# Patient Record
Sex: Male | Born: 1940 | Race: White | Hispanic: No | Marital: Married | Smoking: Former smoker
Health system: Southern US, Community
[De-identification: ages and names within clinical notes are randomized; demographics above are authoritative.]

## PROBLEM LIST (undated history)

## (undated) DIAGNOSIS — I10 Essential (primary) hypertension: Secondary | ICD-10-CM

## (undated) DIAGNOSIS — I2699 Other pulmonary embolism without acute cor pulmonale: Secondary | ICD-10-CM

## (undated) DIAGNOSIS — M1990 Unspecified osteoarthritis, unspecified site: Secondary | ICD-10-CM

## (undated) DIAGNOSIS — H9190 Unspecified hearing loss, unspecified ear: Secondary | ICD-10-CM

## (undated) DIAGNOSIS — F329 Major depressive disorder, single episode, unspecified: Secondary | ICD-10-CM

## (undated) DIAGNOSIS — F32A Depression, unspecified: Secondary | ICD-10-CM

## (undated) DIAGNOSIS — S82899A Other fracture of unspecified lower leg, initial encounter for closed fracture: Secondary | ICD-10-CM

## (undated) DIAGNOSIS — E785 Hyperlipidemia, unspecified: Secondary | ICD-10-CM

## (undated) HISTORY — DX: Hyperlipidemia, unspecified: E78.5

## (undated) HISTORY — DX: Other pulmonary embolism without acute cor pulmonale: I26.99

## (undated) HISTORY — DX: Essential (primary) hypertension: I10

## (undated) HISTORY — PX: TONSILLECTOMY: SUR1361

---

## 1999-03-14 ENCOUNTER — Encounter: Payer: Self-pay | Admitting: Family Medicine

## 1999-03-14 ENCOUNTER — Encounter: Admission: RE | Admit: 1999-03-14 | Discharge: 1999-03-14 | Payer: Self-pay | Admitting: Family Medicine

## 2002-09-30 ENCOUNTER — Encounter: Admission: RE | Admit: 2002-09-30 | Discharge: 2002-09-30 | Payer: Self-pay | Admitting: Family Medicine

## 2002-09-30 ENCOUNTER — Encounter: Payer: Self-pay | Admitting: Family Medicine

## 2003-03-03 ENCOUNTER — Ambulatory Visit (HOSPITAL_COMMUNITY): Admission: RE | Admit: 2003-03-03 | Discharge: 2003-03-03 | Payer: Self-pay | Admitting: Gastroenterology

## 2004-02-06 DIAGNOSIS — S82899A Other fracture of unspecified lower leg, initial encounter for closed fracture: Secondary | ICD-10-CM

## 2004-02-06 HISTORY — DX: Other fracture of unspecified lower leg, initial encounter for closed fracture: S82.899A

## 2004-08-14 ENCOUNTER — Emergency Department (HOSPITAL_COMMUNITY): Admission: EM | Admit: 2004-08-14 | Discharge: 2004-08-14 | Payer: Self-pay | Admitting: Family Medicine

## 2004-10-02 ENCOUNTER — Encounter: Admission: RE | Admit: 2004-10-02 | Discharge: 2004-10-02 | Payer: Self-pay | Admitting: Family Medicine

## 2004-10-02 ENCOUNTER — Observation Stay (HOSPITAL_COMMUNITY): Admission: EM | Admit: 2004-10-02 | Discharge: 2004-10-03 | Payer: Self-pay | Admitting: *Deleted

## 2005-01-05 ENCOUNTER — Ambulatory Visit: Payer: Self-pay | Admitting: Sports Medicine

## 2005-07-17 ENCOUNTER — Ambulatory Visit: Payer: Self-pay | Admitting: Family Medicine

## 2005-10-09 ENCOUNTER — Ambulatory Visit: Payer: Self-pay | Admitting: Sports Medicine

## 2005-10-09 ENCOUNTER — Encounter: Admission: RE | Admit: 2005-10-09 | Discharge: 2005-10-09 | Payer: Self-pay | Admitting: Sports Medicine

## 2005-11-20 ENCOUNTER — Ambulatory Visit: Payer: Self-pay | Admitting: Sports Medicine

## 2006-01-18 ENCOUNTER — Ambulatory Visit: Payer: Self-pay | Admitting: Oncology

## 2006-02-08 ENCOUNTER — Ambulatory Visit: Payer: Self-pay | Admitting: Sports Medicine

## 2006-03-04 ENCOUNTER — Ambulatory Visit: Payer: Self-pay | Admitting: Oncology

## 2006-03-07 LAB — CBC WITH DIFFERENTIAL/PLATELET
Eosinophils Absolute: 0.1 10*3/uL (ref 0.0–0.5)
MCV: 87.2 fL (ref 81.6–98.0)
MONO%: 7.1 % (ref 0.0–13.0)
NEUT#: 3.9 10*3/uL (ref 1.5–6.5)
RBC: 4.52 10*6/uL (ref 4.20–5.71)
RDW: 11.2 % (ref 11.2–14.6)
WBC: 6.8 10*3/uL (ref 4.0–10.0)
lymph#: 2.3 10*3/uL (ref 0.9–3.3)

## 2006-03-12 LAB — COMPREHENSIVE METABOLIC PANEL
AST: 22 U/L (ref 0–37)
Albumin: 4.3 g/dL (ref 3.5–5.2)
Alkaline Phosphatase: 61 U/L (ref 39–117)
Glucose, Bld: 138 mg/dL — ABNORMAL HIGH (ref 70–99)
Potassium: 3.9 mEq/L (ref 3.5–5.3)
Sodium: 142 mEq/L (ref 135–145)
Total Protein: 7 g/dL (ref 6.0–8.3)

## 2006-03-12 LAB — HYPERCOAGULABLE PANEL, COMPREHENSIVE
Beta-2 Glyco I IgG: <4 U/mL (ref <20)
DRVVT 1:1 Mix: 41.5 secs (ref 31.9–44.2)
Homocysteine: 8.4 umol/L (ref 4.0–15.4)
PTT Lupus Anticoagulant: 49.5 secs — ABNORMAL HIGH (ref 36.3–48.8)
Protein C Activity: 122 % (ref 91–147)
Protein C, Total: 99 % (ref 70–140)
Protein S Ag, Total: 129 % (ref 70–140)

## 2006-07-10 ENCOUNTER — Ambulatory Visit: Payer: Self-pay | Admitting: Sports Medicine

## 2006-07-10 DIAGNOSIS — M169 Osteoarthritis of hip, unspecified: Secondary | ICD-10-CM

## 2007-02-21 ENCOUNTER — Encounter: Admission: RE | Admit: 2007-02-21 | Discharge: 2007-02-21 | Payer: Self-pay | Admitting: Family Medicine

## 2007-04-30 ENCOUNTER — Ambulatory Visit: Payer: Self-pay | Admitting: Sports Medicine

## 2007-04-30 DIAGNOSIS — M25569 Pain in unspecified knee: Secondary | ICD-10-CM

## 2008-04-27 ENCOUNTER — Ambulatory Visit: Payer: Self-pay | Admitting: Sports Medicine

## 2008-12-14 ENCOUNTER — Ambulatory Visit: Payer: Self-pay | Admitting: Sports Medicine

## 2008-12-14 DIAGNOSIS — M25519 Pain in unspecified shoulder: Secondary | ICD-10-CM | POA: Insufficient documentation

## 2008-12-14 DIAGNOSIS — M67919 Unspecified disorder of synovium and tendon, unspecified shoulder: Secondary | ICD-10-CM | POA: Insufficient documentation

## 2008-12-14 DIAGNOSIS — M719 Bursopathy, unspecified: Secondary | ICD-10-CM

## 2009-01-11 ENCOUNTER — Ambulatory Visit: Payer: Self-pay | Admitting: Sports Medicine

## 2009-01-11 DIAGNOSIS — M75120 Complete rotator cuff tear or rupture of unspecified shoulder, not specified as traumatic: Secondary | ICD-10-CM

## 2009-02-22 ENCOUNTER — Ambulatory Visit: Payer: Self-pay | Admitting: Sports Medicine

## 2009-04-26 ENCOUNTER — Ambulatory Visit: Payer: Self-pay | Admitting: Sports Medicine

## 2009-09-14 ENCOUNTER — Ambulatory Visit: Payer: Self-pay | Admitting: Sports Medicine

## 2009-11-17 ENCOUNTER — Encounter: Admission: RE | Admit: 2009-11-17 | Discharge: 2009-11-17 | Payer: Self-pay | Admitting: Family Medicine

## 2010-03-07 NOTE — Assessment & Plan Note (Signed)
Summary: f/u,mc   Vital Signs:  Patient profile:   70 year old male Height:      70 inches Weight:      193 pounds BMI:     27.79 BP sitting:   149 / 83  Vitals Entered By: Lillia Pauls CMA (February 22, 2009 9:04 AM)   History of Present Illness: Clearly has continued to make progress With 2 to 3 tramadol per day pain is controlled to where he does his actiivty fine elevatin of shoulder has returned without pain one persistent area of pain and weakness is trying to reach behind his back  note scan showed Supspin tear no new sxs sleeps thru nite  Allergies (verified): No Known Drug Allergies  Physical Exam  General:  Well-developed,well-nourished,in no acute distress; alert,appropriate and cooperative throughout examination Msk:  Inspection reveals no abnormalities or assymetry; no atrophy noted; palpation is unremarkable;   ROM is full in all planes with exception of back scratch which is to T12 RT and T 7 left feels pain on ant shoulder with this . specific strength testing of Rotator cuff mm reveals good strength throughout; evenwith pushoff although this is painful somewhat no signs of impingement although with resistance he feels mild pain   speeds and yergason's tests normal;  no labral pathology noted; norm scapular function observed.  negative painful arc and no drop arm sign.    Impression & Recommendations:  Problem # 1:  SHOULDER PAIN, RIGHT (ICD-719.41)  His updated medication list for this problem includes:    Tramadol Hcl 50 Mg Tabs (Tramadol hcl) .Marland Kitchen... 1 by mouth qid    Mobic 15 Mg Tabs (Meloxicam) .Marland Kitchen... 1 by mouth qd    Anacin 81 Mg Tbec (Aspirin) .Marland Kitchen... 1 by mouth qd  improved and usually gets by with 2 tramadol daily but better with 3  Problem # 2:  ROTATOR CUFF TEAR (ICD-727.61) functionally making a good recovery no sign of frozen shoulder on exam will add therabean exercises with stand RC protocol do these consistently  will reck shoulder  and repeat US in March to April to see if tear is stable  Complete Medication List: 1)  Tramadol Hcl 50 Mg Tabs (Tramadol hcl) .Marland Kitchen.. 1 by mouth qid 2)  Mobic 15 Mg Tabs (Meloxicam) .Marland Kitchen.. 1 by mouth qd 3)  Simvastatin 40 Mg Tabs (Simvastatin) .Marland Kitchen.. 1 by mouth qhs 4)  Fish Oil 1000 Mg Caps (Omega-3 fatty acids) .Marland Kitchen.. 1 by mouth bid 5)  Anacin 81 Mg Tbec (Aspirin) .Marland Kitchen.. 1 by mouth qd 6)  Multivitamins Tabs (Multiple vitamin) .Marland Kitchen.. 1 by mouth qd

## 2010-03-07 NOTE — Assessment & Plan Note (Signed)
Summary: FU SHOULDER/MJD   Vital Signs:  Patient profile:   70 year old male BP sitting:   143 / 88  Vitals Entered By: Lillia Pauls CMA (September 14, 2009 9:05 AM)  History of Present Illness: 70 yo M f/u R shoudler pain and RC tear. Tear previously seen on Korea at supraspintus (well calcified last US exam), Subscap (well healed last Korea), and infraspinatus (some persistence). States overall doing ok, has good and bad days. Slacking off on doing shoulder exercises. Reaching back still most painful, but better. Overall, pain much better than 1 year ago when first had onset of pain. Still using his arms/shoudlers alot and feels he has good strength. L shoulder hurting some as well. Using meloxicam qd and tramadol bid-tid.  Pain better when does tramadol three times a day. Able lay on that side.   Allergies: No Known Drug Allergies   Shoulder/Elbow Exam  General:    Well-developed, well-nourished, normal body habitus; no deformities, normal grooming.    Shoulder Exam:    Shoulder: Inspection reveals no abnormalities, atrophy or asymmetry. Palpation is normal with no tenderness over AC joint or bicipital groove. ROM is full in all planes except IR where he gets to L4-5 on L shoulder, as opposed to T12 on R shoulder. Rotator cuff strength normal throughout. No signs of impingement with negative Neer and Hawkin's tests, empty can. Speeds and Yergason's tests normal. No labral pathology noted with negative Obrien's, negative clunk and good stability. Normal scapular function observed. No painful arc and no drop arm sign. No apprehension sign   MSK limited  US L shoulder: Supraspinatus with good calcification and no impingement under acromion.  Subscap with mild edema, but no large tear or impingement on corocoid.  Infraspinatus with much smaller tear down to 0.39 cm length (prev 0.9 cm), still with some persistent fluid accumulation/bursitis.   Impression &  Recommendations:  Problem # 1:  ROTATOR CUFF TEAR (ICD-727.61) Assessment Improved  Overall, patient much improved clinically and on Korea - continue meloxicam and tramadol - continue home RC stretches and strengthening exercises, specifically discussed crossover stretch to help with decreased IR ROM - RTC as needed for CSI if desired or if much increased weakness  Time spent >25 minutes in direct care of this patient, over half in regards to counseling and ultrasounding  Orders: [place] LIMITED ([id number])  Complete Medication List: 1)  Tramadol Hcl 50 Mg Tabs (Tramadol hcl) .Marland Kitchen.. 1 by mouth qid 2)  Mobic 15 Mg Tabs (Meloxicam) .Marland Kitchen.. 1 by mouth qd 3)  Simvastatin 40 Mg Tabs (Simvastatin) .Marland Kitchen.. 1 by mouth qhs 4)  Fish Oil 1000 Mg Caps (Omega-3 fatty acids) .Marland Kitchen.. 1 by mouth bid 5)  Anacin 81 Mg Tbec (Aspirin) .Marland Kitchen.. 1 by mouth qd 6)  Multivitamins Tabs (Multiple vitamin) .Marland Kitchen.. 1 by mouth qd

## 2010-03-07 NOTE — Assessment & Plan Note (Signed)
Summary: U/S SHOULDER,MC   Vital Signs:  Patient profile:   70 year old male BP sitting:   140 / 74  Vitals Entered By: Enid Baas MD (April 26, 2009 10:58 AM)  History of Present Illness: follow of Rotator cuff tear this was mostly in supraspinatus - 2cms 1 cm tear in infraspinatus  has done exercises most days but not on hard work days using hammer sometimes hurts in harder positions  pain no longer at nite pain is less with almost all motions now pain and aching seems similar in both shoulders and no real difference in strength  Allergies: No Known Drug Allergies  Physical Exam  General:  Well-developed,well-nourished,in no acute distress; alert,appropriate and cooperative throughout examination Msk:  Inspection reveals no abnormalities or assymetry; no atrophy noted; palpation is unremarkable;  ROM is full in all planes. specific strength testing of Rotator cuff mm reveals good strength throughout; no signs of impingement; speeds and yergason's tests normal;  no labral pathology noted; norm scapular function observed.  negative painful arc and no drop arm sign.  only remaining change is back scratch is 2 vertebrae less on RT but 2 better than last exam  strg better on IR than before  Additional Exam:  MSK Korea now on scanning the tear in the supraspinatus looks healed and the area of tear is now calcified no impingement noted on testing subscapularis shows some edema, maybe some slt tear inferiorly no impingement and 90% intact infraspinatus shows that tear persists and is about same - measures 0.9cms today  images saved   Impression & Recommendations:  Problem # 1:  ROTATOR CUFF TEAR (ICD-727.61) Assessment Improved  this is definitely improving with conservative care supraspin has healed would like to reck in 3 mos and see if infraspinatus heals  cont exercises focus on ER  Orders: Korea LIMITED ([id number])  Problem # 2:  SHOULDER PAIN, RIGHT  (ICD-719.41) Assessment: Improved  His updated medication list for this problem includes:    Tramadol Hcl 50 Mg Tabs (Tramadol hcl) .Marland Kitchen... 1 by mouth qid    Mobic 15 Mg Tabs (Meloxicam) .Marland Kitchen... 1 by mouth qd    Anacin 81 Mg Tbec (Aspirin) .Marland Kitchen... 1 by mouth once daily  much better cont meds as before if needed  Orders: Korea LIMITED ([id number])  Complete Medication List: 1)  Tramadol Hcl 50 Mg Tabs (Tramadol hcl) .Marland Kitchen.. 1 by mouth qid 2)  Mobic 15 Mg Tabs (Meloxicam) .Marland Kitchen.. 1 by mouth qd 3)  Simvastatin 40 Mg Tabs (Simvastatin) .Marland Kitchen.. 1 by mouth qhs 4)  Fish Oil 1000 Mg Caps (Omega-3 fatty acids) .Marland Kitchen.. 1 by mouth bid 5)  Anacin 81 Mg Tbec (Aspirin) .Marland Kitchen.. 1 by mouth qd 6)  Multivitamins Tabs (Multiple vitamin) .Marland Kitchen.. 1 by mouth qd

## 2010-06-23 NOTE — H&P (Signed)
Luis Cisneros, STAY NO.:  [id number]   MEDICAL RECORD NO.:  [id number]          PATIENT TYPE:  OBV   LOCATION:  1832                         FACILITY:  [facility]   PHYSICIAN:  Lonia Blood, M.D.       DATE OF BIRTH:  [birth year]   DATE OF ADMISSION:  10/02/2004  DATE OF DISCHARGE:                                HISTORY & PHYSICAL   CHIEF COMPLAINT:  Weakness, shortness of breath, hemoptysis.   HISTORY OF PRESENT ILLNESS:  Mr. Soulier is a 70 year old Caucasian man with  history of hypercholesterolemia who had nondisplaced fracture of the lateral  malleolus several weeks ago.  His leg was placed in a boot, and he was less  mobile over the preceding weeks.  About a week ago, he started having  weakness and felt some shortness of breath.  He also felt pain in the chest,  in the left area every time he would take a deep breath. He also has been  having a dry cough over the past week.  On the morning of admission, he woke  up coughing blood, and he went to see his primary care physician.  A  computed tomography of his chest was obtained which showed bilateral  pulmonary emboli.  Currently Mr. Rudden denies any active chest pain or  shortness of breath.   PAST MEDICAL HISTORY:  Significant for hypercholesterolemia.  No past  surgical history.  No personal history of DVT or PE.   SOCIAL HISTORY:  He is married, and he works as an Technical sales engineer.  He drinks  alcohol occasionally, 1 drink a day.  Smoked cigarettes a long time ago but  quit 20 years ago.  He is married, has a boy and girl.   FAMILY HISTORY:  His father died at age 26.  Mother died at age 38.  Both  had hypertension.  He has 2 sister that have high cholesterol, and both of  his children have high cholesterol.   REVIEW OF SYSTEMS:  GENERAL:  Denies any fever or chills.  Denies any  appetite changes.  EYES:  Denies any vision changes or pain.  EAR, NOSE,  THROAT: Denies nose bleed, sore throat. CARDIOVASCULAR:  As  per HPI.  RESPIRATORY:  As per HPI.  GASTROINTESTINAL: Denies any nausea, vomiting,  diarrhea, constipation, abdominal pain, or any melena, hematochezia, or  hematemesis.  GENITOURINARY:  Denies any dysuria, hematuria, flank pain, or  incontinence.  MUSCULOSKELETAL: Denies any muscle pain.  Reports back pain  as per HPI.  SKIN:  Denies any petechiae, rashes, itching, pallor, or  bruises.  NEUROLOGIC:  Denies any weakness, numbness, vertigo, walking or  speech disturbances.  ENDOCRINE:  Denies any polyuria or polydipsia, heat or  cold intolerance. LYMPHADENOPATHY:  There is no lymphadenopathy.  PSYCHIATRIC:  Denies depression, anxiety, hallucinations, suicide ideation.   ALLERGIES:  The patient has no known drug allergies.   PHYSICAL EXAMINATION:  VITAL SIGNS:  Temperature 99.1, blood pressure  152/90, heart rate 93, respirations 20, saturating 92% on room air.  GENERAL:  The patient is comfortable, sitting in bed  in no acute distress.  He is alert and oriented to place, person, and time.  He is robust.  EYES:  Pupils equal, round, and reactive to light and accommodation.  Extraocular movements intact.  Conjunctivae pink.  EAR, NOSE, THROAT:  He has intact hearing.  Clear pharynx.  No ulceration in  the mouth, good dentition.  NECK:  No JVD, no carotid bruits.  Trachea is in the midline.  No  thyromegaly.  RESPIRATORY:  He has good effort.  There are some crackles at the left base  on auscultation.  There were no wheezes and no rhonchi.  CARDIOVASCULAR:  Regular rate and rhythm without murmurs, rubs, or gallops.  On the right lower extremity, there is +2 edema that is pitting.  GASTROINTESTINAL:  Abdomen is soft nontender, nondistended.  Bowel sounds  are present.  There is no hepatosplenomegaly.  MUSCULOSKELETAL:  Right medial malleolus has intact skin, appears the  fracture has healed well.  Otherwise, muscle mass appears well distributed  with intact tone and bulk.  SKIN:  No rashes  on inspection.  NEUROLOGIC:  Cranial nerves II-XII intact.  Deep tendon reflexes symmetric.  Sensation is intact.  PSYCHIATRIC:  He has good insight, orientation, memory, and affect.   LABORATORY DATA:  Pending at time of this dictation.   Chest CT shows bilateral lower lobe pulmonary emboli.   ASSESSMENT AND PLAN:  Pulmonary emboli.  Given the fact that this is a  submassive pulmonary emboli and also the fact that the patient has  hemoptysis, we will start with heparin overnight and oral Coumadin.  The  plan is to convert in the morning to Lovenox maybe at home together with  Coumadin.  Arrangements will have to be made with Dr. Susann Givens about  monitoring the Coumadin.  Also, the patient will have to be monitored on  telemetry for 24 hours to assure stability.           ______________________________  Lonia Blood, M.D.     SL/MEDQ  D:  10/02/2004  T:  10/02/2004  Job:  [id number]   cc:   Sharlot Gowda, M.D.  [address]  [place], [place] [address]  Fax: [phone]

## 2010-06-23 NOTE — Op Note (Signed)
NAME:  Luis Cisneros, Luis Cisneros                           ACCOUNT NO.:  [id number]   MEDICAL RECORD NO.:  [id number]                   PATIENT TYPE:  AMB   LOCATION:  ENDO                                 FACILITY:  [facility]   PHYSICIAN:  Anselmo Rod, M.D.               DATE OF BIRTH:  [birth year]   DATE OF PROCEDURE:  03/03/2003  DATE OF DISCHARGE:                                 OPERATIVE REPORT   PROCEDURE PERFORMED:  Screening colonoscopy.   ENDOSCOPIST:  Charna Elizabeth, M.D.   INSTRUMENT USED:  Olympus video colonoscope.   INDICATIONS FOR PROCEDURE:  The patient is a 70 year old white male with a  history of mild anemia and occasional bright red blood per rectum.  Rule out  colonic polyps, masses, etc.   PREPROCEDURE PREPARATION:  Informed consent was procured from the patient.  The patient was fasted for eight hours prior to the procedure and prepped  with a bottle of magnesium citrate and a gallon of GoLYTELY the night prior  to the procedure.   PREPROCEDURE PHYSICAL:  The patient had stable vital signs.  Neck supple.  Chest clear to auscultation.  S1 and S2 regular.  Abdomen soft with normal  bowel sounds.   DESCRIPTION OF PROCEDURE:  The patient was placed in left lateral decubitus  position and sedated with 100 mg of Demerol and 10 mg of Versed  intravenously.  Once the patient was adequately sedated and maintained on  low flow oxygen and continuous cardiac monitoring, the Olympus video  colonoscope was advanced from the rectum to the cecum and terminal ileum  without difficulty.  The appendicular orifice and ileocecal valve were  clearly visualized and photographed.  No masses, polyps, erosions,  ulcerations or diverticula were seen.  Small internal hemorrhoids were seen  on retroflexion in the rectum.  The patient tolerated the procedure well  without complication.   IMPRESSION:  Normal colonoscopy up to the terminal ileum except for small  internal hemorrhoids.   RECOMMENDATIONS:  1. Continue high fiber diet with liberal fluid intake.  2. Outpatient followup in the next two weeks to check the CBC.  3. Repeat colorectal cancer screening is recommended in the next 10 years     unless the patient develops any abnormal symptoms in the interim.     Possible upper GI with small bowel follow-through if symptoms persist.                                               Anselmo Rod, M.D.    JNM/MEDQ  D:  03/03/2003  T:  03/03/2003  Job:  [id number]   cc:   Talmadge Coventry, M.D.  [address]  [place]  [place] [address]  Fax: [phone]

## 2010-06-23 NOTE — Discharge Summary (Signed)
NAMEVERL, KITSON                 ACCOUNT NO.:  [id number]   MEDICAL RECORD NO.:  [id number]          PATIENT TYPE:  OBV   LOCATION:  5501                         FACILITY:  [facility]   PHYSICIAN:  Renato Battles, M.D.     DATE OF BIRTH:  15-Jan-1941   DATE OF ADMISSION:  10/02/2004  DATE OF DISCHARGE:  10/03/2004                                 DISCHARGE SUMMARY   REASON FOR ADMISSION:  Shortness of breath and hemoptysis.   DISCHARGE DIAGNOSES:  1.  Bilateral lower lobe pulmonary emboli.  2.  Initiation of Coumadin anticoagulation overlapped with Lovenox.  3.  Hyperlipidemia.   DISCHARGE MEDICATIONS:  1.  Lovenox 70 mg subcutaneous q.12h.  2.  Coumadin 5 mg p.o. daily.  3.  Lovastatin 80 mg p.o. daily.  4.  Daily multivitamins.  5.  Aspirin 81 mg p.o. daily.   CONSULTS:  None except for pharmacy.   PROCEDURES:  Chest CTA on August 28 showed pulmonary emboli bilaterally in  lower lobes with left lower lobe opacity and effusion suspicious for  infarction and also 7 mm noncalcified nodule in right middle lobe and right  adrenal myelolipoma with calcifications.   HISTORY AND PHYSICAL AND HOSPITAL COURSE:  The patient is a very pleasant 70-  year-old white male who has no other medical history except for  hypercholesterolemia.  He had nondisplaced fracture of the lateral  __________ bone a few weeks back for which his leg was placed in a boot cast  and limited is mobility.  For a week prior to admission he developed some  weakness and cough, shortness of breath, and hemoptysis in addition to left  posterior chest pain.  Went to see his primary care physician who obtained a  CT angiogram and that showed bilateral PEs as described above.  Patient  wanted to be treated as outpatient using Lovenox.  He certainly was felt to  be educated and attentive enough to do so.  However, given the magnitude of  the emboli we elected to keep him in the hospital for 24 hours for  observation to ensure  stability.  Patient was started on heparin drip as  well as Coumadin 7.5 mg given one dose on the evening of 28th.  He is going  to be instructed on how to give himself Lovenox shots and his plan is to  discharge him home on b.i.d. Lovenox 1 mg/kg to overlap with Coumadin daily  and Dr. Susann Givens will be the one to follow his coagulation studies.   DISCHARGE INSTRUCTIONS:   ACTIVITY:  As tolerated.   DIET:  Low fat.   FOLLOW-UP:  Follow up with Dr. Susann Givens this Friday, September 1.           ______________________________  Renato Battles, M.D.     SA/MEDQ  D:  10/03/2004  T:  10/03/2004  Job:  [id number]   cc:   Talmadge Coventry, M.D.  [address]  [place]  [place] [address]  Fax: [phone]   Sharlot Gowda, M.D.  [address]  [place], [place] [address]  Fax: [phone]

## 2010-09-25 ENCOUNTER — Other Ambulatory Visit: Payer: Self-pay | Admitting: Sports Medicine

## 2010-10-13 ENCOUNTER — Encounter: Payer: Self-pay | Admitting: Internal Medicine

## 2010-10-17 ENCOUNTER — Encounter: Payer: Self-pay | Admitting: Internal Medicine

## 2010-10-17 ENCOUNTER — Ambulatory Visit (INDEPENDENT_AMBULATORY_CARE_PROVIDER_SITE_OTHER): Payer: Medicare Other | Admitting: Sports Medicine

## 2010-10-17 ENCOUNTER — Ambulatory Visit (INDEPENDENT_AMBULATORY_CARE_PROVIDER_SITE_OTHER): Payer: Medicare Other | Admitting: Internal Medicine

## 2010-10-17 VITALS — BP 132/62 | HR 92 | Temp 98.5°F | Ht 68.5 in | Wt 186.0 lb

## 2010-10-17 VITALS — BP 140/70

## 2010-10-17 DIAGNOSIS — M169 Osteoarthritis of hip, unspecified: Secondary | ICD-10-CM

## 2010-10-17 DIAGNOSIS — F338 Other recurrent depressive disorders: Secondary | ICD-10-CM

## 2010-10-17 DIAGNOSIS — Z23 Encounter for immunization: Secondary | ICD-10-CM

## 2010-10-17 DIAGNOSIS — M25569 Pain in unspecified knee: Secondary | ICD-10-CM

## 2010-10-17 DIAGNOSIS — F39 Unspecified mood [affective] disorder: Secondary | ICD-10-CM

## 2010-10-17 DIAGNOSIS — E785 Hyperlipidemia, unspecified: Secondary | ICD-10-CM

## 2010-10-17 DIAGNOSIS — M75120 Complete rotator cuff tear or rupture of unspecified shoulder, not specified as traumatic: Secondary | ICD-10-CM

## 2010-10-17 MED ORDER — MELOXICAM 15 MG PO TABS: 15.0000 mg | ORAL_TABLET | Freq: Every day | ORAL | Status: DC

## 2010-10-17 MED ORDER — TRAMADOL HCL 50 MG PO TABS: 50.0000 mg | ORAL_TABLET | Freq: Four times a day (QID) | ORAL | Status: DC | PRN

## 2010-10-17 NOTE — Progress Notes (Signed)
  Subjective:    Patient ID: Luis Cisneros, male    DOB: Jun 08, 1940, 70 y.o.   MRN: [id number]  HPI 70 yo male presents for hip and knee pain.  He has known chronic left hip pain and bilateral knee pain. However, recently he has a new onset of worsening right hip stiffness that occurred after tripping. There was no swelling or echymosis.  He describes worsening stiffness of the knees and hips that occur in the morning and last less than 1 hour. He does take tramadol, which helps with the pain. He often takes 2 tabs per day, but if needed will take 3 tabs which helps. He continues to take his Mobic every morning  Review of Systems     Objective:   Physical Exam There are no obvious deformities of the hips and knees. 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 OA May increase tramadol to 3-4 tabs per day as needed for pain Discussed medication management and may need lifestyle changes to help with symptoms, however he may need a hip replacement in the future

## 2010-10-17 NOTE — Assessment & Plan Note (Signed)
Knees at the present time are stable  I think he gets some increased knee pain because of the hip issues

## 2010-10-17 NOTE — Assessment & Plan Note (Signed)
This does appear to have healed with excellent strength on the right on abduction and elevation.  Empty can test was negative  He can expect to have some pain with overhead work because of scar tissue

## 2010-10-17 NOTE — Assessment & Plan Note (Signed)
He is showing progressive worsening of the osteoarthritis of his left hip and the right is about the same. However on hip flexion he has also lost a lot of flexion on the right and left at this point  I encouraged him to continue the tramadol 3 times a day and continue the Mobic If necessary we will add amitriptyline or a muscle relaxant at night  He will check with me in 3-4

## 2010-10-24 ENCOUNTER — Telehealth: Payer: Self-pay | Admitting: Internal Medicine

## 2010-10-24 NOTE — Telephone Encounter (Signed)
Rx provided for Zostavax 

## 2010-10-27 ENCOUNTER — Ambulatory Visit: Payer: Medicare Other | Admitting: Internal Medicine

## 2010-11-01 DIAGNOSIS — E785 Hyperlipidemia, unspecified: Secondary | ICD-10-CM | POA: Insufficient documentation

## 2010-11-01 DIAGNOSIS — F338 Other recurrent depressive disorders: Secondary | ICD-10-CM | POA: Insufficient documentation

## 2010-11-01 NOTE — Progress Notes (Signed)
  Subjective:    Patient ID: Luis Cisneros, male    DOB: [birth year], 70 y.o.   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 Patient has history of hyperlipidemia, vitamin D deficiency, erectile dysfunction. Quit smoking over 20 years ago. Had colonoscopy 2005 by Dr. Loreta Ave. History of pulmonary embolus after surgery for leg fracture in 2006. History of calcified granuloma right middle lobe. Tonsillectomy 1948. Intolerant to Feldene causes nausea. Intolerant of Keflex.  Takes meloxicam 15 mg daily, tramadol 50 mg twice daily, Zocor generic 40 mg daily, 2000 units vitamin D 3 daily, 81 mg aspirin daily.  Last tetanus immunization 2008.  Patient consumes alcohol socially usually 1 glass of wine, 1 drink of bourbon or 1 shot of tequila daily. He is self-employed. Tramadol and Mobic or for arthritis. Has history of seasonal affect disorder. Says he would like to be on something for that.  Family history remarkable for hypertension and coronary artery disease in his father. Hypertension in his mother.  Patient has 2 adult children from previous marriage one age 61 a son who is overweight with thyroid disorder and a daughter age 18 that is overweight. Mother died at age 26 of an abdominal aortic aneurysm. Father died at age 94 with cancer and aspiration. 2 sisters with arthritis      Review of Systems  Constitutional: Negative.   HENT: Negative.   Eyes: Negative.   Respiratory: Negative.   Cardiovascular: Negative.   Gastrointestinal: Negative.   Musculoskeletal: Positive for back pain and arthralgias.  Neurological: Negative.   Hematological: Negative.   Psychiatric/Behavioral: Positive for dysphoric mood.       Objective:   Physical Exam  Vitals reviewed. Constitutional: He is oriented to person, place, and time. He appears well-nourished.  HENT:  Head: Normocephalic and atraumatic.  Right Ear: External ear normal.    Left Ear: External ear normal.  Mouth/Throat: Oropharynx is clear and moist.  Eyes: Pupils are equal, round, and reactive to light. Right eye exhibits no discharge. Left eye exhibits no discharge. No scleral icterus.  Neck: Neck supple. No JVD present. No thyromegaly present.  Cardiovascular: Normal rate, regular rhythm, normal heart sounds and intact distal pulses.   No murmur heard. Pulmonary/Chest: Effort normal and breath sounds normal. No respiratory distress. He has no wheezes.  Abdominal: Soft. Bowel sounds are normal. He exhibits no mass. There is no rebound and no guarding.  Genitourinary: Prostate normal.  Musculoskeletal: Normal range of motion. He exhibits no edema.  Lymphadenopathy:    He has no cervical adenopathy.  Neurological: He is alert and oriented to person, place, and time. He has normal reflexes. Coordination normal.  Psychiatric: He has a normal mood and affect. His behavior is normal. Judgment and thought content normal.          Assessment & Plan:  Musculoskeletal pain  Hyperlipidemia  History of seasonal affect disorder  Try Wellbutrin XL 150 mg daily increasing to 300 mg daily if necessary.

## 2010-11-14 ENCOUNTER — Encounter: Payer: Self-pay | Admitting: Internal Medicine

## 2010-12-18 ENCOUNTER — Other Ambulatory Visit: Payer: Self-pay

## 2010-12-18 MED ORDER — SIMVASTATIN 40 MG PO TABS: 40.0000 mg | ORAL_TABLET | Freq: Every day | ORAL | Status: DC

## 2010-12-18 NOTE — Telephone Encounter (Signed)
Message left for patient to call back re: CPE labs, as simvastatin was refilled today without labs  Since 10/21/2009.

## 2010-12-21 ENCOUNTER — Telehealth: Payer: Self-pay

## 2010-12-21 ENCOUNTER — Other Ambulatory Visit: Payer: Medicare Other | Admitting: Internal Medicine

## 2010-12-21 DIAGNOSIS — E785 Hyperlipidemia, unspecified: Secondary | ICD-10-CM

## 2010-12-21 LAB — COMPREHENSIVE METABOLIC PANEL
AST: 28 U/L (ref 0–37)
Albumin: 4.3 g/dL (ref 3.5–5.2)
Alkaline Phosphatase: 67 U/L (ref 39–117)
BUN: 20 mg/dL (ref 6–23)
Potassium: 5 mEq/L (ref 3.5–5.3)
Sodium: 139 mEq/L (ref 135–145)
Total Bilirubin: 0.6 mg/dL (ref 0.3–1.2)

## 2010-12-21 LAB — CBC WITH DIFFERENTIAL/PLATELET
Basophils Absolute: 0 10*3/uL (ref 0.0–0.1)
Basophils Relative: 0 % (ref 0–1)
MCHC: 31.9 g/dL (ref 30.0–36.0)
Monocytes Absolute: 0.6 10*3/uL (ref 0.1–1.0)
Neutro Abs: 2.7 10*3/uL (ref 1.7–7.7)
Neutrophils Relative %: 52 % (ref 43–77)
RDW: 13 % (ref 11.5–15.5)

## 2010-12-21 LAB — LIPID PANEL
HDL: 77 mg/dL (ref >39)
LDL Cholesterol: 110 mg/dL — ABNORMAL HIGH (ref 0–99)
VLDL: 19 mg/dL (ref 0–40)

## 2010-12-21 MED ORDER — SILDENAFIL CITRATE 100 MG PO TABS: 100.0000 mg | ORAL_TABLET | ORAL | Status: DC | PRN

## 2010-12-21 NOTE — Telephone Encounter (Signed)
rx mailed to patient, as requested

## 2010-12-21 NOTE — Telephone Encounter (Signed)
Patient requesting written rx for Viagra 50 mg, I think mailed to him

## 2010-12-22 LAB — PSA: PSA: 0.88 ng/mL (ref <=4.00)

## 2011-02-01 ENCOUNTER — Other Ambulatory Visit: Payer: Self-pay | Admitting: Sports Medicine

## 2011-02-15 ENCOUNTER — Other Ambulatory Visit: Payer: Self-pay | Admitting: Sports Medicine

## 2011-06-30 ENCOUNTER — Other Ambulatory Visit: Payer: Self-pay | Admitting: Sports Medicine

## 2011-07-19 ENCOUNTER — Other Ambulatory Visit: Payer: Self-pay | Admitting: Internal Medicine

## 2011-08-14 ENCOUNTER — Other Ambulatory Visit: Payer: Self-pay | Admitting: *Deleted

## 2011-08-14 ENCOUNTER — Other Ambulatory Visit: Payer: Self-pay

## 2011-08-14 MED ORDER — BUPROPION HCL 75 MG PO TABS: 75.0000 mg | ORAL_TABLET | Freq: Two times a day (BID) | ORAL | Status: DC

## 2011-08-14 MED ORDER — MELOXICAM 15 MG PO TABS: 15.0000 mg | ORAL_TABLET | Freq: Every day | ORAL | Status: DC

## 2011-10-18 ENCOUNTER — Other Ambulatory Visit: Payer: Medicare Other | Admitting: Internal Medicine

## 2011-10-18 DIAGNOSIS — E785 Hyperlipidemia, unspecified: Secondary | ICD-10-CM

## 2011-10-18 DIAGNOSIS — Z125 Encounter for screening for malignant neoplasm of prostate: Secondary | ICD-10-CM

## 2011-10-18 LAB — CBC WITH DIFFERENTIAL/PLATELET
Basophils Absolute: 0 10*3/uL (ref 0.0–0.1)
Basophils Relative: 0 % (ref 0–1)
Eosinophils Absolute: 0.2 10*3/uL (ref 0.0–0.7)
Hemoglobin: 14.7 g/dL (ref 13.0–17.0)
MCHC: 34 g/dL (ref 30.0–36.0)
Monocytes Relative: 13 % — ABNORMAL HIGH (ref 3–12)
Neutro Abs: 2.4 10*3/uL (ref 1.7–7.7)
Neutrophils Relative %: 49 % (ref 43–77)
Platelets: 297 10*3/uL (ref 150–400)
RDW: 13.8 % (ref 11.5–15.5)

## 2011-10-18 LAB — COMPREHENSIVE METABOLIC PANEL
ALT: 24 U/L (ref 0–53)
AST: 26 U/L (ref 0–37)
Albumin: 4.1 g/dL (ref 3.5–5.2)
Alkaline Phosphatase: 61 U/L (ref 39–117)
Potassium: 4.8 mEq/L (ref 3.5–5.3)
Sodium: 139 mEq/L (ref 135–145)
Total Bilirubin: 0.8 mg/dL (ref 0.3–1.2)
Total Protein: 6.7 g/dL (ref 6.0–8.3)

## 2011-10-18 LAB — LIPID PANEL
LDL Cholesterol: 79 mg/dL (ref 0–99)
VLDL: 13 mg/dL (ref 0–40)

## 2011-10-18 LAB — PSA, MEDICARE: PSA: 1.04 ng/mL (ref <=4.00)

## 2011-10-19 ENCOUNTER — Ambulatory Visit (INDEPENDENT_AMBULATORY_CARE_PROVIDER_SITE_OTHER): Payer: Medicare Other | Admitting: Internal Medicine

## 2011-10-19 ENCOUNTER — Encounter: Payer: Self-pay | Admitting: Internal Medicine

## 2011-10-19 VITALS — BP 138/82 | HR 68 | Temp 98.7°F | Ht 69.25 in | Wt 193.0 lb

## 2011-10-19 DIAGNOSIS — M7918 Myalgia, other site: Secondary | ICD-10-CM

## 2011-10-19 DIAGNOSIS — F338 Other recurrent depressive disorders: Secondary | ICD-10-CM

## 2011-10-19 DIAGNOSIS — Z86711 Personal history of pulmonary embolism: Secondary | ICD-10-CM

## 2011-10-19 DIAGNOSIS — Z8249 Family history of ischemic heart disease and other diseases of the circulatory system: Secondary | ICD-10-CM

## 2011-10-19 DIAGNOSIS — N529 Male erectile dysfunction, unspecified: Secondary | ICD-10-CM

## 2011-10-19 DIAGNOSIS — Z0000 Encounter for general adult medical examination without abnormal findings: Secondary | ICD-10-CM

## 2011-10-19 DIAGNOSIS — E785 Hyperlipidemia, unspecified: Secondary | ICD-10-CM

## 2011-10-19 LAB — POCT URINALYSIS DIPSTICK
Blood, UA: NEGATIVE
Glucose, UA: NEGATIVE
Nitrite, UA: NEGATIVE
Protein, UA: NEGATIVE
Urobilinogen, UA: NEGATIVE

## 2011-10-19 NOTE — Progress Notes (Signed)
Subjective:    Patient ID: Luis Cisneros, male    DOB: October 13, 1940, 71 y.o.   MRN: [id number]  HPI 71 year old white male in today for health maintenance exam and evaluation of hyperlipidemia and seasonal effective disorder. Patient had colonoscopy in 2005. History of erectile dysfunction for which he uses Viagra. History of musculoskeletal pain for which he takes Mobic. Takes Wellbutrin for mild depression and seasonal effective disorder. Occasionally takes tramadol for musculoskeletal pain.  History of pulmonary embolism status post surgery in 2006. History of calcified granuloma right middle lobe. Quit smoking over 20 years ago. Had CT of the chest October 2011 showing noncalcified nodule previously identified within the right middle lobe which was completely stable and considered benign. Also thought to have scarring in the posterior left lower lobe.  Also has history of impaired glucose tolerance by old records and vitamin D deficiency. Last hemoglobin A1c in 2011 was 5.7% which was considered to be normal. At one point he became anemic because of donating blood every 6 weeks in 2010. When he stopped donating blood his anemia improved. He was also placed on iron. Hemoglobin decreased to 12.6 g.  He had tetanus immunization March 2008. History of degenerative joint disease. History of cervical radiculopathy in 2001.  Had bone density study 2009 which was normal. CT angiogram in August 2006 showed bilateral pulmonary emboli to both lower lobes status post recent leg fracture.  Family history: Father died at age 75 due to lung infection and heart attack. Mother died at age 71 with aortic aneurysm rupture. 2 sisters in good health.  Social history: Does not smoke. Social alcohol consumption several times weekly. Stays physically active remodeling homes. Does take baby aspirin daily status post history of PE 2006.    Review of Systems  Constitutional: Negative.   HENT: Negative.   Eyes:  Negative.   Respiratory: Negative.   Cardiovascular: Negative.   Gastrointestinal: Negative.   Genitourinary:       ED  Musculoskeletal: Positive for myalgias and arthralgias.  Neurological: Negative.   Hematological: Negative.   Psychiatric/Behavioral:       History of seasonal affective disorder       Objective:   Physical Exam  Vitals reviewed. Constitutional: He is oriented to person, place, and time. He appears well-developed and well-nourished. No distress.  HENT:  Head: Normocephalic and atraumatic.  Right Ear: External ear normal.  Left Ear: External ear normal.  Mouth/Throat: Oropharynx is clear and moist.  Eyes: Conjunctivae normal and EOM are normal. Pupils are equal, round, and reactive to light. Right eye exhibits no discharge. Left eye exhibits no discharge. No scleral icterus.  Neck: Neck supple. No JVD present. No thyromegaly present.  Cardiovascular: Normal rate, regular rhythm, normal heart sounds and intact distal pulses.   No murmur heard. Pulmonary/Chest: Effort normal and breath sounds normal. He has no wheezes. He has no rales.  Abdominal: Soft. Bowel sounds are normal. He exhibits no distension and no mass. There is no tenderness. There is no rebound.  Genitourinary: Prostate normal.  Musculoskeletal: He exhibits no edema.  Lymphadenopathy:    He has no cervical adenopathy.  Neurological: He is alert and oriented to person, place, and time. He has normal reflexes. He displays normal reflexes. No cranial nerve deficit. Coordination normal.  Skin: Skin is warm and dry. No rash noted. He is not diaphoretic. No erythema. No pallor.  Psychiatric: He has a normal mood and affect. His behavior is normal. Judgment and thought content normal.  Assessment & Plan:   Subjective:   Patient presents for Medicare Annual/Subsequent preventive examination.   Review Past Medical/Family/Social:  See above   Risk Factors  Current exercise habits:  physically active remodeling homes Dietary issues discussed:   Cardiac risk factors:   Depression Screen  (Note: if answer to either of the following is "Yes", a more complete depression screening is indicated)  Over the past two weeks, have you felt down, depressed or hopeless? no Over the past two weeks, have you felt little interest or pleasure in doing things? no Have you lost interest or pleasure in daily life? no Do you often feel hopeless? no Do you cry easily over simple problems? no   Activities of Daily Living  In your present state of health, do you have any difficulty performing the following activities?:  Driving? No  Managing money? No  Feeding yourself? No  Getting from bed to chair? No  Climbing a flight of stairs? No  Preparing food and eating?: No  Bathing or showering? No  Getting dressed: No  Getting to the toilet? No  Using the toilet:No  Moving around from place to place: No  In the past year have you fallen or had a near fall?:No  Are you sexually active? yes Do you have more than one partner? No   Hearing Difficulties: No  Do you often ask people to speak up or repeat themselves? Yes hearing loss right ear- has hearing aid Do you experience ringing or noises in your ears? no  Do you have difficulty understanding soft or whispered voices? yes Do you feel that you have a problem with memory? Yes  Do you often misplace items? No  Do you feel safe at home? Yes   Cognitive Testing  Alert? Yes Normal Appearance?Yes  Oriented to person? Yes Place? Yes  Time? Yes  Recall of three objects? Yes  Can perform simple calculations? Yes  Displays appropriate judgment?Yes  Can read the correct time from a watch face?Yes   List the Names of Other Physician/Practitioners you currently use: optometrist   Indicate any recent Medical Services you may have received from other than [facility] providers in the past year (date may be approximate).   Screening Tests /  Date Colonoscopy done   2005                Zostavax -done Mammogram not applicable  Influenza Vaccine -declined Tetanus/tdap- done in 2008 by old records    Objective:        Head: Normocephalic, without obvious abnormality, atraumatic  Eyes: conj clear, EOMi PEERLA  Ears: normal TM's and external ear canals both ears  Nose: Nares normal. Septum midline. Mucosa normal. No drainage or sinus tenderness.  Throat: lips, mucosa, and tongue normal; teeth and gums normal  Neck: no adenopathy, no carotid bruit, no JVD, supple, symmetrical, trachea midline and thyroid not enlarged, symmetric, no tenderness/mass/nodules  No CVA tenderness.  Lungs: clear to auscultation bilaterally  Breasts: normal appearance Heart: regular rate and rhythm, S1, S2 normal, no murmur, click, rub or gallop  Abdomen: soft, non-tender; bowel sounds normal; no masses, no organomegaly  Musculoskeletal: ROM normal in all joints, no crepitus, no deformity, Normal muscle strengthen. Back  is symmetric, no curvature. Skin: Skin color, texture, turgor normal. No rashes or lesions  Lymph nodes: Cervical, supraclavicular, and axillary nodes normal.  Neurologic: CN 2 -12 Normal, Normal symmetric reflexes. Normal coordination and gait  Psych: Alert & Oriented x 3, Mood appear  stable.    Assessment:    Annual wellness medicare exam   Plan:    During the course of the visit the patient was educated and counseled about appropriate screening and preventive services including:   Colorectal cancer screening--- had 2005    Diet review for nutrition referral? Yes ____ Not Indicated __x__  Patient Instructions (the written plan) was given to the patient.  Medicare Attestation  I have personally reviewed:  The patient's medical and social history  Their use of alcohol, tobacco or illicit drugs  Their current medications and supplements  The patient's functional ability including ADLs,fall risks, home safety risks,  cognitive, and hearing and visual impairment  Diet and physical activities  Evidence for depression or mood disorders  The patient's weight, height, BMI, and visual acuity have been recorded in the chart. I have made referrals, counseling, and provided education to the patient based on review of the above and I have provided the patient with a written personalized care plan for preventive services.

## 2011-10-20 ENCOUNTER — Other Ambulatory Visit: Payer: Self-pay | Admitting: Sports Medicine

## 2011-11-04 DIAGNOSIS — Z86711 Personal history of pulmonary embolism: Secondary | ICD-10-CM | POA: Insufficient documentation

## 2011-11-04 DIAGNOSIS — N529 Male erectile dysfunction, unspecified: Secondary | ICD-10-CM | POA: Insufficient documentation

## 2011-11-04 DIAGNOSIS — Z8249 Family history of ischemic heart disease and other diseases of the circulatory system: Secondary | ICD-10-CM | POA: Insufficient documentation

## 2011-11-04 NOTE — Patient Instructions (Addendum)
Continue same medications and return in 6 months 

## 2011-11-06 ENCOUNTER — Telehealth: Payer: Self-pay | Admitting: Internal Medicine

## 2011-11-06 NOTE — Telephone Encounter (Signed)
Patient has family history of aortic aneurysm. We called to see if he wanted to set up an ultrasound for screening. He says he had this done at a mobile unit 2 years ago and reportedly result was negative.

## 2011-11-16 ENCOUNTER — Other Ambulatory Visit: Payer: Self-pay | Admitting: Internal Medicine

## 2012-02-14 ENCOUNTER — Other Ambulatory Visit: Payer: Self-pay | Admitting: Sports Medicine

## 2012-03-14 ENCOUNTER — Other Ambulatory Visit: Payer: Self-pay | Admitting: Internal Medicine

## 2012-03-15 ENCOUNTER — Other Ambulatory Visit: Payer: Self-pay | Admitting: Sports Medicine

## 2012-03-24 ENCOUNTER — Encounter: Payer: Self-pay | Admitting: Internal Medicine

## 2012-03-24 ENCOUNTER — Ambulatory Visit (INDEPENDENT_AMBULATORY_CARE_PROVIDER_SITE_OTHER): Payer: Medicare Other | Admitting: Internal Medicine

## 2012-03-24 VITALS — BP 158/80 | HR 88 | Temp 98.2°F | Wt 198.0 lb

## 2012-03-24 DIAGNOSIS — R1011 Right upper quadrant pain: Secondary | ICD-10-CM

## 2012-03-24 LAB — CBC WITH DIFFERENTIAL/PLATELET
Basophils Absolute: 0.1 10*3/uL (ref 0.0–0.1)
Eosinophils Absolute: 0.2 10*3/uL (ref 0.0–0.7)
Eosinophils Relative: 2 % (ref 0–5)
Lymphs Abs: 1.9 10*3/uL (ref 0.7–4.0)
MCH: 28.6 pg (ref 26.0–34.0)
MCV: 84.7 fL (ref 78.0–100.0)
Neutrophils Relative %: 61 % (ref 43–77)
Platelets: 306 10*3/uL (ref 150–400)
RBC: 4.76 MIL/uL (ref 4.22–5.81)
RDW: 13.6 % (ref 11.5–15.5)
WBC: 7.3 10*3/uL (ref 4.0–10.5)

## 2012-03-24 NOTE — Patient Instructions (Addendum)
We will arrange for ultrasound of the gallbladder. Lab work drawn today.

## 2012-03-24 NOTE — Progress Notes (Signed)
  Subjective:    Patient ID: Luis Cisneros, male    DOB: 01-05-1941, 72 y.o.   MRN: [id number]  HPI 72 year old white male says he's experienced 2 episodes over the past 2 or 3 weeks of right upper quadrant pain that he describes as a stitch in his upper abdomen. These all occurred while he was eating. He thought it might be positional and attempted to change positions without relief. Pain did not last for hours. Not associated with nausea vomiting or diarrhea. Could not get comfortable when this occurred. No burping. No epigastric pain. No dyspepsia or water brash. He does do a lot of physical activity. Was not eating fatty foods when the pain occurred. Denies constipation.    Review of Systems     Objective:   Physical Exam He has tenderness along his right lower region but also is tender along his left lower rib. Abdomen is nondistended with no hepatosplenomegaly masses or tenderness. Bowel sounds are active. Skin is warm and dry. Cardiac exam regular rate and rhythm normal S1 and S2. Chest is clear to auscultation. Extremities without edema.       Assessment & Plan:  Right upper quadrant abdominal pain-etiology unclear. Considerations include musculoskeletal pain or perhaps cholecystitis  Plan: He will have ultrasound of the gallbladder. Drew CBC with DF C. met amylase and lipase  Spent 25 minutes with patient going over symptoms and events.

## 2012-03-25 ENCOUNTER — Telehealth: Payer: Self-pay

## 2012-03-25 LAB — HEPATIC FUNCTION PANEL
Albumin: 4.2 g/dL (ref 3.5–5.2)
Alkaline Phosphatase: 63 U/L (ref 39–117)
Total Bilirubin: 0.5 mg/dL (ref 0.3–1.2)
Total Protein: 6.7 g/dL (ref 6.0–8.3)

## 2012-03-25 LAB — AMYLASE: Amylase: 60 U/L (ref 0–105)

## 2012-03-25 LAB — LIPASE: Lipase: 15 U/L (ref 0–75)

## 2012-03-25 NOTE — Telephone Encounter (Signed)
Patient scheduled for abdominal US on 03/27/2012 at 9:15 am at [facility], [address] location. He has been informed of this appointment.

## 2012-03-27 ENCOUNTER — Ambulatory Visit
Admission: RE | Admit: 2012-03-27 | Discharge: 2012-03-27 | Disposition: A | Discharge status: Home / Self Care | Payer: Medicare Other | Source: Ambulatory Visit | Attending: Internal Medicine | Admitting: Internal Medicine

## 2012-04-01 ENCOUNTER — Telehealth: Payer: Self-pay

## 2012-04-01 NOTE — Progress Notes (Signed)
Patient informed. 

## 2012-04-01 NOTE — Telephone Encounter (Signed)
Patient is declining doing a hepatobiliary scan at this time, as he says he is asymptomatic.

## 2012-04-01 NOTE — Telephone Encounter (Signed)
OK-- noted that he has declined further evaluation of RUQ pain after negative abdominal ultrasound.

## 2012-04-08 ENCOUNTER — Ambulatory Visit (INDEPENDENT_AMBULATORY_CARE_PROVIDER_SITE_OTHER): Payer: Medicare Other | Admitting: Sports Medicine

## 2012-04-08 VITALS — BP 138/78 | Ht 70.0 in | Wt 198.0 lb

## 2012-04-08 DIAGNOSIS — M67919 Unspecified disorder of synovium and tendon, unspecified shoulder: Secondary | ICD-10-CM

## 2012-04-08 MED ORDER — TRAMADOL HCL 50 MG PO TABS: 50.0000 mg | ORAL_TABLET | Freq: Four times a day (QID) | ORAL | Status: DC | PRN

## 2012-04-08 MED ORDER — MELOXICAM 15 MG PO TABS: 15.0000 mg | ORAL_TABLET | Freq: Every day | ORAL | Status: DC

## 2012-04-08 NOTE — Assessment & Plan Note (Signed)
Significant DJD bilat More sxs on RT Less on left although more DJD on exam  Cont meloxicam and tramadol ROM exercise

## 2012-04-08 NOTE — Progress Notes (Signed)
Patient ID: Luis Cisneros, male   DOB: 22-Jun-1940, 72 y.o.   MRN: [id number]  Known history of arthritis in hip, knees and low back. Known history of rotator cuff tears bilateraly. Patient reports new L lateral elbow aching and tenderness with no radiation for about 1 month but does not recall injury to elbow. Patient reports worsening arthritis since last visit with difficulty rising from the floor and tieing shoes, shoulder morning stiffness lasting few minutes but relieved by stretching activities   Exam Older man in NAD  R Knee lacks 10 deg full extension gets 130 flex Lt knee lacks 5 deg of full extension and gets 130 flex  RT hip 10 deg internal rotation and 35 deg ext rot  L Hip 5 deg internal rot, 30 deg ext rot Both hips get about 120 deg of flexion Slt pain on RT - ER on left  Shoulders showed full elevation and ER Some limitation of IR only on RT Neg empty can, hawkins tests   Asess 1. See prob list  2. Minor left lateral epicondylitis  Plan   Easy extension and rotation exercises on left   Use compression for warmth

## 2012-04-08 NOTE — Assessment & Plan Note (Signed)
This is much improved Able to do all work activity

## 2012-04-08 NOTE — Patient Instructions (Addendum)
For the elbow at night: Wrist extensions - 3 x 15 Wrist rolls - 3 x 15  Use warmth for compression  For your hips - don't force flexion but do easy motion for both to keep from getting tighter  Check out web site for [employer] if you get more knee pain you might try full knee sleeve

## 2012-04-27 ENCOUNTER — Other Ambulatory Visit: Payer: Self-pay | Admitting: Sports Medicine

## 2012-09-15 ENCOUNTER — Other Ambulatory Visit: Payer: Self-pay | Admitting: Internal Medicine

## 2012-09-15 ENCOUNTER — Other Ambulatory Visit: Payer: Self-pay

## 2012-09-15 NOTE — Telephone Encounter (Signed)
Pt due for CPe after Sept 13. Please check to see if has appt and refill statin until then

## 2012-09-15 NOTE — Telephone Encounter (Signed)
Scheduled for a CPE in October.

## 2012-10-10 ENCOUNTER — Other Ambulatory Visit: Payer: Self-pay | Admitting: Sports Medicine

## 2012-10-24 ENCOUNTER — Other Ambulatory Visit: Payer: Medicare Other | Admitting: Internal Medicine

## 2012-10-24 DIAGNOSIS — Z79899 Other long term (current) drug therapy: Secondary | ICD-10-CM

## 2012-10-24 DIAGNOSIS — Z130 Encounter for screening for diseases of the blood and blood-forming organs and certain disorders involving the immune mechanism: Secondary | ICD-10-CM

## 2012-10-24 DIAGNOSIS — E785 Hyperlipidemia, unspecified: Secondary | ICD-10-CM

## 2012-10-24 DIAGNOSIS — Z125 Encounter for screening for malignant neoplasm of prostate: Secondary | ICD-10-CM

## 2012-10-24 LAB — COMPREHENSIVE METABOLIC PANEL
ALT: 22 U/L (ref 0–53)
AST: 22 U/L (ref 0–37)
Calcium: 9.1 mg/dL (ref 8.4–10.5)
Chloride: 100 mEq/L (ref 96–112)
Creat: 0.81 mg/dL (ref 0.50–1.35)
Potassium: 4.3 mEq/L (ref 3.5–5.3)

## 2012-10-24 LAB — PSA: PSA: 0.89 ng/mL (ref <=4.00)

## 2012-10-24 LAB — CBC WITH DIFFERENTIAL/PLATELET
Basophils Absolute: 0 10*3/uL (ref 0.0–0.1)
Eosinophils Relative: 4 % (ref 0–5)
Lymphocytes Relative: 34 % (ref 12–46)
Neutro Abs: 2.8 10*3/uL (ref 1.7–7.7)
Neutrophils Relative %: 50 % (ref 43–77)
Platelets: 284 10*3/uL (ref 150–400)
RDW: 14.2 % (ref 11.5–15.5)
WBC: 5.5 10*3/uL (ref 4.0–10.5)

## 2012-10-24 LAB — LIPID PANEL: Total CHOL/HDL Ratio: 2.8 Ratio

## 2012-10-28 ENCOUNTER — Ambulatory Visit (INDEPENDENT_AMBULATORY_CARE_PROVIDER_SITE_OTHER): Payer: Medicare Other | Admitting: Internal Medicine

## 2012-10-28 ENCOUNTER — Encounter: Payer: Self-pay | Admitting: Internal Medicine

## 2012-10-28 VITALS — BP 150/84 | HR 80 | Temp 98.6°F | Ht 69.0 in | Wt 195.0 lb

## 2012-10-28 DIAGNOSIS — F338 Other recurrent depressive disorders: Secondary | ICD-10-CM

## 2012-10-28 DIAGNOSIS — M7918 Myalgia, other site: Secondary | ICD-10-CM

## 2012-10-28 DIAGNOSIS — F39 Unspecified mood [affective] disorder: Secondary | ICD-10-CM

## 2012-10-28 DIAGNOSIS — Z0000 Encounter for general adult medical examination without abnormal findings: Secondary | ICD-10-CM

## 2012-10-28 DIAGNOSIS — E785 Hyperlipidemia, unspecified: Secondary | ICD-10-CM

## 2012-10-28 DIAGNOSIS — IMO0001 Reserved for inherently not codable concepts without codable children: Secondary | ICD-10-CM

## 2012-10-28 DIAGNOSIS — Z86711 Personal history of pulmonary embolism: Secondary | ICD-10-CM

## 2012-10-28 DIAGNOSIS — N529 Male erectile dysfunction, unspecified: Secondary | ICD-10-CM

## 2012-10-28 DIAGNOSIS — Z136 Encounter for screening for cardiovascular disorders: Secondary | ICD-10-CM

## 2012-10-28 LAB — POCT URINALYSIS DIPSTICK
Bilirubin, UA: NEGATIVE
Leukocytes, UA: NEGATIVE
Nitrite, UA: NEGATIVE
Protein, UA: NEGATIVE
Urobilinogen, UA: NEGATIVE
pH, UA: 5.5

## 2012-10-28 NOTE — Progress Notes (Signed)
Subjective:    Patient ID: Luis Cisneros, male    DOB: March 26, 1940, 72 y.o.   MRN: [id number]  HPI 72 year old  White  male for health maintenance and evaluation for of medical problems. History of hyperlipidemia and seasonal affective disorder. History of erectile dysfunction. History of musculoskeletal pain for which he takes Mobic and has seen Dr. Roanna Epley in the past. Occasionally takes tramadol for musculoskeletal pain. Takes Wellbutrin for seasonal affective disorder.  Past medical history: History of bilateral pulmonary emboli status post surgery in 2006 for leg fracture. Takes baby aspirin daily as a result of history of pulmonary emboli. History of calcified granuloma right middle lobe. Quit smoking over 20 years ago. He had a CT of the chest in October 2011 showing noncalcified nodule previously identified within the right middle lobe was completely stable and considered benign. Also thought to have scarring in the posterior left lower lobe.  History of degenerative joint disease. History of cervical radiculopathy in 2001.  Old records indicate he had impaired glucose tolerance and vitamin D deficiency. Hemoglobin A1c in 2011 was 5.7% which was considered to be normal. At one point he became anemic because of donating blood every 6 weeks in 2010. He now donates blood 3 times yearly.  Tetanus immunization done in March 2008. Has had Zostavax vaccine. Declines influenza immunization.  Had bone density study in 2009 which was normal.  Social history: Does not smoke. Social alcohol consumption. Stays physically at a remodeling homes. Married to [place] (Mimi) Terrilyn Saver.  Family history: Father died at age 37 due to lung infection an MI. Mother died at age 10 with apparent aortic aneurysm rupture. 2 sisters in good health.  Patient had screening several years ago for abdominal aortic aneurysm. Says it's been about 5 years ago and was done through an advertisement in the newspaper that  included carotid Dopplers et Karie Soda.     Review of Systems  Constitutional: Positive for fatigue.  HENT: Positive for sneezing.   Eyes: Negative.   Respiratory: Negative.   Cardiovascular: Negative.   Gastrointestinal: Negative.   Endocrine: Negative.   Genitourinary: Negative.   Allergic/Immunologic: Positive for environmental allergies.  Neurological: Negative.   Hematological: Negative.   Psychiatric/Behavioral: Negative.        Objective:   Physical Exam  Vitals reviewed. Constitutional: He is oriented to person, place, and time. He appears well-developed and well-nourished. No distress.  HENT:  Head: Normocephalic and atraumatic.  Right Ear: External ear normal.  Left Ear: External ear normal.  Mouth/Throat: Oropharynx is clear and moist.  Eyes: Conjunctivae and EOM are normal. Pupils are equal, round, and reactive to light. Right eye exhibits no discharge. Left eye exhibits no discharge. No scleral icterus.  Neck: Neck supple. No JVD present. No thyromegaly present.  Cardiovascular: Normal rate, regular rhythm, normal heart sounds and intact distal pulses.   No murmur heard. Pulmonary/Chest: Effort normal and breath sounds normal. No respiratory distress. He has no wheezes. He has no rales.  Abdominal: Soft. Bowel sounds are normal.  Genitourinary: Prostate normal.  Musculoskeletal: Normal range of motion. He exhibits no edema.  Lymphadenopathy:    He has no cervical adenopathy.  Neurological: He is alert and oriented to person, place, and time. He has normal reflexes. No cranial nerve deficit. Coordination normal.  Skin: Skin is warm and dry. No rash noted. He is not diaphoretic.  Psychiatric: He has a normal mood and affect. His behavior is normal. Judgment and thought content normal.  Assessment & Plan:  History of seasonal affect disorder stable on Wellbutrin  Hyperlipidemia  Erectile dysfunction  History of bilateral pulmonary emboli status  post surgery  Musculoskeletal pain  Plan: Patient return in one year or as needed. Continue with Zocor for hyperlipidemia, Wellbutrin for seasonal affect if disorder, Mobic as needed for musculoskeletal pain.    Subjective:   Patient presents for Medicare Annual/Subsequent preventive examination.   Review Past Medical/Family/Social: see above   Risk Factors  Current exercise habits: work with Production designer, theatre/television/film Dietary issues discussed: low fat low carb  Cardiac risk factors: Family history in maternal grandparents Grandmother died of MI, Mother with AAA, Maternal grandfather died of stroke  Depression Screen  (Note: if answer to either of the following is "Yes", a more complete depression screening is indicated)   Over the past two weeks, have you felt down, depressed or hopeless? No  Over the past two weeks, have you felt little interest or pleasure in doing things? No Have you lost interest or pleasure in daily life? No Do you often feel hopeless? No Do you cry easily over simple problems? No   Activities of Daily Living  In your present state of health, do you have any difficulty performing the following activities?:   Driving? No  Managing money? No  Feeding yourself? No  Getting from bed to chair? No  Climbing a flight of stairs? No  Preparing food and eating?: No  Bathing or showering? No  Getting dressed: No  Getting to the toilet? No  Using the toilet:No  Moving around from place to place: No  In the past year have you fallen or had a near fall?:No  Are you sexually active? yes  Do you have more than one partner? No   Hearing Difficulties: yes both ears high frequency hearing loss- has hearing aid right ear Do you often ask people to speak up or repeat themselves? No  Do you experience ringing or noises in your ears? No  Do you have difficulty understanding soft or whispered voices? No  Do you feel that you have a problem with memory? No Do you often  misplace items? No    Home Safety:  Do you have a smoke alarm at your residence? Yes Do you have grab bars in the bathroom? no Do you have throw rugs in your house? yes   Cognitive Testing  Alert? Yes Normal Appearance?Yes  Oriented to person? Yes Place? Yes  Time? Yes  Recall of three objects? Yes  Can perform simple calculations? Yes  Displays appropriate judgment?Yes  Can read the correct time from a watch face?Yes   List the Names of Other Physician/Practitioners you currently use:  See referral list for the physicians patient is currently seeing.  [facility]    Review of Systems: See above   Objective:     General appearance: Appears stated age  Head: Normocephalic, without obvious abnormality, atraumatic  Eyes: conj clear, EOMi PEERLA  Ears: normal TM's and external ear canals both ears  Nose: Nares normal. Septum midline. Mucosa normal. No drainage or sinus tenderness.  Throat: lips, mucosa, and tongue normal; teeth and gums normal  Neck: no adenopathy, no carotid bruit, no JVD, supple, symmetrical, trachea midline and thyroid not enlarged, symmetric, no tenderness/mass/nodules  No CVA tenderness.  Lungs: clear to auscultation bilaterally  Breasts: normal appearance, no masses or tenderness. Heart: regular rate and rhythm, S1, S2 normal, no murmur, click, rub or gallop  Abdomen: soft, non-tender;  bowel sounds normal; no masses, no organomegaly  Musculoskeletal: ROM normal in all joints, no crepitus, no deformity, Normal muscle strengthen. Back  is symmetric, no curvature. Skin: Skin color, texture, turgor normal. No rashes or lesions  Lymph nodes: Cervical, supraclavicular, and axillary nodes normal.  Neurologic: CN 2 -12 Normal, Normal symmetric reflexes. Normal coordination and gait  Psych: Alert & Oriented x 3, Mood appear stable.    Assessment:    Annual wellness medicare exam   Plan:    During the course of the visit the patient was  educated and counseled about appropriate screening and preventive services including:   Screening for abdominal aortic aneurysm with family history in his mother.  Annual PSA  Colonoscopy due 2015  Patient declines flu vaccine     Patient Instructions (the written plan) was given to the patient.  Medicare Attestation  I have personally reviewed:  The patient's medical and social history  Their use of alcohol, tobacco or illicit drugs  Their current medications and supplements  The patient's functional ability including ADLs,fall risks, home safety risks, cognitive, and hearing and visual impairment  Diet and physical activities  Evidence for depression or mood disorders  The patient's weight, height, BMI, and visual acuity have been recorded in the chart. I have made referrals, counseling, and provided education to the patient based on review of the above and I have provided the patient with a written personalized care plan for preventive services.

## 2012-10-28 NOTE — Patient Instructions (Addendum)
Continue same medications and return in one year. 

## 2012-10-31 ENCOUNTER — Ambulatory Visit: Admission: RE | Admit: 2012-10-31 | Payer: Medicare Other | Source: Ambulatory Visit

## 2012-11-26 ENCOUNTER — Other Ambulatory Visit: Payer: Self-pay | Admitting: Internal Medicine

## 2013-03-16 ENCOUNTER — Other Ambulatory Visit: Payer: Self-pay | Admitting: Internal Medicine

## 2013-04-13 ENCOUNTER — Other Ambulatory Visit: Payer: Self-pay | Admitting: Sports Medicine

## 2013-04-29 ENCOUNTER — Other Ambulatory Visit: Payer: Self-pay | Admitting: Sports Medicine

## 2013-05-06 ENCOUNTER — Ambulatory Visit (INDEPENDENT_AMBULATORY_CARE_PROVIDER_SITE_OTHER): Payer: Medicare Other | Admitting: Sports Medicine

## 2013-05-06 ENCOUNTER — Encounter: Payer: Self-pay | Admitting: Sports Medicine

## 2013-05-06 VITALS — BP 165/92 | HR 83 | Ht 69.0 in | Wt 195.0 lb

## 2013-05-06 DIAGNOSIS — M75120 Complete rotator cuff tear or rupture of unspecified shoulder, not specified as traumatic: Secondary | ICD-10-CM

## 2013-05-06 DIAGNOSIS — M25569 Pain in unspecified knee: Secondary | ICD-10-CM

## 2013-05-06 MED ORDER — TRAMADOL HCL 50 MG PO TABS: 50.0000 mg | ORAL_TABLET | Freq: Four times a day (QID) | ORAL | Status: DC | PRN

## 2013-05-06 MED ORDER — MELOXICAM 15 MG PO TABS: 15.0000 mg | ORAL_TABLET | Freq: Every day | ORAL | Status: DC | PRN

## 2013-05-06 NOTE — Progress Notes (Signed)
Patient ID: Luis Cisneros, male   DOB: February 05, 1941, 73 y.o.   MRN: [id number]  Patient c/o Lt shoulder and Lt knee pain Doing kitchen remodeling Left shoulder painful Hurts at night Feels weaker than RT No specific injury Does exercises and full rotation hurts left shoulder  Also had chronic RT knee pain Had to do some work on knees Now the RT knee won't bend as fully Hurts going down steps  Exam NAD BP 165/92  Pulse 83  Ht 5\' 9"  (1.753 m)  Wt 195 lb (88.451 kg)  BMI 28.78 kg/m2  RT Shoulder: Inspection reveals no abnormalities, atrophy or asymmetry. Palpation is normal with no tenderness over AC joint or bicipital groove. ROM is full in all planes. Rotator cuff strength normal throughout. No signs of impingement with negative Neer and Hawkin's tests, empty can sign. Speeds and Yergason's tests normal. No labral pathology noted with negative Obrien's, negative clunk and good stability. Normal scapular function observed. No painful arc and no drop arm sign. No apprehension sign  LT shoulder On left the exam differs only in that he gets pain in empty can and hawkins Mild decrease in strength  RT knee with chronic medial cmpt spurring Limited full flexion prob SP pouch effusion  MSK [place] Lt shoulder shows mild AC joint DJD w small effusion Small partial tear of SupSP Also has central area of tendon with old retracted fibers Other RC tendons intact Biceps has some thickening proximally  Knee shows chronic DJD changes but large SP pouch effusion

## 2013-05-06 NOTE — Assessment & Plan Note (Signed)
He has significant DJD changes  Today w effusion  Use compression sleeve to lessen swelling  SLR exerciss and avoid deep knee bend

## 2013-05-06 NOTE — Assessment & Plan Note (Signed)
Now shows partial tear of left RC over supraspinatus tendon  Work light strength exercise  Renew tramadol and mobic  Trial of NTG pending response  Reck prn

## 2013-05-29 ENCOUNTER — Other Ambulatory Visit: Payer: Self-pay | Admitting: Sports Medicine

## 2013-09-16 ENCOUNTER — Other Ambulatory Visit: Payer: Self-pay | Admitting: Internal Medicine

## 2013-09-16 NOTE — Telephone Encounter (Signed)
Must schedule CPE mid Sept before we refill

## 2013-10-23 ENCOUNTER — Other Ambulatory Visit: Payer: Medicare Other | Admitting: Internal Medicine

## 2013-10-23 DIAGNOSIS — Z125 Encounter for screening for malignant neoplasm of prostate: Secondary | ICD-10-CM

## 2013-10-23 DIAGNOSIS — Z0000 Encounter for general adult medical examination without abnormal findings: Secondary | ICD-10-CM

## 2013-10-23 DIAGNOSIS — E785 Hyperlipidemia, unspecified: Secondary | ICD-10-CM

## 2013-10-23 DIAGNOSIS — Z131 Encounter for screening for diabetes mellitus: Secondary | ICD-10-CM

## 2013-10-23 LAB — COMPREHENSIVE METABOLIC PANEL
ALBUMIN: 4 g/dL (ref 3.5–5.2)
ALT: 22 U/L (ref 0–53)
AST: 23 U/L (ref 0–37)
Alkaline Phosphatase: 70 U/L (ref 39–117)
BILIRUBIN TOTAL: 0.7 mg/dL (ref 0.2–1.2)
BUN: 21 mg/dL (ref 6–23)
CALCIUM: 9.2 mg/dL (ref 8.4–10.5)
CHLORIDE: 102 meq/L (ref 96–112)
CO2: 27 meq/L (ref 19–32)
Creat: 0.76 mg/dL (ref 0.50–1.35)
GLUCOSE: 95 mg/dL (ref 70–99)
Potassium: 4.1 mEq/L (ref 3.5–5.3)
SODIUM: 140 meq/L (ref 135–145)
TOTAL PROTEIN: 6.6 g/dL (ref 6.0–8.3)

## 2013-10-23 LAB — CBC WITH DIFFERENTIAL/PLATELET
Basophils Absolute: 0 10*3/uL (ref 0.0–0.1)
Basophils Relative: 1 % (ref 0–1)
EOS ABS: 0.1 10*3/uL (ref 0.0–0.7)
Eosinophils Relative: 3 % (ref 0–5)
HCT: 41.7 % (ref 39.0–52.0)
Hemoglobin: 14.4 g/dL (ref 13.0–17.0)
LYMPHS PCT: 32 % (ref 12–46)
Lymphs Abs: 1.4 10*3/uL (ref 0.7–4.0)
MCH: 29.8 pg (ref 26.0–34.0)
MCHC: 34.5 g/dL (ref 30.0–36.0)
MCV: 86.2 fL (ref 78.0–100.0)
MONOS PCT: 12 % (ref 3–12)
Monocytes Absolute: 0.5 10*3/uL (ref 0.1–1.0)
Neutro Abs: 2.2 10*3/uL (ref 1.7–7.7)
Neutrophils Relative %: 52 % (ref 43–77)
Platelets: 260 10*3/uL (ref 150–400)
RBC: 4.84 MIL/uL (ref 4.22–5.81)
RDW: 13.9 % (ref 11.5–15.5)
WBC: 4.3 10*3/uL (ref 4.0–10.5)

## 2013-10-23 LAB — LIPID PANEL
CHOLESTEROL: 169 mg/dL (ref 0–200)
HDL: 72 mg/dL (ref >39)
LDL Cholesterol: 84 mg/dL (ref 0–99)
Total CHOL/HDL Ratio: 2.3 Ratio
Triglycerides: 64 mg/dL (ref <150)
VLDL: 13 mg/dL (ref 0–40)

## 2013-10-24 LAB — PSA: PSA: 1.07 ng/mL (ref <=4.00)

## 2013-10-26 ENCOUNTER — Ambulatory Visit (INDEPENDENT_AMBULATORY_CARE_PROVIDER_SITE_OTHER): Payer: Medicare Other | Admitting: Internal Medicine

## 2013-10-26 ENCOUNTER — Encounter: Payer: Self-pay | Admitting: Internal Medicine

## 2013-10-26 VITALS — BP 160/80 | HR 80 | Ht 69.0 in | Wt 190.0 lb

## 2013-10-26 DIAGNOSIS — I10 Essential (primary) hypertension: Secondary | ICD-10-CM

## 2013-10-26 DIAGNOSIS — F338 Other recurrent depressive disorders: Secondary | ICD-10-CM

## 2013-10-26 DIAGNOSIS — N529 Male erectile dysfunction, unspecified: Secondary | ICD-10-CM

## 2013-10-26 DIAGNOSIS — E785 Hyperlipidemia, unspecified: Secondary | ICD-10-CM

## 2013-10-26 DIAGNOSIS — F39 Unspecified mood [affective] disorder: Secondary | ICD-10-CM

## 2013-10-26 DIAGNOSIS — Z0000 Encounter for general adult medical examination without abnormal findings: Secondary | ICD-10-CM

## 2013-10-26 LAB — POCT URINALYSIS DIPSTICK
Bilirubin, UA: NEGATIVE
Blood, UA: NEGATIVE
GLUCOSE UA: NEGATIVE
KETONES UA: NEGATIVE
LEUKOCYTES UA: NEGATIVE
Nitrite, UA: NEGATIVE
Spec Grav, UA: >=1.03
Urobilinogen, UA: NEGATIVE
pH, UA: 6

## 2013-10-26 MED ORDER — AMLODIPINE BESYLATE 5 MG PO TABS: 5.0000 mg | ORAL_TABLET | Freq: Every day | ORAL | Status: DC

## 2013-10-26 NOTE — Patient Instructions (Signed)
Begin Norvasc 5 mg daily and return in 4 weeks. Your blood work is excellent.

## 2013-10-28 ENCOUNTER — Other Ambulatory Visit: Payer: Self-pay | Admitting: *Deleted

## 2013-10-28 MED ORDER — TRAMADOL HCL 50 MG PO TABS: 50.0000 mg | ORAL_TABLET | Freq: Four times a day (QID) | ORAL | Status: DC | PRN

## 2013-11-01 ENCOUNTER — Encounter: Payer: Self-pay | Admitting: Internal Medicine

## 2013-11-01 NOTE — Progress Notes (Signed)
Subjective:    Patient ID: Luis Cisneros, male    DOB: 11-15-1940, 73 y.o.   MRN: [id number]  HPI  73 year old White Male in today for health maintenance and evaluation of medical issues. His blood pressure is elevated today. He has a history of hyperlipidemia, seasonal effective disorder, erectile dysfunction. Sees Dr. Andreas Blower for knee pain. In old skeletal pain. Takes Wellbutrin for seasonal affective disorder.  Past medical history: History of degenerative joint disease. History of cervical radiculopathy 2001. History of bilateral pulmonary emboli status post surgery in 2006 for leg fracture. Takes baby aspirin daily as a result of pulmonary emboli. History of calcified granuloma right middle lobe. Quit smoking over 20 years ago. Had a CT of the chest in October 2011 showing noncalcified nodule previously identified within the right middle lobe which was completely stable and considered benign. Also felt to have scarring in the posterior left lower lobe.  Old records indicate he had impaired glucose tolerance and vitamin D deficiency. However, Hemoglobin A1c in 2011 was 5.7% which is considered to be normal. At one point he became anemic because of donating blood every 6 weeks in 2010. He now donates blood 3 times yearly.  Social history: He is married to [employer]. Stays physically active at remodeling homes. Social alcohol consumption. Quit smoking over 20 years ago.  Family history: Father died at age 68 do to a lung infection and MI. Mother died at age 19 with apparent aortic aneurysm rupture. 2 sisters in good health.  Patient has been screening for abdominal aortic aneurysm.    Review of Systems  Constitutional: Negative for activity change and appetite change.  HENT: Negative.   Respiratory: Negative.   Cardiovascular: Negative.   Gastrointestinal: Negative.   Endocrine:       Hyperlipidemia  Genitourinary:       Erectile dysfunction  Neurological: Negative.     Psychiatric/Behavioral: Positive for dysphoric mood.       Objective:   Physical Exam  Vitals reviewed. Constitutional: He is oriented to person, place, and time. He appears well-developed and well-nourished. No distress.  HENT:  Head: Normocephalic and atraumatic.  Right Ear: External ear normal.  Left Ear: External ear normal.  Mouth/Throat: Oropharynx is clear and moist.  Eyes: Conjunctivae and EOM are normal. Pupils are equal, round, and reactive to light. Right eye exhibits no discharge. Left eye exhibits no discharge. No scleral icterus.  Neck: Neck supple. No JVD present.  Cardiovascular: Normal rate, normal heart sounds and intact distal pulses.   No murmur heard. Occasional extrasystole  Pulmonary/Chest: Effort normal and breath sounds normal. He has no wheezes.  Abdominal: Soft. Bowel sounds are normal. He exhibits no distension and no mass. There is no tenderness. There is no rebound and no guarding.  Genitourinary: Prostate normal.  Slightly boggy prostate but no nodules  Musculoskeletal: Normal range of motion. He exhibits no edema.  Lymphadenopathy:    He has no cervical adenopathy.  Neurological: He is alert and oriented to person, place, and time. He has normal reflexes. No cranial nerve deficit. Coordination normal.  Skin: Skin is warm and dry. No rash noted. He is not diaphoretic.  Psychiatric: He has a normal mood and affect. His behavior is normal. Judgment and thought content normal.          Assessment & Plan:  Musculoskeletal pain  History of knee pain seen by Dr. Oneida Alar  Hyperlipidemia treated with Zocor  Seasonal effective disorder  Remote history of  pulmonary emboli  Erectile dysfunction  New diagnosis essential hypertension  Plan: Start Norvasc 5 mg daily and return in 4 weeks   Subjective:   Patient presents for Medicare Annual/Subsequent preventive examination.  Review Past Medical/Family/Social: See above   Risk Factors   Current exercise habits: Physically active working Dietary issues discussed: Low-fat low-carb  Cardiac risk factors: Hypertension, hyperlipidemia, family history father of MI  Depression Screen  (Note: if answer to either of the following is "Yes", a more complete depression screening is indicated)   Over the past two weeks, have you felt down, depressed or hopeless? sometimes Over the past two weeks, have you felt little interest or pleasure in doing things? No Have you lost interest or pleasure in daily life? No Do you often feel hopeless? No Do you cry easily over simple problems? No   Activities of Daily Living  In your present state of health, do you have any difficulty performing the following activities?:   Driving? No  Managing money? No  Feeding yourself? No  Getting from bed to chair? No  Climbing a flight of stairs?sometimes Preparing food and eating?: No  Bathing or showering? No  Getting dressed: No  Getting to the toilet? No  Using the toilet:No  Moving around from place to place: No  In the past year have you fallen or had a near fall?:No  Are you sexually active? Some- would like to be more Do you have more than one partner? No   Hearing Difficulties: No  Do you often ask people to speak up or repeat themselves? yes Do you experience ringing or noises in your ears? No  Do you have difficulty understanding soft or whispered voices? yes Do you feel that you have a problem with memory? No Do you often misplace items? sometimes   Home Safety:  Do you have a smoke alarm at your residence? Yes Do you have grab bars in the bathroom? no Do you have throw rugs in your house? yes   Cognitive Testing  Alert? Yes Normal Appearance?Yes  Oriented to person? Yes Place? Yes  Time? Yes  Recall of three objects? Yes  Can perform simple calculations? Yes  Displays appropriate judgment?Yes  Can read the correct time from a watch face?Yes   List the Names of Other  Physician/Practitioners you currently use:  See referral list for the physicians patient is currently seeing.  Dr. Andreas Blower, sports med   Review of Systems: see above   Objective:     General appearance: Appears stated age  Head: Normocephalic, without obvious abnormality, atraumatic  Eyes: conj clear, EOMi PEERLA  Ears: normal TM's and external ear canals both ears  Nose: Nares normal. Septum midline. Mucosa normal. No drainage or sinus tenderness.  Throat: lips, mucosa, and tongue normal; teeth and gums normal  Neck: no adenopathy, no carotid bruit, no JVD, supple, symmetrical, trachea midline and thyroid not enlarged, symmetric, no tenderness/mass/nodules  No CVA tenderness.  Lungs: clear to auscultation bilaterally  Breasts: normal appearance, no masses or tenderness. Occasional extrasystole Heart: regular rate and rhythm, S1, S2 normal, no murmur, click, rub or gallop  Abdomen: soft, non-tender; bowel sounds normal; no masses, no organomegaly  Musculoskeletal: ROM normal in all joints, no crepitus, no deformity, Normal muscle strengthen. Back  is symmetric, no curvature. Skin: Skin color, texture, turgor normal. No rashes or lesions  Lymph nodes: Cervical, supraclavicular, and axillary nodes normal.  Neurologic: CN 2 -12 Normal, Normal symmetric reflexes. Normal coordination and  gait  Psych: Alert & Oriented x 3, Mood appear stable.    Assessment:    Annual wellness medicare exam   Plan:    During the course of the visit the patient was educated and counseled about appropriate screening and preventive services including:   Annual PSA  Routine colonoscopy  Annual flu vaccine     Patient Instructions (the written plan) was given to the patient.  Medicare Attestation  I have personally reviewed:  The patient's medical and social history  Their use of alcohol, tobacco or illicit drugs  Their current medications and supplements  The patient's functional ability  including ADLs,fall risks, home safety risks, cognitive, and hearing and visual impairment  Diet and physical activities  Evidence for depression or mood disorders  The patient's weight, height, BMI, and visual acuity have been recorded in the chart. I have made referrals, counseling, and provided education to the patient based on review of the above and I have provided the patient with a written personalized care plan for preventive services.

## 2013-11-23 ENCOUNTER — Ambulatory Visit: Payer: Medicare Other | Admitting: Internal Medicine

## 2013-11-27 ENCOUNTER — Encounter: Payer: Self-pay | Admitting: Internal Medicine

## 2013-11-30 ENCOUNTER — Encounter: Payer: Self-pay | Admitting: Internal Medicine

## 2013-11-30 ENCOUNTER — Ambulatory Visit (INDEPENDENT_AMBULATORY_CARE_PROVIDER_SITE_OTHER): Payer: Medicare Other | Admitting: Internal Medicine

## 2013-11-30 VITALS — BP 128/80 | HR 91 | Temp 98.9°F | Wt 195.0 lb

## 2013-11-30 DIAGNOSIS — I10 Essential (primary) hypertension: Secondary | ICD-10-CM

## 2013-11-30 NOTE — Patient Instructions (Signed)
Return in 6 months. Continue amlodipine.

## 2013-11-30 NOTE — Progress Notes (Signed)
   Subjective:    Patient ID: Luis Cisneros, male    DOB: 01-02-1941, 73 y.o.   MRN: [id number]  HPI   At last visit was started on amlodipine 5 mg daily for blood pressure. He brings in multiple blood pressure readings today which are very acceptable. Generally blood pressure is running between 120 and 130 for the most part systolically. He feels well without complaints.    Review of Systems     Objective:   Physical Exam Neck is supple without JVD thyromegaly or carotid bruits. Chest clear to auscultation. Cardiac exam regular rate and rhythm normal S1 and S2. Extremities without edema       Assessment & Plan:  Essential hypertension-improved on amlodipine 5 mg daily  Plan: We will start seeing patient every 6 months instead of yearly. He'll return in April for office visit, lipid panel liver functions and blood pressure check.

## 2013-12-01 ENCOUNTER — Ambulatory Visit: Payer: Medicare Other | Admitting: Internal Medicine

## 2013-12-02 ENCOUNTER — Other Ambulatory Visit: Payer: Self-pay | Admitting: Internal Medicine

## 2013-12-22 ENCOUNTER — Other Ambulatory Visit: Payer: Self-pay | Admitting: Internal Medicine

## 2014-02-08 ENCOUNTER — Encounter: Payer: Self-pay | Admitting: Sports Medicine

## 2014-02-08 ENCOUNTER — Ambulatory Visit (INDEPENDENT_AMBULATORY_CARE_PROVIDER_SITE_OTHER): Payer: Medicare Other | Admitting: Sports Medicine

## 2014-02-08 ENCOUNTER — Ambulatory Visit: Payer: Medicare Other | Admitting: Sports Medicine

## 2014-02-08 VITALS — BP 126/77 | Ht 69.0 in | Wt 195.0 lb

## 2014-02-08 DIAGNOSIS — M25552 Pain in left hip: Secondary | ICD-10-CM

## 2014-02-08 MED ORDER — METHYLPREDNISOLONE ACETATE 80 MG/ML IJ SUSP
80.0000 mg | Freq: Once | INTRAMUSCULAR | Status: AC
  Administered 2014-02-08: 80 mg via INTRAMUSCULAR

## 2014-02-08 NOTE — Progress Notes (Signed)
   Subjective:    Patient ID: Luis Cisneros, male    DOB: 1940/11/27, 74 y.o.   MRN: [id number]  HPI chief complaint: Posterior left hip pain  74 year old gentleman comes in today complaining of 6 months of posterior left hip pain. No trauma. His pain is worse when first getting up and ambulating. He denies any lateral hip pain. He denies any groin pain. He does get an occasional "twinge" of pain down the lateral leg and lateral knee and at times into the lateral ankle. This discomfort is fleeting and not associated with any numbness or tingling. No low back pain. He is beginning to get some weakness in his legs and hips as a result of his pain and this has him concerned. He takes meloxicam and tramadol daily but it has not affected his hip pain. He has had hip pain in the past and x-rays in 2007 that showed mild degenerative changes. No prior hip surgeries. Past medical history reviewed Medications reviewed Allergies reviewed    Review of Systems     Objective:   Physical Exam Well-developed, well-nourished. No acute distress. Awake alert and oriented 3. Vital signs reviewed.  Left hip: Patient is tender to palpation along the distal piriformis. No real tenderness to palpation along the posterior aspect of the greater trochanter nor over the area of the greater trochanteric bursa. He has limited hip internal rotation passively to about 20 and this does reproduce some posterior hip pain but no groin pain. This passive range of motion is identical to his right hip. He has mild weakness with resisted hip flexion. Neurological exam shows a negative straight leg raise bilaterally. Strength is 5/5 both lower extremities and reflexes are equal at the Achilles and patellar tendons. No asymmetric atrophy although he does have some mild quad atrophy bilaterally. Evaluation of his gait shows him to walk with a slightly externally rotated left foot       Assessment & Plan:  Posterior left hip pain  likely secondary to piriformis strain Hip osteoarthritis  I'm going to try an IM injection of Depo-Medrol. 80 mg IM injected today. We will start him on a home hip strengthening program consisting of hip abductor strengthening and external rotator strengthening. Continue on tramadol and meloxicam and follow-up with me in 3 weeks. If symptoms persist I may start with plain x-rays of both his lumbar spine and his left hip. Call with questions or concerns in the interim.

## 2014-03-01 ENCOUNTER — Ambulatory Visit: Payer: Medicare Other | Admitting: Sports Medicine

## 2014-03-01 ENCOUNTER — Other Ambulatory Visit: Payer: Self-pay | Admitting: *Deleted

## 2014-03-01 DIAGNOSIS — L853 Xerosis cutis: Secondary | ICD-10-CM | POA: Diagnosis not present

## 2014-03-01 DIAGNOSIS — D2261 Melanocytic nevi of right upper limb, including shoulder: Secondary | ICD-10-CM | POA: Diagnosis not present

## 2014-03-01 DIAGNOSIS — L570 Actinic keratosis: Secondary | ICD-10-CM | POA: Diagnosis not present

## 2014-03-01 DIAGNOSIS — Z85828 Personal history of other malignant neoplasm of skin: Secondary | ICD-10-CM | POA: Diagnosis not present

## 2014-03-01 DIAGNOSIS — L821 Other seborrheic keratosis: Secondary | ICD-10-CM | POA: Diagnosis not present

## 2014-03-01 MED ORDER — BUPROPION HCL 75 MG PO TABS: 75.0000 mg | ORAL_TABLET | Freq: Two times a day (BID) | ORAL | Status: DC

## 2014-03-01 NOTE — Telephone Encounter (Signed)
Refills on Bupropion sent to patient pharmacy

## 2014-03-03 ENCOUNTER — Ambulatory Visit (INDEPENDENT_AMBULATORY_CARE_PROVIDER_SITE_OTHER): Payer: Medicare Other | Admitting: Sports Medicine

## 2014-03-03 ENCOUNTER — Encounter: Payer: Self-pay | Admitting: Sports Medicine

## 2014-03-03 VITALS — BP 136/78

## 2014-03-03 DIAGNOSIS — M25552 Pain in left hip: Secondary | ICD-10-CM | POA: Diagnosis not present

## 2014-03-03 NOTE — Progress Notes (Signed)
   Subjective:    Patient ID: ESCO JOSLYN, male    DOB: [birth year], 74 y.o.   MRN: [id number]  HPI  Patient comes in today for follow-up on left hip pain. He has noticed some improvement after recent IM cortisone injection. Since his last office visit he has also noticed intermittent groin pain. Still experiencing occasional weakness in the left knee as well as numbness along the lateral left foot but he knows that this is secondary to his sciatica. He takes meloxicam on a daily basis and feels like that does help. Tramadol has also been helpful.    Review of Systems     Objective:   Physical Exam Well-developed, well-nourished. No acute distress.  Left hip: Patient continues to demonstrate limited internal rotation (20). Mild weakness with resisted hip flexion as well. No tenderness over the greater trochanteric bursa. Neurovascularly intact distally. Continues to walk with a slightly externally rotated left foot.       Assessment & Plan:  Left hip pain likely secondary to osteoarthritis  X-rays of the left hip. He has a history of low back pain and sciatica as well so I'll also get x-rays of his lumbar spine. I will call him at [phone] after I reviewed those studies. He is doing fairly well after his recent IM injection of Depo-Medrol and I've recommended that we not pursue any further treatment at this time. We could consider an intra-articular cortisone injection into his left hip at a later date if his symptoms warrant. Continue on meloxicam (patient is seen going to need a 90 day supply soon and asked me to refill it). He may continue with tramadol as well.

## 2014-03-04 ENCOUNTER — Ambulatory Visit
Admission: RE | Admit: 2014-03-04 | Discharge: 2014-03-04 | Disposition: A | Discharge status: Home / Self Care | Payer: Medicare Other | Source: Ambulatory Visit | Attending: Sports Medicine | Admitting: Sports Medicine

## 2014-03-04 DIAGNOSIS — M160 Bilateral primary osteoarthritis of hip: Secondary | ICD-10-CM | POA: Diagnosis not present

## 2014-03-04 DIAGNOSIS — M5137 Other intervertebral disc degeneration, lumbosacral region: Secondary | ICD-10-CM | POA: Diagnosis not present

## 2014-03-04 DIAGNOSIS — M25552 Pain in left hip: Secondary | ICD-10-CM

## 2014-03-05 ENCOUNTER — Telehealth: Payer: Self-pay | Admitting: Sports Medicine

## 2014-03-05 NOTE — Telephone Encounter (Signed)
I spoke with the patient on the phone today after reviewing the x-rays of his lumbar spine and his hips. He has end-stage degenerative changes of the left hip. There is almost complete loss of joint space, sclerosis, spurring, and subchondral cyst formation. He also has some moderately advanced degenerative changes in the right hip but his right hip is currently asymptomatic. His lumbar spine does show some mild to moderate degenerative changes primarily in the facets but the degenerative changes in his lumbar spine are not as significant as what we see in his hips. At this point in time his symptoms are tolerable. I explained to him that future treatment options could include an intra-articular cortisone injection or referral for a total hip arthroplasty. Definitive treatment for this is a total hip arthroplasty. He is doing "okay" after an IM cortisone injection a few weeks ago. He will see how things progress over the next couple of weeks and will call me if his symptoms are worsening.

## 2014-03-16 ENCOUNTER — Other Ambulatory Visit: Payer: Self-pay | Admitting: *Deleted

## 2014-03-16 ENCOUNTER — Telehealth: Payer: Self-pay | Admitting: *Deleted

## 2014-03-16 NOTE — Telephone Encounter (Signed)
Faxed a refill on tramadol to his pharmacy

## 2014-03-20 ENCOUNTER — Other Ambulatory Visit: Payer: Self-pay | Admitting: Internal Medicine

## 2014-03-23 ENCOUNTER — Encounter: Payer: Self-pay | Admitting: *Deleted

## 2014-03-23 NOTE — Patient Instructions (Signed)
[address] [facility] Camel Thursday 03/25/14 at 10:15am [address], [place], [place] [address] [phone]

## 2014-03-25 DIAGNOSIS — M1611 Unilateral primary osteoarthritis, right hip: Secondary | ICD-10-CM | POA: Diagnosis not present

## 2014-04-02 ENCOUNTER — Other Ambulatory Visit: Payer: Self-pay | Admitting: Orthopedic Surgery

## 2014-04-16 ENCOUNTER — Other Ambulatory Visit: Payer: Self-pay | Admitting: *Deleted

## 2014-04-16 ENCOUNTER — Encounter (HOSPITAL_COMMUNITY)
Admission: RE | Admit: 2014-04-16 | Discharge: 2014-04-16 | Disposition: A | Discharge status: Home / Self Care | Payer: Medicare Other | Source: Ambulatory Visit | Attending: Orthopedic Surgery | Admitting: Orthopedic Surgery

## 2014-04-16 ENCOUNTER — Encounter (HOSPITAL_COMMUNITY): Payer: Self-pay

## 2014-04-16 DIAGNOSIS — Z01818 Encounter for other preprocedural examination: Secondary | ICD-10-CM

## 2014-04-16 DIAGNOSIS — M1612 Unilateral primary osteoarthritis, left hip: Secondary | ICD-10-CM | POA: Diagnosis not present

## 2014-04-16 DIAGNOSIS — M47894 Other spondylosis, thoracic region: Secondary | ICD-10-CM | POA: Diagnosis not present

## 2014-04-16 DIAGNOSIS — Z01811 Encounter for preprocedural respiratory examination: Secondary | ICD-10-CM | POA: Diagnosis not present

## 2014-04-16 DIAGNOSIS — Z01812 Encounter for preprocedural laboratory examination: Secondary | ICD-10-CM | POA: Diagnosis not present

## 2014-04-16 DIAGNOSIS — Z0183 Encounter for blood typing: Secondary | ICD-10-CM | POA: Diagnosis not present

## 2014-04-16 HISTORY — DX: Major depressive disorder, single episode, unspecified: F32.9

## 2014-04-16 HISTORY — DX: Depression, unspecified: F32.A

## 2014-04-16 HISTORY — DX: Unspecified osteoarthritis, unspecified site: M19.90

## 2014-04-16 HISTORY — DX: Other fracture of unspecified lower leg, initial encounter for closed fracture: S82.899A

## 2014-04-16 LAB — CBC WITH DIFFERENTIAL/PLATELET
BASOS ABS: 0 10*3/uL (ref 0.0–0.1)
Basophils Relative: 1 % (ref 0–1)
Eosinophils Absolute: 0.2 10*3/uL (ref 0.0–0.7)
Eosinophils Relative: 3 % (ref 0–5)
HCT: 40.9 % (ref 39.0–52.0)
Hemoglobin: 13.4 g/dL (ref 13.0–17.0)
LYMPHS ABS: 1.4 10*3/uL (ref 0.7–4.0)
LYMPHS PCT: 23 % (ref 12–46)
MCH: 29.1 pg (ref 26.0–34.0)
MCHC: 32.8 g/dL (ref 30.0–36.0)
MCV: 88.7 fL (ref 78.0–100.0)
Monocytes Absolute: 0.7 10*3/uL (ref 0.1–1.0)
Monocytes Relative: 11 % (ref 3–12)
NEUTROS PCT: 62 % (ref 43–77)
Neutro Abs: 3.7 10*3/uL (ref 1.7–7.7)
PLATELETS: 310 10*3/uL (ref 150–400)
RBC: 4.61 MIL/uL (ref 4.22–5.81)
RDW: 13.3 % (ref 11.5–15.5)
WBC: 6 10*3/uL (ref 4.0–10.5)

## 2014-04-16 LAB — APTT: aPTT: 28 seconds (ref 24–37)

## 2014-04-16 LAB — URINALYSIS, ROUTINE W REFLEX MICROSCOPIC
BILIRUBIN URINE: NEGATIVE
Glucose, UA: NEGATIVE mg/dL
HGB URINE DIPSTICK: NEGATIVE
KETONES UR: NEGATIVE mg/dL
Leukocytes, UA: NEGATIVE
Nitrite: NEGATIVE
PH: 5 (ref 5.0–8.0)
PROTEIN: NEGATIVE mg/dL
Specific Gravity, Urine: 1.027 (ref 1.005–1.030)
Urobilinogen, UA: 0.2 mg/dL (ref 0.0–1.0)

## 2014-04-16 LAB — BASIC METABOLIC PANEL
Anion gap: 7 (ref 5–15)
BUN: 25 mg/dL — ABNORMAL HIGH (ref 6–23)
CO2: 27 mmol/L (ref 19–32)
Calcium: 9.4 mg/dL (ref 8.4–10.5)
Chloride: 105 mmol/L (ref 96–112)
Creatinine, Ser: 0.83 mg/dL (ref 0.50–1.35)
GFR calc Af Amer: >90 mL/min (ref >90)
GFR, EST NON AFRICAN AMERICAN: 85 mL/min — AB (ref >90)
Glucose, Bld: 131 mg/dL — ABNORMAL HIGH (ref 70–99)
POTASSIUM: 3.8 mmol/L (ref 3.5–5.1)
SODIUM: 139 mmol/L (ref 135–145)

## 2014-04-16 LAB — ABO/RH: ABO/RH(D): A NEG

## 2014-04-16 LAB — PROTIME-INR
INR: 1.04 (ref 0.00–1.49)
PROTHROMBIN TIME: 13.7 s (ref 11.6–15.2)

## 2014-04-16 LAB — SURGICAL PCR SCREEN
MRSA, PCR: NEGATIVE
Staphylococcus aureus: POSITIVE — AB

## 2014-04-16 LAB — TYPE AND SCREEN
ABO/RH(D): A NEG
ANTIBODY SCREEN: NEGATIVE

## 2014-04-16 MED ORDER — TRAMADOL HCL 50 MG PO TABS: 50.0000 mg | ORAL_TABLET | Freq: Four times a day (QID) | ORAL | Status: DC | PRN

## 2014-04-16 NOTE — Pre-Procedure Instructions (Signed)
Luis Cisneros  04/16/2014   Your procedure is scheduled on:  Wednesday, March 23  Report to [facility] Main Entrance "A" at 10:45 AM.  Call this number if you have problems the morning of surgery: [phone]               Any questions prior to surgery call pre-admission [phone] (Monday-Friday 8:00am-4:00pm)   Remember:   Do not eat food or drink liquids after midnight.   Take these medicines the morning of surgery with A SIP OF WATER: amlodipine, wellbutrin, tramadol              Stop NSAIDS: advil, ibuprofen, aleve,or anything containing aspirin 7 days prior to surgery (March 16)             Stop fish oil, vitamins 7 days prior to surgery   Do not wear jewelry, make-up or nail polish.  Do not wear lotions, powders, or perfumes. You may wear deodorant.  Do not shave 48 hours prior to surgery. Men may shave face and neck.  Do not bring valuables to the hospital.  [facility] is not responsible                  for any belongings or valuables.               Contacts, dentures or bridgework may not be worn into surgery.  Leave suitcase in the car. After surgery it may be brought to your room.  For patients admitted to the hospital, discharge time is determined by your                treatment team.               Patients discharged the day of surgery will not be allowed to drive  home.  Name and phone number of your driver:   Special Instructions: review preparing for surgery handout   Please read over the following fact sheets that you were given: Pain Booklet, Coughing and Deep Breathing, Blood Transfusion Information, Total Joint Packet, MRSA Information and Surgical Site Infection Prevention

## 2014-04-16 NOTE — Progress Notes (Signed)
Pt. Has not been instructed on when to stop aspirin from his surgeon. Pt. Doesn't want to stop mobic prior to surgery. States if he stops it he will not be able to move. Notified Ebony Hail, [place] ; stated to inform Dr. Damita Dunnings office and they can instruct pt. Left message with Juliann Pulse blume to follow up with pt. Instructed pt. If he does not here from her to call them Monday afternoon.

## 2014-04-16 NOTE — Pre-Procedure Instructions (Signed)
Luis Cisneros  04/16/2014   Your procedure is scheduled on:  Wednesday, March 23  Report to [facility] Main Entrance "A" at 10:45 AM.  Call this number if you have problems the morning of surgery: [phone]               Any questions prior to surgery call pre-admission [phone] (Monday-Friday 8:00am-4:00pm)   Remember:   Do not eat food or drink liquids after midnight.   Take these medicines the morning of surgery with A SIP OF WATER: amlodipine, wellbutrin, tramadol,              Stop NSAIDS: advil, ibuprofen, aleve,or anything containing aspirin 7 days prior to surgery (March 16)   Do not wear jewelry, make-up or nail polish.  Do not wear lotions, powders, or perfumes. You may wear deodorant.  Do not shave 48 hours prior to surgery. Men may shave face and neck.  Do not bring valuables to the hospital.  [facility] is not responsible                  for any belongings or valuables.               Contacts, dentures or bridgework may not be worn into surgery.  Leave suitcase in the car. After surgery it may be brought to your room.  For patients admitted to the hospital, discharge time is determined by your                treatment team.               Patients discharged the day of surgery will not be allowed to drive  home.  Name and phone number of your driver:   Special Instructions: review preparing for surgery handout   Please read over the following fact sheets that you were given: Pain Booklet, Coughing and Deep Breathing, Blood Transfusion Information, Total Joint Packet, MRSA Information and Surgical Site Infection Prevention

## 2014-04-16 NOTE — Progress Notes (Signed)
I called a prescription for Mupirocin ointment to [facility], [place], [place].

## 2014-04-27 MED ORDER — VANCOMYCIN HCL IN DEXTROSE 1-5 GM/200ML-% IV SOLN
1000.0000 mg | INTRAVENOUS | Status: AC
  Administered 2014-04-28: 1000 mg via INTRAVENOUS
  Filled 2014-04-27: qty 200

## 2014-04-27 MED ORDER — DEXTROSE-NACL 5-0.45 % IV SOLN: INTRAVENOUS | Status: DC

## 2014-04-27 MED ORDER — CHLORHEXIDINE GLUCONATE 4 % EX LIQD
60.0000 mL | Freq: Once | CUTANEOUS | Status: DC
  Filled 2014-04-27: qty 60

## 2014-04-27 NOTE — H&P (Signed)
TOTAL HIP ADMISSION H&P  Patient is admitted for left total hip arthroplasty.  Subjective:  Chief Complaint: left hip pain  HPI: Luis Cisneros, 74 y.o. male, has a history of pain and functional disability in the left hip(s) due to arthritis and patient has failed non-surgical conservative treatments for greater than 12 weeks to include NSAID's and/or analgesics, flexibility and strengthening excercises, use of assistive devices, weight reduction as appropriate and activity modification.  Onset of symptoms was gradual starting several years ago with gradually worsening course since that time.The patient noted no past surgery on the left hip(s).  Patient currently rates pain in the left hip at 10 out of 10 with activity. Patient has night pain, worsening of pain with activity and weight bearing, pain that interfers with activities of daily living, pain with passive range of motion and crepitus. Patient has evidence of subchondral sclerosis, periarticular osteophytes and joint space narrowing by imaging studies. This condition presents safety issues increasing the risk of falls.  There is no current active infection.  Patient Active Problem List   Diagnosis Date Noted  . Erectile dysfunction 11/04/2011  . History of pulmonary embolism 11/04/2011  . Family history of aortic aneurysm 11/04/2011  . Hyperlipidemia 11/01/2010  . Seasonal affective disorder 11/01/2010  . ROTATOR CUFF TEAR 01/11/2009  . SHOULDER PAIN, RIGHT 12/14/2008  . ROTATOR CUFF SYNDROME, RIGHT 12/14/2008  . KNEE PAIN, RIGHT 04/30/2007  . OSTEOARTHROSIS, LOCAL NOS, PELVIS/THIGH 07/10/2006   Past Medical History  Diagnosis Date  . Pulmonary embolism   . Hyperlipidemia   . Hypertension   . Vitamin D deficiency   . Depression   . Arthritis   . Ankle fracture 2006    right    Past Surgical History  Procedure Laterality Date  . Tonsillectomy      No prescriptions prior to admission   Allergies  Allergen Reactions  .  Cephalexin   . Feldene [Piroxicam] Nausea Only and Other (See Comments)    Makes pt feel loopy    History  Substance Use Topics  . Smoking status: Former Smoker -- 1.00 packs/day    Types: Cigarettes    Quit date: 02/06/1987  . Smokeless tobacco: Not on file  . Alcohol Use: Yes     Comment: 1 drink  4-5 days a week    Family History  Problem Relation Age of Onset  . Aortic aneurysm Mother   . Heart disease Father      Review of Systems  Constitutional: Positive for malaise/fatigue.  HENT: Negative.   Eyes: Negative.   Respiratory: Negative.   Cardiovascular: Negative.        HTN  Gastrointestinal: Negative.   Genitourinary: Negative.   Musculoskeletal: Positive for joint pain.  Skin: Negative.   Neurological: Negative.   Endo/Heme/Allergies: Negative.   Psychiatric/Behavioral: Positive for depression.    Objective:  Physical Exam  Constitutional: He is oriented to person, place, and time. He appears well-developed and well-nourished.  HENT:  Head: Normocephalic and atraumatic.  Eyes: Pupils are equal, round, and reactive to light.  Neck: Normal range of motion. Neck supple.  Cardiovascular: Intact distal pulses.   Respiratory: Effort normal.  Musculoskeletal:  the patient has good strength bilaterally in his hips.  Patient's right hip does have decreased internal rotation.  No pain with hip flexion extension internal/external rotation or log roll.  Patient's left hip has obvious stiffness with internal and external rotation.  Minimal pain with flexion and extension.  Again moderate pain with  internal rotation he has internal rotation to 0-5.  Minimal pain with heeltap.  Patient has mild groin pain.  He has brisk capillary refill and is neurovascularly intact distally.  Neurological: He is alert and oriented to person, place, and time.  Skin: Skin is warm and dry.  Psychiatric: He has a normal mood and affect. His behavior is normal. Judgment and thought content  normal.    Vital signs in last 24 hours:    Labs:   Estimated body mass index is 28.78 kg/(m^2) as calculated from the following:   Height as of 02/08/14: 5\' 9"  (1.753 m).   Weight as of 02/08/14: 88.451 kg (195 lb).   Imaging Review Plain radiographs demonstrate Patient's left hip with severe end-stage arthritis with periarticular osteophytes and lateral subluxation.  Assessment/Plan:  End stage arthritis, left hip(s)  The patient history, physical examination, clinical judgement of the provider and imaging studies are consistent with end stage degenerative joint disease of the left hip(s) and total hip arthroplasty is deemed medically necessary. The treatment options including medical management, injection therapy, arthroscopy and arthroplasty were discussed at length. The risks and benefits of total hip arthroplasty were presented and reviewed. The risks due to aseptic loosening, infection, stiffness, dislocation/subluxation,  thromboembolic complications and other imponderables were discussed.  The patient acknowledged the explanation, agreed to proceed with the plan and consent was signed. Patient is being admitted for inpatient treatment for surgery, pain control, PT, OT, prophylactic antibiotics, VTE prophylaxis, progressive ambulation and ADL's and discharge planning.The patient is planning to be discharged home with home health services

## 2014-04-28 ENCOUNTER — Encounter (HOSPITAL_COMMUNITY): Payer: Self-pay | Admitting: *Deleted

## 2014-04-28 ENCOUNTER — Inpatient Hospital Stay (HOSPITAL_COMMUNITY): Payer: Medicare Other

## 2014-04-28 ENCOUNTER — Inpatient Hospital Stay (HOSPITAL_COMMUNITY): Payer: Medicare Other | Admitting: Vascular Surgery

## 2014-04-28 ENCOUNTER — Encounter (HOSPITAL_COMMUNITY)
Admission: RE | Disposition: A | Discharge status: Home Health | Payer: Self-pay | Source: Ambulatory Visit | Attending: Orthopedic Surgery

## 2014-04-28 ENCOUNTER — Inpatient Hospital Stay (HOSPITAL_COMMUNITY)
Admission: RE | Admit: 2014-04-28 | Discharge: 2014-04-30 | DRG: 982 | Disposition: A | Discharge status: Home Health | Payer: Medicare Other | Source: Ambulatory Visit | Attending: Orthopedic Surgery | Admitting: Orthopedic Surgery

## 2014-04-28 ENCOUNTER — Inpatient Hospital Stay (HOSPITAL_COMMUNITY): Payer: Medicare Other | Admitting: Anesthesiology

## 2014-04-28 DIAGNOSIS — Z7982 Long term (current) use of aspirin: Secondary | ICD-10-CM

## 2014-04-28 DIAGNOSIS — Z79899 Other long term (current) drug therapy: Secondary | ICD-10-CM

## 2014-04-28 DIAGNOSIS — Z87891 Personal history of nicotine dependence: Secondary | ICD-10-CM

## 2014-04-28 DIAGNOSIS — F329 Major depressive disorder, single episode, unspecified: Secondary | ICD-10-CM | POA: Diagnosis present

## 2014-04-28 DIAGNOSIS — I10 Essential (primary) hypertension: Principal | ICD-10-CM | POA: Diagnosis present

## 2014-04-28 DIAGNOSIS — Z96642 Presence of left artificial hip joint: Secondary | ICD-10-CM

## 2014-04-28 DIAGNOSIS — Z8249 Family history of ischemic heart disease and other diseases of the circulatory system: Secondary | ICD-10-CM | POA: Diagnosis not present

## 2014-04-28 DIAGNOSIS — E785 Hyperlipidemia, unspecified: Secondary | ICD-10-CM | POA: Diagnosis not present

## 2014-04-28 DIAGNOSIS — Z7901 Long term (current) use of anticoagulants: Secondary | ICD-10-CM

## 2014-04-28 DIAGNOSIS — D62 Acute posthemorrhagic anemia: Secondary | ICD-10-CM | POA: Diagnosis not present

## 2014-04-28 DIAGNOSIS — M1610 Unilateral primary osteoarthritis, unspecified hip: Secondary | ICD-10-CM | POA: Diagnosis present

## 2014-04-28 DIAGNOSIS — M1612 Unilateral primary osteoarthritis, left hip: Secondary | ICD-10-CM | POA: Diagnosis not present

## 2014-04-28 DIAGNOSIS — E559 Vitamin D deficiency, unspecified: Secondary | ICD-10-CM | POA: Diagnosis not present

## 2014-04-28 DIAGNOSIS — Z96649 Presence of unspecified artificial hip joint: Secondary | ICD-10-CM

## 2014-04-28 DIAGNOSIS — Z86711 Personal history of pulmonary embolism: Secondary | ICD-10-CM

## 2014-04-28 DIAGNOSIS — M25552 Pain in left hip: Secondary | ICD-10-CM | POA: Diagnosis present

## 2014-04-28 DIAGNOSIS — M169 Osteoarthritis of hip, unspecified: Secondary | ICD-10-CM | POA: Diagnosis not present

## 2014-04-28 DIAGNOSIS — Z471 Aftercare following joint replacement surgery: Secondary | ICD-10-CM | POA: Diagnosis not present

## 2014-04-28 HISTORY — PX: TOTAL HIP ARTHROPLASTY: SHX124

## 2014-04-28 LAB — CREATININE, SERUM
Creatinine, Ser: 0.71 mg/dL (ref 0.50–1.35)
GFR calc Af Amer: >90 mL/min (ref >90)

## 2014-04-28 SURGERY — ARTHROPLASTY, HIP, TOTAL,POSTERIOR APPROACH
Anesthesia: General | Site: Hip | Laterality: Left

## 2014-04-28 MED ORDER — MIDAZOLAM HCL 2 MG/2ML IJ SOLN
INTRAMUSCULAR | Status: AC
  Filled 2014-04-28: qty 2

## 2014-04-28 MED ORDER — METOCLOPRAMIDE HCL 5 MG PO TABS: 5.0000 mg | ORAL_TABLET | Freq: Three times a day (TID) | ORAL | Status: DC | PRN

## 2014-04-28 MED ORDER — FLEET ENEMA 7-19 GM/118ML RE ENEM: 1.0000 | ENEMA | Freq: Once | RECTAL | Status: AC | PRN

## 2014-04-28 MED ORDER — ONDANSETRON HCL 4 MG PO TABS
4.0000 mg | ORAL_TABLET | Freq: Four times a day (QID) | ORAL | Status: DC | PRN
  Administered 2014-04-29: 4 mg via ORAL
  Filled 2014-04-28: qty 1

## 2014-04-28 MED ORDER — LACTATED RINGERS IV SOLN
INTRAVENOUS | Status: DC | PRN
  Administered 2014-04-28 (×2): via INTRAVENOUS

## 2014-04-28 MED ORDER — ROCURONIUM BROMIDE 50 MG/5ML IV SOLN
INTRAVENOUS | Status: AC
  Filled 2014-04-28: qty 1

## 2014-04-28 MED ORDER — PHENOL 1.4 % MT LIQD
1.0000 | OROMUCOSAL | Status: DC | PRN
Start: 2014-04-28 — End: 2014-04-30

## 2014-04-28 MED ORDER — MENTHOL 3 MG MT LOZG: 1.0000 | LOZENGE | OROMUCOSAL | Status: DC | PRN

## 2014-04-28 MED ORDER — TRANEXAMIC ACID 100 MG/ML IV SOLN
2000.0000 mg | Freq: Once | INTRAVENOUS | Status: DC
  Filled 2014-04-28: qty 20

## 2014-04-28 MED ORDER — NEOSTIGMINE METHYLSULFATE 10 MG/10ML IV SOLN
INTRAVENOUS | Status: DC | PRN
  Administered 2014-04-28: 4 mg via INTRAVENOUS

## 2014-04-28 MED ORDER — ASPIRIN 81 MG PO CHEW
81.0000 mg | CHEWABLE_TABLET | Freq: Every day | ORAL | Status: DC
  Administered 2014-04-29 – 2014-04-30 (×2): 81 mg via ORAL
  Filled 2014-04-28 (×2): qty 1

## 2014-04-28 MED ORDER — FENTANYL CITRATE 0.05 MG/ML IJ SOLN
INTRAMUSCULAR | Status: DC | PRN
  Administered 2014-04-28 (×3): 50 ug via INTRAVENOUS
  Administered 2014-04-28: 100 ug via INTRAVENOUS

## 2014-04-28 MED ORDER — KCL IN DEXTROSE-NACL 20-5-0.45 MEQ/L-%-% IV SOLN
INTRAVENOUS | Status: DC
  Administered 2014-04-28 – 2014-04-29 (×2): 125 mL/h via INTRAVENOUS
  Filled 2014-04-28 (×9): qty 1000

## 2014-04-28 MED ORDER — DIPHENHYDRAMINE HCL 12.5 MG/5ML PO ELIX: 12.5000 mg | ORAL_SOLUTION | ORAL | Status: DC | PRN

## 2014-04-28 MED ORDER — METHOCARBAMOL 500 MG PO TABS
500.0000 mg | ORAL_TABLET | Freq: Four times a day (QID) | ORAL | Status: DC | PRN
  Administered 2014-04-29 – 2014-04-30 (×3): 500 mg via ORAL
  Filled 2014-04-28 (×3): qty 1

## 2014-04-28 MED ORDER — LIDOCAINE HCL (CARDIAC) 20 MG/ML IV SOLN
INTRAVENOUS | Status: AC
  Filled 2014-04-28: qty 5

## 2014-04-28 MED ORDER — METHOCARBAMOL 1000 MG/10ML IJ SOLN
500.0000 mg | Freq: Four times a day (QID) | INTRAVENOUS | Status: DC | PRN
  Filled 2014-04-28: qty 5

## 2014-04-28 MED ORDER — ONDANSETRON HCL 4 MG/2ML IJ SOLN: 4.0000 mg | Freq: Four times a day (QID) | INTRAMUSCULAR | Status: DC | PRN

## 2014-04-28 MED ORDER — MIDAZOLAM HCL 5 MG/5ML IJ SOLN
INTRAMUSCULAR | Status: DC | PRN
  Administered 2014-04-28 (×2): 1 mg via INTRAVENOUS

## 2014-04-28 MED ORDER — TIZANIDINE HCL 2 MG PO CAPS: 2.0000 mg | ORAL_CAPSULE | Freq: Three times a day (TID) | ORAL | Status: DC

## 2014-04-28 MED ORDER — LACTATED RINGERS IV SOLN
INTRAVENOUS | Status: DC
  Administered 2014-04-28: 12:00:00 via INTRAVENOUS

## 2014-04-28 MED ORDER — SODIUM CHLORIDE 0.9 % IR SOLN
Status: DC | PRN
  Administered 2014-04-28: 1000 mL

## 2014-04-28 MED ORDER — ONDANSETRON HCL 4 MG/2ML IJ SOLN
INTRAMUSCULAR | Status: DC | PRN
  Administered 2014-04-28: 4 mg via INTRAVENOUS

## 2014-04-28 MED ORDER — LIDOCAINE HCL (CARDIAC) 20 MG/ML IV SOLN
INTRAVENOUS | Status: DC | PRN
  Administered 2014-04-28: 100 mg via INTRAVENOUS

## 2014-04-28 MED ORDER — METOCLOPRAMIDE HCL 5 MG/ML IJ SOLN: 5.0000 mg | Freq: Three times a day (TID) | INTRAMUSCULAR | Status: DC | PRN

## 2014-04-28 MED ORDER — KCL IN DEXTROSE-NACL 20-5-0.45 MEQ/L-%-% IV SOLN
INTRAVENOUS | Status: AC
Start: 2014-04-28 — End: 2014-04-29
  Filled 2014-04-28: qty 1000

## 2014-04-28 MED ORDER — AMLODIPINE BESYLATE 5 MG PO TABS
5.0000 mg | ORAL_TABLET | Freq: Every day | ORAL | Status: DC
Start: 2014-04-29 — End: 2014-04-30
  Administered 2014-04-29 – 2014-04-30 (×2): 5 mg via ORAL
  Filled 2014-04-28 (×2): qty 1

## 2014-04-28 MED ORDER — ONDANSETRON HCL 4 MG/2ML IJ SOLN
INTRAMUSCULAR | Status: AC
  Filled 2014-04-28: qty 2

## 2014-04-28 MED ORDER — ROCURONIUM BROMIDE 100 MG/10ML IV SOLN
INTRAVENOUS | Status: DC | PRN
  Administered 2014-04-28: 25 mg via INTRAVENOUS

## 2014-04-28 MED ORDER — SENNOSIDES-DOCUSATE SODIUM 8.6-50 MG PO TABS: 1.0000 | ORAL_TABLET | Freq: Every evening | ORAL | Status: DC | PRN

## 2014-04-28 MED ORDER — NEOSTIGMINE METHYLSULFATE 10 MG/10ML IV SOLN
INTRAVENOUS | Status: AC
  Filled 2014-04-28: qty 1

## 2014-04-28 MED ORDER — FENTANYL CITRATE 0.05 MG/ML IJ SOLN
INTRAMUSCULAR | Status: AC
  Filled 2014-04-28: qty 4

## 2014-04-28 MED ORDER — SUFENTANIL CITRATE 50 MCG/ML IV SOLN
INTRAVENOUS | Status: AC
  Filled 2014-04-28: qty 1

## 2014-04-28 MED ORDER — ASPIRIN 81 MG PO TABS: 81.0000 mg | ORAL_TABLET | Freq: Every day | ORAL | Status: DC

## 2014-04-28 MED ORDER — PHENYLEPHRINE HCL 10 MG/ML IJ SOLN
10.0000 mg | INTRAVENOUS | Status: DC | PRN
  Administered 2014-04-28: 40 ug/min via INTRAVENOUS

## 2014-04-28 MED ORDER — GLYCOPYRROLATE 0.2 MG/ML IJ SOLN
INTRAMUSCULAR | Status: DC | PRN
  Administered 2014-04-28: 0.6 mg via INTRAVENOUS

## 2014-04-28 MED ORDER — BUPROPION HCL 75 MG PO TABS
75.0000 mg | ORAL_TABLET | Freq: Two times a day (BID) | ORAL | Status: DC
  Administered 2014-04-28 – 2014-04-30 (×4): 75 mg via ORAL
  Filled 2014-04-28 (×6): qty 1

## 2014-04-28 MED ORDER — TRANEXAMIC ACID 100 MG/ML IV SOLN
2000.0000 mg | INTRAVENOUS | Status: DC | PRN
  Administered 2014-04-28: 2000 mg via TOPICAL

## 2014-04-28 MED ORDER — DEXAMETHASONE SODIUM PHOSPHATE 10 MG/ML IJ SOLN
10.0000 mg | Freq: Once | INTRAMUSCULAR | Status: AC
  Administered 2014-04-29: 10 mg via INTRAVENOUS
  Filled 2014-04-28: qty 1

## 2014-04-28 MED ORDER — SUCCINYLCHOLINE CHLORIDE 20 MG/ML IJ SOLN
INTRAMUSCULAR | Status: AC
  Filled 2014-04-28: qty 1

## 2014-04-28 MED ORDER — PROPOFOL 10 MG/ML IV BOLUS
INTRAVENOUS | Status: DC | PRN
  Administered 2014-04-28: 20 mg via INTRAVENOUS
  Administered 2014-04-28: 100 mg via INTRAVENOUS

## 2014-04-28 MED ORDER — ACETAMINOPHEN 325 MG PO TABS: 650.0000 mg | ORAL_TABLET | Freq: Four times a day (QID) | ORAL | Status: DC | PRN

## 2014-04-28 MED ORDER — BISACODYL 5 MG PO TBEC: 5.0000 mg | DELAYED_RELEASE_TABLET | Freq: Every day | ORAL | Status: DC | PRN

## 2014-04-28 MED ORDER — HYDROMORPHONE HCL 1 MG/ML IJ SOLN: 0.5000 mg | INTRAMUSCULAR | Status: DC | PRN

## 2014-04-28 MED ORDER — MEPERIDINE HCL 25 MG/ML IJ SOLN: 6.2500 mg | INTRAMUSCULAR | Status: DC | PRN

## 2014-04-28 MED ORDER — CEFUROXIME SODIUM 1.5 G IJ SOLR
INTRAMUSCULAR | Status: AC
  Filled 2014-04-28: qty 1.5

## 2014-04-28 MED ORDER — ACETAMINOPHEN 650 MG RE SUPP: 650.0000 mg | Freq: Four times a day (QID) | RECTAL | Status: DC | PRN

## 2014-04-28 MED ORDER — BUPIVACAINE-EPINEPHRINE (PF) 0.5% -1:200000 IJ SOLN
INTRAMUSCULAR | Status: AC
  Filled 2014-04-28: qty 30

## 2014-04-28 MED ORDER — GLYCOPYRROLATE 0.2 MG/ML IJ SOLN
INTRAMUSCULAR | Status: AC
  Filled 2014-04-28: qty 3

## 2014-04-28 MED ORDER — BUPIVACAINE-EPINEPHRINE 0.5% -1:200000 IJ SOLN
INTRAMUSCULAR | Status: DC | PRN
Start: 2014-04-28 — End: 2014-04-28
  Administered 2014-04-28: 10 mL

## 2014-04-28 MED ORDER — ALUMINUM HYDROXIDE GEL 320 MG/5ML PO SUSP
15.0000 mL | ORAL | Status: DC | PRN
  Filled 2014-04-28: qty 30

## 2014-04-28 MED ORDER — PROPOFOL 10 MG/ML IV BOLUS
INTRAVENOUS | Status: AC
  Filled 2014-04-28: qty 20

## 2014-04-28 MED ORDER — FENTANYL CITRATE 0.05 MG/ML IJ SOLN
INTRAMUSCULAR | Status: AC
  Filled 2014-04-28: qty 5

## 2014-04-28 MED ORDER — PROMETHAZINE HCL 25 MG/ML IJ SOLN: 6.2500 mg | INTRAMUSCULAR | Status: DC | PRN

## 2014-04-28 MED ORDER — ENOXAPARIN SODIUM 40 MG/0.4ML ~~LOC~~ SOLN
40.0000 mg | SUBCUTANEOUS | Status: DC
Start: 2014-04-28 — End: 2014-06-07

## 2014-04-28 MED ORDER — OXYCODONE-ACETAMINOPHEN 5-325 MG PO TABS
1.0000 | ORAL_TABLET | ORAL | Status: DC | PRN
Start: 2014-04-28 — End: 2014-06-07

## 2014-04-28 MED ORDER — FENTANYL CITRATE 0.05 MG/ML IJ SOLN
25.0000 ug | INTRAMUSCULAR | Status: DC | PRN
  Administered 2014-04-28 (×2): 50 ug via INTRAVENOUS

## 2014-04-28 MED ORDER — OXYCODONE HCL 5 MG PO TABS
5.0000 mg | ORAL_TABLET | ORAL | Status: DC | PRN
  Administered 2014-04-28: 5 mg via ORAL
  Administered 2014-04-29: 10 mg via ORAL
  Administered 2014-04-29 (×2): 5 mg via ORAL
  Administered 2014-04-30 (×2): 10 mg via ORAL
  Filled 2014-04-28: qty 1
  Filled 2014-04-28: qty 2
  Filled 2014-04-28: qty 1
  Filled 2014-04-28 (×3): qty 2

## 2014-04-28 MED ORDER — ENOXAPARIN SODIUM 40 MG/0.4ML ~~LOC~~ SOLN
40.0000 mg | SUBCUTANEOUS | Status: DC
  Administered 2014-04-29 – 2014-04-30 (×2): 40 mg via SUBCUTANEOUS
  Filled 2014-04-28 (×2): qty 0.4

## 2014-04-28 MED ORDER — SUCCINYLCHOLINE CHLORIDE 20 MG/ML IJ SOLN
INTRAMUSCULAR | Status: DC | PRN
  Administered 2014-04-28: 100 mg via INTRAVENOUS

## 2014-04-28 SURGICAL SUPPLY — 54 items
BLADE SAW SGTL 18X1.27X75 (BLADE) ×2 IMPLANT
BLADE SAW SGTL 18X1.27X75MM (BLADE) ×1
BRUSH FEMORAL CANAL (MISCELLANEOUS) IMPLANT
CAPT HIP TOTAL 2 ×3 IMPLANT
COVER BACK TABLE 24X17X13 BIG (DRAPES) IMPLANT
COVER SURGICAL LIGHT HANDLE (MISCELLANEOUS) ×6 IMPLANT
DRAPE IMP U-DRAPE 54X76 (DRAPES) ×3 IMPLANT
DRAPE ORTHO SPLIT 77X108 STRL (DRAPES) ×2
DRAPE PROXIMA HALF (DRAPES) ×3 IMPLANT
DRAPE SURG ORHT 6 SPLT 77X108 (DRAPES) ×1 IMPLANT
DRAPE U-SHAPE 47X51 STRL (DRAPES) ×3 IMPLANT
DRILL BIT 7/64X5 (BIT) ×3 IMPLANT
DRSG AQUACEL AG ADV 3.5X10 (GAUZE/BANDAGES/DRESSINGS) ×3 IMPLANT
DURAPREP 26ML APPLICATOR (WOUND CARE) ×3 IMPLANT
ELECT BLADE 4.0 EZ CLEAN MEGAD (MISCELLANEOUS)
ELECT REM PT RETURN 9FT ADLT (ELECTROSURGICAL) ×3
ELECTRODE BLDE 4.0 EZ CLN MEGD (MISCELLANEOUS) IMPLANT
ELECTRODE REM PT RTRN 9FT ADLT (ELECTROSURGICAL) ×1 IMPLANT
GLOVE BIO SURGEON STRL SZ7.5 (GLOVE) ×3 IMPLANT
GLOVE BIO SURGEON STRL SZ8.5 (GLOVE) ×6 IMPLANT
GLOVE BIOGEL PI IND STRL 8 (GLOVE) ×2 IMPLANT
GLOVE BIOGEL PI IND STRL 9 (GLOVE) ×1 IMPLANT
GLOVE BIOGEL PI INDICATOR 8 (GLOVE) ×4
GLOVE BIOGEL PI INDICATOR 9 (GLOVE) ×2
GOWN STRL REUS W/ TWL LRG LVL3 (GOWN DISPOSABLE) ×2 IMPLANT
GOWN STRL REUS W/ TWL XL LVL3 (GOWN DISPOSABLE) ×3 IMPLANT
GOWN STRL REUS W/TWL LRG LVL3 (GOWN DISPOSABLE) ×6
GOWN STRL REUS W/TWL XL LVL3 (GOWN DISPOSABLE) ×6
HANDPIECE INTERPULSE COAX TIP (DISPOSABLE)
HOOD PEEL AWAY FACE SHEILD DIS (HOOD) ×9 IMPLANT
KIT BASIN OR (CUSTOM PROCEDURE TRAY) ×3 IMPLANT
KIT ROOM TURNOVER OR (KITS) ×3 IMPLANT
MANIFOLD NEPTUNE II (INSTRUMENTS) ×3 IMPLANT
NEEDLE 22X1 1/2 (OR ONLY) (NEEDLE) ×3 IMPLANT
NS IRRIG 1000ML POUR BTL (IV SOLUTION) ×3 IMPLANT
PACK TOTAL JOINT (CUSTOM PROCEDURE TRAY) ×3 IMPLANT
PACK UNIVERSAL I (CUSTOM PROCEDURE TRAY) ×3 IMPLANT
PAD ARMBOARD 7.5X6 YLW CONV (MISCELLANEOUS) ×6 IMPLANT
PASSER SUT SWANSON 36MM LOOP (INSTRUMENTS) ×3 IMPLANT
PRESSURIZER FEMORAL UNIV (MISCELLANEOUS) IMPLANT
SET HNDPC FAN SPRY TIP SCT (DISPOSABLE) IMPLANT
SUT ETHIBOND 2 V 37 (SUTURE) ×3 IMPLANT
SUT VIC AB 0 CTB1 27 (SUTURE) ×3 IMPLANT
SUT VIC AB 1 CTX 36 (SUTURE) ×2
SUT VIC AB 1 CTX36XBRD ANBCTR (SUTURE) ×1 IMPLANT
SUT VIC AB 2-0 CTB1 (SUTURE) ×3 IMPLANT
SUT VIC AB 3-0 SH 27 (SUTURE) ×4
SUT VIC AB 3-0 SH 27X BRD (SUTURE) ×2 IMPLANT
SYR CONTROL 10ML LL (SYRINGE) ×3 IMPLANT
TOWEL OR 17X24 6PK STRL BLUE (TOWEL DISPOSABLE) ×3 IMPLANT
TOWEL OR 17X26 10 PK STRL BLUE (TOWEL DISPOSABLE) ×3 IMPLANT
TOWER CARTRIDGE SMART MIX (DISPOSABLE) IMPLANT
TRAY FOLEY CATH 14FR (SET/KITS/TRAYS/PACK) IMPLANT
WATER STERILE IRR 1000ML POUR (IV SOLUTION) ×12 IMPLANT

## 2014-04-28 NOTE — Discharge Instructions (Signed)

## 2014-04-28 NOTE — Anesthesia Postprocedure Evaluation (Signed)
  Anesthesia Post-op Note  Patient: Luis Cisneros  Procedure(s) Performed: Procedure(s): TOTAL HIP ARTHROPLASTY (Left)  Patient Location: PACU  Anesthesia Type:General  Level of Consciousness: awake and alert   Airway and Oxygen Therapy: Patient Spontanous Breathing and Patient connected to nasal cannula oxygen  Post-op Pain: mild  Post-op Assessment: Post-op Vital signs reviewed and Patient's Cardiovascular Status Stable  Post-op Vital Signs: Reviewed and stable  Last Vitals:  Filed Vitals:   04/28/14 1057  BP: 142/75  Pulse: 80  Temp: 36.8 C  Resp: 18    Complications: No apparent anesthesia complications

## 2014-04-28 NOTE — Anesthesia Procedure Notes (Signed)
Procedure Name: Intubation Date/Time: 04/28/2014 1:38 PM Performed by: Williemae Area B Pre-anesthesia Checklist: Patient identified, Emergency Drugs available, Suction available and Patient being monitored Patient Re-evaluated:Patient Re-evaluated prior to inductionOxygen Delivery Method: Circle system utilized Preoxygenation: Pre-oxygenation with 100% oxygen Intubation Type: IV induction Ventilation: Mask ventilation with difficulty Laryngoscope Size: Mac and 4 Grade View: Grade I Tube type: Oral Tube size: 7.5 mm Number of attempts: 1 Airway Equipment and Method: Stylet Placement Confirmation: ETT inserted through vocal cords under direct vision,  breath sounds checked- equal and bilateral and positive ETCO2 Secured at: 22 (cm at teeth) cm Tube secured with: Tape Dental Injury: Teeth and Oropharynx as per pre-operative assessment  Comments: Inability to mask due to very full beard

## 2014-04-28 NOTE — Transfer of Care (Signed)
Immediate Anesthesia Transfer of Care Note  Patient: Luis Cisneros  Procedure(s) Performed: Procedure(s): TOTAL HIP ARTHROPLASTY (Left)  Patient Location: PACU  Anesthesia Type:General  Level of Consciousness: awake and patient cooperative  Airway & Oxygen Therapy: Patient Spontanous Breathing and Patient connected to face mask oxygen  Post-op Assessment: Report given to RN, Post -op Vital signs reviewed and stable and Patient moving all extremities  Post vital signs: Reviewed and stable  Last Vitals:  Filed Vitals:   04/28/14 1057  BP: 142/75  Pulse: 80  Temp: 36.8 C  Resp: 18    Complications: No apparent anesthesia complications

## 2014-04-28 NOTE — Op Note (Signed)
OPERATIVE REPORT    DATE OF PROCEDURE:  04/28/2014       PREOPERATIVE DIAGNOSIS:  LEFT HIP OSTEOARTHROSCOPY                                                          POSTOPERATIVE DIAGNOSIS:  LEFT HIP OSTEOARTHROSCOPY                                                           PROCEDURE:  L total hip arthroplasty using a 52 mm DePuy Pinnacle  Cup, [employer], 10-degree polyethylene liner index superior  and posterior, a +0 36 mm ceramic head, a 18x13x150x42 SROM stem, 18Fsm Sleeve   SURGEON: Allisha Harter J    ASSISTANT:   Eric K. Barton Dubois  (present throughout entire procedure and necessary for timely completion of the procedure)   ANESTHESIA: General BLOOD LOSS: 300 FLUID REPLACEMENT: 1500 crystalloid DRAINS: Foley Catheter     INDICATIONS FOR PROCEDURE: A 74 y.o. year-old With  LEFT HIP OSTEOARTHRITIS   for 3 years, x-rays show bone-on-bone arthritic changes, with lateral subluxation of the femoral head. Despite conservative measures with observation, anti-inflammatory medicine, narcotics, use of a cane, has severe unremitting pain and can ambulate only a few blocks before resting.  Patient desires elective L total hip arthroplasty to decrease pain and increase function. The risks, benefits, and alternatives were discussed at length including but not limited to the risks of infection, bleeding, nerve injury, stiffness, blood clots, the need for revision surgery, cardiopulmonary complications, among others, and they were willing to proceed. Questions answered     PROCEDURE IN DETAIL: The patient was identified by armband,  received preoperative IV antibiotics in the holding area at [facility], taken to the operating room , appropriate anesthetic monitors  were attached and general endotracheal anesthesia induced. Foley catheter was inserted. Pt was rolled into the R lateral decubitus position and fixed there with a Stulberg Mark II pelvic clamp.  The L lower  extremity was then prepped and draped  in the usual sterile fashion from the ankle to the hemipelvis. A time-out  procedure was performed. The skin along the lateral hip and thigh  infiltrated with 10 mL of 0.5% Marcaine and epinephrine solution. We  then made a posterolateral approach to the hip. With a #10 blade, a 14 cm  incision was made through the skin and subcutaneous tissue down to the level of the  IT band. Small bleeders were identified and cauterized. The IT band was cut in  line with skin incision exposing the greater trochanter. A Cobra retractor was placed between the gluteus minimus and the superior hip joint capsule, and a spiked Cobra between the quadratus femoris and the inferior hip joint capsule. This isolated the short  external rotators and piriformis tendons. These were tagged with a #2 Ethibond  suture and cut off their insertion on the intertrochanteric crest. The posterior  capsule was then developed into an acetabular-based flap from Posterior Superior off of the acetabulum out over the femoral neck and back posterior inferior to the acetabular rim. This flap was tagged with  two #2 Ethibond sutures and retracted protecting the sciatic nerve. This exposed the arthritic femoral head and osteophytes. The hip was then flexed and internally rotated, dislocating the femoral head and a standard neck cut performed 1 fingerbreadth above the lesser trochanter.  A spiked Cobra was placed in the cotyloid notch and a Hohmann retractor was then used to lever the femur anteriorly off of the anterior pelvic column. A posterior-inferior wing retractor was placed at the junction of the acetabulum and the ischium completing the acetabular exposure.We then removed the peripheral osteophytes and labrum from the acetabulum. At this point topical Trans Am a casted 2 g and 150 mL of saline was placed into the wound with a standard sponge and then suctioned out after 2 minutes. We then reamed the  acetabulum up to 52 mm with basket reamers obtaining good coverage in all quadrants. We then irrigated with normal  saline solution and hammered into place a 52 mm pinnacle cup in 45  degrees of abduction and about 20 degrees of anteversion. More  peripheral osteophytes removed and a trial 10-degree liner placed with the  index superior-posterior. The hip was then flexed and internally rotated exposing the  proximal femur, which was entered with the initiating reamer followed by  the axial reamers up to a 13 mm full depth and 2mm partial depth. We then conically reamed to 15F to the correct depth for a 42 base neck. The calcar was milled to 15Fsm. A trial cone and stem was inserted in the 25 degrees anteversion, with a +0 56mm trial head. Trial reduction was then performed and excellent stability was noted with at 90 of flexion with 70 of internal rotation and then full extension with maximal external rotation. The hip could not be dislocated in full extension. The knee could easily flex  to about 130 degrees. We also stretched the abductors at this point,  because of the preexisting adductor contractures. All trial components  were then removed. The acetabulum was irrigated out with normal saline  solution. A titanium [facility] was then screwed into place  followed by a 10-degree polyethylene liner index superior-posterior. On  the femoral side a 15Fsm ZTT1 sleeve was hammered into place, followed by a 18x13x150x42 SROM stem in 25 degrees of anteversion. At this point, a +0 36 mm ceramic head was  hammered on the stem. The hip was reduced. We checked our stability  one more time and found it to be excellent. The wound was once again  thoroughly irrigated out with normal saline solution pulse lavage. The  capsular flap and short external rotators were repaired back to the  intertrochanteric crest through drill holes with a #2 Ethibond suture.  The IT band was closed with running 1  Vicryl suture. The subcutaneous  tissue with 0 and 2-0 undyed Vicryl suture and the skin with running  interlocking 3-0 nylon suture. Dressing of Aquacil was  then applied. The patient was then unclamped, rolled supine, awaken extubated and taken to recovery room without difficulty in stable condition.   Frederik Pear J 04/28/2014, 2:45 PM

## 2014-04-28 NOTE — Progress Notes (Signed)
Orthopedic Tech Progress Note Patient Details:  Luis Cisneros 12/03/1940 [id number] Applied OHF with trapeze to pt.'s bed.     Darrol Poke 04/28/2014, 6:11 PM

## 2014-04-28 NOTE — Anesthesia Preprocedure Evaluation (Addendum)
Anesthesia Evaluation  Patient identified by MRN, date of birth, ID band Patient awake    Reviewed: NPO status , Patient's Chart, lab work & pertinent test results  Airway Mallampati: II   Neck ROM: Full    Dental  (+) Dental Advisory Given, Teeth Intact   Pulmonary former smoker (quit 1989),  HX of PE breath sounds clear to auscultation        Cardiovascular hypertension, Pt. on medications Rhythm:Regular     Neuro/Psych Depression    GI/Hepatic negative GI ROS, Neg liver ROS,   Endo/Other  negative endocrine ROS  Renal/GU negative Renal ROS     Musculoskeletal  (+) Arthritis -, Osteoarthritis,    Abdominal (+)  Abdomen: soft.    Peds  Hematology 13/41   Anesthesia Other Findings Full beard  Reproductive/Obstetrics                           Anesthesia Physical Anesthesia Plan  ASA: II  Anesthesia Plan: General   Post-op Pain Management:    Induction: Intravenous  Airway Management Planned: Oral ETT  Additional Equipment:   Intra-op Plan:   Post-operative Plan: Extubation in OR  Informed Consent: I have reviewed the patients History and Physical, chart, labs and discussed the procedure including the risks, benefits and alternatives for the proposed anesthesia with the patient or authorized representative who has indicated his/her understanding and acceptance.     Plan Discussed with:   Anesthesia Plan Comments: (Multimodal pain rx)        Anesthesia Quick Evaluation

## 2014-04-28 NOTE — Interval H&P Note (Signed)
History and Physical Interval Note:  04/28/2014 1:01 PM  Luis Cisneros  has presented today for surgery, with the diagnosis of LEFT HIP OSTEOARTHROSCOPY  The various methods of treatment have been discussed with the patient and family. After consideration of risks, benefits and other options for treatment, the patient has consented to  Procedure(s): TOTAL HIP ARTHROPLASTY (Left) as a surgical intervention .  The patient's history has been reviewed, patient examined, no change in status, stable for surgery.  I have reviewed the patient's chart and labs.  Questions were answered to the patient's satisfaction.     Kerin Salen

## 2014-04-28 NOTE — Progress Notes (Signed)
Utilization review completed.  

## 2014-04-29 ENCOUNTER — Encounter (HOSPITAL_COMMUNITY): Payer: Self-pay | Admitting: General Practice

## 2014-04-29 LAB — BASIC METABOLIC PANEL
ANION GAP: 6 (ref 5–15)
BUN: 8 mg/dL (ref 6–23)
CALCIUM: 8.5 mg/dL (ref 8.4–10.5)
CO2: 29 mmol/L (ref 19–32)
Chloride: 100 mmol/L (ref 96–112)
Creatinine, Ser: 0.75 mg/dL (ref 0.50–1.35)
GFR, EST NON AFRICAN AMERICAN: 88 mL/min — AB (ref >90)
Glucose, Bld: 164 mg/dL — ABNORMAL HIGH (ref 70–99)
Potassium: 4.1 mmol/L (ref 3.5–5.1)
SODIUM: 135 mmol/L (ref 135–145)

## 2014-04-29 LAB — CBC
HCT: 38.2 % — ABNORMAL LOW (ref 39.0–52.0)
Hemoglobin: 12.6 g/dL — ABNORMAL LOW (ref 13.0–17.0)
MCH: 29.6 pg (ref 26.0–34.0)
MCHC: 33 g/dL (ref 30.0–36.0)
MCV: 89.9 fL (ref 78.0–100.0)
Platelets: 252 10*3/uL (ref 150–400)
RBC: 4.25 MIL/uL (ref 4.22–5.81)
RDW: 13.3 % (ref 11.5–15.5)
WBC: 9.4 10*3/uL (ref 4.0–10.5)

## 2014-04-29 NOTE — Progress Notes (Signed)
Patient ID: Luis Cisneros, male   DOB: 30-Jul-1940, 74 y.o.   MRN: [id number] PATIENT ID: Luis Cisneros  MRN: [id number]  DOB/AGE:  [birth year] / 74 y.o.  1 Day Post-Op Procedure(s) (LRB): TOTAL HIP ARTHROPLASTY (Left)    PROGRESS NOTE Subjective: Patient is alert, oriented, no Nausea, no Vomiting, yes passing gas, no Bowel Movement. Taking PO well. Denies SOB, Chest or Calf Pain. Using Incentive Spirometer, PAS in place. Ambulate WBAT Patient reports pain as 4 on 0-10 scale  .    Objective: Vital signs in last 24 hours: Filed Vitals:   04/28/14 1728 04/28/14 2054 04/29/14 0127 04/29/14 0546  BP: 142/85 131/72 125/77 145/75  Pulse: 81  99 98  Temp: 98 F (36.7 C) 98.5 F (36.9 C) 99.7 F (37.6 C) 99.5 F (37.5 C)  TempSrc: Oral Oral Oral Oral  Resp: 16  16 14   Height:      Weight:      SpO2: 99% 98% 98% 99%      Intake/Output from previous day: I/O last 3 completed shifts: In: 1850 [I.V.:1850] Out: 1875 [NOBSJ:6283; Blood:100]   Intake/Output this shift:     LABORATORY DATA:  Recent Labs  04/28/14 1800 04/29/14 0556  WBC  --  9.4  HGB  --  12.6*  HCT  --  38.2*  PLT  --  252  NA  --  135  K  --  4.1  CL  --  100  CO2  --  29  BUN  --  8  CREATININE 0.71 0.75  GLUCOSE  --  164*  CALCIUM  --  8.5    Examination: Neurologically intact ABD soft Neurovascular intact Sensation intact distally Intact pulses distally Dorsiflexion/Plantar flexion intact Incision: dressing C/D/I No cellulitis present Compartment soft} XR AP&Lat of hip shows well placed\fixed THA  Assessment:   1 Day Post-Op Procedure(s) (LRB): TOTAL HIP ARTHROPLASTY (Left) ADDITIONAL DIAGNOSIS:  Expected Acute Blood Loss Anemia, HTN, hx of PE 2006  Plan: PT/OT WBAT, THA  posterior precautions  DVT Prophylaxis: SCDx72 hrs, Lovenox x 2wks, hx of PE 2006 DISCHARGE PLAN: Home  DISCHARGE NEEDS: HHPT, HHRN, CPM, Walker and 3-in-1 comode seat

## 2014-04-29 NOTE — Evaluation (Signed)
Occupational Therapy Evaluation Patient Details Name: Luis Cisneros MRN: [id number] DOB: 1940/05/07 Today's Date: 04/29/2014    History of Present Illness Pt is a 74 y.o. male s/p Lt posterior THA.   Clinical Impression   Patient independent PTA. Patient currently requires up to total assist for LB ADLs, mod assist for bed mobility, min assist for functional mobility/transfers, set-up for UB ADLs and grooming tasks in seated position. Patient will benefit from acute OT to increase overall independence in the areas of ADLs, functional mobility, and overall safety in order to safely discharge home with wife.     Follow Up Recommendations  No OT follow up;Supervision/Assistance - 24 hour    Equipment Recommendations  3 in 1 bedside comode;Other (comment) (AE - reacher, sock aid, LH sponge, LH shoe horn)    Recommendations for Other Services  None at this time   Precautions / Restrictions Precautions Precautions: Posterior Hip;Fall Precaution Booklet Issued: Yes (comment) Precaution Comments: reviewed posterior hip precautions Restrictions Weight Bearing Restrictions: Yes LLE Weight Bearing: Weight bearing as tolerated      Mobility Bed Mobility Overal bed mobility: Needs Assistance Bed Mobility: Sit to Supine     Supine to sit: Mod assist;HOB elevated Sit to supine: Mod assist   General bed mobility comments: Patient required multiple cues for bed mobility of sit>supine. After cueing patient, patient fell straight back into bed. Reiterated importance of coming onto elbow>side>bringing feet into bed>rolling on back.   Transfers Overall transfer level: Needs assistance Equipment used: Rolling walker (2 wheeled) Transfers: Sit to/from Stand Sit to Stand: Min guard   General transfer comment: Cues for hand placement and overall safey    Balance Overall balance assessment: Needs assistance Sitting-balance support: Bilateral upper extremity supported;Feet supported Sitting  balance-Leahy Scale: Poor Sitting balance - Comments: requiring UE bracing at all times; very guarded sitting EOB; denied dizziness   Standing balance support: Bilateral upper extremity supported;During functional activity Standing balance-Leahy Scale: Poor Standing balance comment: RW to balance       ADL Overall ADL's : Needs assistance/impaired Eating/Feeding: Set up;Sitting   Grooming: Set up;Sitting   Upper Body Bathing: Set up;Sitting   Lower Body Bathing: Total assistance;Sit to/from stand;Cueing for safety   Upper Body Dressing : Set up;Sitting   Lower Body Dressing: Total assistance;Sit to/from stand;Cueing for safety   Toilet Transfer: Minimal assistance;RW;Ambulation;BSC     Functional mobility during ADLs: Min guard;Rolling walker General ADL Comments: Educated patient on use of AE to help increase overall independence with LB ADL tasks. Plan to introduce equipment and demonstrate during next OT session prior to patient discharging > home. Also plan to practice tub/shower transfer using BSC prior to patient discharging. Patient's wife independent to assist him prn once patient discharges from acute. Educated patient on bed mobility, however patient did not listen and performed bed mobility unsafely.     Pertinent Vitals/Pain Pain Assessment: 0-10 Pain Score: 5  Pain Location: left hip Pain Descriptors / Indicators: Sore Pain Intervention(s): Monitored during session;Repositioned     Hand Dominance Right   Extremity/Trunk Assessment Upper Extremity Assessment Upper Extremity Assessment: Overall WFL for tasks assessed   Lower Extremity Assessment Lower Extremity Assessment: LLE deficits/detail LLE Deficits / Details: quad 3/5 LLE Coordination: decreased gross motor   Cervical / Trunk Assessment Cervical / Trunk Assessment: Normal   Communication Communication Communication: No difficulties   Cognition Arousal/Alertness: Awake/alert Behavior During  Therapy: WFL for tasks assessed/performed Overall Cognitive Status: Impaired/Different from baseline Area of Impairment: Problem  solving;Safety/judgement     Memory: Decreased recall of precautions   Safety/Judgement: Decreased awareness of deficits;Decreased awareness of safety   Problem Solving: Difficulty sequencing;Requires verbal cues General Comments: Therapist educated patient on safe and effective way to perform functional transfers and tasks, patient still did so unsafely.               Home Living Family/patient expects to be discharged to:: Private residence Living Arrangements: Spouse/significant other Available Help at Discharge: Family;Available 24 hours/day Type of Home: House Home Access: Stairs to enter [employer] of Steps: 2-3 Entrance Stairs-Rails: Can reach both;Right;Left Home Layout: Able to live on main level with bedroom/bathroom;1/2 bath on main level;Two level Alternate Level Stairs-Number of Steps: flight   Bathroom Shower/Tub: Tub/shower unit;Curtain   Bathroom Toilet: Handicapped height     Home Equipment: Environmental consultant - 2 wheels;Bedside commode   Additional Comments: pt has tub shower upstairs; on main level he has a single bed       Prior Functioning/Environment Level of Independence: Independent     OT Diagnosis: Generalized weakness;Acute pain   OT Problem List: Decreased strength;Decreased range of motion;Decreased activity tolerance;Impaired balance (sitting and/or standing);Decreased safety awareness;Decreased knowledge of use of DME or AE;Decreased knowledge of precautions;Pain   OT Treatment/Interventions: Self-care/ADL training;Therapeutic exercise;Energy conservation;DME and/or AE instruction;Therapeutic activities;Patient/family education;Balance training    OT Goals(Current goals can be found in the care plan section) Acute Rehab OT Goals Patient Stated Goal: to go home tomorrow OT Goal Formulation: With  patient/family Time For Goal Achievement: 05/06/14 Potential to Achieve Goals: Good ADL Goals Pt Will Perform Grooming: with set-up;standing Pt Will Perform Lower Body Bathing: sit to/from stand;with adaptive equipment;with min assist Pt Will Perform Lower Body Dressing: with min assist;with adaptive equipment;sit to/from stand Pt Will Transfer to Toilet: with supervision;ambulating;bedside commode Pt Will Perform Tub/Shower Transfer: with min assist;Tub transfer;3 in 1;ambulating;rolling walker Additional ADL Goal #1: Patient and family will independently verbalize 3/3 posterior hip precautions 100% of the time AND patient will adhere to 3/3 posterior hip precautions 100% of the time  OT Frequency: Min 2X/week   Barriers to D/C: None known at this time          End of Session Equipment Utilized During Treatment: Rolling walker  Activity Tolerance: Patient tolerated treatment well Patient left: in bed;with call bell/phone within reach;with family/visitor present   Time: 4401-0272 OT Time Calculation (min): 25 min Charges:  OT General Charges $OT Visit: 1 Procedure OT Evaluation $Initial OT Evaluation Tier I: 1 Procedure OT Treatments $Self Care/Home Management : 8-22 mins  [place] Shannahan , MS, OTR/L, CLT Pager: [phone]  04/29/2014, 11:49 AM

## 2014-04-29 NOTE — Evaluation (Addendum)
Physical Therapy Evaluation Patient Details Name: Luis Cisneros MRN: [id number] DOB: [birth year] Today's Date: 04/29/2014   History of Present Illness  Pt is a 74 y.o. male s/p Lt posterior THA.  Clinical Impression  Pt is s/p Lt THA POD#1 resulting in the deficits listed below (see PT Problem List). Pt will benefit from skilled PT to increase their independence and safety with mobility to allow discharge to the venue listed below. Pt demo some difficulty problem solving during session. Demo increased difficulty with bed mobility.      Follow Up Recommendations Home health PT;Supervision/Assistance - 24 hour    Equipment Recommendations  None recommended by PT    Recommendations for Other Services OT consult     Precautions / Restrictions Precautions Precautions: Posterior Hip;Fall Precaution Booklet Issued: Yes (comment) Precaution Comments: reviewed posterior hip precautions Restrictions Weight Bearing Restrictions: Yes LLE Weight Bearing: Weight bearing as tolerated      Mobility  Bed Mobility Overal bed mobility: Needs Assistance Bed Mobility: Supine to Sit     Supine to sit: Mod assist;HOB elevated     General bed mobility comments: pt requiring max multimodal cues for bed mobility; pt demo difficulty sequencing and problem solving; mod (A) to elevate trunk and control Lt LE off EOB; using handrails and trapeze bar throughout   Transfers Overall transfer level: Needs assistance Equipment used: Rolling walker (2 wheeled) Transfers: Sit to/from Stand Sit to Stand: Min guard         General transfer comment: min guard to steady; cues for hand placement and posterior hip precautions  Ambulation/Gait Ambulation/Gait assistance: Min guard Ambulation Distance (Feet): 120 Feet Assistive device: Rolling walker (2 wheeled) Gait Pattern/deviations: Step-to pattern;Decreased stance time - left;Decreased step length - right;Antalgic;Narrow base of support;Trunk  flexed Gait velocity: decr Gait velocity interpretation: Below normal speed for age/gender General Gait Details: multimodal cues for safety and technique with RW; cues for step through gt  Stairs            Wheelchair Mobility    Modified Rankin (Stroke Patients Only)       Balance Overall balance assessment: Needs assistance Sitting-balance support: Feet supported;No upper extremity supported Sitting balance-Leahy Scale: Poor Sitting balance - Comments: requiring UE bracing at all times; very guarded sitting EOB; denied dizziness   Standing balance support: During functional activity;Bilateral upper extremity supported Standing balance-Leahy Scale: Poor Standing balance comment: RW to balance                              Pertinent Vitals/Pain Pain Assessment: 0-10 Pain Score: 5  Pain Location: left hip Pain Descriptors / Indicators: Sore Pain Intervention(s): Monitored during session;Repositioned    Home Living Family/patient expects to be discharged to:: Private residence Living Arrangements: Spouse/significant other Available Help at Discharge: Family;Available 24 hours/day Type of Home: House Home Access: Stairs to enter Entrance Stairs-Rails: Can reach both;Right;Left Entrance Stairs-Number of Steps: 2-3 Home Layout: Able to live on main level with bedroom/bathroom;1/2 bath on main level;Two level Home Equipment: Walker - 2 wheels;Bedside commode Additional Comments: pt has tub shower upstairs; on main level he has a single bed     Prior Function Level of Independence: Independent               Hand Dominance   Dominant Hand: Right    Extremity/Trunk Assessment   Upper Extremity Assessment: Overall WFL for tasks assessed  Lower Extremity Assessment: LLE deficits/detail   LLE Deficits / Details: quad 3/5  Cervical / Trunk Assessment: Normal  Communication   Communication: No difficulties  Cognition  Arousal/Alertness: Awake/alert Behavior During Therapy: WFL for tasks assessed/performed Overall Cognitive Status: Impaired/Different from baseline Area of Impairment: Problem solving;Safety/judgement     Memory: Decreased recall of precautions   Safety/Judgement: Decreased awareness of deficits;Decreased awareness of safety   Problem Solving: Difficulty sequencing;Requires verbal cues General Comments: Therapist educated patient on safe and effective way to perform functional transfers and tasks, patient still did so unsafely.     General Comments General comments (skin integrity, edema, etc.): educated to call nursing for OOB activities    Exercises Total Joint Exercises Ankle Circles/Pumps: AROM;Both;10 reps Gluteal Sets: Both;10 reps Hip ABduction/ADduction: AAROM;Left;10 reps Long Arc Quad: AROM;Left;10 reps      Assessment/Plan    PT Assessment Patient needs continued PT services  PT Diagnosis Difficulty walking;Generalized weakness;Acute pain   PT Problem List Decreased strength;Decreased range of motion;Decreased activity tolerance;Decreased mobility;Decreased balance;Decreased knowledge of use of DME;Decreased safety awareness;Decreased knowledge of precautions;Pain  PT Treatment Interventions DME instruction;Gait training;Stair training;Therapeutic activities;Functional mobility training;Balance training;Therapeutic exercise;Neuromuscular re-education;Patient/family education   PT Goals (Current goals can be found in the Care Plan section) Acute Rehab PT Goals Patient Stated Goal: to go home tomorrow PT Goal Formulation: With patient Time For Goal Achievement: 05/06/14 Potential to Achieve Goals: Good    Frequency 7X/week   Barriers to discharge        Co-evaluation               End of Session Equipment Utilized During Treatment: Gait belt Activity Tolerance: Patient tolerated treatment well Patient left: in chair;with call bell/phone within  reach;with family/visitor present Nurse Communication: Mobility status;Precautions;Weight bearing status         Time: 1000-1029 PT Time Calculation (min) (ACUTE ONLY): 29 min   Charges:   PT Evaluation $Initial PT Evaluation Tier I: 1 Procedure PT Treatments $Gait Training: 8-22 mins   PT G CodesGustavus Bryant,  [place]  [phone] 04/29/2014, 11:44 AM

## 2014-04-29 NOTE — Care Management (Addendum)
CARE MANAGEMENT NOTE 04/29/2014  Patient:  KYSHAUN, BARNETTE   Account Number:  [id number]  Date Initiated:  04/29/2014  Documentation initiated by:  Ricki Miller  Subjective/Objective Assessment:   74 yr old male admitted with Osteoarthritis of left hip. Patient underwent a left total hip arthroplasty.     Action/Plan:   Case manager spoke with patient and wife concerning home health and DME needs. Preoperatively setup with [facility], no changes. Patient has RW 3in1. has family support at discharge.   Anticipated DC Date:  04/29/2014   Anticipated DC Plan:  [place]  CM consult      [facility] Choice  HOME HEALTH   Choice offered to / List presented to:  C-3 Spouse   DME arranged  NA        [place] arranged  HH-2 PT      [place]   Status of service:  Completed, signed off Medicare Important Message given?  NA - LOS <3 / Initial given by admissions (If response is "NO", the following Medicare IM given date fields will be blank) Date Medicare IM given:   Medicare IM given by:   Date Additional Medicare IM given:   Additional Medicare IM given by:    Discharge Disposition:  [place]  Per UR Regulation:  Reviewed for med. necessity/level of care/duration of stay

## 2014-04-29 NOTE — Progress Notes (Signed)
Physical Therapy Treatment Patient Details Name: Luis Cisneros MRN: [id number] DOB: 1940-11-24 Today's Date: 04/29/2014    History of Present Illness Pt is a 74 y.o. male s/p Lt posterior THA.    PT Comments    Progressing well with ambulation. Still needs cues/assistance for bed mobility. Would focus on supine to sit next visit as well as attempting steps. Continue with current POC  Follow Up Recommendations  Home health PT;Supervision/Assistance - 24 hour     Equipment Recommendations  None recommended by PT    Recommendations for Other Services       Precautions / Restrictions Precautions Precautions: Posterior Hip;Fall Precaution Comments: reviewed posterior hip precautions Restrictions Weight Bearing Restrictions: Yes LLE Weight Bearing: Weight bearing as tolerated    Mobility  Bed Mobility Overal bed mobility: Needs Assistance Bed Mobility: Sit to Supine     Supine to sit: Mod assist;HOB elevated Sit to supine: Mod assist   General bed mobility comments: Mod A for LEs and to support trunk into sitting  Transfers Overall transfer level: Needs assistance Equipment used: Rolling walker (2 wheeled) Transfers: Sit to/from Stand Sit to Stand: Min guard         General transfer comment: Cues for hand placement and overall safey  Ambulation/Gait Ambulation/Gait assistance: Min guard Ambulation Distance (Feet): 210 Feet Assistive device: Rolling walker (2 wheeled) Gait Pattern/deviations: Step-through pattern;Decreased stride length Gait velocity: WFL   General Gait Details: Patient with step through pattern. Cues for safety with RW   Stairs            Wheelchair Mobility    Modified Rankin (Stroke Patients Only)       Balance Overall balance assessment: Needs assistance Sitting-balance support: Bilateral upper extremity supported;Feet supported Sitting balance-Leahy Scale: Poor     Standing balance support: Bilateral upper extremity  supported;During functional activity Standing balance-Leahy Scale: Poor                      Cognition Arousal/Alertness: Awake/alert Behavior During Therapy: WFL for tasks assessed/performed Overall Cognitive Status: Within Functional Limits for tasks assessed Area of Impairment: Problem solving;Safety/judgement     Memory: Decreased recall of precautions   Safety/Judgement: Decreased awareness of deficits;Decreased awareness of safety   Problem Solving: Difficulty sequencing;Requires verbal cues General Comments: Therapist educated patient on safe and effective way to perform functional transfers and tasks, patient still did so unsafely.     Exercises      General Comments        Pertinent Vitals/Pain Pain Assessment: 0-10 Pain Score: 3  Pain Location: l hip Pain Descriptors / Indicators: Sore Pain Intervention(s): Monitored during session    Home Living Family/patient expects to be discharged to:: Private residence Living Arrangements: Spouse/significant other Available Help at Discharge: Family;Available 24 hours/day Type of Home: House Home Access: Stairs to enter Entrance Stairs-Rails: Can reach both;Right;Left Home Layout: Able to live on main level with bedroom/bathroom;1/2 bath on main level;Two level Home Equipment: Walker - 2 wheels;Bedside commode      Prior Function Level of Independence: Independent          PT Goals (current goals can now be found in the care plan section) Acute Rehab PT Goals Patient Stated Goal: to go home tomorrow Progress towards PT goals: Progressing toward goals    Frequency  7X/week    PT Plan Current plan remains appropriate    Co-evaluation             End  of Session Equipment Utilized During Treatment: Gait belt Activity Tolerance: Patient tolerated treatment well Patient left: in chair;with call bell/phone within reach;with family/visitor present     Time: 1165-7903 PT Time Calculation (min)  (ACUTE ONLY): 17 min  Charges:  $Gait Training: 8-22 mins                    G Codes:      Jacqualyn Rahn 04/29/2014, 3:11 PM  04/29/2014 Jacqualyn Jay PTA [phone] pager [phone] office

## 2014-04-30 LAB — CBC
HCT: 36.1 % — ABNORMAL LOW (ref 39.0–52.0)
HEMOGLOBIN: 11.7 g/dL — AB (ref 13.0–17.0)
MCH: 29 pg (ref 26.0–34.0)
MCHC: 32.4 g/dL (ref 30.0–36.0)
MCV: 89.6 fL (ref 78.0–100.0)
PLATELETS: 280 10*3/uL (ref 150–400)
RBC: 4.03 MIL/uL — AB (ref 4.22–5.81)
RDW: 13.6 % (ref 11.5–15.5)
WBC: 13.2 10*3/uL — AB (ref 4.0–10.5)

## 2014-04-30 NOTE — Progress Notes (Addendum)
Occupational Therapy Treatment Patient Details Name: NESTOR WIENEKE MRN: [id number] DOB: [birth year] Today's Date: 04/30/2014    History of present illness Pt is a 74 y.o. male s/p Lt posterior THA.   OT comments  Pt. Progressing well with skilled OT and is clear for d/c.  Demonstrating increased independence with bed mobility this session.  Able to return demo of tub transfer, and using A/E for LB ADLS.  Wife present and very active during session.  Both had no further questions, will alert OTR/L for d/c from OT.  Follow Up Recommendations  No OT follow up;Supervision/Assistance - 24 hour    Equipment Recommendations  3 in 1 bedside comode;Other (comment)    Recommendations for Other Services      Precautions / Restrictions Precautions Precautions: Posterior Hip;Fall Precaution Comments: pt. able to state and properly incorporate all precautions Restrictions Weight Bearing Restrictions: Yes LLE Weight Bearing: Weight bearing as tolerated       Mobility Bed Mobility Overal bed mobility: Needs Assistance Bed Mobility: Supine to Sit     Supine to sit: Min guard     General bed mobility comments: hob flat, no rails pt. required very little assist to support trunk while navigating b les out of bed and transitioning into sitting  Transfers Overall transfer level: Needs assistance Equipment used: Rolling walker (2 wheeled) Transfers: Sit to/from [facility] Sit to Stand: Supervision Stand pivot transfers: Supervision            Balance                                   ADL Overall ADL's : Needs assistance/impaired             Lower Body Bathing: Min guard;Sit to/from stand;With adaptive equipment       Lower Body Dressing: Set up;Sit to/from stand;Sitting/lateral leans;With adaptive equipment   Toilet Transfer: Supervision/safety;RW;Ambulation       Tub/ Shower Transfer: Tub transfer;Min guard;Shower seat;Grab bars;Rolling  walker;Cueing for sequencing   Functional mobility during ADLs: Min guard;Rolling walker General ADL Comments: pt. able to demonstrate safe tub transfer using his own technique.  has grab bars and 3n1 he can sit on, wife present and also educated on safe transfer tech.  improved bed mobility today, also introduced a/e and pt. able to use without assistance      Vision                     Perception     Praxis      Cognition   Behavior During Therapy: [facility] for tasks assessed/performed Overall Cognitive Status: Within Functional Limits for tasks assessed                       Extremity/Trunk Assessment               Exercises     Shoulder Instructions       General Comments      Pertinent Vitals/ Pain       Pain Assessment: No/denies pain  Home Living                                          Prior Functioning/Environment  Frequency Min 2X/week     Progress Toward Goals  OT Goals(current goals can now be found in the care plan section)  Progress towards OT goals: Goals met/education completed, patient discharged from [place] Discharge plan remains appropriate    Co-evaluation                 End of Session Equipment Utilized During Treatment: Rolling walker;Gait belt   Activity Tolerance Patient tolerated treatment well   Patient Left     Nurse Communication          Time: 5258-9483 OT Time Calculation (min): 39 min  Charges: OT Treatments $Self Care/Home Management : 38-52 mins  Janice Coffin, COTA/L 04/30/2014, 9:03 AM

## 2014-04-30 NOTE — Progress Notes (Signed)
Magdalene Molly discharged to [facility] place per MD order. Discharge instructions reviewed and discussed with patient. All questions and concerns answered. Copy of instructions and scripts given to patient. IV removed.  Patient transported by ambulance. No distress noted upon discharge.   Esaw Dace 04/30/2014 3:43 PM

## 2014-04-30 NOTE — Progress Notes (Signed)
PATIENT ID: Luis Cisneros  MRN: [id number]  DOB/AGE:  Jul 01, 1940 / 74 y.o.  2 Days Post-Op Procedure(s) (LRB): TOTAL HIP ARTHROPLASTY (Left)    PROGRESS NOTE Subjective: Patient is alert, oriented, no Nausea, no Vomiting, yes passing gas, no Bowel Movement. Taking PO well. Denies SOB, Chest or Calf Pain. Using Incentive Spirometer, PAS in place. Ambulate WBAT with pt walking 210 feet yesterday. Patient reports pain as mild  .    Objective: Vital signs in last 24 hours: Filed Vitals:   04/29/14 2100 04/30/14 0000 04/30/14 0400 04/30/14 0500  BP: 128/74   137/69  Pulse: 111   95  Temp: 99.1 F (37.3 C)   99.6 F (37.6 C)  TempSrc:      Resp: 16 17 17 16   Height:      Weight:      SpO2: 94%   94%      Intake/Output from previous day: I/O last 3 completed shifts: In: -  Out: 2150 [Urine:2150]   Intake/Output this shift:     LABORATORY DATA:  Recent Labs  04/28/14 1800 04/29/14 0556 04/30/14 0501  WBC  --  9.4 13.2*  HGB  --  12.6* 11.7*  HCT  --  38.2* 36.1*  PLT  --  252 280  NA  --  135  --   K  --  4.1  --   CL  --  100  --   CO2  --  29  --   BUN  --  8  --   CREATININE 0.71 0.75  --   GLUCOSE  --  164*  --   CALCIUM  --  8.5  --     Examination: Neurologically intact Neurovascular intact Sensation intact distally Intact pulses distally Dorsiflexion/Plantar flexion intact Incision: dressing C/D/I No cellulitis present Compartment soft} XR AP&Lat of hip shows well placed\fixed THA  Assessment:   2 Days Post-Op Procedure(s) (LRB): TOTAL HIP ARTHROPLASTY (Left) ADDITIONAL DIAGNOSIS:  Expected Acute Blood Loss Anemia, Hypertension and Hx of PE 2006  Plan: PT/OT WBAT, THA  posterior precautions  DVT Prophylaxis: SCDx72 hrs, Lovenox x 2wks, hx of PE 2006  DISCHARGE PLAN: Home, today once he passes his therapy goals.  DISCHARGE NEEDS: HHPT, HHRN, Walker and 3-in-1 comode seat

## 2014-04-30 NOTE — Discharge Summary (Signed)
Patient ID: SPARSH CALLENS MRN: [id number] DOB/AGE: [birth year] 74 y.o.  Admit date: 04/28/2014 Discharge date: 04/30/2014  Admission Diagnoses:  Active Problems:   Arthritis, hip   Discharge Diagnoses:  Same  Past Medical History  Diagnosis Date  . Pulmonary embolism   . Hyperlipidemia   . Hypertension   . Vitamin D deficiency   . Depression   . Arthritis   . Ankle fracture 2006    right    Surgeries: Procedure(s): TOTAL HIP ARTHROPLASTY on 04/28/2014   Consultants:    Discharged Condition: Improved  Hospital Course: HARDIE VELTRE is an 74 y.o. male who was admitted 04/28/2014 for operative treatment of<principal problem not specified>. Patient has severe unremitting pain that affects sleep, daily activities, and work/hobbies. After pre-op clearance the patient was taken to the operating room on 04/28/2014 and underwent  Procedure(s): TOTAL HIP ARTHROPLASTY.    Patient was given perioperative antibiotics: Anti-infectives    Start     Dose/Rate Route Frequency Ordered Stop   04/28/14 0600  vancomycin (VANCOCIN) IVPB 1000 mg/200 mL premix     1,000 mg 200 mL/hr over 60 Minutes Intravenous On call to O.R. 04/27/14 1418 04/28/14 1237       Patient was given sequential compression devices, early ambulation, and chemoprophylaxis to prevent DVT.  Patient benefited maximally from hospital stay and there were no complications.    Recent vital signs: Patient Vitals for the past 24 hrs:  BP Temp Temp src Pulse Resp SpO2  04/30/14 0500 137/69 mmHg 99.6 F (37.6 C) - 95 16 94 %  04/30/14 0400 - - - - 17 -  04/30/14 0000 - - - - 17 -  04/29/14 2100 128/74 mmHg 99.1 F (37.3 C) - (!) 111 16 94 %  04/29/14 2000 - - - - 17 -  04/29/14 1300 127/75 mmHg 100.2 F (37.9 C) Oral (!) 109 16 95 %     Recent laboratory studies:  Recent Labs  04/28/14 1800 04/29/14 0556 04/30/14 0501  WBC  --  9.4 13.2*  HGB  --  12.6* 11.7*  HCT  --  38.2* 36.1*  PLT  --  252 280  NA  --  135   --   K  --  4.1  --   CL  --  100  --   CO2  --  29  --   BUN  --  8  --   CREATININE 0.71 0.75  --   GLUCOSE  --  164*  --   CALCIUM  --  8.5  --      Discharge Medications:     Medication List    STOP taking these medications        meloxicam 15 MG tablet  Commonly known as:  MOBIC      TAKE these medications        amLODipine 5 MG tablet  Commonly known as:  NORVASC  TAKE 1 TABLET (5 MG TOTAL) BY MOUTH DAILY.     aspirin 81 MG tablet  Take 81 mg by mouth daily.     buPROPion 75 MG tablet  Commonly known as:  WELLBUTRIN  Take 1 tablet (75 mg total) by mouth 2 (two) times daily.     enoxaparin 40 MG/0.4ML injection  Commonly known as:  LOVENOX  Inject 0.4 mLs (40 mg total) into the skin daily.     fish oil-omega-3 fatty acids 1000 MG capsule  Take 2 g by mouth daily.  multivitamin tablet  Take 1 tablet by mouth daily.     oxyCODONE-acetaminophen 5-325 MG per tablet  Commonly known as:  ROXICET  Take 1 tablet by mouth every 4 (four) hours as needed.     simvastatin 40 MG tablet  Commonly known as:  ZOCOR  TAKE 1 TABLET EVERY DAY FOR CHOLESTEROL     tizanidine 2 MG capsule  Commonly known as:  ZANAFLEX  Take 1 capsule (2 mg total) by mouth 3 (three) times daily.     traMADol 50 MG tablet  Commonly known as:  ULTRAM  Take 1 tablet (50 mg total) by mouth every 6 (six) hours as needed.     VITAMIN D-3 PO  Take by mouth.      ASK your doctor about these medications        sildenafil 100 MG tablet  Commonly known as:  VIAGRA  Take 1 tablet (100 mg total) by mouth as needed for erectile dysfunction. May take 1/2 to one tablet prn        Diagnostic Studies: Dg Chest 2 View  04/16/2014   CLINICAL DATA:  Preoperative respiratory exam for left hip arthroplasty.  EXAM: CHEST - 2 VIEW  COMPARISON:  CT of the chest on 11/17/2009  FINDINGS: The heart size and mediastinal contours are within normal limits. Mildly prominent pulmonary interstitium without  edema or fibrosis. There is no evidence of pulmonary consolidation, pneumothorax, nodule or pleural fluid. The bony thorax shows spondylosis of the thoracic spine.  IMPRESSION: No active disease. Mildly prominent interstitium is suggestive of mild chronic lung disease.   Electronically Signed   By: Aletta Edouard M.D.   On: 04/16/2014 16:35   Dg Pelvis Portable  04/28/2014   CLINICAL DATA:  Postop left hip prosthesis.  EXAM: PORTABLE PELVIS 1-2 VIEWS  COMPARISON:  None.  FINDINGS: New left hip prosthesis is well-seated and aligned on the AP view is submitted. There is no acute fracture or evidence of an operative complication.  IMPRESSION: Well aligned left hip prosthesis.   Electronically Signed   By: Lajean Manes M.D.   On: 04/28/2014 17:24    Disposition:       Discharge Instructions    Call MD / Call 911    Complete by:  As directed   If you experience chest pain or shortness of breath, CALL 911 and be transported to the hospital emergency room.  If you develope a fever above 101 F, pus (white drainage) or increased drainage or redness at the wound, or calf pain, call your surgeon's office.     Change dressing    Complete by:  As directed   You may change your dressing on day 5, then change the dressing daily with sterile 4 x 4 inch gauze dressing and paper tape.  You may clean the incision with alcohol prior to redressing     Constipation Prevention    Complete by:  As directed   Drink plenty of fluids.  Prune juice may be helpful.  You may use a stool softener, such as Colace (over the counter) 100 mg twice a day.  Use MiraLax (over the counter) for constipation as needed.     Diet - low sodium heart healthy    Complete by:  As directed      Discharge instructions    Complete by:  As directed   Follow up in office with Dr. Mayer Camel in 2 weeks.     Driving restrictions  Complete by:  As directed   No driving for 2 weeks     Follow the hip precautions as taught in Physical Therapy     Complete by:  As directed      Increase activity slowly as tolerated    Complete by:  As directed      Patient may shower    Complete by:  As directed   You may shower without a dressing once there is no drainage.  Do not wash over the wound.  If drainage remains, cover wound with plastic wrap and then shower.           Follow-up Information    Follow up with Kerin Salen, MD In 2 weeks.   Specialty:  Orthopedic Surgery   Contact information:   [place] [address] [phone]        Signed: Theodosia Quay 04/30/2014, 8:58 AM

## 2014-04-30 NOTE — Progress Notes (Signed)
Physical Therapy Treatment Patient Details Name: Luis Cisneros MRN: [id number] DOB: [birth year] Today's Date: 04/30/2014    History of Present Illness Pt is a 74 y.o. male s/p Lt posterior THA.    PT Comments    Pt progressing towards PT goals.  Pt demonstrated safe navigation of stairs necessary to enter home.  Current plan to d/c home with intermittent supervision and home health PT remains appropriate per medical clearance and d/c.    Follow Up Recommendations  Home health PT;Supervision/Assistance - 24 hour     Equipment Recommendations  None recommended by PT    Recommendations for Other Services       Precautions / Restrictions Precautions Precautions: Posterior Hip;Fall Precaution Comments: Pt able recall no adduction precaution, min v'c's to reinforce no internal rotation with functional movement and no hip flexion past 90 degrees Restrictions Weight Bearing Restrictions: Yes LLE Weight Bearing: Weight bearing as tolerated    Mobility  Bed Mobility Overal bed mobility: Needs Assistance Bed Mobility: Supine to Sit     Supine to sit: Min guard     General bed mobility comments: Went over safe transfers, pt demonstrated understanding and ability to problem solve with precautions  Transfers Overall transfer level: Needs assistance Equipment used: Rolling walker (2 wheeled) Transfers: Sit to/from Stand Sit to Stand: Supervision Stand pivot transfers: Supervision       General transfer comment: Demonstrated proper hand placement for safe transfers  Ambulation/Gait Ambulation/Gait assistance: Supervision Ambulation Distance (Feet): 300 Feet Assistive device: Rolling walker (2 wheeled) Gait Pattern/deviations: Step-through pattern;Antalgic;Decreased stride length;Decreased weight shift to left Gait velocity: WFL   General Gait Details: Demonstrated step through pattern, fluidity of gait improved with movement, demonstrated safe and effective use or  RW   Stairs Stairs: Yes Stairs assistance: Min guard Stair Management: One rail Right Number of Stairs: 4 General stair comments: Demonstrated safe navigation of stairs, slight fatigue  Wheelchair Mobility    Modified Rankin (Stroke Patients Only)       Balance Overall balance assessment: Needs assistance         Standing balance support: Bilateral upper extremity supported Standing balance-Leahy Scale: Fair Standing balance comment: Use of UE for balance with ambulation and heavy reliance on rail during stair training                    Cognition Arousal/Alertness: Awake/alert Behavior During Therapy: WFL for tasks assessed/performed Overall Cognitive Status: Within Functional Limits for tasks assessed Area of Impairment: Safety/judgement (Still needs min v/c's for 2/3 precautions)     Memory: Decreased recall of precautions   Safety/Judgement: Decreased awareness of deficits;Decreased awareness of safety     General Comments: Overall problem solving is fine, still has minimal decreased awareness of precautions    Exercises      General Comments        Pertinent Vitals/Pain Pain Assessment: 0-10 Pain Score: 2  Pain Location: L hip near incision Pain Descriptors / Indicators: Sore Pain Intervention(s): Monitored during session    Home Living Family/patient expects to be discharged to:: Private residence Living Arrangements: Spouse/significant other Available Help at Discharge: Family;Available 24 hours/day Type of Home: House Home Access: Stairs to enter Entrance Stairs-Rails: Right Home Layout: Able to live on main level with bedroom/bathroom;1/2 bath on main level;Two level Home Equipment: Walker - 2 wheels;Bedside commode      Prior Function Level of Independence: Independent          PT Goals (current goals can now  be found in the care plan section) Progress towards PT goals: Progressing toward goals    Frequency  7X/week    PT  Plan Current plan remains appropriate    Co-evaluation             End of Session Equipment Utilized During Treatment: Gait belt Activity Tolerance: Patient tolerated treatment well Patient left: in chair;with call bell/phone within reach;with family/visitor present     Time:  -     Charges:                       G Codes:      Sorin Frimpong 2014/05/04, 12:22 PM  Lucas Mallow, SPT (student physical therapist) Office phone: [phone]

## 2014-05-01 DIAGNOSIS — Z86711 Personal history of pulmonary embolism: Secondary | ICD-10-CM | POA: Diagnosis not present

## 2014-05-01 DIAGNOSIS — M1990 Unspecified osteoarthritis, unspecified site: Secondary | ICD-10-CM | POA: Diagnosis not present

## 2014-05-01 DIAGNOSIS — Z471 Aftercare following joint replacement surgery: Secondary | ICD-10-CM | POA: Diagnosis not present

## 2014-05-01 DIAGNOSIS — E780 Pure hypercholesterolemia: Secondary | ICD-10-CM | POA: Diagnosis not present

## 2014-05-01 DIAGNOSIS — F329 Major depressive disorder, single episode, unspecified: Secondary | ICD-10-CM | POA: Diagnosis not present

## 2014-05-01 DIAGNOSIS — I10 Essential (primary) hypertension: Secondary | ICD-10-CM | POA: Diagnosis not present

## 2014-05-01 DIAGNOSIS — Z96642 Presence of left artificial hip joint: Secondary | ICD-10-CM | POA: Diagnosis not present

## 2014-05-03 ENCOUNTER — Encounter (HOSPITAL_COMMUNITY): Payer: Self-pay | Admitting: Orthopedic Surgery

## 2014-05-03 DIAGNOSIS — E780 Pure hypercholesterolemia: Secondary | ICD-10-CM | POA: Diagnosis not present

## 2014-05-03 DIAGNOSIS — F329 Major depressive disorder, single episode, unspecified: Secondary | ICD-10-CM | POA: Diagnosis not present

## 2014-05-03 DIAGNOSIS — M1990 Unspecified osteoarthritis, unspecified site: Secondary | ICD-10-CM | POA: Diagnosis not present

## 2014-05-03 DIAGNOSIS — I10 Essential (primary) hypertension: Secondary | ICD-10-CM | POA: Diagnosis not present

## 2014-05-03 DIAGNOSIS — Z86711 Personal history of pulmonary embolism: Secondary | ICD-10-CM | POA: Diagnosis not present

## 2014-05-03 DIAGNOSIS — Z471 Aftercare following joint replacement surgery: Secondary | ICD-10-CM | POA: Diagnosis not present

## 2014-05-03 DIAGNOSIS — Z96642 Presence of left artificial hip joint: Secondary | ICD-10-CM | POA: Diagnosis not present

## 2014-05-05 DIAGNOSIS — Z86711 Personal history of pulmonary embolism: Secondary | ICD-10-CM | POA: Diagnosis not present

## 2014-05-05 DIAGNOSIS — M1990 Unspecified osteoarthritis, unspecified site: Secondary | ICD-10-CM | POA: Diagnosis not present

## 2014-05-05 DIAGNOSIS — I10 Essential (primary) hypertension: Secondary | ICD-10-CM | POA: Diagnosis not present

## 2014-05-05 DIAGNOSIS — E780 Pure hypercholesterolemia: Secondary | ICD-10-CM | POA: Diagnosis not present

## 2014-05-05 DIAGNOSIS — F329 Major depressive disorder, single episode, unspecified: Secondary | ICD-10-CM | POA: Diagnosis not present

## 2014-05-05 DIAGNOSIS — Z471 Aftercare following joint replacement surgery: Secondary | ICD-10-CM | POA: Diagnosis not present

## 2014-05-05 DIAGNOSIS — Z96642 Presence of left artificial hip joint: Secondary | ICD-10-CM | POA: Diagnosis not present

## 2014-05-07 DIAGNOSIS — Z96642 Presence of left artificial hip joint: Secondary | ICD-10-CM | POA: Diagnosis not present

## 2014-05-07 DIAGNOSIS — Z86711 Personal history of pulmonary embolism: Secondary | ICD-10-CM | POA: Diagnosis not present

## 2014-05-07 DIAGNOSIS — I10 Essential (primary) hypertension: Secondary | ICD-10-CM | POA: Diagnosis not present

## 2014-05-07 DIAGNOSIS — M1990 Unspecified osteoarthritis, unspecified site: Secondary | ICD-10-CM | POA: Diagnosis not present

## 2014-05-07 DIAGNOSIS — E780 Pure hypercholesterolemia: Secondary | ICD-10-CM | POA: Diagnosis not present

## 2014-05-07 DIAGNOSIS — F329 Major depressive disorder, single episode, unspecified: Secondary | ICD-10-CM | POA: Diagnosis not present

## 2014-05-07 DIAGNOSIS — Z471 Aftercare following joint replacement surgery: Secondary | ICD-10-CM | POA: Diagnosis not present

## 2014-05-10 DIAGNOSIS — F329 Major depressive disorder, single episode, unspecified: Secondary | ICD-10-CM | POA: Diagnosis not present

## 2014-05-10 DIAGNOSIS — M1990 Unspecified osteoarthritis, unspecified site: Secondary | ICD-10-CM | POA: Diagnosis not present

## 2014-05-10 DIAGNOSIS — E780 Pure hypercholesterolemia: Secondary | ICD-10-CM | POA: Diagnosis not present

## 2014-05-10 DIAGNOSIS — Z471 Aftercare following joint replacement surgery: Secondary | ICD-10-CM | POA: Diagnosis not present

## 2014-05-10 DIAGNOSIS — I10 Essential (primary) hypertension: Secondary | ICD-10-CM | POA: Diagnosis not present

## 2014-05-10 DIAGNOSIS — Z96642 Presence of left artificial hip joint: Secondary | ICD-10-CM | POA: Diagnosis not present

## 2014-05-10 DIAGNOSIS — Z86711 Personal history of pulmonary embolism: Secondary | ICD-10-CM | POA: Diagnosis not present

## 2014-05-11 DIAGNOSIS — Z9889 Other specified postprocedural states: Secondary | ICD-10-CM | POA: Diagnosis not present

## 2014-05-12 DIAGNOSIS — I10 Essential (primary) hypertension: Secondary | ICD-10-CM | POA: Diagnosis not present

## 2014-05-12 DIAGNOSIS — Z96642 Presence of left artificial hip joint: Secondary | ICD-10-CM | POA: Diagnosis not present

## 2014-05-12 DIAGNOSIS — Z471 Aftercare following joint replacement surgery: Secondary | ICD-10-CM | POA: Diagnosis not present

## 2014-05-12 DIAGNOSIS — F329 Major depressive disorder, single episode, unspecified: Secondary | ICD-10-CM | POA: Diagnosis not present

## 2014-05-12 DIAGNOSIS — E780 Pure hypercholesterolemia: Secondary | ICD-10-CM | POA: Diagnosis not present

## 2014-05-12 DIAGNOSIS — Z86711 Personal history of pulmonary embolism: Secondary | ICD-10-CM | POA: Diagnosis not present

## 2014-05-12 DIAGNOSIS — M1990 Unspecified osteoarthritis, unspecified site: Secondary | ICD-10-CM | POA: Diagnosis not present

## 2014-05-14 DIAGNOSIS — M1990 Unspecified osteoarthritis, unspecified site: Secondary | ICD-10-CM | POA: Diagnosis not present

## 2014-05-14 DIAGNOSIS — F329 Major depressive disorder, single episode, unspecified: Secondary | ICD-10-CM | POA: Diagnosis not present

## 2014-05-14 DIAGNOSIS — E780 Pure hypercholesterolemia: Secondary | ICD-10-CM | POA: Diagnosis not present

## 2014-05-14 DIAGNOSIS — Z471 Aftercare following joint replacement surgery: Secondary | ICD-10-CM | POA: Diagnosis not present

## 2014-05-14 DIAGNOSIS — Z86711 Personal history of pulmonary embolism: Secondary | ICD-10-CM | POA: Diagnosis not present

## 2014-05-14 DIAGNOSIS — I10 Essential (primary) hypertension: Secondary | ICD-10-CM | POA: Diagnosis not present

## 2014-05-14 DIAGNOSIS — Z96642 Presence of left artificial hip joint: Secondary | ICD-10-CM | POA: Diagnosis not present

## 2014-05-18 DIAGNOSIS — F329 Major depressive disorder, single episode, unspecified: Secondary | ICD-10-CM | POA: Diagnosis not present

## 2014-05-18 DIAGNOSIS — Z471 Aftercare following joint replacement surgery: Secondary | ICD-10-CM | POA: Diagnosis not present

## 2014-05-18 DIAGNOSIS — M1990 Unspecified osteoarthritis, unspecified site: Secondary | ICD-10-CM | POA: Diagnosis not present

## 2014-05-18 DIAGNOSIS — Z86711 Personal history of pulmonary embolism: Secondary | ICD-10-CM | POA: Diagnosis not present

## 2014-05-18 DIAGNOSIS — E780 Pure hypercholesterolemia: Secondary | ICD-10-CM | POA: Diagnosis not present

## 2014-05-18 DIAGNOSIS — I10 Essential (primary) hypertension: Secondary | ICD-10-CM | POA: Diagnosis not present

## 2014-05-18 DIAGNOSIS — Z96642 Presence of left artificial hip joint: Secondary | ICD-10-CM | POA: Diagnosis not present

## 2014-05-20 DIAGNOSIS — Z86711 Personal history of pulmonary embolism: Secondary | ICD-10-CM | POA: Diagnosis not present

## 2014-05-20 DIAGNOSIS — Z96642 Presence of left artificial hip joint: Secondary | ICD-10-CM | POA: Diagnosis not present

## 2014-05-20 DIAGNOSIS — M1990 Unspecified osteoarthritis, unspecified site: Secondary | ICD-10-CM | POA: Diagnosis not present

## 2014-05-20 DIAGNOSIS — E780 Pure hypercholesterolemia: Secondary | ICD-10-CM | POA: Diagnosis not present

## 2014-05-20 DIAGNOSIS — F329 Major depressive disorder, single episode, unspecified: Secondary | ICD-10-CM | POA: Diagnosis not present

## 2014-05-20 DIAGNOSIS — I10 Essential (primary) hypertension: Secondary | ICD-10-CM | POA: Diagnosis not present

## 2014-05-20 DIAGNOSIS — Z471 Aftercare following joint replacement surgery: Secondary | ICD-10-CM | POA: Diagnosis not present

## 2014-05-24 DIAGNOSIS — M25652 Stiffness of left hip, not elsewhere classified: Secondary | ICD-10-CM | POA: Diagnosis not present

## 2014-05-24 DIAGNOSIS — Z96642 Presence of left artificial hip joint: Secondary | ICD-10-CM | POA: Diagnosis not present

## 2014-05-24 DIAGNOSIS — M25552 Pain in left hip: Secondary | ICD-10-CM | POA: Diagnosis not present

## 2014-06-01 ENCOUNTER — Other Ambulatory Visit: Payer: Medicare Other | Admitting: Internal Medicine

## 2014-06-01 DIAGNOSIS — Z79899 Other long term (current) drug therapy: Secondary | ICD-10-CM | POA: Diagnosis not present

## 2014-06-01 DIAGNOSIS — E785 Hyperlipidemia, unspecified: Secondary | ICD-10-CM

## 2014-06-02 LAB — LIPID PANEL
CHOLESTEROL: 190 mg/dL (ref 0–200)
HDL: 71 mg/dL (ref >=40)
LDL CALC: 100 mg/dL — AB (ref 0–99)
Total CHOL/HDL Ratio: 2.7 Ratio
Triglycerides: 93 mg/dL (ref <150)
VLDL: 19 mg/dL (ref 0–40)

## 2014-06-02 LAB — HEPATIC FUNCTION PANEL
ALK PHOS: 85 U/L (ref 39–117)
ALT: 19 U/L (ref 0–53)
AST: 19 U/L (ref 0–37)
Albumin: 3.5 g/dL (ref 3.5–5.2)
BILIRUBIN DIRECT: 0.1 mg/dL (ref 0.0–0.3)
Indirect Bilirubin: 0.5 mg/dL (ref 0.2–1.2)
TOTAL PROTEIN: 6.6 g/dL (ref 6.0–8.3)
Total Bilirubin: 0.6 mg/dL (ref 0.2–1.2)

## 2014-06-03 DIAGNOSIS — Z96642 Presence of left artificial hip joint: Secondary | ICD-10-CM | POA: Diagnosis not present

## 2014-06-03 DIAGNOSIS — M25552 Pain in left hip: Secondary | ICD-10-CM | POA: Diagnosis not present

## 2014-06-03 DIAGNOSIS — M25652 Stiffness of left hip, not elsewhere classified: Secondary | ICD-10-CM | POA: Diagnosis not present

## 2014-06-04 ENCOUNTER — Ambulatory Visit: Payer: Medicare Other | Admitting: Internal Medicine

## 2014-06-07 ENCOUNTER — Encounter: Payer: Self-pay | Admitting: Internal Medicine

## 2014-06-07 ENCOUNTER — Ambulatory Visit (INDEPENDENT_AMBULATORY_CARE_PROVIDER_SITE_OTHER): Payer: Medicare Other | Admitting: Internal Medicine

## 2014-06-07 VITALS — BP 132/70 | HR 70 | Temp 98.6°F | Wt 194.0 lb

## 2014-06-07 DIAGNOSIS — Z23 Encounter for immunization: Secondary | ICD-10-CM

## 2014-06-07 DIAGNOSIS — I10 Essential (primary) hypertension: Secondary | ICD-10-CM | POA: Diagnosis not present

## 2014-06-07 DIAGNOSIS — M25652 Stiffness of left hip, not elsewhere classified: Secondary | ICD-10-CM | POA: Diagnosis not present

## 2014-06-07 DIAGNOSIS — E785 Hyperlipidemia, unspecified: Secondary | ICD-10-CM

## 2014-06-07 DIAGNOSIS — Z9660 Presence of unspecified orthopedic joint implant: Secondary | ICD-10-CM | POA: Diagnosis not present

## 2014-06-07 DIAGNOSIS — M25552 Pain in left hip: Secondary | ICD-10-CM | POA: Diagnosis not present

## 2014-06-07 DIAGNOSIS — Z96642 Presence of left artificial hip joint: Secondary | ICD-10-CM | POA: Diagnosis not present

## 2014-06-07 NOTE — Patient Instructions (Signed)
Prevnar vaccine received. Return in 6 months for physical examination. Continue same medications.

## 2014-06-07 NOTE — Progress Notes (Signed)
   Subjective:    Patient ID: Luis Cisneros, male    DOB: May 08, 1940, 74 y.o.   MRN: [id number]  HPI Had left hip replacement surgery 323 by Dr. Malva Cogan and looks great. Ambulating without a cane. Still taking some therapy. Lipid panel is stable as are liver functions. Blood pressure is excellent. No new complaints or problems. LDL cholesterol is 100 which I think is excellent. He's not been as active recently.    Review of Systems     Objective:   Physical Exam  Skin warm and dry. Nodes none. Neck is supple without JVD thyromegaly or carotid bruits. Chest clear to auscultation. Cardiac exam regular rate and rhythm normal S1 and S2. Extremities without edema      Assessment & Plan:  Essential hypertension-stable on current regimen  Hyperlipidemia-stable on statin medication  Recent left hip replacement-March 23 and doing well  Plan: Return in 6 months for physical examination. Prevnar vaccine given today.

## 2014-06-08 DIAGNOSIS — Z96642 Presence of left artificial hip joint: Secondary | ICD-10-CM | POA: Diagnosis not present

## 2014-06-08 DIAGNOSIS — Z471 Aftercare following joint replacement surgery: Secondary | ICD-10-CM | POA: Diagnosis not present

## 2014-06-14 DIAGNOSIS — Z96642 Presence of left artificial hip joint: Secondary | ICD-10-CM | POA: Diagnosis not present

## 2014-06-14 DIAGNOSIS — M25552 Pain in left hip: Secondary | ICD-10-CM | POA: Diagnosis not present

## 2014-06-14 DIAGNOSIS — M25652 Stiffness of left hip, not elsewhere classified: Secondary | ICD-10-CM | POA: Diagnosis not present

## 2014-06-21 DIAGNOSIS — M25552 Pain in left hip: Secondary | ICD-10-CM | POA: Diagnosis not present

## 2014-06-21 DIAGNOSIS — M25652 Stiffness of left hip, not elsewhere classified: Secondary | ICD-10-CM | POA: Diagnosis not present

## 2014-06-21 DIAGNOSIS — Z96642 Presence of left artificial hip joint: Secondary | ICD-10-CM | POA: Diagnosis not present

## 2014-06-22 ENCOUNTER — Other Ambulatory Visit: Payer: Self-pay | Admitting: *Deleted

## 2014-06-22 MED ORDER — MELOXICAM 15 MG PO TABS: 15.0000 mg | ORAL_TABLET | Freq: Every day | ORAL | Status: DC

## 2014-08-25 ENCOUNTER — Encounter: Payer: Self-pay | Admitting: Sports Medicine

## 2014-08-25 ENCOUNTER — Ambulatory Visit (INDEPENDENT_AMBULATORY_CARE_PROVIDER_SITE_OTHER): Payer: Medicare Other | Admitting: Sports Medicine

## 2014-08-25 VITALS — BP 137/67 | HR 84 | Ht 69.0 in | Wt 196.0 lb

## 2014-08-25 DIAGNOSIS — M1990 Unspecified osteoarthritis, unspecified site: Secondary | ICD-10-CM

## 2014-08-25 DIAGNOSIS — M1610 Unilateral primary osteoarthritis, unspecified hip: Secondary | ICD-10-CM

## 2014-08-25 MED ORDER — TRAMADOL HCL 50 MG PO TABS: 50.0000 mg | ORAL_TABLET | Freq: Four times a day (QID) | ORAL | Status: DC | PRN

## 2014-08-25 MED ORDER — MELOXICAM 15 MG PO TABS: 15.0000 mg | ORAL_TABLET | Freq: Every day | ORAL | Status: DC

## 2014-08-25 NOTE — Assessment & Plan Note (Signed)
Has done well post THR  Keep using arch support for feet  Use meds as needed for pain  Steadily increase activity  Reck prn

## 2014-08-25 NOTE — Progress Notes (Signed)
Patient ID: Luis Cisneros, male   DOB: [birth year], 74 y.o.   MRN: [id number]  THR on left in March Has made good progress Now back working at carpentry  He has stopped narcotics Now gets by with either tramadol or meloxicam Uses meloxicam most days Tramadol has taken up to 4 on bad days but often just 1  Other joints ache with work and are stiff but no severe limits  Exam  NAD BP 137/67 mmHg  Pulse 84  Ht 5\' 9"  (1.753 m)  Wt 196 lb (88.905 kg)  BMI 28.93 kg/m2  Hip ROM Total is 50 to 60 deg on RT Left hip now has at least 60 deg while seated  Gait shows trendelenburg shift is not gone Still wilth slt less than full extension on LT  No pain with testing  Good overall strength

## 2014-09-09 DIAGNOSIS — M1612 Unilateral primary osteoarthritis, left hip: Secondary | ICD-10-CM | POA: Diagnosis not present

## 2014-09-19 ENCOUNTER — Other Ambulatory Visit: Payer: Self-pay | Admitting: Internal Medicine

## 2014-10-28 ENCOUNTER — Other Ambulatory Visit: Payer: Medicare Other | Admitting: Internal Medicine

## 2014-10-28 DIAGNOSIS — R5383 Other fatigue: Secondary | ICD-10-CM | POA: Diagnosis not present

## 2014-10-28 DIAGNOSIS — Z79899 Other long term (current) drug therapy: Secondary | ICD-10-CM | POA: Diagnosis not present

## 2014-10-28 DIAGNOSIS — Z125 Encounter for screening for malignant neoplasm of prostate: Secondary | ICD-10-CM | POA: Diagnosis not present

## 2014-10-28 DIAGNOSIS — E785 Hyperlipidemia, unspecified: Secondary | ICD-10-CM | POA: Diagnosis not present

## 2014-10-28 LAB — LIPID PANEL
CHOL/HDL RATIO: 2.6 ratio (ref <=5.0)
CHOLESTEROL: 173 mg/dL (ref 125–200)
HDL: 67 mg/dL (ref >=40)
LDL Cholesterol: 92 mg/dL (ref <130)
TRIGLYCERIDES: 68 mg/dL (ref <150)
VLDL: 14 mg/dL (ref <30)

## 2014-10-28 LAB — COMPLETE METABOLIC PANEL WITH GFR
ALBUMIN: 4 g/dL (ref 3.6–5.1)
ALK PHOS: 74 U/L (ref 40–115)
ALT: 31 U/L (ref 9–46)
AST: 29 U/L (ref 10–35)
BUN: 21 mg/dL (ref 7–25)
CALCIUM: 9.1 mg/dL (ref 8.6–10.3)
CHLORIDE: 103 mmol/L (ref 98–110)
CO2: 26 mmol/L (ref 20–31)
CREATININE: 0.73 mg/dL (ref 0.70–1.18)
GFR, Est African American: >89 mL/min (ref >=60)
GFR, Est Non African American: >89 mL/min (ref >=60)
Glucose, Bld: 94 mg/dL (ref 65–99)
Potassium: 4.5 mmol/L (ref 3.5–5.3)
Sodium: 141 mmol/L (ref 135–146)
Total Bilirubin: 0.7 mg/dL (ref 0.2–1.2)
Total Protein: 6.8 g/dL (ref 6.1–8.1)

## 2014-10-28 LAB — CBC WITH DIFFERENTIAL/PLATELET
Basophils Absolute: 0 10*3/uL (ref 0.0–0.1)
Basophils Relative: 0 % (ref 0–1)
EOS PCT: 3 % (ref 0–5)
Eosinophils Absolute: 0.1 10*3/uL (ref 0.0–0.7)
HEMATOCRIT: 40.6 % (ref 39.0–52.0)
Hemoglobin: 13.4 g/dL (ref 13.0–17.0)
LYMPHS ABS: 1.6 10*3/uL (ref 0.7–4.0)
LYMPHS PCT: 34 % (ref 12–46)
MCH: 28 pg (ref 26.0–34.0)
MCHC: 33 g/dL (ref 30.0–36.0)
MCV: 84.8 fL (ref 78.0–100.0)
MONO ABS: 0.6 10*3/uL (ref 0.1–1.0)
MPV: 8.6 fL (ref 8.6–12.4)
Monocytes Relative: 12 % (ref 3–12)
Neutro Abs: 2.3 10*3/uL (ref 1.7–7.7)
Neutrophils Relative %: 51 % (ref 43–77)
Platelets: 245 10*3/uL (ref 150–400)
RBC: 4.79 MIL/uL (ref 4.22–5.81)
RDW: 15.7 % — AB (ref 11.5–15.5)
WBC: 4.6 10*3/uL (ref 4.0–10.5)

## 2014-10-29 LAB — PSA, MEDICARE: PSA: 0.9 ng/mL (ref <=4.00)

## 2014-11-02 ENCOUNTER — Other Ambulatory Visit: Payer: Medicare Other | Admitting: Internal Medicine

## 2014-11-04 ENCOUNTER — Ambulatory Visit (INDEPENDENT_AMBULATORY_CARE_PROVIDER_SITE_OTHER): Payer: Medicare Other | Admitting: Internal Medicine

## 2014-11-04 ENCOUNTER — Encounter: Payer: Self-pay | Admitting: Internal Medicine

## 2014-11-04 VITALS — BP 124/82 | HR 84 | Temp 98.6°F | Ht 69.0 in | Wt 198.0 lb

## 2014-11-04 DIAGNOSIS — I10 Essential (primary) hypertension: Secondary | ICD-10-CM | POA: Diagnosis not present

## 2014-11-04 DIAGNOSIS — Z0000 Encounter for general adult medical examination without abnormal findings: Secondary | ICD-10-CM

## 2014-11-04 DIAGNOSIS — N529 Male erectile dysfunction, unspecified: Secondary | ICD-10-CM | POA: Diagnosis not present

## 2014-11-04 DIAGNOSIS — Z86711 Personal history of pulmonary embolism: Secondary | ICD-10-CM

## 2014-11-04 DIAGNOSIS — M7918 Myalgia, other site: Secondary | ICD-10-CM

## 2014-11-04 DIAGNOSIS — F338 Other recurrent depressive disorders: Secondary | ICD-10-CM

## 2014-11-04 DIAGNOSIS — Z96642 Presence of left artificial hip joint: Secondary | ICD-10-CM | POA: Diagnosis not present

## 2014-11-04 DIAGNOSIS — E785 Hyperlipidemia, unspecified: Secondary | ICD-10-CM | POA: Diagnosis not present

## 2014-11-04 DIAGNOSIS — M791 Myalgia: Secondary | ICD-10-CM

## 2014-11-04 DIAGNOSIS — F39 Unspecified mood [affective] disorder: Secondary | ICD-10-CM | POA: Diagnosis not present

## 2014-11-04 LAB — POCT URINALYSIS DIPSTICK
Bilirubin, UA: NEGATIVE
Blood, UA: NEGATIVE
Glucose, UA: NEGATIVE
Ketones, UA: NEGATIVE
Leukocytes, UA: NEGATIVE
NITRITE UA: NEGATIVE
PROTEIN UA: NEGATIVE
Spec Grav, UA: 1.02
UROBILINOGEN UA: NEGATIVE
pH, UA: 6.5

## 2014-11-05 DIAGNOSIS — Z96642 Presence of left artificial hip joint: Secondary | ICD-10-CM | POA: Insufficient documentation

## 2014-11-05 NOTE — Patient Instructions (Signed)
Was a pleasure to see you today. Continue same medications and return in one year.

## 2014-11-05 NOTE — Progress Notes (Signed)
Subjective:    Patient ID: Luis Cisneros, male    DOB: 12-21-1940, 74 y.o.   MRN: [id number]  HPI 74 year old White Male in today for health maintenance exam and evaluation of medical issues. In the March 2016, he had  hip replacement by Dr. Mayer Camel. He's done well. He is back to working full-time. Takes meloxicam for musculoskeletal pain and tramadol.  Declines flu vaccine. He has history of hyperlipidemia, hypertension, seasonal affective disorder, erectile dysfunction. He sees Dr. Hector Shade for knee pain and musculoskeletal pain.  Past medical history: Degenerative joint disease. Cervical radiculopathy 2001. History of bilateral pulmonary emboli status post surgery in 2006 for leg fracture. Takes baby aspirin daily. History of calcified granuloma right middle lobe . Quit smoking over 20 years ago. Had a CT of the chest October 2011 showing noncalcified nodule previously identified within the right middle lobe which was completely stable and considered benign. Also felt to have scarring in the posterior left lower lobe.  Old records indicate he has history of impaired glucose tolerance and vitamin deficiency. However hemoglobin A1c in 2011 was 5.7% which is considered to be upper limits of normal. At one point he became anemic because of donating blood every 6 weeks in 2010. He now donates blood about 3 times yearly.  He is married to [employer]. He stays physically active remodeling homes. Social alcohol consumption. Quit smoking over 20 years ago. They have no children.  Family history: Father died at age 29 due to lung infection and MI. Mother died at age 12 with apparent aortic aneurysm rupture. 2 sisters in good health. Patient has been screened for abdominal aortic aneurysm.    Review of Systems  HENT: Negative.   Respiratory: Negative.   Cardiovascular: Negative.   Gastrointestinal: Negative.   Musculoskeletal: Positive for arthralgias.  Psychiatric/Behavioral:       History  of seasonal affect disorder       Objective:   Physical Exam  Constitutional: He is oriented to person, place, and time. He appears well-developed and well-nourished. No distress.  HENT:  Head: Normocephalic and atraumatic.  Right Ear: External ear normal.  Left Ear: External ear normal.  Mouth/Throat: Oropharynx is clear and moist. No oropharyngeal exudate.  Eyes: Conjunctivae and EOM are normal. Pupils are equal, round, and reactive to light. Right eye exhibits no discharge. Left eye exhibits no discharge. No scleral icterus.  Neck: Neck supple.  Cardiovascular: Normal rate, regular rhythm, normal heart sounds and intact distal pulses.   No murmur heard. Pulmonary/Chest: Effort normal and breath sounds normal. No respiratory distress. He has no wheezes. He has no rales.  Abdominal: Soft. Bowel sounds are normal. He exhibits no distension and no mass. There is no tenderness. There is no rebound and no guarding.  Genitourinary: Prostate normal.  Musculoskeletal: Normal range of motion. He exhibits no edema.  Neurological: He is alert and oriented to person, place, and time. He has normal reflexes. No cranial nerve deficit. Coordination normal.  Skin: Skin is warm and dry. No rash noted. He is not diaphoretic.  Psychiatric: He has a normal mood and affect. His behavior is normal. Judgment and thought content normal.  Vitals reviewed.         Assessment & Plan:  Seasonal affective disorder  Status post hip replacement-left  Bilateral knee pain  Hyperlipidemia  Remote history of pulmonary emboli  Erectile dysfunction  Essential hypertension  Plan: Pleased with his lab work. Lipid panel within normal limits. Liver functions are  normal. PSA within normal limits.  Plan: Return in one year or as needed. Declines flu vaccine. Subjective:   Patient presents for Medicare Annual/Subsequent preventive examination.  Review Past Medical/Family/Social:   Risk Factors  Current  exercise habits:  Dietary issues discussed:   Cardiac risk factors:  Depression Screen  (Note: if answer to either of the following is "Yes", a more complete depression screening is indicated)   Over the past two weeks, have you felt down, depressed or hopeless? No  Over the past two weeks, have you felt little interest or pleasure in doing things? No Have you lost interest or pleasure in daily life? No Do you often feel hopeless? No Do you cry easily over simple problems? No   Activities of Daily Living  In your present state of health, do you have any difficulty performing the following activities?:   Driving? No  Managing money? No  Feeding yourself? No  Getting from bed to chair? No  Climbing a flight of stairs? No  Preparing food and eating?: No  Bathing or showering? No  Getting dressed: No  Getting to the toilet? No  Using the toilet:No  Moving around from place to place: No  In the past year have you fallen or had a near fall?:No  Are you sexually active? yes Do you have more than one partner? No   Hearing Difficulties: No  Do you often ask people to speak up or repeat themselves? yes Do you experience ringing or noises in your ears? No  Do you have difficulty understanding soft or whispered voices? No  Do you feel that you have a problem with memory? sometimes Do you often misplace items? sometimes   Home Safety:  Do you have a smoke alarm at your residence? Yes Do you have grab bars in the bathroom?yes Do you have throw rugs in your house?yes   Cognitive Testing  Alert? Yes Normal Appearance?Yes  Oriented to person? Yes Place? Yes  Time? Yes  Recall of three objects? Yes  Can perform simple calculations? Yes  Displays appropriate judgment?Yes  Can read the correct time from a watch face?Yes   List the Names of Other Physician/Practitioners you currently use:  See referral list for the physicians patient is currently seeing.  Dr. Oneida Alar   Review of  Systems: See above   Objective:     General appearance: Appears stated age  Head: Normocephalic, without obvious abnormality, atraumatic  Eyes: conj clear, EOMi PEERLA  Ears: normal TM's and external ear canals both ears  Nose: Nares normal. Septum midline. Mucosa normal. No drainage or sinus tenderness.  Throat: lips, mucosa, and tongue normal; teeth and gums normal  Neck: no adenopathy, no carotid bruit, no JVD, supple, symmetrical, trachea midline and thyroid not enlarged, symmetric, no tenderness/mass/nodules  No CVA tenderness.  Lungs: clear to auscultation bilaterally  Breasts: normal appearance, no masses or tenderness Heart: regular rate and rhythm, S1, S2 normal, no murmur, click, rub or gallop  Abdomen: soft, non-tender; bowel sounds normal; no masses, no organomegaly  Musculoskeletal: ROM normal in all joints, no crepitus, no deformity, Normal muscle strengthen. Back  is symmetric, no curvature. Skin: Skin color, texture, turgor normal. No rashes or lesions  Lymph nodes: Cervical, supraclavicular, and axillary nodes normal.  Neurologic: CN 2 -12 Normal, Normal symmetric reflexes. Normal coordination and gait  Psych: Alert & Oriented x 3, Mood appear stable.    Assessment:    Annual wellness medicare exam   Plan:  During the course of the visit the patient was educated and counseled about appropriate screening and preventive services including:   Annual PSA     Patient Instructions (the written plan) was given to the patient.  Medicare Attestation  I have personally reviewed:  The patient's medical and social history  Their use of alcohol, tobacco or illicit drugs  Their current medications and supplements  The patient's functional ability including ADLs,fall risks, home safety risks, cognitive, and hearing and visual impairment  Diet and physical activities  Evidence for depression or mood disorders  The patient's weight, height, BMI, and visual acuity have  been recorded in the chart. I have made referrals, counseling, and provided education to the patient based on review of the above and I have provided the patient with a written personalized care plan for preventive services.

## 2014-12-07 ENCOUNTER — Other Ambulatory Visit: Payer: Self-pay | Admitting: Internal Medicine

## 2014-12-18 ENCOUNTER — Other Ambulatory Visit: Payer: Self-pay | Admitting: Internal Medicine

## 2015-02-03 DIAGNOSIS — H524 Presbyopia: Secondary | ICD-10-CM | POA: Diagnosis not present

## 2015-02-03 DIAGNOSIS — H5203 Hypermetropia, bilateral: Secondary | ICD-10-CM | POA: Diagnosis not present

## 2015-02-03 DIAGNOSIS — H52223 Regular astigmatism, bilateral: Secondary | ICD-10-CM | POA: Diagnosis not present

## 2015-03-03 DIAGNOSIS — D2261 Melanocytic nevi of right upper limb, including shoulder: Secondary | ICD-10-CM | POA: Diagnosis not present

## 2015-03-03 DIAGNOSIS — L820 Inflamed seborrheic keratosis: Secondary | ICD-10-CM | POA: Diagnosis not present

## 2015-03-03 DIAGNOSIS — L821 Other seborrheic keratosis: Secondary | ICD-10-CM | POA: Diagnosis not present

## 2015-03-03 DIAGNOSIS — Z85828 Personal history of other malignant neoplasm of skin: Secondary | ICD-10-CM | POA: Diagnosis not present

## 2015-03-03 DIAGNOSIS — L570 Actinic keratosis: Secondary | ICD-10-CM | POA: Diagnosis not present

## 2015-03-18 ENCOUNTER — Other Ambulatory Visit: Payer: Self-pay | Admitting: Internal Medicine

## 2015-07-11 DIAGNOSIS — L821 Other seborrheic keratosis: Secondary | ICD-10-CM | POA: Diagnosis not present

## 2015-07-11 DIAGNOSIS — D1801 Hemangioma of skin and subcutaneous tissue: Secondary | ICD-10-CM | POA: Diagnosis not present

## 2015-07-11 DIAGNOSIS — Z85828 Personal history of other malignant neoplasm of skin: Secondary | ICD-10-CM | POA: Diagnosis not present

## 2015-07-11 DIAGNOSIS — C44529 Squamous cell carcinoma of skin of other part of trunk: Secondary | ICD-10-CM | POA: Diagnosis not present

## 2015-07-11 DIAGNOSIS — L570 Actinic keratosis: Secondary | ICD-10-CM | POA: Diagnosis not present

## 2015-08-15 ENCOUNTER — Other Ambulatory Visit: Payer: Self-pay | Admitting: *Deleted

## 2015-08-15 MED ORDER — MELOXICAM 15 MG PO TABS: 15.0000 mg | ORAL_TABLET | Freq: Every day | ORAL | Status: DC

## 2015-09-16 ENCOUNTER — Other Ambulatory Visit: Payer: Self-pay | Admitting: Internal Medicine

## 2015-09-16 NOTE — Telephone Encounter (Signed)
Patient has CPE Labs scheduled for 9/28.  CPE scheduled for 10/2 at 10:00 a.m.  Called [facility] @ [address] @ [phone] and left voice mail to refill Simvastatin 40mg , #90, no refill.

## 2015-09-16 NOTE — Telephone Encounter (Signed)
Due for CPE after Sept 29  please call

## 2015-11-02 ENCOUNTER — Other Ambulatory Visit: Payer: Self-pay | Admitting: *Deleted

## 2015-11-02 DIAGNOSIS — Z125 Encounter for screening for malignant neoplasm of prostate: Secondary | ICD-10-CM

## 2015-11-02 DIAGNOSIS — E785 Hyperlipidemia, unspecified: Secondary | ICD-10-CM

## 2015-11-02 DIAGNOSIS — I10 Essential (primary) hypertension: Secondary | ICD-10-CM

## 2015-11-03 ENCOUNTER — Other Ambulatory Visit: Payer: Medicare Other | Admitting: Internal Medicine

## 2015-11-03 DIAGNOSIS — I10 Essential (primary) hypertension: Secondary | ICD-10-CM | POA: Diagnosis not present

## 2015-11-03 DIAGNOSIS — E785 Hyperlipidemia, unspecified: Secondary | ICD-10-CM | POA: Diagnosis not present

## 2015-11-03 DIAGNOSIS — Z125 Encounter for screening for malignant neoplasm of prostate: Secondary | ICD-10-CM | POA: Diagnosis not present

## 2015-11-03 DIAGNOSIS — Z0000 Encounter for general adult medical examination without abnormal findings: Secondary | ICD-10-CM

## 2015-11-03 LAB — COMPREHENSIVE METABOLIC PANEL
ALBUMIN: 3.9 g/dL (ref 3.6–5.1)
ALK PHOS: 61 U/L (ref 40–115)
ALT: 20 U/L (ref 9–46)
AST: 23 U/L (ref 10–35)
BUN: 25 mg/dL (ref 7–25)
CO2: 26 mmol/L (ref 20–31)
CREATININE: 0.76 mg/dL (ref 0.70–1.18)
Calcium: 8.9 mg/dL (ref 8.6–10.3)
Chloride: 105 mmol/L (ref 98–110)
Glucose, Bld: 92 mg/dL (ref 65–99)
POTASSIUM: 4.5 mmol/L (ref 3.5–5.3)
Sodium: 140 mmol/L (ref 135–146)
TOTAL PROTEIN: 6.8 g/dL (ref 6.1–8.1)
Total Bilirubin: 0.7 mg/dL (ref 0.2–1.2)

## 2015-11-03 LAB — CBC WITH DIFFERENTIAL/PLATELET
BASOS PCT: 1 %
Basophils Absolute: 50 cells/uL (ref 0–200)
EOS PCT: 3 %
Eosinophils Absolute: 150 cells/uL (ref 15–500)
HCT: 39.5 % (ref 38.5–50.0)
Hemoglobin: 13.2 g/dL (ref 13.2–17.1)
LYMPHS ABS: 1700 {cells}/uL (ref 850–3900)
LYMPHS PCT: 34 %
MCH: 29.1 pg (ref 27.0–33.0)
MCHC: 33.4 g/dL (ref 32.0–36.0)
MCV: 87 fL (ref 80.0–100.0)
MPV: 9 fL (ref 7.5–12.5)
Monocytes Absolute: 550 cells/uL (ref 200–950)
Monocytes Relative: 11 %
NEUTROS PCT: 51 %
Neutro Abs: 2550 cells/uL (ref 1500–7800)
Platelets: 277 10*3/uL (ref 140–400)
RBC: 4.54 MIL/uL (ref 4.20–5.80)
RDW: 14.4 % (ref 11.0–15.0)
WBC: 5 10*3/uL (ref 3.8–10.8)

## 2015-11-03 LAB — LIPID PANEL
Cholesterol: 183 mg/dL (ref 125–200)
HDL: 70 mg/dL (ref >=40)
LDL Cholesterol: 99 mg/dL (ref <130)
TRIGLYCERIDES: 70 mg/dL (ref <150)
Total CHOL/HDL Ratio: 2.6 Ratio (ref <=5.0)
VLDL: 14 mg/dL (ref <30)

## 2015-11-03 LAB — PSA: PSA: 0.7 ng/mL (ref <=4.0)

## 2015-11-07 ENCOUNTER — Encounter: Payer: Self-pay | Admitting: Internal Medicine

## 2015-11-07 ENCOUNTER — Ambulatory Visit (INDEPENDENT_AMBULATORY_CARE_PROVIDER_SITE_OTHER): Payer: Medicare Other | Admitting: Internal Medicine

## 2015-11-07 VITALS — BP 128/90 | HR 96 | Temp 98.2°F | Ht 68.5 in | Wt 185.5 lb

## 2015-11-07 DIAGNOSIS — Z96642 Presence of left artificial hip joint: Secondary | ICD-10-CM

## 2015-11-07 DIAGNOSIS — M791 Myalgia: Secondary | ICD-10-CM

## 2015-11-07 DIAGNOSIS — Z0000 Encounter for general adult medical examination without abnormal findings: Secondary | ICD-10-CM

## 2015-11-07 DIAGNOSIS — I499 Cardiac arrhythmia, unspecified: Secondary | ICD-10-CM

## 2015-11-07 DIAGNOSIS — M7918 Myalgia, other site: Secondary | ICD-10-CM

## 2015-11-07 DIAGNOSIS — E785 Hyperlipidemia, unspecified: Secondary | ICD-10-CM

## 2015-11-07 DIAGNOSIS — Z86711 Personal history of pulmonary embolism: Secondary | ICD-10-CM

## 2015-11-07 DIAGNOSIS — F338 Other recurrent depressive disorders: Secondary | ICD-10-CM

## 2015-11-07 DIAGNOSIS — I491 Atrial premature depolarization: Secondary | ICD-10-CM | POA: Diagnosis not present

## 2015-11-07 DIAGNOSIS — N529 Male erectile dysfunction, unspecified: Secondary | ICD-10-CM

## 2015-11-07 DIAGNOSIS — I10 Essential (primary) hypertension: Secondary | ICD-10-CM

## 2015-11-07 DIAGNOSIS — F339 Major depressive disorder, recurrent, unspecified: Secondary | ICD-10-CM

## 2015-11-07 LAB — POCT URINALYSIS DIPSTICK
BILIRUBIN UA: NEGATIVE
GLUCOSE UA: NEGATIVE
Ketones, UA: NEGATIVE
Leukocytes, UA: NEGATIVE
NITRITE UA: NEGATIVE
PH UA: 6
Protein, UA: NEGATIVE
RBC UA: NEGATIVE
SPEC GRAV UA: 1.015
Urobilinogen, UA: NEGATIVE

## 2015-11-07 NOTE — Progress Notes (Signed)
Subjective:    Patient ID: Luis Cisneros, male    DOB: 03-22-1940, 75 y.o.   MRN: [id number]  HPI 75 year old Male in today for health maintenance exam and Medicare wellness exam. No new complaints or issues except for hearing loss. Thinking of having hearing aids purchased through [place].   In March 2016 he had hip replacement by Dr. Malva Cogan. He's done well with that.  Has history of hyperlipidemia, hypertension, seasonal affective disorder, erectile dysfunction.  Past medical history: Degenerative joint disease. Cervical radiculopathy 2001. History of bilateral pulmonary emboli status post surgery in 2006 for leg fracture. Takes baby aspirin daily. History of calcified granuloma right middle lobe. Quit smoking over 20 years ago. Had a CT of chest October 2011 showing noncalcified nodule previously identified within the right middle lobe which was completely stable and considered benign. Felt to have scarring in the posterior left lower lobe.  All records indicate he has a history of impaired glucose tolerance and vitamin D deficiency.  Hemoglobin A1c in 2011 was 5.7%.  At one point he became anemic because of donating blood every 6 weeks in 2010. He now limits his blood donations.  He stays physically active remodeling homes. He is married to [employer]. Social alcohol consumption. Quit smoking over 20 years ago. They have no children.  Family history: Father died at age 6 due to lung infection and MI. Mother died at age 48 with apparent aortic aneurysm rupture. 2 sisters in good health.  Patient has been screening for abdominal aortic aneurysm in the past.        Review of Systems  Constitutional: Negative.   All other systems reviewed and are negative.      Objective:   Physical Exam  Constitutional: He appears well-developed and well-nourished. No distress.  HENT:  Head: Normocephalic and atraumatic.  Right Ear: External ear normal.  Left Ear: External ear  normal.  Mouth/Throat: Oropharynx is clear and moist. No oropharyngeal exudate.  Eyes: Conjunctivae and EOM are normal. Pupils are equal, round, and reactive to light. Right eye exhibits no discharge. Left eye exhibits no discharge. No scleral icterus.  Neck: Neck supple. No JVD present. No thyromegaly present.  Cardiovascular: Normal rate, normal heart sounds and intact distal pulses.   No murmur heard. Irregular rhythm. EKG shows PACs  Pulmonary/Chest: Effort normal and breath sounds normal. No respiratory distress. He has no wheezes. He has no rales.  Abdominal: Bowel sounds are normal. He exhibits no distension and no mass. There is no tenderness. There is no rebound and no guarding.  Genitourinary: Prostate normal.  Musculoskeletal: He exhibits no edema.  Lymphadenopathy:    He has no cervical adenopathy.  Neurological: He is alert. He has normal reflexes. No cranial nerve deficit. Coordination normal.  Skin: Skin is warm and dry. No rash noted. He is not diaphoretic.  Psychiatric: He has a normal mood and affect. His behavior is normal. Judgment and thought content normal.  Vitals reviewed.         Assessment & Plan:  Status post left hip replacement  Premature atrial contractions  Hyperlipidemia  Essential hypertension  Seasonal affective disorder  History of bilateral knee pain  Remote history of pulmonary emboli treated with baby aspirin  Erectile dysfunction  Plan: Continue same medications and return in one year or as needed. Labs reviewed and are completely within normal limits including PSA.  Subjective:   Patient presents for Medicare Annual/Subsequent preventive examination.  Review Past Medical/Family/Social: See above  Risk Factors  Current exercise habits: Very physically active Dietary issues discussed: Low fat low carbohydrate  Cardiac risk factors: Family history, hyperlipidemia  Depression Screen  (Note: if answer to either of the following  is "Yes", a more complete depression screening is indicated)   Over the past two weeks, have you felt down, depressed or hopeless? No  Over the past two weeks, have you felt little interest or pleasure in doing things? No Have you lost interest or pleasure in daily life? No Do you often feel hopeless? No Do you cry easily over simple problems? No   Activities of Daily Living  In your present state of health, do you have any difficulty performing the following activities?:   Driving? No  Managing money? No  Feeding yourself? No  Getting from bed to chair? No  Climbing a flight of stairs? No  Preparing food and eating?: No  Bathing or showering? No  Getting dressed: No  Getting to the toilet? No  Using the toilet:No  Moving around from place to place: No  In the past year have you fallen or had a near fall?:No  Are you sexually active? No  Do you have more than one partner? No   Hearing Difficulties: No  Do you often ask people to speak up or repeat themselves? Yes Do you experience ringing or noises in your ears? No  Do you have difficulty understanding soft or whispered voices? Yes-looking into hearing aids Do you feel that you have a problem with memory? Occasionally Do you often misplace items? Occasionally   Home Safety:  Do you have a smoke alarm at your residence? Yes Do you have grab bars in the bathroom? No Do you have throw rugs in your house? No   Cognitive Testing  Alert? Yes Normal Appearance?Yes  Oriented to person? Yes Place? Yes  Time? Yes  Recall of three objects? Yes  Can perform simple calculations? Yes  Displays appropriate judgment?Yes  Can read the correct time from a watch face?Yes   List the Names of Other Physician/Practitioners you currently use:  See referral list for the physicians patient is currently seeing.     Review of Systems: See above   Objective:     General appearance: Appears stated age and mildly obese  Head:  Normocephalic, without obvious abnormality, atraumatic  Eyes: conj clear, EOMi PEERLA  Ears: normal TM's and external ear canals both ears  Nose: Nares normal. Septum midline. Mucosa normal. No drainage or sinus tenderness.  Throat: lips, mucosa, and tongue normal; teeth and gums normal  Neck: no adenopathy, no carotid bruit, no JVD, supple, symmetrical, trachea midline and thyroid not enlarged, symmetric, no tenderness/mass/nodules  No CVA tenderness.  Lungs: clear to auscultation bilaterally  Breasts: normal appearance, no masses or tenderness, top of the pacemaker on left upper chest. Incision well-healed. It is tender.  Heart: Irregular rhythm S1, S2 normal, no murmur, click, rub or gallop  Abdomen: soft, non-tender; bowel sounds normal; no masses, no organomegaly  Musculoskeletal: ROM normal in all joints, no crepitus, no deformity, Normal muscle strengthen. Back  is symmetric, no curvature. Skin: Skin color, texture, turgor normal. No rashes or lesions  Lymph nodes: Cervical, supraclavicular, and axillary nodes normal.  Neurologic: CN 2 -12 Normal, Normal symmetric reflexes. Normal coordination and gait  Psych: Alert & Oriented x 3, Mood appear stable.    Assessment:    Annual wellness medicare exam   Plan:    During the course of the  visit the patient was educated and counseled about appropriate screening and preventive services including:   Declines flu vaccine  Reminded about colonoscopy     Patient Instructions (the written plan) was given to the patient.  Medicare Attestation  I have personally reviewed:  The patient's medical and social history  Their use of alcohol, tobacco or illicit drugs  Their current medications and supplements  The patient's functional ability including ADLs,fall risks, home safety risks, cognitive, and hearing and visual impairment  Diet and physical activities  Evidence for depression or mood disorders  The patient's weight, height, BMI,  and visual acuity have been recorded in the chart. I have made referrals, counseling, and provided education to the patient based on review of the above and I have provided the patient with a written personalized care plan for preventive services.

## 2015-12-03 NOTE — Patient Instructions (Signed)
EKG shows benign PACs. Continue same medications and return in one year or as needed.

## 2015-12-16 ENCOUNTER — Other Ambulatory Visit: Payer: Self-pay | Admitting: Internal Medicine

## 2016-01-11 ENCOUNTER — Ambulatory Visit (INDEPENDENT_AMBULATORY_CARE_PROVIDER_SITE_OTHER): Payer: Medicare Other | Admitting: Sports Medicine

## 2016-01-11 ENCOUNTER — Encounter: Payer: Self-pay | Admitting: Sports Medicine

## 2016-01-11 DIAGNOSIS — M25512 Pain in left shoulder: Secondary | ICD-10-CM

## 2016-01-11 DIAGNOSIS — G8929 Other chronic pain: Secondary | ICD-10-CM | POA: Diagnosis not present

## 2016-01-11 DIAGNOSIS — Z96642 Presence of left artificial hip joint: Secondary | ICD-10-CM | POA: Diagnosis not present

## 2016-01-11 MED ORDER — TRAMADOL HCL 50 MG PO TABS: 50.0000 mg | ORAL_TABLET | Freq: Four times a day (QID) | ORAL | 1 refills | Status: DC | PRN

## 2016-01-11 NOTE — Assessment & Plan Note (Addendum)
Inflammation of Bicipital tendon noted on [place], ; impingement from bone spur. That is subacromial and some calcification  AC joint Arthritis changes also noted.  Full strength and ROM on physical exam, so less consistent with rotator cuff injury, and no rotator cuff abnormalities noted on [place].  - Begin tramadol qhs Good ROM exercises - F/u PRN

## 2016-01-11 NOTE — Progress Notes (Signed)
   Subjective:    Patient ID: Luis Cisneros, male    DOB: December 19, 1940, 75 y.o.   MRN: [id number]  HPI  Patient presents with L shoulder pain.   L Shoulder Pain Patient reports pain in L shoulder primarily at night. Pain does not awaken him from sleep, but makes it difficult for patient to fall asleep. He is able to fall asleep in certain positions. The pain does not bother him when he is awake or active during the day. Describes pain as dull and aching, and rates pain 2-3/10. Denies numbness, weakness, or tingling. Has taken Tramadol once which significantly improved his pain.  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 He has replacing and reframing windows recently, and thus lifting above his head.   Patient is former smoker.   Review of Systems See HPI.  No radiating sxs form neck No numbness in arm    Objective:   Physical Exam  Constitutional: He is oriented to person, place, and time.  Very pleasant elderly male in NAD  HENT:  Head: Normocephalic and atraumatic.  Pulmonary/Chest: Effort normal. No respiratory distress.  Musculoskeletal:  Shoulders symmetrical with no gross bony abnormalities noted on inspection or palpation. No TTP to L shoulder. Full ROM. Neg empty can. Neg Hawkin's. 5/5 strength upper extremities bilaterally.   Neurological: He is alert and oriented to person, place, and time.  Skin: Skin is warm and dry.  Psychiatric: He has a normal mood and affect. His behavior is normal.     [place]: Left shoulder  The rotator cuff tendons are intact There is mild irregularity of humeral head Spurring subacromial impinges on BT BT shows a calcification and some mild increase hypoechoic fluid AC joint spurring and calcification w mild effusion  Assessment & Plan:  Left shoulder pain Inflammation of surrounding structures noted on [place], possibly suggestive of bone spur. Arthritis changes also noted. Full strength and ROM on physical  exam, so less consistent with rotator cuff injury, and no rotator cuff abnormalities noted on [place].  - Begin tramadol qhs - F/u PRN  Adin Hector, MD, MPH PGY-2 [facility]  I observed and examined the patient with the resident and agree with assessment and plan.  Note reviewed and modified by me. Stefanie Libel, MD

## 2016-01-11 NOTE — Assessment & Plan Note (Signed)
This is doing well.  Lateral approach by Dr Mayer Camel

## 2016-02-08 ENCOUNTER — Ambulatory Visit: Payer: Medicare Other | Admitting: Sports Medicine

## 2016-02-13 ENCOUNTER — Other Ambulatory Visit: Payer: Self-pay | Admitting: Internal Medicine

## 2016-03-01 DIAGNOSIS — H524 Presbyopia: Secondary | ICD-10-CM | POA: Diagnosis not present

## 2016-03-01 DIAGNOSIS — H2513 Age-related nuclear cataract, bilateral: Secondary | ICD-10-CM | POA: Diagnosis not present

## 2016-03-01 DIAGNOSIS — H5203 Hypermetropia, bilateral: Secondary | ICD-10-CM | POA: Diagnosis not present

## 2016-03-01 DIAGNOSIS — H52223 Regular astigmatism, bilateral: Secondary | ICD-10-CM | POA: Diagnosis not present

## 2016-03-09 DIAGNOSIS — D1801 Hemangioma of skin and subcutaneous tissue: Secondary | ICD-10-CM | POA: Diagnosis not present

## 2016-03-09 DIAGNOSIS — L821 Other seborrheic keratosis: Secondary | ICD-10-CM | POA: Diagnosis not present

## 2016-03-09 DIAGNOSIS — Z85828 Personal history of other malignant neoplasm of skin: Secondary | ICD-10-CM | POA: Diagnosis not present

## 2016-03-09 DIAGNOSIS — L570 Actinic keratosis: Secondary | ICD-10-CM | POA: Diagnosis not present

## 2016-03-09 DIAGNOSIS — L820 Inflamed seborrheic keratosis: Secondary | ICD-10-CM | POA: Diagnosis not present

## 2016-03-15 ENCOUNTER — Other Ambulatory Visit: Payer: Self-pay | Admitting: *Deleted

## 2016-03-15 MED ORDER — MELOXICAM 15 MG PO TABS: 15.0000 mg | ORAL_TABLET | Freq: Every day | ORAL | 6 refills | Status: DC

## 2016-04-03 ENCOUNTER — Other Ambulatory Visit: Payer: Self-pay | Admitting: Internal Medicine

## 2016-05-24 ENCOUNTER — Ambulatory Visit (INDEPENDENT_AMBULATORY_CARE_PROVIDER_SITE_OTHER): Payer: Medicare Other | Admitting: Sports Medicine

## 2016-05-24 DIAGNOSIS — M7040 Prepatellar bursitis, unspecified knee: Secondary | ICD-10-CM | POA: Insufficient documentation

## 2016-05-24 DIAGNOSIS — M7041 Prepatellar bursitis, right knee: Secondary | ICD-10-CM

## 2016-05-24 MED ORDER — METHYLPREDNISOLONE ACETATE 40 MG/ML IJ SUSP
20.0000 mg | Freq: Once | INTRAMUSCULAR | Status: AC
  Administered 2016-05-24: 20 mg via INTRA_ARTICULAR

## 2016-05-24 NOTE — Progress Notes (Signed)
  Luis Cisneros - 76 y.o. male MRN [id number]  Date of birth: 1940/09/03  SUBJECTIVE:  Including CC & ROS.  CC: right knee swelling  Presents with right knee swelling that is been ongoing for the past 6 months. 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 He reports that when he is kneeling his right knee hurts and he has noticed swelling since December. It is located right over the kneecap he states. He denies any other pain when he is not physically kneeling on his knee. Denies any specific injury. He has tried a knee pad but the Velcro straps cut into the skin posteriorly.    ROS: No unexpected weight loss, fever, chills, swelling, instability, muscle pain, numbness/tingling, redness, otherwise see HPI   PMHx - Updated and reviewed.  Contributory factors include: Negative PSHx - Updated and reviewed.  Contributory factors include:  Negative FHx - Updated and reviewed.  Contributory factors include:  Negative Social Hx - Updated and reviewed. Contributory factors include: Negative Medications - reviewed   DATA REVIEWED: Previous office visits  PHYSICAL EXAM:  VS: BP:(!) 146/73  HR: bpm  TEMP: ( )  RESP:   HT:5\' 9"  (175.3 cm)   WT:190 lb (86.2 kg)  BMI:28.1 PHYSICAL EXAM: Gen: NAD, alert, cooperative with exam, well-appearing HEENT: clear conjunctiva,  CV:  no edema, capillary refill brisk, normal rate Resp: non-labored Skin: no rashes, normal turgor  Neuro: no gross deficits.  Psych:  alert and oriented  Knee: Inspection shows swelling of the prepatellar bursa with no fluid wave. Palpation with no warmth, joint line tenderness, patellar tenderness, or condyle tenderness.  mild tenderness palpation over the prepatellar bursal swelling  ROM full in flexion and extension and lower leg rotation. Patellar and quadriceps tendons unremarkable. Hamstring and quadriceps strength is normal.     ASSESSMENT & PLAN:   Prepatellar  bursitis Is bothering him significantly with his job. For this reason he was injected with 20 mg Depo-Medrol and a half cc of lidocaine. He tolerated the procedure well. He was also instructed to get out of volleyball knee pad to try for when he is working. This should not cut into his knee like the Velcro straps do.  Follow-up as needed.  Procedure:  Injection of right prepatellar bursa Consent obtained and verified. Time-out conducted. Noted no overlying erythema, induration, or other signs of local infection. Skin prepped in a sterile fashion. Topical analgesic spray: Ethyl chloride. Completed without difficulty. Meds: 20 mg Depo-Medrol and half cc of lidocaine 1% Pain immediately improved suggesting accurate placement of the medication. Advised to call if fevers/chills, erythema, induration, drainage, or persistent bleeding.

## 2016-05-24 NOTE — Assessment & Plan Note (Signed)
Is bothering him significantly with his job. For this reason he was injected with 20 mg Depo-Medrol and a half cc of lidocaine. He tolerated the procedure well. He was also instructed to get out of volleyball knee pad to try for when he is working. This should not cut into his knee like the Velcro straps do.  Follow-up as needed.

## 2016-06-14 ENCOUNTER — Ambulatory Visit: Payer: Medicare Other | Admitting: Sports Medicine

## 2016-06-26 ENCOUNTER — Other Ambulatory Visit: Payer: Self-pay | Admitting: *Deleted

## 2016-06-26 MED ORDER — TRAMADOL HCL 50 MG PO TABS: 50.0000 mg | ORAL_TABLET | Freq: Four times a day (QID) | ORAL | 1 refills | Status: DC | PRN

## 2016-10-11 ENCOUNTER — Other Ambulatory Visit: Payer: Self-pay

## 2016-10-11 MED ORDER — MELOXICAM 15 MG PO TABS: 15.0000 mg | ORAL_TABLET | Freq: Every day | ORAL | 6 refills | Status: DC

## 2016-11-05 ENCOUNTER — Other Ambulatory Visit: Payer: Medicare Other | Admitting: Internal Medicine

## 2016-11-05 DIAGNOSIS — I10 Essential (primary) hypertension: Secondary | ICD-10-CM

## 2016-11-05 DIAGNOSIS — E785 Hyperlipidemia, unspecified: Secondary | ICD-10-CM | POA: Diagnosis not present

## 2016-11-05 DIAGNOSIS — Z0000 Encounter for general adult medical examination without abnormal findings: Secondary | ICD-10-CM | POA: Diagnosis not present

## 2016-11-05 DIAGNOSIS — Z125 Encounter for screening for malignant neoplasm of prostate: Secondary | ICD-10-CM | POA: Diagnosis not present

## 2016-11-06 LAB — CBC WITH DIFFERENTIAL/PLATELET
BASOS ABS: 40 {cells}/uL (ref 0–200)
Basophils Relative: 0.7 %
Eosinophils Absolute: 194 cells/uL (ref 15–500)
Eosinophils Relative: 3.4 %
HEMATOCRIT: 41.6 % (ref 38.5–50.0)
Hemoglobin: 13.8 g/dL (ref 13.2–17.1)
LYMPHS ABS: 1379 {cells}/uL (ref 850–3900)
MCH: 29.2 pg (ref 27.0–33.0)
MCHC: 33.2 g/dL (ref 32.0–36.0)
MCV: 88.1 fL (ref 80.0–100.0)
MPV: 9 fL (ref 7.5–12.5)
Monocytes Relative: 12.9 %
NEUTROS PCT: 58.8 %
Neutro Abs: 3352 cells/uL (ref 1500–7800)
Platelets: 277 10*3/uL (ref 140–400)
RBC: 4.72 10*6/uL (ref 4.20–5.80)
RDW: 14.4 % (ref 11.0–15.0)
Total Lymphocyte: 24.2 %
WBC: 5.7 10*3/uL (ref 3.8–10.8)
WBCMIX: 735 {cells}/uL (ref 200–950)

## 2016-11-06 LAB — COMPLETE METABOLIC PANEL WITH GFR
AG RATIO: 1.4 (calc) (ref 1.0–2.5)
ALKALINE PHOSPHATASE (APISO): 72 U/L (ref 40–115)
ALT: 22 U/L (ref 9–46)
AST: 22 U/L (ref 10–35)
Albumin: 4 g/dL (ref 3.6–5.1)
BUN: 21 mg/dL (ref 7–25)
CO2: 27 mmol/L (ref 20–32)
Calcium: 8.9 mg/dL (ref 8.6–10.3)
Chloride: 103 mmol/L (ref 98–110)
Creat: 0.82 mg/dL (ref 0.70–1.18)
GFR, EST NON AFRICAN AMERICAN: 86 mL/min/{1.73_m2} (ref >=60)
GFR, Est African American: 100 mL/min/{1.73_m2} (ref >=60)
GLOBULIN: 2.9 g/dL (ref 1.9–3.7)
Glucose, Bld: 105 mg/dL — ABNORMAL HIGH (ref 65–99)
POTASSIUM: 4.4 mmol/L (ref 3.5–5.3)
SODIUM: 138 mmol/L (ref 135–146)
Total Bilirubin: 0.5 mg/dL (ref 0.2–1.2)
Total Protein: 6.9 g/dL (ref 6.1–8.1)

## 2016-11-06 LAB — LIPID PANEL
Cholesterol: 187 mg/dL (ref <200)
HDL: 69 mg/dL (ref >40)
LDL Cholesterol (Calc): 99 mg/dL (calc)
Non-HDL Cholesterol (Calc): 118 mg/dL (calc) (ref <130)
TRIGLYCERIDES: 91 mg/dL (ref <150)
Total CHOL/HDL Ratio: 2.7 (calc) (ref <5.0)

## 2016-11-06 LAB — PSA: PSA: 0.9 ng/mL (ref <=4.0)

## 2016-11-08 ENCOUNTER — Ambulatory Visit (INDEPENDENT_AMBULATORY_CARE_PROVIDER_SITE_OTHER): Payer: Medicare Other | Admitting: Internal Medicine

## 2016-11-08 VITALS — BP 122/60 | HR 75 | Temp 98.2°F | Ht 68.0 in | Wt 194.0 lb

## 2016-11-08 DIAGNOSIS — N529 Male erectile dysfunction, unspecified: Secondary | ICD-10-CM | POA: Diagnosis not present

## 2016-11-08 DIAGNOSIS — Z23 Encounter for immunization: Secondary | ICD-10-CM | POA: Diagnosis not present

## 2016-11-08 DIAGNOSIS — E785 Hyperlipidemia, unspecified: Secondary | ICD-10-CM

## 2016-11-08 DIAGNOSIS — Z0000 Encounter for general adult medical examination without abnormal findings: Secondary | ICD-10-CM | POA: Diagnosis not present

## 2016-11-08 DIAGNOSIS — I10 Essential (primary) hypertension: Secondary | ICD-10-CM

## 2016-11-08 DIAGNOSIS — I491 Atrial premature depolarization: Secondary | ICD-10-CM | POA: Diagnosis not present

## 2016-11-08 DIAGNOSIS — Z96642 Presence of left artificial hip joint: Secondary | ICD-10-CM

## 2016-11-08 DIAGNOSIS — M7918 Myalgia, other site: Secondary | ICD-10-CM

## 2016-11-08 DIAGNOSIS — F338 Other recurrent depressive disorders: Secondary | ICD-10-CM | POA: Diagnosis not present

## 2016-11-08 DIAGNOSIS — Z86711 Personal history of pulmonary embolism: Secondary | ICD-10-CM

## 2016-11-08 LAB — POCT URINALYSIS DIPSTICK
BILIRUBIN UA: NEGATIVE
Blood, UA: NEGATIVE
Glucose, UA: NEGATIVE
KETONES UA: NEGATIVE
LEUKOCYTES UA: NEGATIVE
NITRITE UA: NEGATIVE
PH UA: 6.5 (ref 5.0–8.0)
PROTEIN UA: NEGATIVE
Spec Grav, UA: 1.025 (ref 1.010–1.025)
Urobilinogen, UA: 0.2 E.U./dL

## 2016-11-08 NOTE — Progress Notes (Signed)
Subjective:    Patient ID: Luis Cisneros, male    DOB: 10-Apr-1940, 76 y.o.   MRN: [id number]  HPI 76 year old Male for health maintenance exam, Medicare wellness, and evaluation of medical issues.  Abrasion right arm. Given update on Tdap. Declines flu vaccine.  He takes Wellbutrin more for dysthymia rather than depression. Questions answered in Epic. I do not feel he significantly depressed. Mood is stable on Wellbutrin.  Has a tick bite right shoulder which is been there for several weeks and his left red papule. It is no longer itchy. Has of similar area right foot dorsal last packed with a small abrasion. Have asked him to apply hydrocortisone cream to both lesions and see if they will resolve. He is asymptomatic from the tick bite. I do not think he has Lyme disease or RMSF symptoms.  He is hard of hearing. He left his hearing aid at home today.  Lab work reviewed with him is entirely within normal limits except for nonfasting glucose of 105. He's gained about 9 pounds since last visit a year ago. At that time he was on Weight Watchers. Am asking to get back on that same diet. 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  He has a history of hypertension, hyperlipidemia, seasonal affective disorder and erectile dysfunction.  In March 2016 he had hip arthroplasty by Dr. Mayer Camel.  He has done well with that.  Past medical history: Degenerative joint disease, cervical radiculopathy 2001, history of bilateral pulmonary emboli status post surgery in 2006 for leg fracture.  Takes baby aspirin daily.  History of calcified granuloma right middle lobe.  Quit smoking over 20 years ago.  Had a CT of the chest October 2011 showing a noncalcified nodule previously identified within the right middle lobe which was completely stable and considered benign.  Felt to have scarring in the posterior left lower lobe.  Old records indicate  he has a history of impaired glucose  tolerance and vitamin D deficiency.  Hemoglobin  A1c in 2011 was 5.7%.  At one point he became anemic because of donating blood every 6 weeks in 2010.  Now limits his blood donations.  He stays physically active remodeling homes.  Social alcohol consumption.  Quit smoking over 20 years ago.  Married to [employer].  They have no children.  Family history: Father died at age 81 due to lung infection and MI.  Mother died at age 68 with apparent aortic aneurysm rupture.  2 sisters in good health.  Patient has been screened for abdominal aortic aneurysm in the past.      Review of Systems  Constitutional: Negative.   All other systems reviewed and are negative.      Objective:   Physical Exam  Constitutional: He is oriented to person, place, and time. He appears well-developed and well-nourished. No distress.  HENT:  Head: Normocephalic and atraumatic.  Right Ear: External ear normal.  Left Ear: External ear normal.  Mouth/Throat: Oropharynx is clear and moist.  Eyes: Pupils are equal, round, and reactive to light. Conjunctivae and EOM are normal. Right eye exhibits no discharge. Left eye exhibits no discharge. No scleral icterus.  Neck: Neck supple. No JVD present. No thyromegaly present.  Cardiovascular: Normal rate, normal heart sounds and intact distal pulses.   No murmur heard. Pulmonary/Chest: Effort normal and breath sounds normal. No respiratory distress. He has no wheezes. He has no rales. He exhibits no tenderness.  Abdominal: Soft. Bowel sounds are normal. He exhibits no distension and no mass. There is no tenderness. There is no rebound and no guarding.  Genitourinary:  Genitourinary Comments: Prostate normal  Musculoskeletal: He exhibits no edema.  Lymphadenopathy:    He has no cervical adenopathy.  Neurological: He is alert and oriented to person, place, and time. He has normal reflexes. No cranial nerve deficit. Coordination normal.  Skin: Skin is warm and  dry. No rash noted. He is not diaphoretic.  Psychiatric: He has a normal mood and affect. His behavior is normal. Judgment and thought content normal.  Vitals reviewed.         Assessment & Plan:  Status post left hip arthroplasty  Premature atrial contractions-EKG confirmed that in 2017  Hyperlipidemia  Essential hypertension  Seasonal affective disorder  History of bilateral knee pain  Remote history of pulmonary emboli treated with baby aspirin  Erectile dysfunction  History of impaired glucose tolerance  Plan: Continue same medications and return in 1 year or as needed.  Subjective:   Patient presents for Medicare Annual/Subsequent preventive examination.  Review Past Medical/Family/Social: See above   Risk Factors  Current exercise habits: Gets exercise with work Dietary issues discussed: Low-fat low carbohydrate  Cardiac risk factors: Hyperlipidemia and family history  Depression Screen  (Note: if answer to either of the following is "Yes", a more complete depression screening is indicated)   Over the past two weeks, have you felt down, depressed or hopeless? No  Over the past two weeks, have you felt little interest or pleasure in doing things? No Have you lost interest or pleasure in daily life? No Do you often feel hopeless? No Do you cry easily over simple problems? No   Activities of Daily Living  In your present state of health, do you have any difficulty performing the following activities?:   Driving? No  Managing money? No  Feeding yourself? No  Getting from bed to chair? No  Climbing a flight of stairs? No  Preparing food and eating?: No  Bathing or showering? No  Getting dressed: No  Getting to the toilet? No  Using the toilet:No  Moving around from place to place: No  In the past year have you fallen or had a near fall?:No  Are you sexually active? No  Do you have more than one partner? No   Hearing Difficulties: He  wears hearing  aid Do you often ask people to speak up or repeat themselves?  Not if wearing hearing aid Do you experience ringing or noises in your ears? No  Do you have difficulty understanding soft or whispered voices?  Yes Do you feel that you have a problem with memory? No Do you often misplace items? No    Home Safety:  Do you have a smoke alarm at your residence? Yes Do you have grab bars in the bathroom?  Yes Do you have throw rugs in your house?  No   Cognitive Testing  Alert? Yes Normal Appearance?Yes  Oriented to person? Yes Place? Yes  Time? Yes  Recall of three objects? Yes  Can perform simple calculations? Yes  Displays appropriate judgment?Yes  Can read the correct time from a watch face?Yes   List the Names of Other Physician/Practitioners you currently use:  See referral list for the physicians patient is currently seeing.     Review of Systems: See above   Objective:     General appearance: Appears stated age  Head: Normocephalic, without  obvious abnormality, atraumatic  Eyes: conj clear, EOMi PEERLA  Ears: normal TM's and external ear canals both ears  Nose: Nares normal. Septum midline. Mucosa normal. No drainage or sinus tenderness.  Throat: lips, mucosa, and tongue normal; teeth and gums normal  Neck: no adenopathy, no carotid bruit, no JVD, supple, symmetrical, trachea midline and thyroid not enlarged, symmetric, no tenderness/mass/nodules  No CVA tenderness.  Lungs: clear to auscultation bilaterally  Breasts: normal appearance, no masses or tenderness Heart: regular rate and rhythm, S1, S2 normal, no murmur, click, rub or gallop  Abdomen: soft, non-tender; bowel sounds normal; no masses, no organomegaly  Musculoskeletal: ROM normal in all joints, no crepitus, no deformity, Normal muscle strengthen. Back  is symmetric, no curvature. Skin: Skin color, texture, turgor normal. No rashes or lesions  Lymph nodes: Cervical, supraclavicular, and axillary nodes normal.   Neurologic: CN 2 -12 Normal, Normal symmetric reflexes. Normal coordination and gait  Psych: Alert & Oriented x 3, Mood appear stable.    Assessment:    Annual wellness medicare exam   Plan:    During the course of the visit the patient was educated and counseled about appropriate screening and preventive services including:   Declines flu vaccine     Patient Instructions (the written plan) was given to the patient.  Medicare Attestation  I have personally reviewed:  The patient's medical and social history  Their use of alcohol, tobacco or illicit drugs  Their current medications and supplements  The patient's functional ability including ADLs,fall risks, home safety risks, cognitive, and hearing and visual impairment  Diet and physical activities  Evidence for depression or mood disorders  The patient's weight, height, BMI, and visual acuity have been recorded in the chart. I have made referrals, counseling, and provided education to the patient based on review of the above and I have provided the patient with a written personalized care plan for preventive services.

## 2016-11-08 NOTE — Patient Instructions (Signed)
It was a pleasure to see you today. Tetanus immunization update given. Flu vaccine declined. Return in one year or as needed.

## 2016-11-09 ENCOUNTER — Other Ambulatory Visit: Payer: Self-pay | Admitting: Internal Medicine

## 2016-12-02 ENCOUNTER — Encounter: Payer: Self-pay | Admitting: Internal Medicine

## 2016-12-06 ENCOUNTER — Telehealth: Payer: Self-pay

## 2016-12-06 MED ORDER — AMLODIPINE BESYLATE 5 MG PO TABS: ORAL_TABLET | ORAL | 3 refills | Status: DC

## 2016-12-06 NOTE — Telephone Encounter (Signed)
Received fax from [facility] in regards to a refill on Amlodipine 5 mg for patient. Medication was refilled per Dr. Verlene Mayer request. Sent 1 year

## 2016-12-26 ENCOUNTER — Telehealth: Payer: Self-pay

## 2016-12-26 MED ORDER — SIMVASTATIN 40 MG PO TABS: 40.0000 mg | ORAL_TABLET | Freq: Every day | ORAL | 3 refills | Status: DC

## 2016-12-26 NOTE — Telephone Encounter (Signed)
Received fax from [facility] in regards to a refill on Simvastatin 40 mg for patient. Medication was refilled per Dr. Verlene Mayer request. Sent 1 year supply

## 2017-01-24 IMAGING — CR DG LUMBAR SPINE 2-3V
3 series · 3 of 3 positions shown · non-contrast
Comparison: Pelvis film of 10/09/2005

CLINICAL DATA: Chronic low back and left hip pain, no trauma

EXAM:
LUMBAR SPINE - 2-3 VIEW

[t l-spine a.p.]
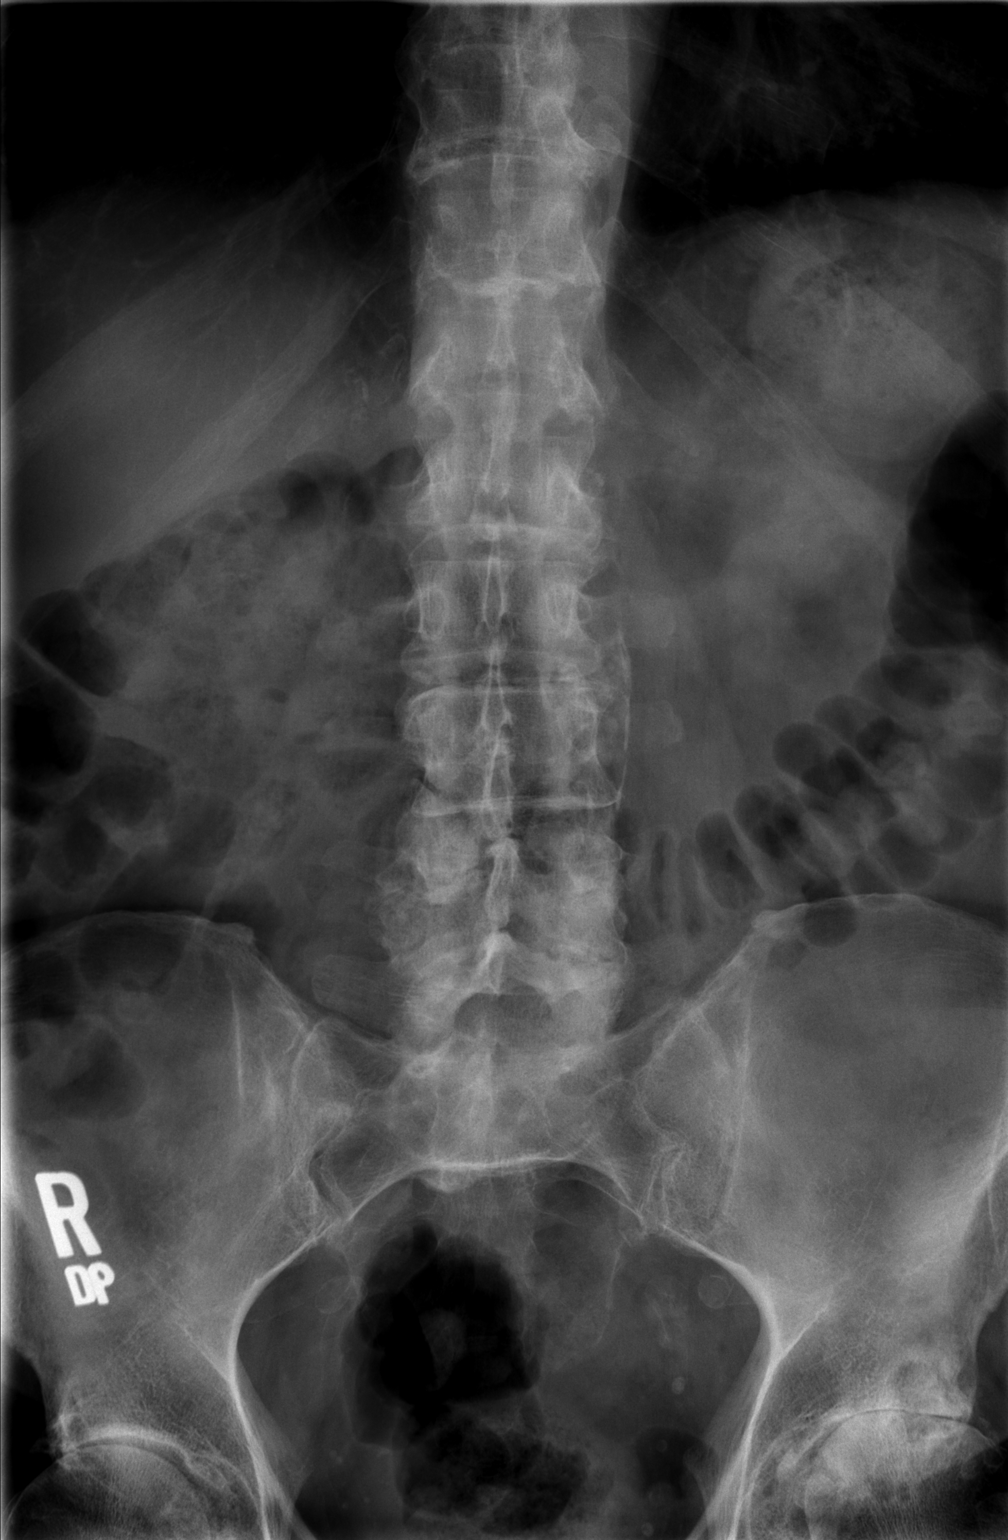

[t l-spine lat]
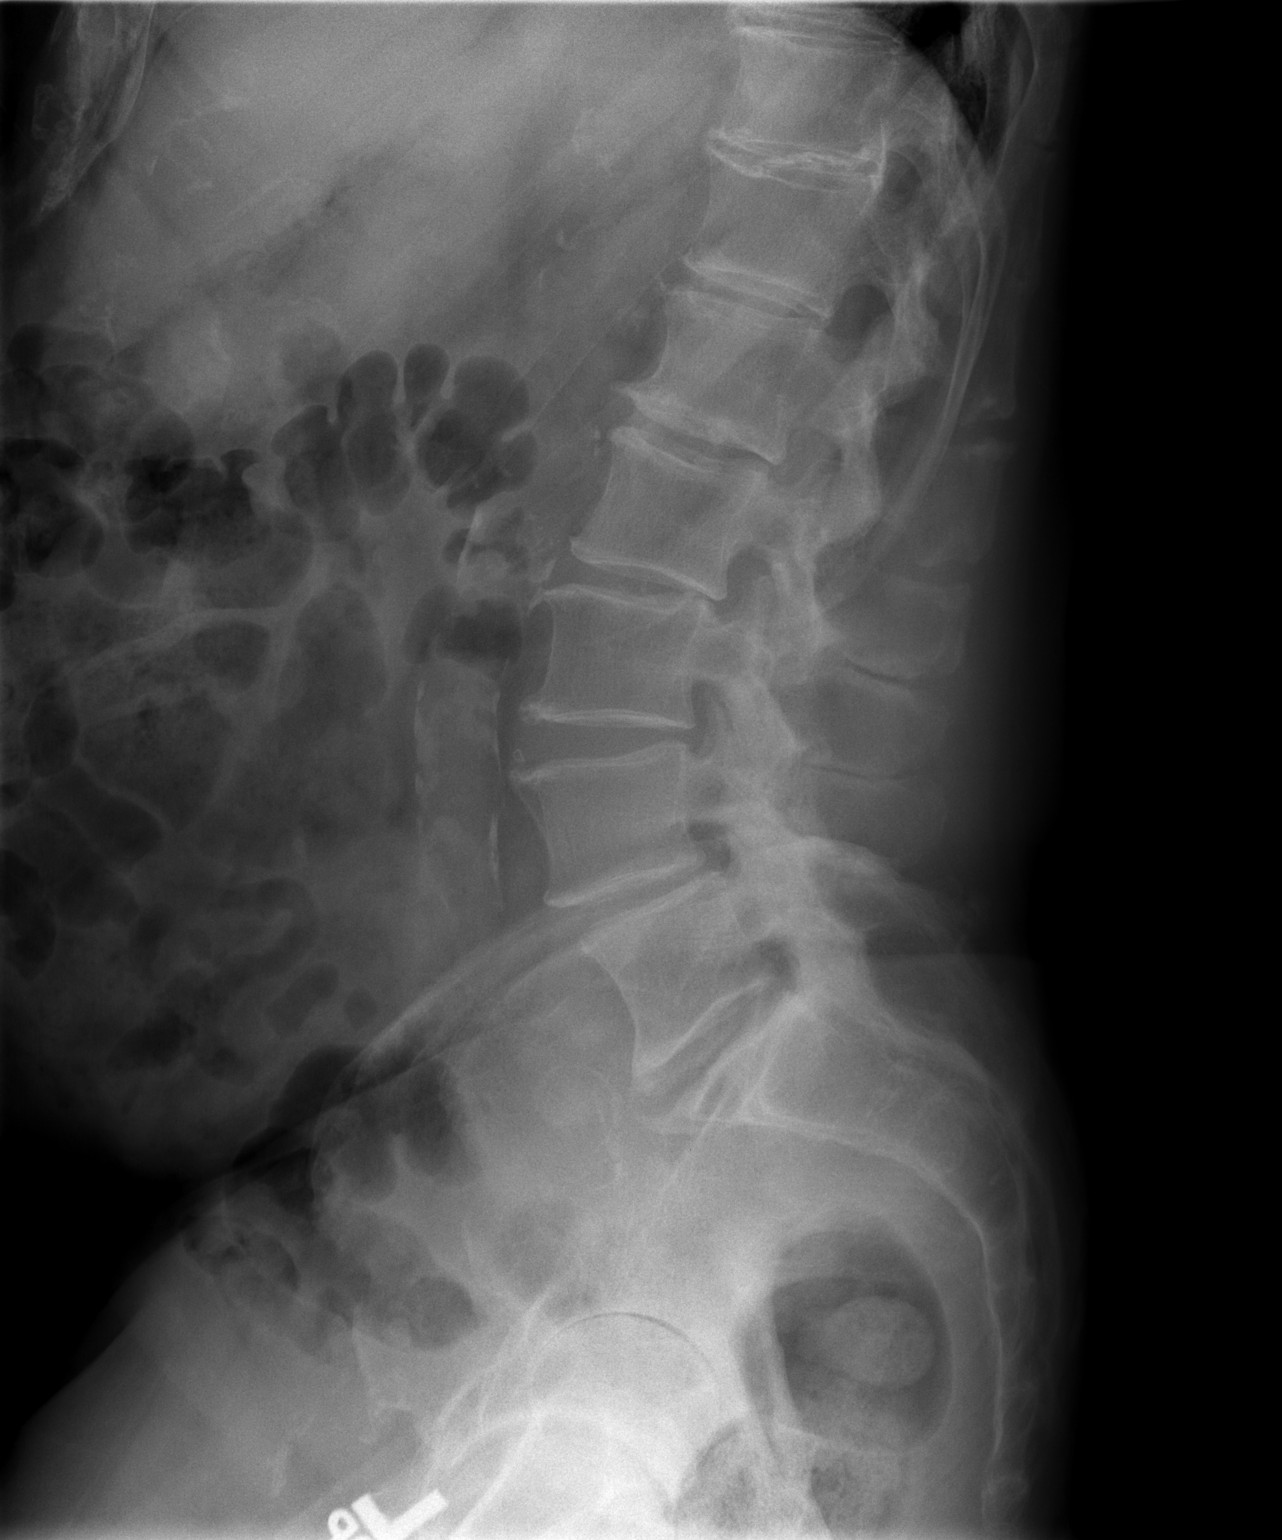

[t l-spine l5-s1 spot]
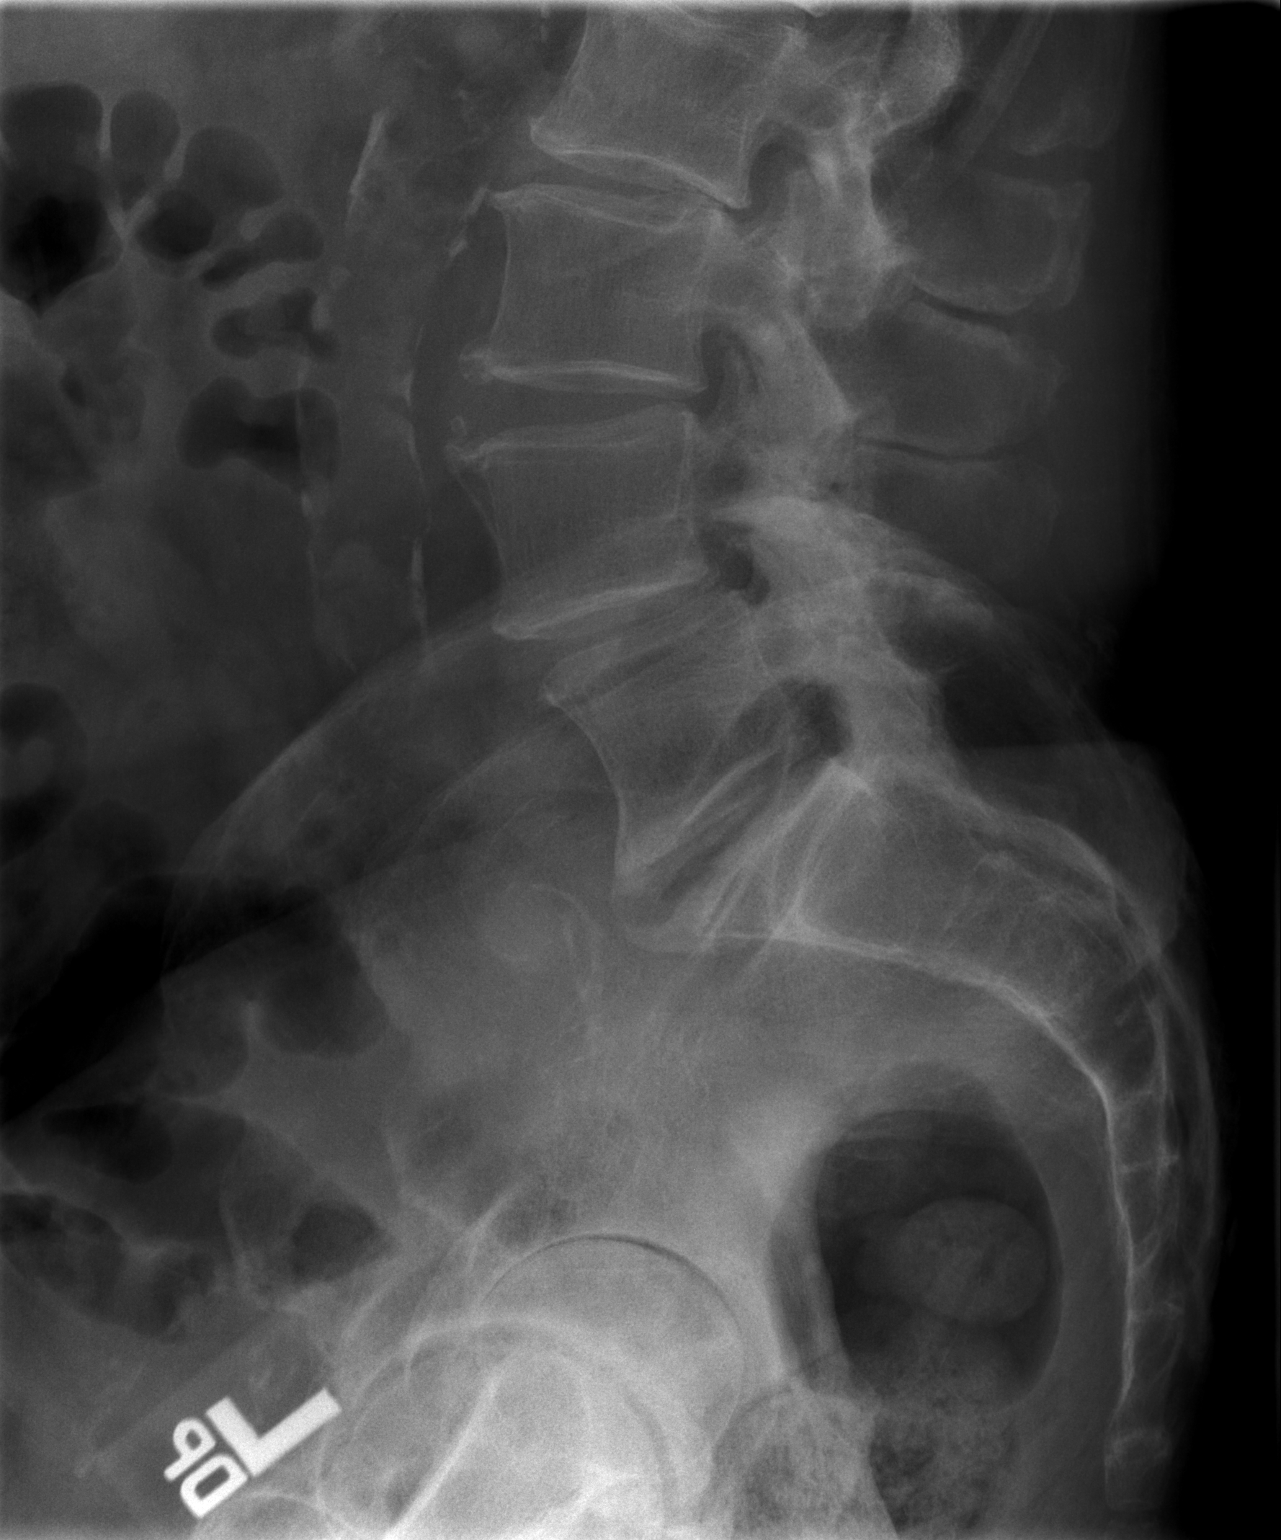

[3 of 3 positions shown; findings below may reference images not displayed]

FINDINGS: The lumbar vertebrae are in normal alignment. There is degenerative
disc disease diffusely with some sparing of the L3-4 and L5-S1
levels. No compression deformity is seen. There is degenerative
change primarily involving the facet joints of L3-4, L4-5, and L5-S1
levels. Advanced degenerative joint disease of the hips is noted
left-greater-than-right. The SI joints are corticated.
IMPRESSION: 1. Diffuse degenerative disc disease throughout the lumbar spine,
with some sparing of L3-4 and L5-S1.
2. No compression deformity
3. Advanced degenerative joint disease of the hips,
left-greater-than-right.

## 2017-03-08 IMAGING — CR DG CHEST 2V
2 series · 2 of 2 positions shown · non-contrast
Comparison: CT of the chest on 11/17/2009

CLINICAL DATA: Preoperative respiratory exam for left hip
arthroplasty.

EXAM:
CHEST - 2 VIEW

[w chest pa]
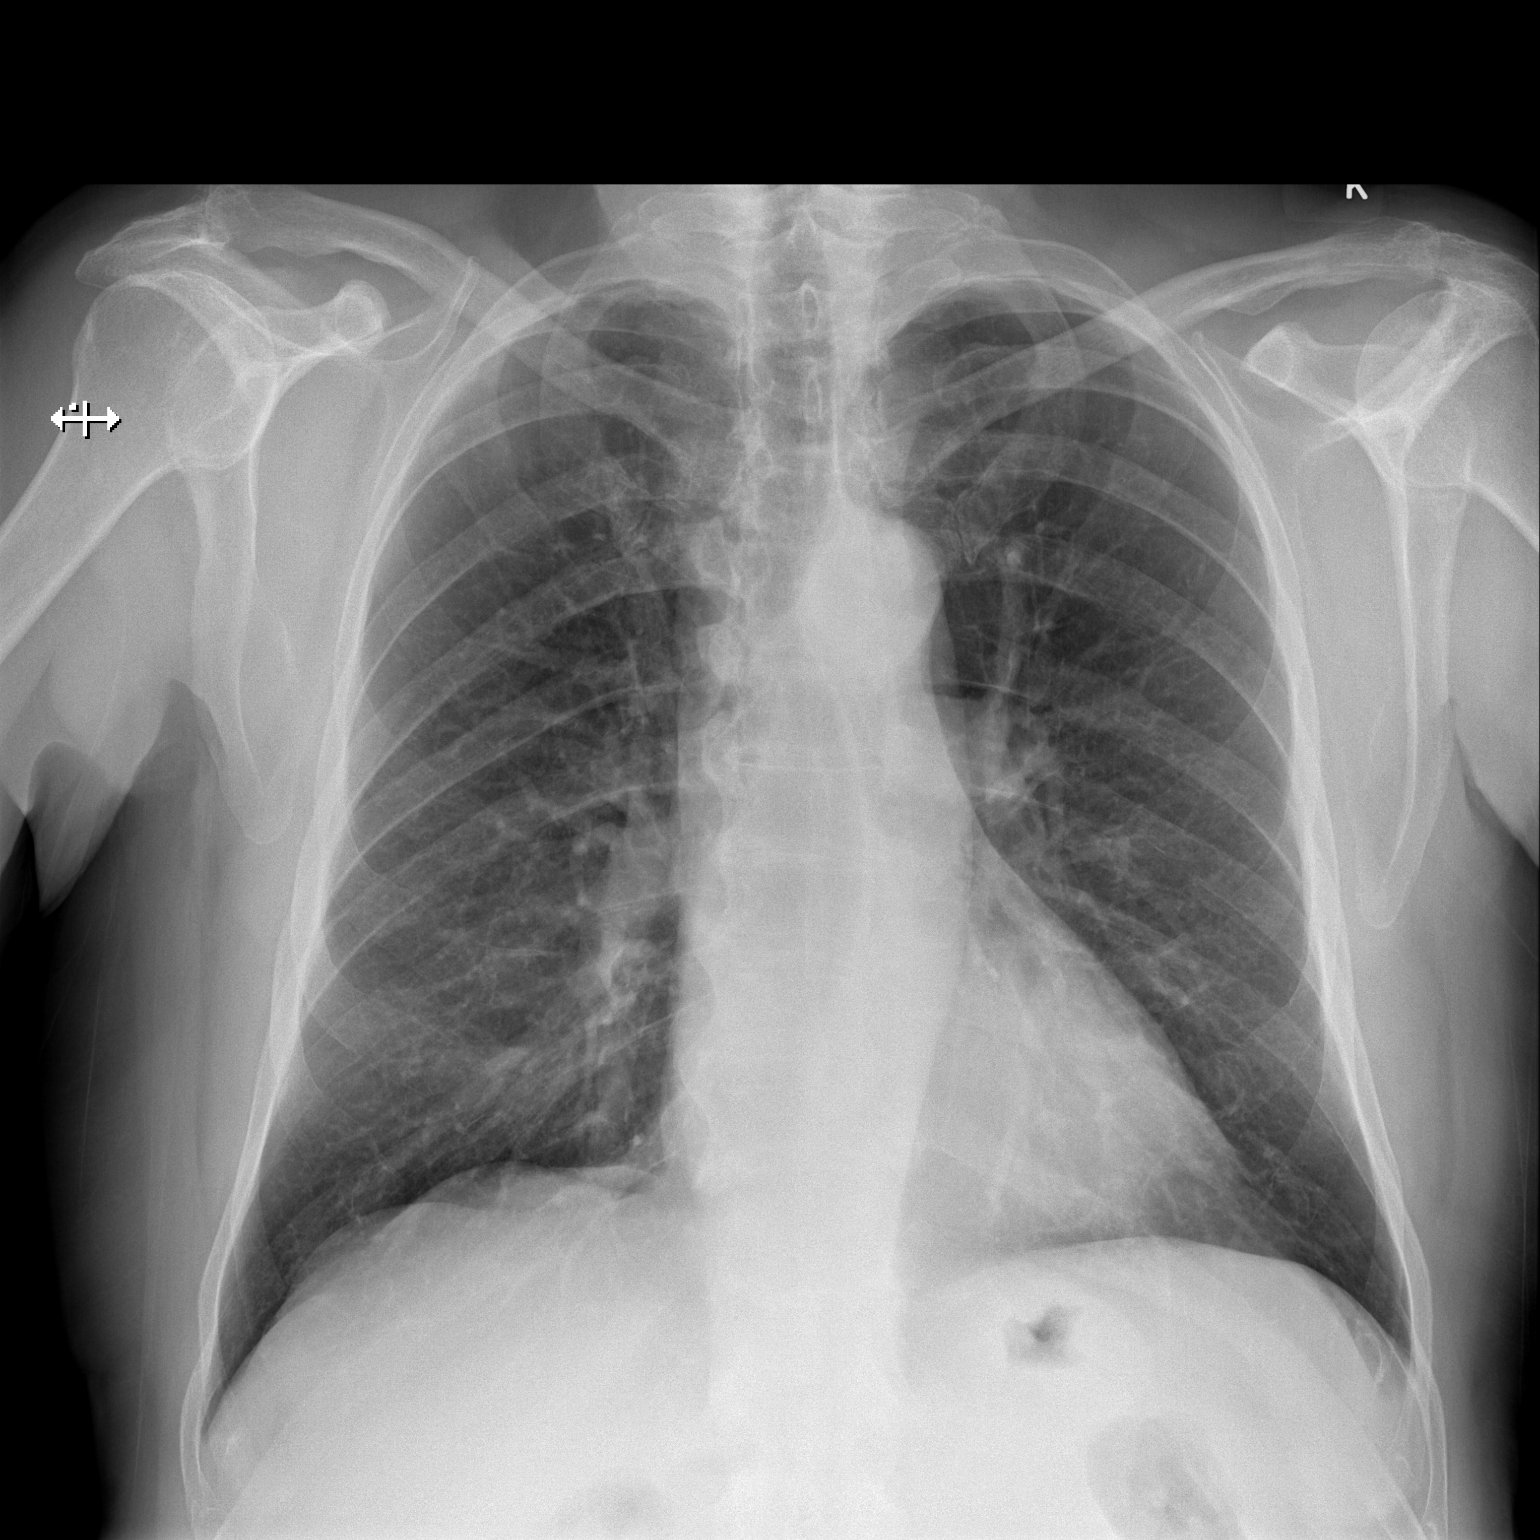

[w chest lat]
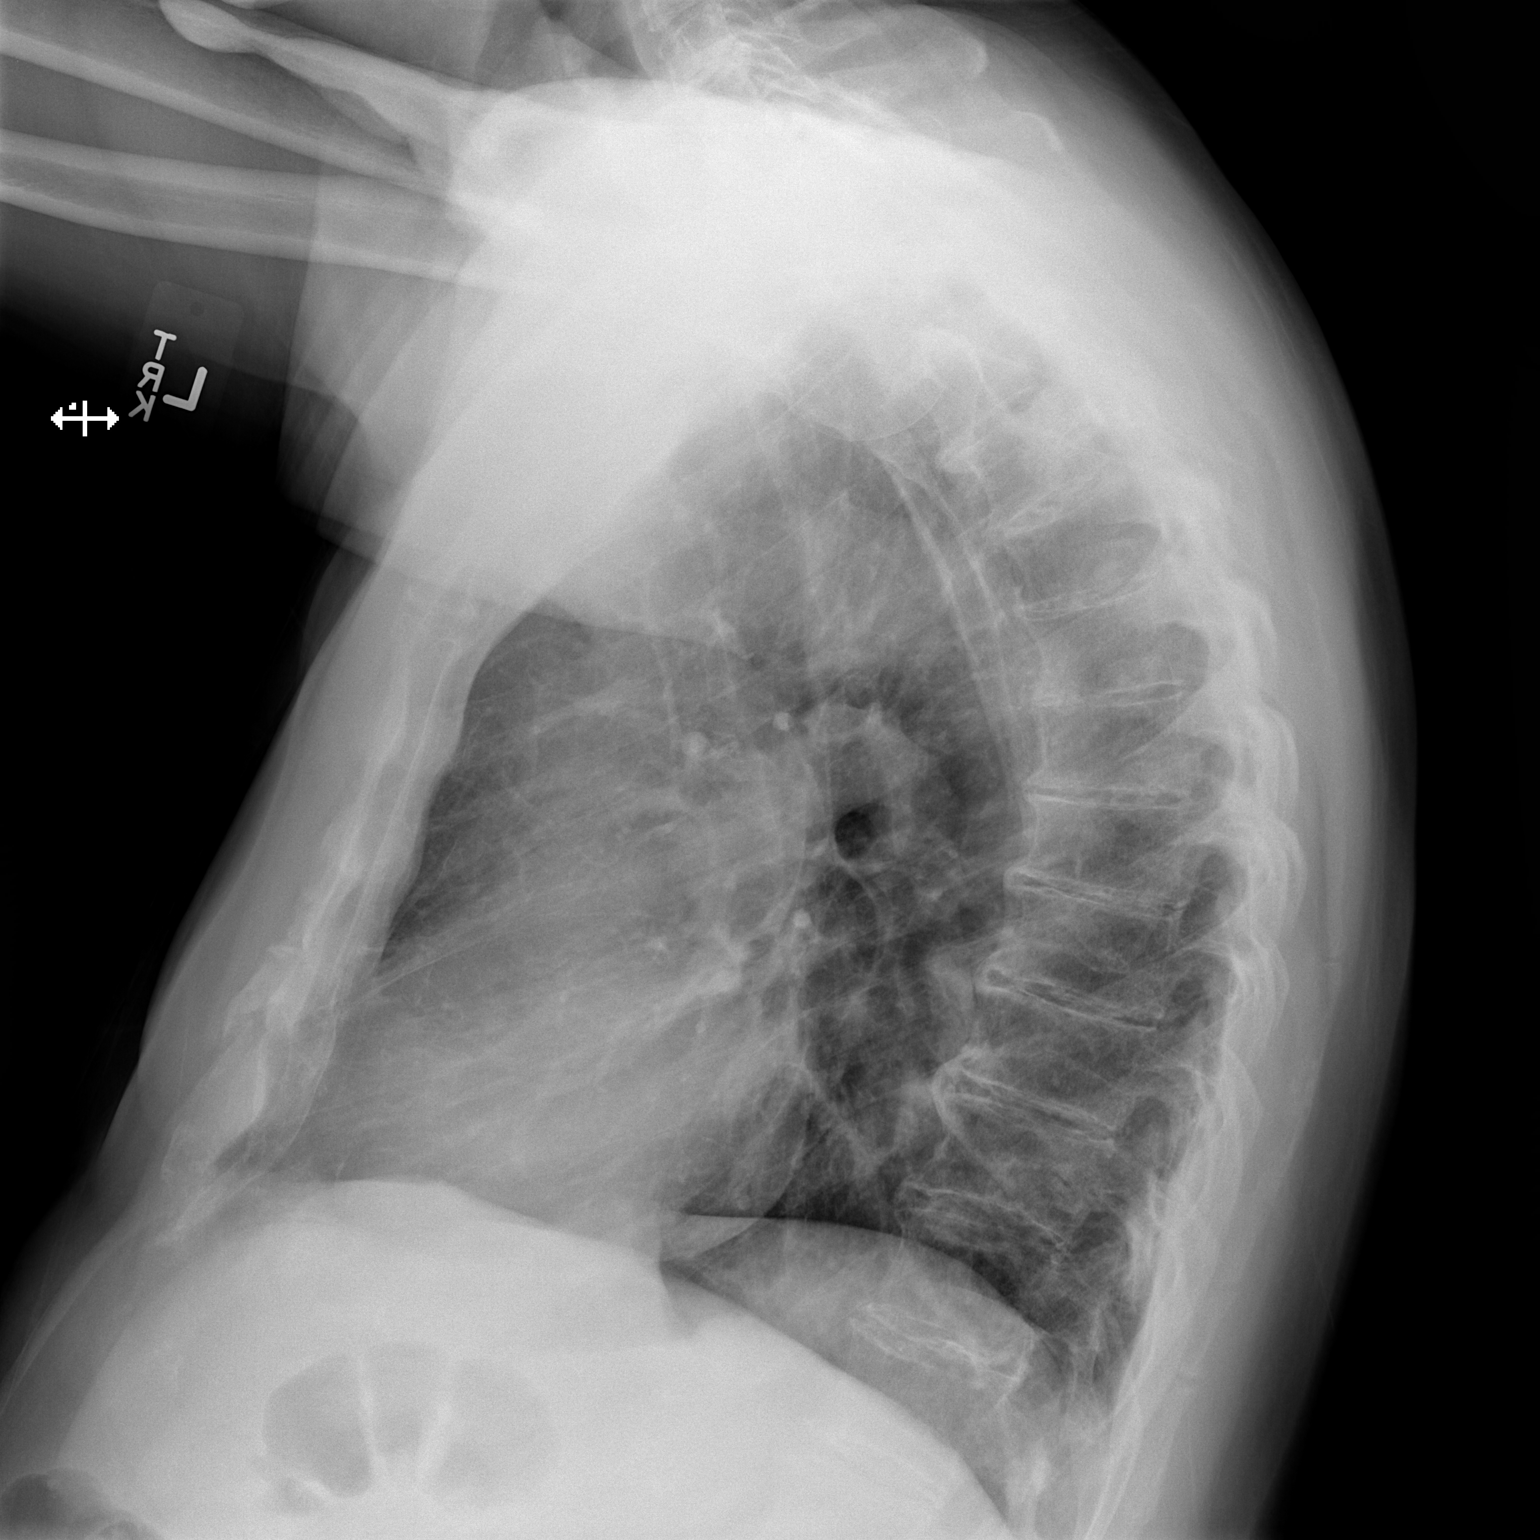

[2 of 2 positions shown; findings below may reference images not displayed]

FINDINGS: The heart size and mediastinal contours are within normal limits.
Mildly prominent pulmonary interstitium without edema or fibrosis.
There is no evidence of pulmonary consolidation, pneumothorax,
nodule or pleural fluid. The bony thorax shows spondylosis of the
thoracic spine.
IMPRESSION: No active disease. Mildly prominent interstitium is suggestive of
mild chronic lung disease.

## 2017-03-15 DIAGNOSIS — Z85828 Personal history of other malignant neoplasm of skin: Secondary | ICD-10-CM | POA: Diagnosis not present

## 2017-03-15 DIAGNOSIS — L570 Actinic keratosis: Secondary | ICD-10-CM | POA: Diagnosis not present

## 2017-03-15 DIAGNOSIS — D2261 Melanocytic nevi of right upper limb, including shoulder: Secondary | ICD-10-CM | POA: Diagnosis not present

## 2017-03-15 DIAGNOSIS — D1801 Hemangioma of skin and subcutaneous tissue: Secondary | ICD-10-CM | POA: Diagnosis not present

## 2017-03-15 DIAGNOSIS — C44712 Basal cell carcinoma of skin of right lower limb, including hip: Secondary | ICD-10-CM | POA: Diagnosis not present

## 2017-03-15 DIAGNOSIS — L820 Inflamed seborrheic keratosis: Secondary | ICD-10-CM | POA: Diagnosis not present

## 2017-03-15 DIAGNOSIS — L821 Other seborrheic keratosis: Secondary | ICD-10-CM | POA: Diagnosis not present

## 2017-03-19 ENCOUNTER — Encounter: Payer: Self-pay | Admitting: Internal Medicine

## 2017-05-08 ENCOUNTER — Other Ambulatory Visit: Payer: Self-pay | Admitting: Sports Medicine

## 2017-05-29 DIAGNOSIS — N401 Enlarged prostate with lower urinary tract symptoms: Secondary | ICD-10-CM | POA: Diagnosis not present

## 2017-06-11 ENCOUNTER — Ambulatory Visit: Payer: Medicare Other | Admitting: Sports Medicine

## 2017-06-11 ENCOUNTER — Encounter: Payer: Self-pay | Admitting: Sports Medicine

## 2017-06-11 VITALS — BP 122/82 | Ht 69.0 in | Wt 195.0 lb

## 2017-06-11 DIAGNOSIS — M79672 Pain in left foot: Secondary | ICD-10-CM

## 2017-06-11 DIAGNOSIS — M25511 Pain in right shoulder: Secondary | ICD-10-CM | POA: Diagnosis not present

## 2017-06-11 NOTE — Progress Notes (Signed)
Chief complaint: Left heel pain x 1 month; right shoulder pain x 2 weeks  History of present illness: Luis Cisneros is a 77 year old right-hand-dominant male who presents to the sports medicine office with a couple of concerns today.  The more pressing issue for him today is left heel pain.  He points to the proximal aspect of his left heel about half a centimeter from the calcaneal insertion at the plantar fascia as to where he feels the pain.  He reports that symptoms have been present for approximately 1 month now.  He does not report of any specific inciting incident, trauma, or injury to explain the pain.  He reports mainly noticing the pain when he is walking uphill or when he is flexing his toes and trying to push up with the ball of his foot.  He describes the pain as a throbbing and aching pain.  He does not report of any numbness, tingling, burning paresthesias.  He does not report of any pain first thing when he wakes up and takes the first step out of bed in the morning.  He does not report of any swelling, warmth, erythema, or ecchymosis.  He is a Animator and often gets on ladders.  He does take tramadol for chronic pain, otherwise no other medications.  He has iced intermittently.  The second issue for him today is right posterosuperior shoulder pain.  He reports that symptoms have been present for approximately 2 weeks now.  He reports that the issue occurred when he had a fall on outstretched arm while with a client and her dog.  He reports having an aching pain in his posterosuperior shoulder.  He reports initially he had difficulty abducting and externally rotating his right arm, had to use his left arm for assistance.  He reports over the last 2 weeks symptoms have significantly improved.  He does not report of any numbness, tingling, or burning paresthesias.  Other than tramadol he has not used any other medications.  He does not report of any neck pain.  He describes the pain is an occasional  throbbing pain.  He reports difficulty laying on his right side at nighttime secondary to pain.  He does not report of any weakness in his right arm at this time.  He does not report of any previous right shoulder injury or trauma.  Review of systems:  As stated above  Interval past medical history, surgical history, family history, and social history obtained and unchanged.  His past medical history is notable for hyperlipidemia, seasonal affective disorder, and history of pulmonary embolism currently not on anticoagulation; surgical history notable for left total hip arthroplasty and tonsillectomy; he does not report of any current tobacco use; family history notable for aortic aneurysm and heart disease; allergies and medications are reviewed and are reflected in EMR.  Physical exam: Vital signs are reviewed and are documented in the chart Gen.: Alert, oriented, appears stated age, in no apparent distress HEENT: Moist oral mucosa Respiratory: Normal respirations, able to speak in full sentences Cardiac: Regular rate, distal pulses 2+ Integumentary: No rashes on visible skin:  Neurologic: Strength 5/5 with right rotator cuff strength, strength also 5/5 with left ankle dorsiflexion, plantarflexion, inversion, eversion, sensation 2+ in bilateral upper extremities as well as bilateral feet and ankles Psych: Normal affect, mood is described as good Musculoskeletal: Inspection of left foot reveals no obvious deformity or muscle atrophy, no warmth, erythema, ecchymosis, or effusion, he has slightly tender to deep palpation over the  proximal plantar fascia about half a centimeter distal to its insertion on the calcaneus, no other tenderness elsewhere in the plantar aspect of his left foot, no tenderness over the Achilles tendon, no tenderness over the medial or lateral malleolus, no tenderness over the anterior talus, he does have full ankle range of motion and strength, on gait analysis he does have  collapsible pes planus with dynamic pronation, does have slightly collapsed transverse arch and longitudinal arch  Limited musculoskeletal ultrasound was performed in the office today of his left heel: -He does have some very slight hypoechoic changes noted at the calcaneal insertion of the plantar fascia, plantar fascial length was measured to be 0.58 cm, no evidence of neovascularization seen -Unremarkable appearing distal plantar fascia  Impression: Mild left plantar fasciitis   Assessment and plan: 1. Left heel pain, suspect clinical correlation to plantar fasciitis 2. Right shoulder pain, with suspicion of infraspinatus rotator cuff tear, with clinical improvement since initial injury 3. Gait abnormality, with collapsible pes planus with dynamic pronation  Plan: For the left heel, discussed with Hitoshi that symptoms seem to be consistent with plantar fasciitis.  No evidence on exam to say that this is related to fat pad atrophy, Achilles tendinopathy, retrocalcaneal bursitis, or flexor tendinopathy.  Did provide him with arch strap, as well as temporary green insoles with scaphoid padding.  This was fitted to his satisfaction.  Discussed plantar fascia home exercise program to do at home.  Discussed also option of custom orthotics to be made here in a month if he does well with these insoles.  In regards to his right shoulder, discussed that clinical symptoms seem to be related with partial tear of the infraspinatus tendon, specifically with having pain and weakness initially with external rotation.  He has intact rotator cuff strength, full range of motion, and no rotator cuff impingement symptoms today. Discussed that these partial tears can heal on its own without any additional therapy.  It is good prognostically that he is having improvement in his symptoms without any limitations in strength or range of motion.  Will plan to have him follow-up in 1 month for reevaluation or sooner as  needed. If continued shoulder pain, will plan for ultrasound at that time.     Mort Sawyers, MD Primary Care Sports Medicine Fellow [facility]  Patient seen and evaluated with the sports medicine fellow. I agree with the above plan of care. We will treat the patient's left heel pain as plantar fasciitis.Treatment as above. Patient's right shoulder pain is likely due to some degree of rotator cuff tearing. He has full range of motion and great strength on exam today. His symptoms are improving so we will take a watchful waiting approach. Follow-up in 4 weeks for reevaluation. If he does not continue to improve, consider ultrasound of the right shoulder at follow-up. Patient will call with questions or concerns prior to his follow-up visit.

## 2017-06-12 ENCOUNTER — Encounter: Payer: Self-pay | Admitting: Sports Medicine

## 2017-07-11 ENCOUNTER — Ambulatory Visit: Payer: Medicare Other | Admitting: Sports Medicine

## 2017-07-15 ENCOUNTER — Encounter: Payer: Self-pay | Admitting: Sports Medicine

## 2017-07-15 ENCOUNTER — Ambulatory Visit: Payer: Medicare Other | Admitting: Sports Medicine

## 2017-07-15 VITALS — BP 132/82 | Ht 69.0 in | Wt 192.0 lb

## 2017-07-15 DIAGNOSIS — M25511 Pain in right shoulder: Secondary | ICD-10-CM

## 2017-07-15 DIAGNOSIS — M79672 Pain in left foot: Secondary | ICD-10-CM | POA: Diagnosis not present

## 2017-07-15 NOTE — Progress Notes (Signed)
   Subjective:    Patient ID: Luis Cisneros, male    DOB: [birth year], 77 y.o.   MRN: [id number]  HPI Pt presents for 1 month f/u for L hell and R shoulder pain. He had had L heel pain for 1 month at the time of the previous visit and says that it has now "abated." He was unable to do some of his exercises due to lack of strength, particularly the leg raises on the edge of a step, but has been stretching in bed and moving his feet. His foot has felt much better and he has not had any limping for the past 7-10 days and is no longer concerned about his foot. He is wearing slippers today because he uses a sock machine to put on socks and did not want to have to bring that to the office. His L heel is still slightly sore at times. His R shoulder had been hurting for 2 weeks at the time of the prior visit after a [place] injury involving tripping over a dog. He is much improved. He says he has a dully, achy 2/10 pain at night and feels best when sleeping with his R arm above him head. When he lies on his L side and has his RUE in a more neutral position it bothers him more. He denies any point TTP and says when it bothers him he feels sore all over. 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 He initially had trouble driving his stick shift and had to manually use his LUE to assist placement of his RUE but is no longer having to do that. He has Rx Ultram which he rarely takes. He takes Mobic daily for his arthritis pain and it helps. He has taken 1,000mg  tylenol and it helped his R shoulder pain. No numbness or tingling. He is icing occasionally.    Review of Systems As above    Objective:   Physical Exam Pt seated in chair at [facility] in [place].  R shoulder: NL inspection No point TTP on exam. NL ROM and no pain elicited with ROM. No crepitus. Readily lifts his RUE above his head Strength testing is NL, all movements 5/5. NL external and internal rotation. NL abduction and adduction. NL  supraspinatus strength. Shoulder is stable with no popping, clicking, or crepitus NVI with 2/2 R radial and R ulnar pulses.  L foot/heel: NL inspection, Foot is NL color and temperature NL palpation, no reproducible pain in the L heel NL ROM of the L foot and ankle including both dorsi and plantar flexion NL Strength of L foot and ankle L foot and ankle are stable NVI with 2/2 L PT and DP pulses.        Assessment & Plan:  Reassessment of probable R infraspinatus rotator cuff tear and mild L heel plantar fasciitis.  Pt is here due to requested 1 month f/u from previous exam.  L heel: Overall improved. Pt did some of the exercises. He did not tolerate the arch compression sleeve. He does not feel he needs custom orthotics at this time and does not have any current concerns regarding his heel. R shoulder: Overall improved. ROM back to NL. NL strength and daily activities using his RUE. He is taking tylenol and icing as needed and was encouraged to continue doing so. F/u prn, particularly if new concerns regarding use of the R shoulder.

## 2017-07-29 ENCOUNTER — Other Ambulatory Visit: Payer: Self-pay | Admitting: Internal Medicine

## 2017-10-01 DIAGNOSIS — L570 Actinic keratosis: Secondary | ICD-10-CM | POA: Diagnosis not present

## 2017-11-06 ENCOUNTER — Other Ambulatory Visit: Payer: Self-pay | Admitting: Internal Medicine

## 2017-11-06 DIAGNOSIS — Z125 Encounter for screening for malignant neoplasm of prostate: Secondary | ICD-10-CM

## 2017-11-06 DIAGNOSIS — I491 Atrial premature depolarization: Secondary | ICD-10-CM

## 2017-11-06 DIAGNOSIS — M1610 Unilateral primary osteoarthritis, unspecified hip: Secondary | ICD-10-CM

## 2017-11-06 DIAGNOSIS — E785 Hyperlipidemia, unspecified: Secondary | ICD-10-CM

## 2017-11-06 DIAGNOSIS — Z0000 Encounter for general adult medical examination without abnormal findings: Secondary | ICD-10-CM

## 2017-11-06 DIAGNOSIS — M7918 Myalgia, other site: Secondary | ICD-10-CM

## 2017-11-06 DIAGNOSIS — N529 Male erectile dysfunction, unspecified: Secondary | ICD-10-CM

## 2017-11-06 DIAGNOSIS — I10 Essential (primary) hypertension: Secondary | ICD-10-CM

## 2017-11-06 DIAGNOSIS — Z86711 Personal history of pulmonary embolism: Secondary | ICD-10-CM

## 2017-11-08 ENCOUNTER — Other Ambulatory Visit: Payer: Medicare Other | Admitting: Internal Medicine

## 2017-11-08 DIAGNOSIS — M7918 Myalgia, other site: Secondary | ICD-10-CM

## 2017-11-08 DIAGNOSIS — M1610 Unilateral primary osteoarthritis, unspecified hip: Secondary | ICD-10-CM

## 2017-11-08 DIAGNOSIS — Z125 Encounter for screening for malignant neoplasm of prostate: Secondary | ICD-10-CM

## 2017-11-08 DIAGNOSIS — N529 Male erectile dysfunction, unspecified: Secondary | ICD-10-CM

## 2017-11-08 DIAGNOSIS — E785 Hyperlipidemia, unspecified: Secondary | ICD-10-CM

## 2017-11-08 DIAGNOSIS — I10 Essential (primary) hypertension: Secondary | ICD-10-CM | POA: Diagnosis not present

## 2017-11-08 DIAGNOSIS — I491 Atrial premature depolarization: Secondary | ICD-10-CM

## 2017-11-08 DIAGNOSIS — Z0000 Encounter for general adult medical examination without abnormal findings: Secondary | ICD-10-CM

## 2017-11-08 DIAGNOSIS — Z86711 Personal history of pulmonary embolism: Secondary | ICD-10-CM

## 2017-11-08 LAB — COMPLETE METABOLIC PANEL WITH GFR
AG Ratio: 1.3 (calc) (ref 1.0–2.5)
ALT: 21 U/L (ref 9–46)
AST: 23 U/L (ref 10–35)
Albumin: 4 g/dL (ref 3.6–5.1)
Alkaline phosphatase (APISO): 74 U/L (ref 40–115)
BILIRUBIN TOTAL: 0.6 mg/dL (ref 0.2–1.2)
BUN: 23 mg/dL (ref 7–25)
CALCIUM: 9 mg/dL (ref 8.6–10.3)
CHLORIDE: 105 mmol/L (ref 98–110)
CO2: 28 mmol/L (ref 20–32)
CREATININE: 0.82 mg/dL (ref 0.70–1.18)
GFR, Est African American: 99 mL/min/{1.73_m2} (ref >=60)
GFR, Est Non African American: 85 mL/min/{1.73_m2} (ref >=60)
GLUCOSE: 101 mg/dL — AB (ref 65–99)
Globulin: 3.1 g/dL (calc) (ref 1.9–3.7)
Potassium: 4.1 mmol/L (ref 3.5–5.3)
Sodium: 140 mmol/L (ref 135–146)
TOTAL PROTEIN: 7.1 g/dL (ref 6.1–8.1)

## 2017-11-08 LAB — CBC WITH DIFFERENTIAL/PLATELET
Basophils Absolute: 31 cells/uL (ref 0–200)
Basophils Relative: 0.6 %
EOS ABS: 224 {cells}/uL (ref 15–500)
Eosinophils Relative: 4.3 %
HEMATOCRIT: 42.1 % (ref 38.5–50.0)
HEMOGLOBIN: 14.1 g/dL (ref 13.2–17.1)
LYMPHS ABS: 1492 {cells}/uL (ref 850–3900)
MCH: 29.8 pg (ref 27.0–33.0)
MCHC: 33.5 g/dL (ref 32.0–36.0)
MCV: 89 fL (ref 80.0–100.0)
MPV: 9.2 fL (ref 7.5–12.5)
Monocytes Relative: 13.6 %
NEUTROS PCT: 52.8 %
Neutro Abs: 2746 cells/uL (ref 1500–7800)
Platelets: 233 10*3/uL (ref 140–400)
RBC: 4.73 10*6/uL (ref 4.20–5.80)
RDW: 12.5 % (ref 11.0–15.0)
Total Lymphocyte: 28.7 %
WBC: 5.2 10*3/uL (ref 3.8–10.8)
WBCMIX: 707 {cells}/uL (ref 200–950)

## 2017-11-08 LAB — LIPID PANEL
Cholesterol: 187 mg/dL (ref <200)
HDL: 58 mg/dL (ref >40)
LDL Cholesterol (Calc): 112 mg/dL (calc) — ABNORMAL HIGH
Non-HDL Cholesterol (Calc): 129 mg/dL (calc) (ref <130)
TRIGLYCERIDES: 83 mg/dL (ref <150)
Total CHOL/HDL Ratio: 3.2 (calc) (ref <5.0)

## 2017-11-08 LAB — PSA: PSA: 0.7 ng/mL (ref <=4.0)

## 2017-11-12 ENCOUNTER — Ambulatory Visit (INDEPENDENT_AMBULATORY_CARE_PROVIDER_SITE_OTHER): Payer: Medicare Other | Admitting: Internal Medicine

## 2017-11-12 ENCOUNTER — Encounter: Payer: Self-pay | Admitting: Internal Medicine

## 2017-11-12 VITALS — BP 140/80 | HR 80 | Ht 69.0 in | Wt 197.0 lb

## 2017-11-12 DIAGNOSIS — R8290 Unspecified abnormal findings in urine: Secondary | ICD-10-CM | POA: Diagnosis not present

## 2017-11-12 DIAGNOSIS — Z125 Encounter for screening for malignant neoplasm of prostate: Secondary | ICD-10-CM

## 2017-11-12 DIAGNOSIS — E785 Hyperlipidemia, unspecified: Secondary | ICD-10-CM | POA: Diagnosis not present

## 2017-11-12 DIAGNOSIS — Z96642 Presence of left artificial hip joint: Secondary | ICD-10-CM | POA: Diagnosis not present

## 2017-11-12 DIAGNOSIS — N529 Male erectile dysfunction, unspecified: Secondary | ICD-10-CM

## 2017-11-12 DIAGNOSIS — H905 Unspecified sensorineural hearing loss: Secondary | ICD-10-CM

## 2017-11-12 DIAGNOSIS — Z86711 Personal history of pulmonary embolism: Secondary | ICD-10-CM | POA: Diagnosis not present

## 2017-11-12 DIAGNOSIS — Z0000 Encounter for general adult medical examination without abnormal findings: Secondary | ICD-10-CM

## 2017-11-12 DIAGNOSIS — I10 Essential (primary) hypertension: Secondary | ICD-10-CM | POA: Diagnosis not present

## 2017-11-12 DIAGNOSIS — I491 Atrial premature depolarization: Secondary | ICD-10-CM

## 2017-11-12 DIAGNOSIS — F338 Other recurrent depressive disorders: Secondary | ICD-10-CM

## 2017-11-12 LAB — POCT URINALYSIS DIPSTICK
Bilirubin, UA: NEGATIVE
Blood, UA: NEGATIVE
Glucose, UA: NEGATIVE
Ketones, UA: NEGATIVE
Nitrite, UA: NEGATIVE
Protein, UA: NEGATIVE
Spec Grav, UA: 1.015 (ref 1.010–1.025)
Urobilinogen, UA: 0.2 E.U./dL
pH, UA: 6 (ref 5.0–8.0)

## 2017-11-12 NOTE — Progress Notes (Signed)
Subjective:    Patient ID: Luis Cisneros, male    DOB: 11-01-1940, 77 y.o.   MRN: [id number]  HPI 77 year old White Male in excellent physical condition for health maintenance exam and medicare wellness exam. Saw Dr. Diona Fanti for ED recently. Has generic Viagra which was prescribed for him.   Recently injured right shoulder is a fall. Slowly recovering. Says bursa was injured. Continues to work Architect regularly.  Labs reviewed and are WNL.  He is hard of hearing.  Wears hearing aid.  Takes Wellbutrin more for dysthymia rather than depression.  Mood is stable on Wellbutrin.  Tetanus update given 2018.  He declines flu vaccine.  History of hypertension, hyperlipidemia, seasonal affective disorder and erectile dysfunction.  In 2016, he had hip arthroplasty by Dr. Paulo Fruit and is done well.  Old records indicate he has a history of impaired glucose tolerance and vitamin D deficiency.  Hemoglobin A1c in 2011 was 5.7% and his fasting glucose is normal.  At one point became anemic because of donating blood every 6 weeks in 2010.  Now he limits his blood donations.  Past medical history: Degenerative joint disease, cervical radiculopathy 2001, history of bilateral pulmonary emboli status post surgery for leg fracture in 2006.  Takes baby aspirin daily.  History of calcified granuloma right middle lobe.  Quit smoking well over 20 years ago.  Had a CT scan of chest October 2011 showing a noncalcified nodule previously identified within the right middle lobe which was completely stable and considered benign.  Felt to have scarring in the posterior left lower lobe.  He stays physically active remodeling homes.  Social alcohol consumption.  Quit smoking well over 20 years ago.  Married to [employer].  They have no children.  Family history: Father died at age 59 due to lung infection and MI.  Mother died at age 65 with an apparent aortic aneurysm rupture.  2 sisters in good  health.  Patient has been screened for abdominal aortic aneurysm in the past but not recently.  Last done in 2014.      Review of Systems  Constitutional: Negative.   All other systems reviewed and are negative.  Is trace LE on urine dipstick.  Urine culture is unremarkable.    Objective:   Physical Exam  Constitutional: He is oriented to person, place, and time. He appears well-developed and well-nourished. No distress.  HENT:  Head: Normocephalic and atraumatic.  Right Ear: External ear normal.  Left Ear: External ear normal.  Mouth/Throat: Oropharynx is clear and moist. No oropharyngeal exudate.  Eyes: Pupils are equal, round, and reactive to light. Conjunctivae and EOM are normal. Right eye exhibits no discharge. Left eye exhibits no discharge.  Neck: Neck supple. No JVD present. No thyromegaly present.  Cardiovascular: Normal rate, regular rhythm, normal heart sounds and intact distal pulses.  No murmur heard. Pulmonary/Chest: Effort normal and breath sounds normal. No stridor. No respiratory distress. He has no wheezes. He has no rales.  Abdominal: Soft. Bowel sounds are normal. He exhibits no distension and no mass. There is no tenderness. There is no rebound and no guarding.  No bruit  Genitourinary: Prostate normal.  Genitourinary Comments: Deferred to urologist who saw him earlier this year  Musculoskeletal: He exhibits no edema.  Lymphadenopathy:    He has no cervical adenopathy.  Neurological: He is alert and oriented to person, place, and time. He displays normal reflexes. No cranial nerve deficit. He exhibits normal muscle tone. Coordination  normal.  Skin: Skin is warm and dry. No rash noted.  Psychiatric: He has a normal mood and affect. His behavior is normal. Judgment and thought content normal.  Vitals reviewed.         Assessment & Plan:  Normal health maintenance exam  Dysthymia and seasonal affective disorder stable with Wellbutrin  Erectile  dysfunction-has Cialis  Family history of abdominal aortic aneurysm in mother but no aneurysm noted on patient in 2014  Essential hypertension-stable  Hyperlipidemia-stable  Status post hip arthroplasty 2016  Remote history of pulmonary emboli after a fracture.  Now takes baby aspirin.  Remote history of impaired glucose tolerance with normal fasting glucose  History of PACs confirmed on EKG in 2017   Plan: Continue same medications and return in 1 year or as needed.  Subjective:   Patient presents for Medicare Annual/Subsequent preventive examination.  Review Past Medical/Family/Social: See above   Risk Factors  Current exercise habits: Very physically active at work Dietary issues discussed: Low-fat low carbohydrate  Cardiac risk factors: Hyperlipidemia  Depression Screen  (Note: if answer to either of the following is "Yes", a more complete depression screening is indicated)   Over the past two weeks, have you felt down, depressed or hopeless? No  Over the past two weeks, have you felt little interest or pleasure in doing things? No Have you lost interest or pleasure in daily life? No Do you often feel hopeless? No Do you cry easily over simple problems? No   Activities of Daily Living  In your present state of health, do you have any difficulty performing the following activities?:   Driving? No  Managing money? No  Feeding yourself? No  Getting from bed to chair? No  Climbing a flight of stairs? No  Preparing food and eating?: No  Bathing or showering? No  Getting dressed: No  Getting to the toilet? No  Using the toilet:No  Moving around from place to place: No  In the past year have you fallen or had a near fall?:No  Are you sexually active? No  Do you have more than one partner? No   Hearing Difficulties: No  Do you often ask people to speak up or repeat themselves?  Yes Do you experience ringing or noises in your ears? No  Do you have difficulty  understanding soft or whispered voices?  Yes have.  He Do you feel that you have a problem with memory?  Occasionally Do you often misplace items?  Occasionally   Home Safety:  Do you have a smoke alarm at your residence? Yes Do you have grab bars in the bathroom?  Yes Do you have throw rugs in your house?  Yes   Cognitive Testing  Alert? Yes Normal Appearance?Yes  Oriented to person? Yes Place? Yes  Time? Yes  Recall of three objects? Yes  Can perform simple calculations? Yes  Displays appropriate judgment?Yes  Can read the correct time from a watch face?Yes   List the Names of Other Physician/Practitioners you currently use:  See referral list for the physicians patient is currently seeing.     Review of Systems: See above   Objective:     General appearance: Appears younger than stated age and in no acute distress Head: Normocephalic, without obvious abnormality, atraumatic  Eyes: conj clear, EOMi PEERLA  Ears: normal TM's and external ear canals both ears  Nose: Nares normal. Septum midline. Mucosa normal. No drainage or sinus tenderness.  Throat: lips, mucosa, and tongue  normal; teeth and gums normal  Neck: no adenopathy, no carotid bruit, no JVD, supple, symmetrical, trachea midline and thyroid not enlarged, symmetric, no tenderness/mass/nodules  No CVA tenderness.  Lungs: clear to auscultation bilaterally  Breasts: normal appearance, no masses or tenderness Heart: regular rate and rhythm, S1, S2 normal, no murmur, click, rub or gallop  Abdomen: soft, non-tender; bowel sounds normal; no masses, no organomegaly  Musculoskeletal: ROM normal in all joints, no crepitus, no deformity, Normal muscle strengthen. Back  is symmetric, no curvature. Skin: Skin color, texture, turgor normal. No rashes or lesions  Lymph nodes: Cervical, supraclavicular, and axillary nodes normal.  Neurologic: CN 2 -12 Normal, Normal symmetric reflexes. Normal coordination and gait  Psych:  Alert & Oriented x 3, Mood appear stable.    Assessment:    Annual wellness medicare exam   Plan:    During the course of the visit the patient was educated and counseled about appropriate screening and preventive services including:   Declines flu vaccine     Patient Instructions (the written plan) was given to the patient.  Medicare Attestation  I have personally reviewed:  The patient's medical and social history  Their use of alcohol, tobacco or illicit drugs  Their current medications and supplements  The patient's functional ability including ADLs,fall risks, home safety risks, cognitive, and hearing and visual impairment  Diet and physical activities  Evidence for depression or mood disorders  The patient's weight, height, BMI, and visual acuity have been recorded in the chart. I have made referrals, counseling, and provided education to the patient based on review of the above and I have provided the patient with a written personalized care plan for preventive services.

## 2017-11-13 LAB — URINE CULTURE
MICRO NUMBER:: 91208317
SPECIMEN QUALITY:: ADEQUATE

## 2017-12-03 ENCOUNTER — Other Ambulatory Visit: Payer: Self-pay

## 2017-12-03 MED ORDER — MELOXICAM 15 MG PO TABS: 15.0000 mg | ORAL_TABLET | Freq: Every day | ORAL | 6 refills | Status: DC

## 2017-12-04 ENCOUNTER — Other Ambulatory Visit: Payer: Self-pay | Admitting: Internal Medicine

## 2017-12-04 NOTE — Patient Instructions (Addendum)
It was a pleasure to see you today.  Continue current medications and return in 1 year or as needed.  Flu vaccine declined.  LDL cholesterol is slightly elevated at 112.  Fasting glucose is 100.

## 2017-12-19 ENCOUNTER — Other Ambulatory Visit: Payer: Self-pay | Admitting: *Deleted

## 2017-12-19 MED ORDER — TRAMADOL HCL 50 MG PO TABS: 50.0000 mg | ORAL_TABLET | Freq: Four times a day (QID) | ORAL | 1 refills | Status: AC | PRN

## 2018-01-03 ENCOUNTER — Other Ambulatory Visit: Payer: Self-pay | Admitting: Internal Medicine

## 2018-02-14 ENCOUNTER — Ambulatory Visit (INDEPENDENT_AMBULATORY_CARE_PROVIDER_SITE_OTHER): Payer: Medicare Other | Admitting: Internal Medicine

## 2018-02-14 ENCOUNTER — Encounter: Payer: Self-pay | Admitting: Internal Medicine

## 2018-02-14 VITALS — BP 150/80 | HR 78 | Temp 98.0°F | Ht 69.0 in | Wt 199.0 lb

## 2018-02-14 DIAGNOSIS — R768 Other specified abnormal immunological findings in serum: Secondary | ICD-10-CM

## 2018-02-14 DIAGNOSIS — R899 Unspecified abnormal finding in specimens from other organs, systems and tissues: Secondary | ICD-10-CM

## 2018-02-14 NOTE — Progress Notes (Signed)
   Subjective:    Patient ID: Luis Cisneros, male    DOB: 11-Jan-1941, 78 y.o.   MRN: [id number]  HPI Pt has given blood through [facility] for many years. He recently received letter from them stating he had a positive Hepatitis B core antibody, but negative  Hep B surface antigen, and no surface antibody was drawn. No e antigen was drawn. He is married and does not use IV drugs. Does not have extramarital relationships so it is felt that this is likely a false positive. He notes that there was some difficulty getting his blood to flow during the recent donation when he was tested. He understands this eliminates him from further donations although it is likely a mistake. HIV testing was negative. Hep C anibody negative. HBV discriminatory NAT negative.Syphyllis test negative. HTLV antibodies non reactive.  We have drawn Hep B surface antibody today, repeated Hep B core antibody and repeated HepB surface antigen today.   It is my feeling that this result he received from the [facility] is a false positive. Discussion held about this. He understands this is likely a false positive. Labs drawn and pending.    Review of Systems see above     Objective:   Physical Exam  Not examined. No scleral icterus      Assessment & Plan:  Likely false positibe result from [facility]. Labs drawn and pending

## 2018-02-14 NOTE — Patient Instructions (Signed)
Labs drawn and pending

## 2018-02-17 LAB — HEPATITIS B SURFACE ANTIBODY,QUALITATIVE: HEP B S AB: NONREACTIVE

## 2018-02-17 LAB — HEPATITIS B CORE ANTIBODY, TOTAL: HEP B C TOTAL AB: REACTIVE — AB

## 2018-02-17 LAB — HEPATITIS B E ANTIGEN: HEP B E AG: NONREACTIVE

## 2018-02-17 LAB — HEPATITIS B SURFACE ANTIGEN: Hepatitis B Surface Ag: NONREACTIVE

## 2018-04-08 DIAGNOSIS — L821 Other seborrheic keratosis: Secondary | ICD-10-CM | POA: Diagnosis not present

## 2018-04-08 DIAGNOSIS — L570 Actinic keratosis: Secondary | ICD-10-CM | POA: Diagnosis not present

## 2018-04-08 DIAGNOSIS — D2261 Melanocytic nevi of right upper limb, including shoulder: Secondary | ICD-10-CM | POA: Diagnosis not present

## 2018-04-08 DIAGNOSIS — Z85828 Personal history of other malignant neoplasm of skin: Secondary | ICD-10-CM | POA: Diagnosis not present

## 2018-04-08 DIAGNOSIS — C44519 Basal cell carcinoma of skin of other part of trunk: Secondary | ICD-10-CM | POA: Diagnosis not present

## 2018-04-08 DIAGNOSIS — D485 Neoplasm of uncertain behavior of skin: Secondary | ICD-10-CM | POA: Diagnosis not present

## 2018-04-09 ENCOUNTER — Other Ambulatory Visit: Payer: Self-pay | Admitting: Internal Medicine

## 2018-04-21 ENCOUNTER — Encounter: Payer: Self-pay | Admitting: Internal Medicine

## 2018-05-13 ENCOUNTER — Other Ambulatory Visit: Payer: Self-pay | Admitting: Internal Medicine

## 2018-05-30 ENCOUNTER — Other Ambulatory Visit: Payer: Self-pay | Admitting: Sports Medicine

## 2018-07-01 ENCOUNTER — Encounter: Payer: Self-pay | Admitting: Internal Medicine

## 2018-07-01 ENCOUNTER — Ambulatory Visit (INDEPENDENT_AMBULATORY_CARE_PROVIDER_SITE_OTHER): Payer: Medicare Other | Admitting: Internal Medicine

## 2018-07-01 ENCOUNTER — Telehealth: Payer: Self-pay | Admitting: Internal Medicine

## 2018-07-01 ENCOUNTER — Telehealth: Payer: Self-pay

## 2018-07-01 ENCOUNTER — Other Ambulatory Visit: Payer: Medicare Other

## 2018-07-01 DIAGNOSIS — R5383 Other fatigue: Secondary | ICD-10-CM | POA: Diagnosis not present

## 2018-07-01 DIAGNOSIS — R0781 Pleurodynia: Secondary | ICD-10-CM | POA: Diagnosis not present

## 2018-07-01 DIAGNOSIS — R053 Chronic cough: Secondary | ICD-10-CM

## 2018-07-01 DIAGNOSIS — R05 Cough: Secondary | ICD-10-CM

## 2018-07-01 DIAGNOSIS — Z20822 Contact with and (suspected) exposure to covid-19: Secondary | ICD-10-CM

## 2018-07-01 DIAGNOSIS — R400 Somnolence: Secondary | ICD-10-CM | POA: Diagnosis not present

## 2018-07-01 DIAGNOSIS — R6889 Other general symptoms and signs: Secondary | ICD-10-CM | POA: Diagnosis not present

## 2018-07-01 MED ORDER — DOXYCYCLINE HYCLATE 100 MG PO TABS: 100.0000 mg | ORAL_TABLET | Freq: Two times a day (BID) | ORAL | 0 refills | Status: DC

## 2018-07-01 NOTE — Telephone Encounter (Signed)
Luis Cisneros [phone]  Clair Gulling called to say he is having several symptoms, bad cough with phlegm since early April, salty food makes cough worse, loss of appetite, taste has changed,like when drinking his coffee when he is about half way done it taste terrible, if he leaves it for awhile he can go back and drink it, fatigue, has to stop and rest more offten, feels like he can sleep all the time, depression is elevated, he stated that Mimi says he is having shortness of breath and breathing heavier, he hasn't notice that as much. Said his fever is 99.2. Suggested virtual visit to discuss

## 2018-07-01 NOTE — Patient Instructions (Signed)
You will be contacted regarding drive-through [id number] testing.  If COVID-19 test is negative we will proceed with chest x-ray and left rib detail films and perhaps lab studies.
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
## 2018-07-01 NOTE — Telephone Encounter (Signed)
After speaking with Dr Renold Genta, I called Clair Gulling back and scheduled virtual visit.

## 2018-07-01 NOTE — Telephone Encounter (Signed)
Dr. Renold Genta requesting COVID 19 testing.

## 2018-07-03 LAB — NOVEL CORONAVIRUS, NAA: SARS-CoV-2, NAA: NOT DETECTED

## 2018-07-04 ENCOUNTER — Encounter: Payer: Self-pay | Admitting: Internal Medicine

## 2018-07-04 ENCOUNTER — Ambulatory Visit (INDEPENDENT_AMBULATORY_CARE_PROVIDER_SITE_OTHER): Payer: Medicare Other | Admitting: Internal Medicine

## 2018-07-04 ENCOUNTER — Ambulatory Visit
Admission: RE | Admit: 2018-07-04 | Discharge: 2018-07-04 | Disposition: A | Discharge status: Home / Self Care | Payer: Medicare Other | Source: Ambulatory Visit | Attending: Internal Medicine | Admitting: Internal Medicine

## 2018-07-04 ENCOUNTER — Other Ambulatory Visit: Payer: Self-pay

## 2018-07-04 VITALS — BP 100/60 | HR 91 | Temp 97.8°F | Ht 69.0 in | Wt 185.0 lb

## 2018-07-04 DIAGNOSIS — R4586 Emotional lability: Secondary | ICD-10-CM

## 2018-07-04 DIAGNOSIS — R0602 Shortness of breath: Secondary | ICD-10-CM | POA: Diagnosis not present

## 2018-07-04 DIAGNOSIS — R05 Cough: Secondary | ICD-10-CM | POA: Diagnosis not present

## 2018-07-04 DIAGNOSIS — R059 Cough, unspecified: Secondary | ICD-10-CM

## 2018-07-04 DIAGNOSIS — R5383 Other fatigue: Secondary | ICD-10-CM | POA: Diagnosis not present

## 2018-07-04 DIAGNOSIS — R053 Chronic cough: Secondary | ICD-10-CM

## 2018-07-04 MED ORDER — BUPROPION HCL ER (XL) 300 MG PO TB24: 300.0000 mg | ORAL_TABLET | Freq: Every day | ORAL | 1 refills | Status: DC
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
## 2018-07-05 NOTE — Patient Instructions (Addendum)
Chest x-ray ordered.  Doxycycline 100 mg twice daily for 10 days.  Labs drawn and pending including TSH, CBC, hepatitis B panel.  Increase Wellbutrin.

## 2018-07-07 LAB — COMPLETE METABOLIC PANEL WITH GFR
AG Ratio: 0.8 (calc) — ABNORMAL LOW (ref 1.0–2.5)
ALT: 50 U/L — ABNORMAL HIGH (ref 9–46)
AST: 73 U/L — ABNORMAL HIGH (ref 10–35)
Albumin: 3 g/dL — ABNORMAL LOW (ref 3.6–5.1)
Alkaline phosphatase (APISO): 200 U/L — ABNORMAL HIGH (ref 35–144)
BUN: 16 mg/dL (ref 7–25)
CO2: 26 mmol/L (ref 20–32)
Calcium: 8.9 mg/dL (ref 8.6–10.3)
Chloride: 103 mmol/L (ref 98–110)
Creat: 0.83 mg/dL (ref 0.70–1.18)
GFR, Est African American: 98 mL/min/{1.73_m2} (ref >=60)
GFR, Est Non African American: 84 mL/min/{1.73_m2} (ref >=60)
Globulin: 3.7 g/dL (calc) (ref 1.9–3.7)
Glucose, Bld: 98 mg/dL (ref 65–99)
Potassium: 4.5 mmol/L (ref 3.5–5.3)
Sodium: 136 mmol/L (ref 135–146)
Total Bilirubin: 0.8 mg/dL (ref 0.2–1.2)
Total Protein: 6.7 g/dL (ref 6.1–8.1)

## 2018-07-07 LAB — HEPATITIS B SURFACE ANTIBODY,QUALITATIVE: Hep B S Ab: NONREACTIVE

## 2018-07-07 LAB — HEPATITIS B CORE ANTIBODY, TOTAL: Hep B Core Total Ab: NONREACTIVE

## 2018-07-07 LAB — CBC WITH DIFFERENTIAL/PLATELET
Absolute Monocytes: 974 cells/uL — ABNORMAL HIGH (ref 200–950)
Basophils Absolute: 28 cells/uL (ref 0–200)
Basophils Relative: 0.5 %
Eosinophils Absolute: 62 cells/uL (ref 15–500)
Eosinophils Relative: 1.1 %
HCT: 36.8 % — ABNORMAL LOW (ref 38.5–50.0)
Hemoglobin: 12.1 g/dL — ABNORMAL LOW (ref 13.2–17.1)
Lymphs Abs: 1210 cells/uL (ref 850–3900)
MCH: 27.5 pg (ref 27.0–33.0)
MCHC: 32.9 g/dL (ref 32.0–36.0)
MCV: 83.6 fL (ref 80.0–100.0)
MPV: 9.1 fL (ref 7.5–12.5)
Monocytes Relative: 17.4 %
Neutro Abs: 3326 cells/uL (ref 1500–7800)
Neutrophils Relative %: 59.4 %
Platelets: 304 10*3/uL (ref 140–400)
RBC: 4.4 10*6/uL (ref 4.20–5.80)
RDW: 13.8 % (ref 11.0–15.0)
Total Lymphocyte: 21.6 %
WBC: 5.6 10*3/uL (ref 3.8–10.8)

## 2018-07-07 LAB — HEPATITIS B SURFACE ANTIGEN: Hepatitis B Surface Ag: NONREACTIVE

## 2018-07-07 LAB — HEPATITIS B E ANTIGEN: Hep B E Ag: NONREACTIVE

## 2018-07-07 LAB — TSH: TSH: 0.62 mIU/L (ref 0.40–4.50)

## 2018-07-18 ENCOUNTER — Inpatient Hospital Stay (HOSPITAL_COMMUNITY)
Admission: EM | Admit: 2018-07-18 | Discharge: 2018-07-22 | DRG: 176 | Disposition: A | Discharge status: Home Health | Payer: Medicare Other | Attending: Internal Medicine | Admitting: Internal Medicine

## 2018-07-18 ENCOUNTER — Encounter (HOSPITAL_COMMUNITY): Payer: Self-pay | Admitting: Emergency Medicine

## 2018-07-18 ENCOUNTER — Emergency Department (HOSPITAL_COMMUNITY): Payer: Medicare Other

## 2018-07-18 ENCOUNTER — Ambulatory Visit (INDEPENDENT_AMBULATORY_CARE_PROVIDER_SITE_OTHER): Payer: Medicare Other | Admitting: Internal Medicine

## 2018-07-18 ENCOUNTER — Encounter: Payer: Self-pay | Admitting: Internal Medicine

## 2018-07-18 ENCOUNTER — Other Ambulatory Visit: Payer: Self-pay

## 2018-07-18 VITALS — BP 100/60 | HR 99 | Ht 69.0 in | Wt 181.0 lb

## 2018-07-18 DIAGNOSIS — Z86711 Personal history of pulmonary embolism: Secondary | ICD-10-CM | POA: Diagnosis not present

## 2018-07-18 DIAGNOSIS — Z791 Long term (current) use of non-steroidal anti-inflammatories (NSAID): Secondary | ICD-10-CM | POA: Diagnosis not present

## 2018-07-18 DIAGNOSIS — R7401 Elevation of levels of liver transaminase levels: Secondary | ICD-10-CM | POA: Diagnosis present

## 2018-07-18 DIAGNOSIS — R748 Abnormal levels of other serum enzymes: Secondary | ICD-10-CM

## 2018-07-18 DIAGNOSIS — Z886 Allergy status to analgesic agent status: Secondary | ICD-10-CM

## 2018-07-18 DIAGNOSIS — Z87891 Personal history of nicotine dependence: Secondary | ICD-10-CM | POA: Diagnosis not present

## 2018-07-18 DIAGNOSIS — I2699 Other pulmonary embolism without acute cor pulmonale: Secondary | ICD-10-CM

## 2018-07-18 DIAGNOSIS — R531 Weakness: Secondary | ICD-10-CM | POA: Diagnosis present

## 2018-07-18 DIAGNOSIS — R0602 Shortness of breath: Secondary | ICD-10-CM | POA: Diagnosis not present

## 2018-07-18 DIAGNOSIS — Z7982 Long term (current) use of aspirin: Secondary | ICD-10-CM

## 2018-07-18 DIAGNOSIS — Z8249 Family history of ischemic heart disease and other diseases of the circulatory system: Secondary | ICD-10-CM | POA: Diagnosis not present

## 2018-07-18 DIAGNOSIS — K7689 Other specified diseases of liver: Secondary | ICD-10-CM | POA: Diagnosis present

## 2018-07-18 DIAGNOSIS — N1330 Unspecified hydronephrosis: Secondary | ICD-10-CM | POA: Diagnosis present

## 2018-07-18 DIAGNOSIS — F329 Major depressive disorder, single episode, unspecified: Secondary | ICD-10-CM | POA: Diagnosis not present

## 2018-07-18 DIAGNOSIS — I2510 Atherosclerotic heart disease of native coronary artery without angina pectoris: Secondary | ICD-10-CM | POA: Diagnosis not present

## 2018-07-18 DIAGNOSIS — I824Y1 Acute embolism and thrombosis of unspecified deep veins of right proximal lower extremity: Secondary | ICD-10-CM | POA: Diagnosis not present

## 2018-07-18 DIAGNOSIS — R634 Abnormal weight loss: Secondary | ICD-10-CM | POA: Diagnosis not present

## 2018-07-18 DIAGNOSIS — Z8781 Personal history of (healed) traumatic fracture: Secondary | ICD-10-CM

## 2018-07-18 DIAGNOSIS — D1779 Benign lipomatous neoplasm of other sites: Secondary | ICD-10-CM | POA: Diagnosis present

## 2018-07-18 DIAGNOSIS — F338 Other recurrent depressive disorders: Secondary | ICD-10-CM | POA: Diagnosis present

## 2018-07-18 DIAGNOSIS — R0902 Hypoxemia: Secondary | ICD-10-CM | POA: Diagnosis not present

## 2018-07-18 DIAGNOSIS — Z1159 Encounter for screening for other viral diseases: Secondary | ICD-10-CM

## 2018-07-18 DIAGNOSIS — Z881 Allergy status to other antibiotic agents status: Secondary | ICD-10-CM

## 2018-07-18 DIAGNOSIS — I82462 Acute embolism and thrombosis of left calf muscular vein: Secondary | ICD-10-CM | POA: Diagnosis present

## 2018-07-18 DIAGNOSIS — N201 Calculus of ureter: Secondary | ICD-10-CM

## 2018-07-18 DIAGNOSIS — I10 Essential (primary) hypertension: Secondary | ICD-10-CM | POA: Diagnosis not present

## 2018-07-18 DIAGNOSIS — I82413 Acute embolism and thrombosis of femoral vein, bilateral: Secondary | ICD-10-CM | POA: Diagnosis not present

## 2018-07-18 DIAGNOSIS — F32A Depression, unspecified: Secondary | ICD-10-CM | POA: Diagnosis present

## 2018-07-18 DIAGNOSIS — R740 Nonspecific elevation of levels of transaminase and lactic acid dehydrogenase [LDH]: Secondary | ICD-10-CM | POA: Diagnosis not present

## 2018-07-18 DIAGNOSIS — E559 Vitamin D deficiency, unspecified: Secondary | ICD-10-CM | POA: Diagnosis not present

## 2018-07-18 DIAGNOSIS — Z79891 Long term (current) use of opiate analgesic: Secondary | ICD-10-CM | POA: Diagnosis not present

## 2018-07-18 DIAGNOSIS — Z79899 Other long term (current) drug therapy: Secondary | ICD-10-CM

## 2018-07-18 DIAGNOSIS — N132 Hydronephrosis with renal and ureteral calculous obstruction: Secondary | ICD-10-CM | POA: Diagnosis present

## 2018-07-18 DIAGNOSIS — I82403 Acute embolism and thrombosis of unspecified deep veins of lower extremity, bilateral: Secondary | ICD-10-CM | POA: Diagnosis not present

## 2018-07-18 DIAGNOSIS — N400 Enlarged prostate without lower urinary tract symptoms: Secondary | ICD-10-CM | POA: Diagnosis present

## 2018-07-18 DIAGNOSIS — E785 Hyperlipidemia, unspecified: Secondary | ICD-10-CM | POA: Diagnosis not present

## 2018-07-18 LAB — URINALYSIS, ROUTINE W REFLEX MICROSCOPIC
Bacteria, UA: NONE SEEN
Bilirubin Urine: NEGATIVE
Glucose, UA: NEGATIVE mg/dL
Ketones, ur: 20 mg/dL — AB
Leukocytes,Ua: NEGATIVE
Nitrite: NEGATIVE
Protein, ur: 30 mg/dL — AB
Specific Gravity, Urine: 1.017 (ref 1.005–1.030)
pH: 6 (ref 5.0–8.0)

## 2018-07-18 LAB — CBC WITH DIFFERENTIAL/PLATELET
Abs Immature Granulocytes: 0.03 10*3/uL (ref 0.00–0.07)
Basophils Absolute: 0 10*3/uL (ref 0.0–0.1)
Basophils Relative: 0 %
Eosinophils Absolute: 0 10*3/uL (ref 0.0–0.5)
Eosinophils Relative: 0 %
HCT: 37.6 % — ABNORMAL LOW (ref 39.0–52.0)
Hemoglobin: 11.6 g/dL — ABNORMAL LOW (ref 13.0–17.0)
Immature Granulocytes: 1 %
Lymphocytes Relative: 14 %
Lymphs Abs: 0.6 10*3/uL — ABNORMAL LOW (ref 0.7–4.0)
MCH: 26.8 pg (ref 26.0–34.0)
MCHC: 30.9 g/dL (ref 30.0–36.0)
MCV: 86.8 fL (ref 80.0–100.0)
Monocytes Absolute: 0.7 10*3/uL (ref 0.1–1.0)
Monocytes Relative: 15 %
Neutro Abs: 3.2 10*3/uL (ref 1.7–7.7)
Neutrophils Relative %: 70 %
Platelets: 217 10*3/uL (ref 150–400)
RBC: 4.33 MIL/uL (ref 4.22–5.81)
RDW: 15.3 % (ref 11.5–15.5)
WBC: 4.6 10*3/uL (ref 4.0–10.5)
nRBC: 0 % (ref 0.0–0.2)

## 2018-07-18 LAB — COMPREHENSIVE METABOLIC PANEL
ALT: 51 U/L — ABNORMAL HIGH (ref 0–44)
AST: 87 U/L — ABNORMAL HIGH (ref 15–41)
Albumin: 2.4 g/dL — ABNORMAL LOW (ref 3.5–5.0)
Alkaline Phosphatase: 227 U/L — ABNORMAL HIGH (ref 38–126)
Anion gap: 9 (ref 5–15)
BUN: 18 mg/dL (ref 8–23)
CO2: 23 mmol/L (ref 22–32)
Calcium: 8.3 mg/dL — ABNORMAL LOW (ref 8.9–10.3)
Chloride: 100 mmol/L (ref 98–111)
Creatinine, Ser: 0.8 mg/dL (ref 0.61–1.24)
GFR calc Af Amer: >60 mL/min (ref >60)
GFR calc non Af Amer: >60 mL/min (ref >60)
Glucose, Bld: 85 mg/dL (ref 70–99)
Potassium: 3.5 mmol/L (ref 3.5–5.1)
Sodium: 132 mmol/L — ABNORMAL LOW (ref 135–145)
Total Bilirubin: 1.2 mg/dL (ref 0.3–1.2)
Total Protein: 6.9 g/dL (ref 6.5–8.1)

## 2018-07-18 LAB — URINALYSIS, COMPLETE (UACMP) WITH MICROSCOPIC
Bacteria, UA: NONE SEEN
Bilirubin Urine: NEGATIVE
Glucose, UA: NEGATIVE mg/dL
Ketones, ur: 5 mg/dL — AB
Leukocytes,Ua: NEGATIVE
Nitrite: NEGATIVE
Protein, ur: NEGATIVE mg/dL
RBC / HPF: >50 RBC/hpf — ABNORMAL HIGH (ref 0–5)
Specific Gravity, Urine: 1.038 — ABNORMAL HIGH (ref 1.005–1.030)
pH: 6 (ref 5.0–8.0)

## 2018-07-18 LAB — MAGNESIUM: Magnesium: 2.1 mg/dL (ref 1.7–2.4)

## 2018-07-18 LAB — ANTITHROMBIN III: AntiThromb III Func: 66 % — ABNORMAL LOW (ref 75–120)

## 2018-07-18 LAB — LIPASE, BLOOD: Lipase: 25 U/L (ref 11–51)

## 2018-07-18 LAB — TROPONIN I: Troponin I: <0.03 ng/mL (ref <0.03)

## 2018-07-18 MED ORDER — TRAMADOL HCL 50 MG PO TABS: 50.0000 mg | ORAL_TABLET | Freq: Four times a day (QID) | ORAL | Status: DC | PRN

## 2018-07-18 MED ORDER — HEPARIN (PORCINE) 25000 UT/250ML-% IV SOLN
1300.0000 [IU]/h | INTRAVENOUS | Status: DC
  Administered 2018-07-18: 1300 [IU]/h via INTRAVENOUS
  Filled 2018-07-18: qty 250

## 2018-07-18 MED ORDER — ADULT MULTIVITAMIN W/MINERALS CH
1.0000 | ORAL_TABLET | Freq: Every day | ORAL | Status: DC
  Administered 2018-07-19 – 2018-07-22 (×4): 1 via ORAL
  Filled 2018-07-18 (×4): qty 1

## 2018-07-18 MED ORDER — MAGNESIUM CITRATE PO SOLN: 1.0000 | Freq: Once | ORAL | Status: DC | PRN

## 2018-07-18 MED ORDER — ALBUTEROL SULFATE (2.5 MG/3ML) 0.083% IN NEBU: 2.5000 mg | INHALATION_SOLUTION | RESPIRATORY_TRACT | Status: DC | PRN

## 2018-07-18 MED ORDER — SODIUM CHLORIDE 0.9 % IV SOLN
INTRAVENOUS | Status: DC
  Administered 2018-07-18 – 2018-07-20 (×4): via INTRAVENOUS

## 2018-07-18 MED ORDER — SIMVASTATIN 40 MG PO TABS: 40.0000 mg | ORAL_TABLET | Freq: Every day | ORAL | Status: DC

## 2018-07-18 MED ORDER — HEPARIN BOLUS VIA INFUSION
2500.0000 [IU] | Freq: Once | INTRAVENOUS | Status: AC
  Administered 2018-07-18: 2500 [IU] via INTRAVENOUS
  Filled 2018-07-18: qty 2500

## 2018-07-18 MED ORDER — ONDANSETRON HCL 4 MG PO TABS: 4.0000 mg | ORAL_TABLET | Freq: Four times a day (QID) | ORAL | Status: DC | PRN

## 2018-07-18 MED ORDER — SODIUM CHLORIDE 0.9% FLUSH
3.0000 mL | Freq: Two times a day (BID) | INTRAVENOUS | Status: DC
  Administered 2018-07-18 – 2018-07-19 (×2): 3 mL via INTRAVENOUS
  Administered 2018-07-21: 4 mL via INTRAVENOUS
  Administered 2018-07-21 – 2018-07-22 (×2): 3 mL via INTRAVENOUS

## 2018-07-18 MED ORDER — ACETAMINOPHEN 650 MG RE SUPP: 650.0000 mg | Freq: Four times a day (QID) | RECTAL | Status: DC | PRN

## 2018-07-18 MED ORDER — ACETAMINOPHEN 325 MG PO TABS: 650.0000 mg | ORAL_TABLET | Freq: Four times a day (QID) | ORAL | Status: DC | PRN

## 2018-07-18 MED ORDER — SENNA 8.6 MG PO TABS
1.0000 | ORAL_TABLET | Freq: Two times a day (BID) | ORAL | Status: DC
  Administered 2018-07-18 – 2018-07-22 (×6): 8.6 mg via ORAL
  Filled 2018-07-18 (×8): qty 1

## 2018-07-18 MED ORDER — IOHEXOL 350 MG/ML SOLN
100.0000 mL | Freq: Once | INTRAVENOUS | Status: AC | PRN
  Administered 2018-07-18: 100 mL via INTRAVENOUS

## 2018-07-18 MED ORDER — BUPROPION HCL ER (XL) 300 MG PO TB24
300.0000 mg | ORAL_TABLET | Freq: Every day | ORAL | Status: DC
  Administered 2018-07-19 – 2018-07-22 (×4): 300 mg via ORAL
  Filled 2018-07-18 (×4): qty 1

## 2018-07-18 MED ORDER — SODIUM CHLORIDE (PF) 0.9 % IJ SOLN
INTRAMUSCULAR | Status: AC
  Filled 2018-07-18: qty 50

## 2018-07-18 MED ORDER — SORBITOL 70 % SOLN: 30.0000 mL | Freq: Every day | Status: DC | PRN

## 2018-07-18 MED ORDER — ONDANSETRON HCL 4 MG/2ML IJ SOLN: 4.0000 mg | Freq: Four times a day (QID) | INTRAMUSCULAR | Status: DC | PRN

## 2018-07-18 MED ORDER — POLYETHYLENE GLYCOL 3350 17 G PO PACK: 17.0000 g | PACK | Freq: Every day | ORAL | Status: DC | PRN

## 2018-07-18 NOTE — ED Notes (Signed)
Attempted to call the floor.

## 2018-07-18 NOTE — Progress Notes (Signed)
   Subjective:    Patient ID: Luis Cisneros, male    DOB: [birth year], 78 y.o.   MRN: [id number]  HPI Here today with wife. Has noted improvement with cough after Doxycycline but has 16 pound weight loss since October. Has generalized weakness. At last visit May 29th had  elevations of SGOT, alkaline phosphatase and SGPT. CXR then was negative. No history of alcohol or analgesic abuse. Is tremulous today in office. Pulse ox ranging from 89 to 92 %. EKG shows no acute changes.Seems fatigued and SOB when speaking. He is hard of hearing. Had negative Covid test May 26th. TSH was WNL May 29th. Hgb was 12.1 grams and had been 14.1 grams in October.    Review of Systems lack of appetitie. Wife fixes smoothies     Objective:   Physical Exam He is tachypneic.  Pulse 100 and regular.  Pulse oximetry 92% resting on exam table.  Drops to 89% He seems to have decreased breath sounds in both lower lobes.  Abdomen is scaphoid.  No hepatosplenomegaly or masses appreciated.  Stool was guaiac negative at last visit May 29.  No lower extremity edema.  He is hard of hearing.      Assessment & Plan:  Unexplained 16 pound weight loss since October 2019  Hypoxia  Tachycardia  Mild elevations of alkaline phosphatase, SGOT and SGPT.  He looks much weaker since last visit May 29.  I have recommended he proceed to [facility] long emergency department for evaluation including CT scan of lungs and possibly abdomen in view of elevated LFTs

## 2018-07-18 NOTE — Addendum Note (Signed)
Addended by: Mady Haagensen on: 07/18/2018 02:10 PM   Modules accepted: Orders

## 2018-07-18 NOTE — ED Provider Notes (Signed)
[facility] DEPT Provider Note   CSN: [id number] Arrival date & time: 07/18/18  1249     History   Chief Complaint Chief Complaint  Patient presents with   Extremity Weakness    HPI Luis Cisneros is a 78 y.o. male.     HPI   78yM with fatigue. Onset about a month ago. "I had all the symptoms of COVID" but tested negative. Was prescribed abx because he was having respiratory symptoms and says he breathing has generally improved but continues to feel very fatigued. Feels weak in his b/l thighs with exertion. He provided the example that after he comes downstairs after making coffee in the morning that he has to stop and rest 2-3 times when walking back up the stairs. No CP or acute pain otherwise. No hx of CAD that he is aware of.   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   Arthritis, hip 04/28/2014   Erectile dysfunction 11/04/2011   History of pulmonary embolism 11/04/2011   Family history of aortic aneurysm 11/04/2011   Hyperlipidemia 11/01/2010   Seasonal affective disorder ([place]) 11/01/2010   ROTATOR CUFF TEAR 01/11/2009   SHOULDER PAIN, RIGHT 12/14/2008   ROTATOR CUFF SYNDROME, RIGHT 12/14/2008   KNEE PAIN, RIGHT 04/30/2007   OSTEOARTHROSIS, LOCAL NOS, PELVIS/THIGH 07/10/2006    Past Surgical History:  Procedure Laterality Date   TONSILLECTOMY     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   dr Mayer Camel   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   Procedure: TOTAL HIP ARTHROPLASTY;  Surgeon: Frederik Pear, MD;  Location: [facility];  Service: Orthopedics;  Laterality: Left;        Home Medications     Prior to Admission medications   Medication Sig Start Date End Date Taking? Authorizing Provider  amLODipine (NORVASC) 5 MG tablet TAKE 1 TABLET BY MOUTH EVERY DAY 12/04/17   Elby Showers, MD  aspirin 81 MG tablet Take 81 mg by mouth daily.      [provider]  buPROPion (WELLBUTRIN XL) 300 MG 24 hr tablet Take 1 tablet (300 mg total) by mouth daily. 07/04/18   Elby Showers, MD  Cholecalciferol (VITAMIN D-3 PO) Take by mouth.      [provider]  meloxicam (MOBIC) 15 MG tablet TAKE 1 TABLET BY MOUTH EVERY DAY 06/02/18   Stefanie Libel, MD  Multiple Vitamin (MULTIVITAMIN) tablet Take 1 tablet by mouth daily.      [provider]  simvastatin (ZOCOR) 40 MG tablet TAKE 1 TABLET BY MOUTH DAILY 04/10/18   Elby Showers, MD  traMADol (ULTRAM) 50 MG tablet Take 1 tablet (50 mg total) by mouth every 6 (six) hours as needed. 12/19/17   Stefanie Libel, MD    Family History Family History  Problem Relation Age of Onset   Aortic aneurysm Mother    Heart disease Father     Social History Social History   Tobacco Use   Smoking status: Former Smoker    Packs/day: 1.00    Types: Cigarettes    Quit date: 02/06/1987    Years  since quitting: 31.4   Smokeless tobacco: Never Used  Substance Use Topics   Alcohol use: Yes    Comment: 1 drink  4-5 days a week   Drug use: No     Allergies   Cephalexin and Feldene [piroxicam]   Review of Systems Review of Systems  All systems reviewed and negative, other than as noted in HPI.  Physical Exam Updated Vital Signs BP 120/69    Pulse 93    Temp 98.3 F (36.8 C) (Oral)    Resp 18    SpO2 92%   Physical Exam Vitals signs and nursing note reviewed.  Constitutional:      General: He is not in acute distress.    Appearance: He is well-developed.  HENT:     Head: Normocephalic and atraumatic.  Eyes:     General:        Right eye: No discharge.        Left eye: No discharge.     Conjunctiva/sclera:  Conjunctivae normal.  Neck:     Musculoskeletal: Neck supple.  Cardiovascular:     Rate and Rhythm: Normal rate and regular rhythm.     Heart sounds: Normal heart sounds. No murmur. No friction rub. No gallop.   Pulmonary:     Effort: Pulmonary effort is normal. No respiratory distress.     Breath sounds: Normal breath sounds.  Abdominal:     General: There is no distension.     Palpations: Abdomen is soft.     Tenderness: There is no abdominal tenderness.  Musculoskeletal:        General: No tenderness.  Skin:    General: Skin is warm and dry.  Neurological:     Mental Status: He is alert.  Psychiatric:        Behavior: Behavior normal.        Thought Content: Thought content normal.      ED Treatments / Results  Labs (all labs ordered are listed, but only abnormal results are displayed) Labs Reviewed  COMPREHENSIVE METABOLIC PANEL - Abnormal; Notable for the following components:      Result Value   Sodium 132 (*)    Calcium 8.3 (*)    Albumin 2.4 (*)    AST 87 (*)    ALT 51 (*)    Alkaline Phosphatase 227 (*)    All other components within normal limits  URINALYSIS, ROUTINE W REFLEX MICROSCOPIC - Abnormal; Notable for the following components:   Color, Urine AMBER (*)    Hgb urine dipstick SMALL (*)    Ketones, ur 20 (*)    Protein, ur 30 (*)    All other components within normal limits  CBC WITH DIFFERENTIAL/PLATELET - Abnormal; Notable for the following components:   Hemoglobin 11.6 (*)    HCT 37.6 (*)    Lymphs Abs 0.6 (*)    All other components within normal limits  NOVEL CORONAVIRUS, NAA (HOSPITAL ORDER, SEND-OUT TO REF LAB)  LIPASE, BLOOD  ANTITHROMBIN III  PROTEIN C ACTIVITY  PROTEIN C, TOTAL  PROTEIN S ACTIVITY  PROTEIN S, TOTAL  LUPUS ANTICOAGULANT PANEL  BETA-2-GLYCOPROTEIN I ABS, IGG/M/A  HOMOCYSTEINE  FACTOR 5 LEIDEN  PROTHROMBIN GENE MUTATION  CARDIOLIPIN ANTIBODIES, IGG, IGM, IGA  HEPARIN LEVEL (UNFRACTIONATED)    EKG EKG  Interpretation  Date/Time:  Friday July 18 2018 14:05:40 EDT Ventricular Rate:  93 PR Interval:    QRS Duration: 99 QT Interval:  372 QTC Calculation: 463 R Axis:  38 Text Interpretation:  Sinus rhythm Low voltage, precordial leads Borderline T abnormalities, anterior leads Confirmed by Virgel Manifold [phone]) on 07/18/2018 2:38:25 PM   Radiology Ct Angio Chest Pe W And/or Wo Contrast  Result Date: 07/18/2018 CLINICAL DATA:  Leg weakness with exertion EXAM: CT ANGIOGRAPHY CHEST WITH CONTRAST TECHNIQUE: Multidetector CT imaging of the chest was performed using the standard protocol during bolus administration of intravenous contrast. Multiplanar CT image reconstructions and MIPs were obtained to evaluate the vascular anatomy. CONTRAST:  178mL OMNIPAQUE IOHEXOL 350 MG/ML SOLN COMPARISON:  11/17/2009 FINDINGS: Cardiovascular: Examination for pulmonary embolism is limited by patient and breath motion artifact throughout. Within this limitation, positive examination for pulmonary embolism, with segmental embolus present in the left lung base (series 6, image 53). There is no definite embolus identified elsewhere. The RV:LV ratio is enlarged, measuring up to 1.5:1. Left coronary artery calcifications. No pericardial effusion. Mediastinum/Nodes: No enlarged mediastinal, hilar, or axillary lymph nodes. Thyroid gland, trachea, and esophagus demonstrate no significant findings. Lungs/Pleura: There is a subpleural heterogeneous opacity of the left lung base distal to embolus, concerning for developing infarction. No pleural effusion or pneumothorax. Upper Abdomen: No acute abnormality. Musculoskeletal: No chest wall abnormality. No acute or significant osseous findings. Review of the MIP images confirms the above findings. IMPRESSION: 1. Examination for pulmonary embolism is limited by patient and breath motion artifact throughout. Within this limitation, positive examination for pulmonary embolism, with  segmental embolus present in the left lung base (series 6, image 53). There is no definite embolus identified elsewhere. 2. There is a subpleural heterogeneous opacity of the left lung base distal to embolus, concerning for developing infarction. 3. The RV:LV ratio is enlarged, measuring up to 1.5:1. This is concerning for right heart strain by criteria in the setting of pulmonary embolism, however there is a relatively small, distal burden of embolus. Correlate with clinical concern for right heart strain and echocardiogram. 4.  Coronary artery disease. These results were called by telephone at the time of interpretation on 07/18/2018 at 3:33 pm to Dr. Virgel Manifold , who verbally acknowledged these results. Electronically Signed   By: Eddie Candle M.D.   On: 07/18/2018 15:36   Ct Abdomen Pelvis W Contrast  Result Date: 07/18/2018 CLINICAL DATA:  Painless jaundice, weight loss. EXAM: CT ABDOMEN AND PELVIS WITH CONTRAST TECHNIQUE: Multidetector CT imaging of the abdomen and pelvis was performed using the standard protocol following bolus administration of intravenous contrast. CONTRAST:  153mL OMNIPAQUE IOHEXOL 350 MG/ML SOLN COMPARISON:  CT scan of November 17, 2009. FINDINGS: Lower chest: No acute abnormality. Hepatobiliary: No cholelithiasis or biliary dilatation is noted. Simple left hepatic cyst is noted. Pancreas: Unremarkable. No pancreatic ductal dilatation or surrounding inflammatory changes. Spleen: Normal in size without focal abnormality. Adrenals/Urinary Tract: Left adrenal gland appears normal. Stable calcified right adrenal myelolipoma is noted. Small nonobstructive calculus seen in upper pole calyx of left kidney. Mild left hydronephrosis is noted secondary to 7 mm calculus in proximal left ureter. Urinary bladder is unremarkable. Stomach/Bowel: The stomach appears normal. There is no evidence of bowel obstruction or inflammation. The appendix is not visualized. Vascular/Lymphatic: Aortic  atherosclerosis. No enlarged abdominal or pelvic lymph nodes. Reproductive: Mild prostatic enlargement is noted. Other: No abdominal wall hernia or abnormality. No abdominopelvic ascites. Musculoskeletal: Status post left total hip arthroplasty. No acute osseous abnormality is noted. IMPRESSION: Mild left hydronephrosis is noted secondary to 7 mm proximal left ureteral calculus. Stable partially calcified right adrenal myelolipoma is noted. Mild prostatic enlargement.  Aortic Atherosclerosis (ICD10-I70.0). Electronically Signed   By: Marijo Conception M.D.   On: 07/18/2018 15:51    Procedures Procedures (including critical care time)  CRITICAL CARE Performed by: Virgel Manifold Total critical care time: 35 minutes Critical care time was exclusive of separately billable procedures and treating other patients. Critical care was necessary to treat or prevent imminent or life-threatening deterioration. Critical care was time spent personally by me on the following activities: development of treatment plan with patient and/or surrogate as well as nursing, discussions with consultants, evaluation of patient's response to treatment, examination of patient, obtaining history from patient or surrogate, ordering and performing treatments and interventions, ordering and review of laboratory studies, ordering and review of radiographic studies, pulse oximetry and re-evaluation of patient's condition.   Medications Ordered in ED Medications  0.9 %  sodium chloride infusion ( Intravenous New Bag/Given 07/18/18 1353)  sodium chloride (PF) 0.9 % injection (has no administration in time range)  iohexol (OMNIPAQUE) 350 MG/ML injection 100 mL (100 mLs Intravenous Contrast Given 07/18/18 1507)     Initial Impression / Assessment and Plan / ED Course  I have reviewed the triage vital signs and the nursing notes.  Pertinent labs & imaging results that were available during my care of the patient were reviewed by me and  considered in my medical decision making (see chart for details).   78yM with generalized weakness/fatigue for past month or so. CTa significant for PE. Reports hx of PE years ago associated with orthopedic injury that was treated with lovenox/coumadin. He has not been anticoagulated since that time though. Will place on heparin initially and can discuss further treatment options with admitting team.   Also noted to have L ureteral stone. When questioning him concerning possible symptoms he does say that he has been having some mild intermittent L sided abdominal/flank pain for the past week or so. Stone 7 mm but no pain currently. Renal function fine.    Final Clinical Impressions(s) / ED Diagnoses   Final diagnoses:  Acute pulmonary embolism, unspecified pulmonary embolism type, unspecified whether acute cor pulmonale present [facility])  Left ureteral stone    ED Discharge Orders    None       Virgel Manifold, MD 07/18/18 1625

## 2018-07-18 NOTE — Patient Instructions (Signed)
Proceeded to [facility] long ER for evaluation of tachycardia and hypoxia with 16 pound weight loss since October and elevated liver functions.

## 2018-07-18 NOTE — ED Triage Notes (Signed)
Pt reports for three weeks had leg weakness with stair climbing, going up a hill, "anything vertical". Reports no appetite. Cough gotten better.

## 2018-07-18 NOTE — H&P (Signed)
History and Physical    Luis Cisneros GGY:[id number] DOB: [birth year] DOA: 07/18/2018  PCP: Elby Showers, MD  Patient coming from: Home  I have personally briefly reviewed patient's old medical records in [place]  Chief Complaint: Worsening shortness of breath/fatigue  HPI: Luis Cisneros is a 78 y.o. male with medical history significant of prior pulmonary embolism diagnosed in 2006 after an ankle fracture status post 6 months of anticoagulation, hyperlipidemia, hypertension, vitamin D deficiency, depression presenting to the ED with a one-month history of shortness of breath, fatigue, decreased appetite.  Per wife patient shortness of breath has worsened over the past 2 weeks.  Patient stated symptoms have been in the process of being evaluated per his PCP and had a COVID-19 test which was done which was negative.  Patient denies any fevers, no chills, no chest pain, no diarrhea, no syncope, no cough, no nausea, no vomiting, no dysuria, no melena, no hematemesis, no hematochezia.  Patient does endorse decreased oral intake, decreased appetite, increased fatigue, some recent dizzy spells, worsening shortness of breath over the past 2 weeks per wife.  Patient also endorses weakness in his bilateral thighs.  Patient did state that for example he comes down early in the mornings to make coffee for him and his wife and on his way back upstairs has to stop 2-3 times due to shortness of breath on exertion and weakness.  Patient also with some complaints of left groin pain which has been intermittent in nature and currently asymptomatic.  ED Course: Patient seen in the ED noted to have sats of 85% on room air.  Patient also noted to have a blood pressure of 100/60 on presentation to the ED with a respiratory rate of 21 and a heart rate ranging from 91-100.  Comprehensive metabolic profile obtained with a sodium of 132 calcium of 8.3, albumin of 2.4, AST of 87, ALT of 51 otherwise was within normal  limits.  CBC with a hemoglobin of 11.6 otherwise was within normal limits.  SARS COVID-19 was ordered and currently pending.  Chest x-ray done was unremarkable.  CT abdomen and pelvis done with mild left hydronephrosis with a 7 mm proximal left ureteral calculus, mild prostatic enlargement, stable partially calcified right adrenal myelolipoma.  CT angiogram chest limited by breath motion artifact throughout however did show positive pulmonary embolism with segmental embolus present in the left lung base.  Subpleural heterogeneous opacity of the left lung base distal to embolus concerning for developing infarction.  RV to LV ratio enlarged measuring 1.5-1 concerning for right heart strain.  Coronary artery disease.  Hypercoagulable panel seems to have been ordered per ED.  IV heparin ordered however not given at the time of my interview.  Review of Systems: As per HPI otherwise 10 point review of systems negative.   Past Medical History:  Diagnosis Date   Ankle fracture 2006   right   Arthritis    Depression    Hyperlipidemia    Hypertension    Pulmonary embolism ([place])    Vitamin D deficiency     Past Surgical History:  Procedure Laterality Date   TONSILLECTOMY     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   dr Mayer Camel   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   Procedure: TOTAL HIP ARTHROPLASTY;  Surgeon: Frederik Pear, MD;  Location: [facility];  Service: Orthopedics;  Laterality: Left;     reports that he quit smoking about 31 years ago. His smoking use included cigarettes.  He smoked 1.00 pack per day. He has never used smokeless tobacco. He reports current alcohol use. He reports that he does not use drugs.  Allergies  Allergen Reactions   Cephalexin    Feldene [Piroxicam] Nausea Only and Other (See Comments)    Makes pt feel loopy    Family History  Problem Relation Age of Onset   Aortic aneurysm Mother    Heart disease Father    Mother deceased age 71 from a AAA.  Father  deceased age 21 with probable cardiovascular disease as per patient had been bedridden for 4 years.  Prior to Admission medications   Medication Sig Start Date End Date Taking? Authorizing Provider  amLODipine (NORVASC) 5 MG tablet TAKE 1 TABLET BY MOUTH EVERY DAY Patient taking differently: Take 5 mg by mouth daily. TAKE 1 TABLET BY MOUTH EVERY DAY 12/04/17  Yes Baxley, Cresenciano Lick, MD  aspirin 81 MG tablet Take 81 mg by mouth daily.     Yes [provider]  buPROPion (WELLBUTRIN XL) 300 MG 24 hr tablet Take 1 tablet (300 mg total) by mouth daily. 07/04/18  Yes Baxley, Cresenciano Lick, MD  Cholecalciferol (VITAMIN D-3 PO) Take by mouth.     Yes [provider]  meloxicam (MOBIC) 15 MG tablet TAKE 1 TABLET BY MOUTH EVERY DAY Patient taking differently: Take 15 mg by mouth daily.  06/02/18  Yes Stefanie Libel, MD  Multiple Vitamin (MULTIVITAMIN) tablet Take 1 tablet by mouth daily.     Yes [provider]  simvastatin (ZOCOR) 40 MG tablet TAKE 1 TABLET BY MOUTH DAILY Patient taking differently: Take 40 mg by mouth daily at 6 PM.  04/10/18  Yes Baxley, Cresenciano Lick, MD  traMADol (ULTRAM) 50 MG tablet Take 1 tablet (50 mg total) by mouth every 6 (six) hours as needed. 12/19/17  Yes Stefanie Libel, MD    Physical Exam: Vitals:   07/18/18 1500 07/18/18 1700 07/18/18 1730 07/18/18 1732  BP: 120/69   121/77  Pulse: 93 92 91 91  Resp: 18 14 (!) 21 18  Temp:      TempSrc:      SpO2: 92% 93% 93% 94%    Constitutional: NAD, calm, comfortable Vitals:   07/18/18 1500 07/18/18 1700 07/18/18 1730 07/18/18 1732  BP: 120/69   121/77  Pulse: 93 92 91 91  Resp: 18 14 (!) 21 18  Temp:      TempSrc:      SpO2: 92% 93% 93% 94%   Eyes: PERRL, lids and conjunctivae normal ENMT: Mucous membranes are dry. Posterior pharynx clear of any exudate or lesions.Normal dentition.  Neck: normal, supple, no masses, no thyromegaly Respiratory: clear to auscultation bilaterally, no wheezing, no crackles. Normal  respiratory effort. No accessory muscle use.  Cardiovascular: Regular rate and rhythm, no murmurs / rubs / gallops. No extremity edema. 2+ pedal pulses. No carotid bruits.  Abdomen: no tenderness, no masses palpated. No hepatosplenomegaly. Bowel sounds positive.  Musculoskeletal: no clubbing / cyanosis. No joint deformity upper and lower extremities. Good ROM, no contractures. Normal muscle tone.  Skin: no rashes, lesions, ulcers. No induration Neurologic: CN 2-12 grossly intact. Sensation intact, DTR normal. Strength 5/5 in all 4.  Psychiatric: Normal judgment and insight. Alert and oriented x 3. Normal mood.   Labs on Admission: I have personally reviewed following labs and imaging studies  CBC: Recent Labs  Lab 07/18/18 1330  WBC 4.6  NEUTROABS 3.2  HGB 11.6*  HCT 37.6*  MCV 86.8  PLT 160   Basic Metabolic Panel: Recent Labs  Lab 07/18/18 1330  NA 132*  K 3.5  CL 100  CO2 23  GLUCOSE 85  BUN 18  CREATININE 0.80  CALCIUM 8.3*   GFR: Estimated Creatinine Clearance: 76.1 mL/min (by C-G formula based on SCr of 0.8 mg/dL). Liver Function Tests: Recent Labs  Lab 07/18/18 1330  AST 87*  ALT 51*  ALKPHOS 227*  BILITOT 1.2  PROT 6.9  ALBUMIN 2.4*   Recent Labs  Lab 07/18/18 1330  LIPASE 25   No results for input(s): AMMONIA in the last 168 hours. Coagulation Profile: No results for input(s): INR, PROTIME in the last 168 hours. Cardiac Enzymes: No results for input(s): CKTOTAL, CKMB, CKMBINDEX, TROPONINI in the last 168 hours. BNP (last 3 results) No results for input(s): PROBNP in the last 8760 hours. HbA1C: No results for input(s): HGBA1C in the last 72 hours. CBG: No results for input(s): GLUCAP in the last 168 hours. Lipid Profile: No results for input(s): CHOL, HDL, LDLCALC, TRIG, CHOLHDL, LDLDIRECT in the last 72 hours. Thyroid Function Tests: No results for input(s): TSH, T4TOTAL, FREET4, T3FREE, THYROIDAB in the last 72 hours. Anemia Panel: No  results for input(s): VITAMINB12, FOLATE, FERRITIN, TIBC, IRON, RETICCTPCT in the last 72 hours. Urine analysis:    Component Value Date/Time   COLORURINE AMBER (A) 07/18/2018 1514   APPEARANCEUR CLEAR 07/18/2018 1514   LABSPEC 1.017 07/18/2018 1514   PHURINE 6.0 07/18/2018 1514   GLUCOSEU NEGATIVE 07/18/2018 1514   HGBUR SMALL (A) 07/18/2018 1514   BILIRUBINUR NEGATIVE 07/18/2018 1514   BILIRUBINUR NEG 11/12/2017 1004   KETONESUR 20 (A) 07/18/2018 1514   PROTEINUR 30 (A) 07/18/2018 1514   UROBILINOGEN 0.2 11/12/2017 1004   UROBILINOGEN 0.2 04/16/2014 1357   NITRITE NEGATIVE 07/18/2018 1514   LEUKOCYTESUR NEGATIVE 07/18/2018 1514    Radiological Exams on Admission: Ct Angio Chest Pe W And/or Wo Contrast  Result Date: 07/18/2018 CLINICAL DATA:  Leg weakness with exertion EXAM: CT ANGIOGRAPHY CHEST WITH CONTRAST TECHNIQUE: Multidetector CT imaging of the chest was performed using the standard protocol during bolus administration of intravenous contrast. Multiplanar CT image reconstructions and MIPs were obtained to evaluate the vascular anatomy. CONTRAST:  133m OMNIPAQUE IOHEXOL 350 MG/ML SOLN COMPARISON:  11/17/2009 FINDINGS: Cardiovascular: Examination for pulmonary embolism is limited by patient and breath motion artifact throughout. Within this limitation, positive examination for pulmonary embolism, with segmental embolus present in the left lung base (series 6, image 53). There is no definite embolus identified elsewhere. The RV:LV ratio is enlarged, measuring up to 1.5:1. Left coronary artery calcifications. No pericardial effusion. Mediastinum/Nodes: No enlarged mediastinal, hilar, or axillary lymph nodes. Thyroid gland, trachea, and esophagus demonstrate no significant findings. Lungs/Pleura: There is a subpleural heterogeneous opacity of the left lung base distal to embolus, concerning for developing infarction. No pleural effusion or pneumothorax. Upper Abdomen: No acute abnormality.  Musculoskeletal: No chest wall abnormality. No acute or significant osseous findings. Review of the MIP images confirms the above findings. IMPRESSION: 1. Examination for pulmonary embolism is limited by patient and breath motion artifact throughout. Within this limitation, positive examination for pulmonary embolism, with segmental embolus present in the left lung base (series 6, image 53). There is no definite embolus identified elsewhere. 2. There is a subpleural heterogeneous opacity of the left lung base distal to embolus, concerning for developing infarction. 3. The RV:LV ratio is enlarged, measuring up to 1.5:1. This is concerning for right heart strain by criteria in  the setting of pulmonary embolism, however there is a relatively small, distal burden of embolus. Correlate with clinical concern for right heart strain and echocardiogram. 4.  Coronary artery disease. These results were called by telephone at the time of interpretation on 07/18/2018 at 3:33 pm to Dr. Virgel Manifold , who verbally acknowledged these results. Electronically Signed   By: Eddie Candle M.D.   On: 07/18/2018 15:36   Ct Abdomen Pelvis W Contrast  Result Date: 07/18/2018 CLINICAL DATA:  Painless jaundice, weight loss. EXAM: CT ABDOMEN AND PELVIS WITH CONTRAST TECHNIQUE: Multidetector CT imaging of the abdomen and pelvis was performed using the standard protocol following bolus administration of intravenous contrast. CONTRAST:  18m OMNIPAQUE IOHEXOL 350 MG/ML SOLN COMPARISON:  CT scan of November 17, 2009. FINDINGS: Lower chest: No acute abnormality. Hepatobiliary: No cholelithiasis or biliary dilatation is noted. Simple left hepatic cyst is noted. Pancreas: Unremarkable. No pancreatic ductal dilatation or surrounding inflammatory changes. Spleen: Normal in size without focal abnormality. Adrenals/Urinary Tract: Left adrenal gland appears normal. Stable calcified right adrenal myelolipoma is noted. Small nonobstructive calculus seen  in upper pole calyx of left kidney. Mild left hydronephrosis is noted secondary to 7 mm calculus in proximal left ureter. Urinary bladder is unremarkable. Stomach/Bowel: The stomach appears normal. There is no evidence of bowel obstruction or inflammation. The appendix is not visualized. Vascular/Lymphatic: Aortic atherosclerosis. No enlarged abdominal or pelvic lymph nodes. Reproductive: Mild prostatic enlargement is noted. Other: No abdominal wall hernia or abnormality. No abdominopelvic ascites. Musculoskeletal: Status post left total hip arthroplasty. No acute osseous abnormality is noted. IMPRESSION: Mild left hydronephrosis is noted secondary to 7 mm proximal left ureteral calculus. Stable partially calcified right adrenal myelolipoma is noted. Mild prostatic enlargement. Aortic Atherosclerosis (ICD10-I70.0). Electronically Signed   By: JMarijo ConceptionM.D.   On: 07/18/2018 15:51    EKG: Independently reviewed. NSR, low voltage.  Assessment/Plan Principal Problem:   PE (pulmonary thromboembolism) (HCC) Active Problems:   Hyperlipidemia   Seasonal affective disorder (HCC)   HTN (hypertension)   Depression   Ureteral calculus, left   Hydronephrosis of left kidney   #1 left segmental pulmonary embolus Per CT angiogram of the chest.  Patient had presented with a myriad of symptoms including worsening shortness of breath over the past month, generalized fatigue, shortness of breath on minimal exertion and going up stairs, decreased appetite, dizziness.  Patient with no recent surgeries, no recent long car rides or travel, no recent prior history of malignancy.  Patient of note did have a PE in 2006 after a right ankle fracture and was anticoagulated with Lovenox/Coumadin for 6 months which he completed.  Hypercoagulable panel was ordered per ED.  We will check a 2D echo as CT angiogram chest concerning for right ventricular strain.  Patient also noted on presentation to have systolic blood  pressures in the 100s.  Check lower extremity Dopplers.  Cycle cardiac enzymes every 6 hours x3.  IV heparin has been ordered per ED physician and currently pending.  If patient decompensates with hypotension or elevated cardiac enzymes with dizziness will need to consult with critical care medicine for further evaluation and management.  2.  Hypertension Patient noted on presentation to have systolic blood pressures in the 100s.  Borderline blood pressure likely secondary to problem #1.  Will hold antihypertensive medications.  Placed on IV fluids.  Follow.  3.  Depression Continue home regimen Wellbutrin.  4.  Mild hydronephrosis of the left kidney/left ureteral calculus Patient did have  some complaints of intermittent left groin pain.  Patient currently asymptomatic.  Renal function within normal limits.  Will monitor for now.  Outpatient follow-up with urology.  5.  Hyperlipidemia Check a fasting lipid panel.  Continue home regimen stain.   DVT prophylaxis: Heparin Code Status: Full Family Communication: Updated patient and wife at bedside. Disposition Plan: Likely home when clinically improved. Consults called: None Admission status: Admit as inpatient to stepdown unit.   Irine Seal MD [facility]  If 7PM-7AM, please contact night-coverage [email or web address]  07/18/2018, 6:18 PM

## 2018-07-18 NOTE — Progress Notes (Signed)
ED TO INPATIENT HANDOFF REPORT  Name/Age/Gender Luis Cisneros 78 y.o. male  Code Status Code Status History    Date Active Date Inactive Code Status Order ID Comments User Context   04/28/2014 1734 04/30/2014 1752 Full Code [id number]  Frederik Pear, MD Inpatient   Advance Care Planning Activity    Advance Directive Documentation     Most Recent Value  Type of Advance Directive  Healthcare Power of Attorney, Living will  Pre-existing out of facility DNR order (yellow form or pink MOST form)  -  "MOST" Form in Place?  -      Home/SNF/Other Home  Chief Complaint oxygen level low abdominal pain  Level of Care/Admitting Diagnosis ED Disposition    ED Disposition Condition [place]: [facility] [[id number]]  Level of Care: Stepdown [14]  Admit to SDU based on following criteria: Respiratory Distress:  Frequent assessment and/or intervention to maintain adequate ventilation/respiration, pulmonary toilet, and respiratory treatment.  Admit to SDU based on following criteria: Hemodynamic compromise or significant risk of instability:  Patient requiring short term acute titration and management of vasoactive drips, and invasive monitoring (i.e., CVP and Arterial line).  Covid Evaluation: Person Under Investigation (PUI)  Isolation Risk Level: Low Risk/Droplet (Less than 4L [place] supplementation)  Diagnosis: PE (pulmonary thromboembolism) ([place]) [[id number]]  Admitting Physician: Eugenie Filler [[id number]]  Attending Physician: Eugenie Filler [[id number]]  Estimated length of stay: past midnight tomorrow  Certification:: I certify this patient will need inpatient services for at least 2 midnights  PT Class (Do Not Modify): Inpatient [101]  PT Acc Code (Do Not Modify): Private [1]       Medical History Past Medical History:  Diagnosis Date  . Ankle fracture 2006   right  . Arthritis   . Depression   . Hyperlipidemia   . Hypertension   . Pulmonary  embolism ([place])   . Vitamin D deficiency     Allergies Allergies  Allergen Reactions  . Cephalexin   . Feldene [Piroxicam] Nausea Only and Other (See Comments)    Makes pt feel loopy    IV Location/Drains/Wounds Patient Lines/Drains/Airways Status   Active Line/Drains/Airways    Name:   Placement date:   Placement time:   Site:   Days:   Peripheral IV 07/18/18 Right Antecubital   07/18/18    1353    Antecubital   less than 1   Incision (Closed) 04/28/14 Hip Left   04/28/14    1451     1542          Labs/Imaging Results for orders placed or performed during the hospital encounter of 07/18/18 (from the past 48 hour(s))  Comprehensive metabolic panel     Status: Abnormal   Collection Time: 07/18/18  1:30 PM  Result Value Ref Range   Sodium 132 (L) 135 - 145 mmol/L   Potassium 3.5 3.5 - 5.1 mmol/L   Chloride 100 98 - 111 mmol/L   CO2 23 22 - 32 mmol/L   Glucose, Bld 85 70 - 99 mg/dL   BUN 18 8 - 23 mg/dL   Creatinine, Ser 0.80 0.61 - 1.24 mg/dL   Calcium 8.3 (L) 8.9 - 10.3 mg/dL   Total Protein 6.9 6.5 - 8.1 g/dL   Albumin 2.4 (L) 3.5 - 5.0 g/dL   AST 87 (H) 15 - 41 U/L   ALT 51 (H) 0 - 44 U/L   Alkaline Phosphatase 227 (H) 38 - 126 U/L  Total Bilirubin 1.2 0.3 - 1.2 mg/dL   GFR calc non Af Amer >60 >60 mL/min   GFR calc Af Amer >60 >60 mL/min   Anion gap 9 5 - 15    Comment: Performed at [facility], [place] [address] [place], [place] [address]  Lipase, blood     Status: None   Collection Time: 07/18/18  1:30 PM  Result Value Ref Range   Lipase 25 11 - 51 U/L    Comment: Performed at [facility], [place] [address] [place], [place] [address]  CBC with Differential     Status: Abnormal   Collection Time: 07/18/18  1:30 PM  Result Value Ref Range   WBC 4.6 4.0 - 10.5 K/uL   RBC 4.33 4.22 - 5.81 MIL/uL   Hemoglobin 11.6 (L) 13.0 - 17.0 g/dL   HCT 37.6 (L) 39.0 - 52.0 %   MCV 86.8 80.0 - 100.0 fL   MCH 26.8 26.0 - 34.0 pg   MCHC  30.9 30.0 - 36.0 g/dL   RDW 15.3 11.5 - 15.5 %   Platelets 217 150 - 400 K/uL   nRBC 0.0 0.0 - 0.2 %   Neutrophils Relative % 70 %   Neutro Abs 3.2 1.7 - 7.7 K/uL   Lymphocytes Relative 14 %   Lymphs Abs 0.6 (L) 0.7 - 4.0 K/uL   Monocytes Relative 15 %   Monocytes Absolute 0.7 0.1 - 1.0 K/uL   Eosinophils Relative 0 %   Eosinophils Absolute 0.0 0.0 - 0.5 K/uL   Basophils Relative 0 %   Basophils Absolute 0.0 0.0 - 0.1 K/uL   Immature Granulocytes 1 %   Abs Immature Granulocytes 0.03 0.00 - 0.07 K/uL    Comment: Performed at [facility], [place] [address], [place], [place] [address]  Urinalysis, Routine w reflex microscopic     Status: Abnormal   Collection Time: 07/18/18  3:14 PM  Result Value Ref Range   Color, Urine AMBER (A) YELLOW    Comment: BIOCHEMICALS MAY BE AFFECTED BY COLOR   APPearance CLEAR CLEAR   Specific Gravity, Urine 1.017 1.005 - 1.030   pH 6.0 5.0 - 8.0   Glucose, UA NEGATIVE NEGATIVE mg/dL   Hgb urine dipstick SMALL (A) NEGATIVE   Bilirubin Urine NEGATIVE NEGATIVE   Ketones, ur 20 (A) NEGATIVE mg/dL   Protein, ur 30 (A) NEGATIVE mg/dL   Nitrite NEGATIVE NEGATIVE   Leukocytes,Ua NEGATIVE NEGATIVE   RBC / HPF 11-20 0 - 5 RBC/hpf   WBC, UA 0-5 0 - 5 WBC/hpf   Bacteria, UA NONE SEEN NONE SEEN   Mucus PRESENT     Comment: Performed at [facility], [place] [address] [place], [place] [address]  Troponin I - Now Then Q6H     Status: None   Collection Time: 07/18/18  5:51 PM  Result Value Ref Range   Troponin I <0.03 <0.03 ng/mL    Comment: Performed at [facility], [place] [address], [place], [place] [address]   Ct Angio Chest Pe W And/or Wo Contrast  Result Date: 07/18/2018 CLINICAL DATA:  Leg weakness with exertion EXAM: CT ANGIOGRAPHY CHEST WITH CONTRAST TECHNIQUE: Multidetector CT imaging of the chest was performed using the standard protocol during bolus administration of intravenous contrast.  Multiplanar CT image reconstructions and MIPs were obtained to evaluate the vascular anatomy. CONTRAST:  16mL OMNIPAQUE IOHEXOL 350 MG/ML SOLN COMPARISON:  11/17/2009 FINDINGS: Cardiovascular: Examination for pulmonary embolism  is limited by patient and breath motion artifact throughout. Within this limitation, positive examination for pulmonary embolism, with segmental embolus present in the left lung base (series 6, image 53). There is no definite embolus identified elsewhere. The RV:LV ratio is enlarged, measuring up to 1.5:1. Left coronary artery calcifications. No pericardial effusion. Mediastinum/Nodes: No enlarged mediastinal, hilar, or axillary lymph nodes. Thyroid gland, trachea, and esophagus demonstrate no significant findings. Lungs/Pleura: There is a subpleural heterogeneous opacity of the left lung base distal to embolus, concerning for developing infarction. No pleural effusion or pneumothorax. Upper Abdomen: No acute abnormality. Musculoskeletal: No chest wall abnormality. No acute or significant osseous findings. Review of the MIP images confirms the above findings. IMPRESSION: 1. Examination for pulmonary embolism is limited by patient and breath motion artifact throughout. Within this limitation, positive examination for pulmonary embolism, with segmental embolus present in the left lung base (series 6, image 53). There is no definite embolus identified elsewhere. 2. There is a subpleural heterogeneous opacity of the left lung base distal to embolus, concerning for developing infarction. 3. The RV:LV ratio is enlarged, measuring up to 1.5:1. This is concerning for right heart strain by criteria in the setting of pulmonary embolism, however there is a relatively small, distal burden of embolus. Correlate with clinical concern for right heart strain and echocardiogram. 4.  Coronary artery disease. These results were called by telephone at the time of interpretation on 07/18/2018 at 3:33 pm to Dr.  Virgel Manifold , who verbally acknowledged these results. Electronically Signed   By: Eddie Candle M.D.   On: 07/18/2018 15:36   Ct Abdomen Pelvis W Contrast  Result Date: 07/18/2018 CLINICAL DATA:  Painless jaundice, weight loss. EXAM: CT ABDOMEN AND PELVIS WITH CONTRAST TECHNIQUE: Multidetector CT imaging of the abdomen and pelvis was performed using the standard protocol following bolus administration of intravenous contrast. CONTRAST:  116mL OMNIPAQUE IOHEXOL 350 MG/ML SOLN COMPARISON:  CT scan of November 17, 2009. FINDINGS: Lower chest: No acute abnormality. Hepatobiliary: No cholelithiasis or biliary dilatation is noted. Simple left hepatic cyst is noted. Pancreas: Unremarkable. No pancreatic ductal dilatation or surrounding inflammatory changes. Spleen: Normal in size without focal abnormality. Adrenals/Urinary Tract: Left adrenal gland appears normal. Stable calcified right adrenal myelolipoma is noted. Small nonobstructive calculus seen in upper pole calyx of left kidney. Mild left hydronephrosis is noted secondary to 7 mm calculus in proximal left ureter. Urinary bladder is unremarkable. Stomach/Bowel: The stomach appears normal. There is no evidence of bowel obstruction or inflammation. The appendix is not visualized. Vascular/Lymphatic: Aortic atherosclerosis. No enlarged abdominal or pelvic lymph nodes. Reproductive: Mild prostatic enlargement is noted. Other: No abdominal wall hernia or abnormality. No abdominopelvic ascites. Musculoskeletal: Status post left total hip arthroplasty. No acute osseous abnormality is noted. IMPRESSION: Mild left hydronephrosis is noted secondary to 7 mm proximal left ureteral calculus. Stable partially calcified right adrenal myelolipoma is noted. Mild prostatic enlargement. Aortic Atherosclerosis (ICD10-I70.0). Electronically Signed   By: Marijo Conception M.D.   On: 07/18/2018 15:51    Pending Labs Unresulted Labs (From admission, onward)    Start     Ordered    07/19/18 0500  CBC  Daily,   R     07/18/18 1617   07/19/18 0500  Lipid panel  Tomorrow morning,   R     07/18/18 1817   07/19/18 0030  Heparin level (unfractionated)  Once-Timed,   STAT     07/18/18 1617   07/18/18 1751  Troponin I - Now  Then Q6H  Now then every 6 hours,   R (with STAT occurrences)     07/18/18 1750   07/18/18 1608  Novel Coronavirus, NAA (hospital order; send-out to ref lab)  Once,   STAT    Comments: No isolation needed for this testing (if isolation ordered for another indication, maintain current isolation).   Question:  Pre-procedural testing  Answer:  Yes   07/18/18 1607   07/18/18 1539  Antithrombin III  (Hypercoagulable Panel, Comprehensive (PNL))  Once,   STAT     07/18/18 1538   07/18/18 1539  Protein C activity  (Hypercoagulable Panel, Comprehensive (PNL))  Once,   STAT     07/18/18 1538   07/18/18 1539  Protein C, total  (Hypercoagulable Panel, Comprehensive (PNL))  Once,   STAT     07/18/18 1538   07/18/18 1539  Protein S activity  (Hypercoagulable Panel, Comprehensive (PNL))  Once,   STAT     07/18/18 1538   07/18/18 1539  Protein S, total  (Hypercoagulable Panel, Comprehensive (PNL))  Once,   STAT     07/18/18 1538   07/18/18 1539  Lupus anticoagulant panel  (Hypercoagulable Panel, Comprehensive (PNL))  Once,   STAT     07/18/18 1538   07/18/18 1539  Beta-2-glycoprotein i abs, IgG/M/A  (Hypercoagulable Panel, Comprehensive (PNL))  Once,   STAT     07/18/18 1538   07/18/18 1539  Homocysteine, serum  (Hypercoagulable Panel, Comprehensive (PNL))  Once,   STAT     07/18/18 1538   07/18/18 1539  Factor 5 leiden  (Hypercoagulable Panel, Comprehensive (PNL))  Once,   STAT     07/18/18 1538   07/18/18 1539  Prothrombin gene mutation  (Hypercoagulable Panel, Comprehensive (PNL))  Once,   STAT     07/18/18 1538   07/18/18 1539  Cardiolipin antibodies, IgG, IgM, IgA  (Hypercoagulable Panel, Comprehensive (PNL))  Once,   STAT     07/18/18 1538   Signed and Held   Magnesium  Add-on,   R     Signed and Held   Signed and Held  Protime-INR  Once,   R     Signed and Held   Signed and Held  Urine culture  Once,   R     Signed and Held   Signed and Held  Urinalysis, Complete w Microscopic  Once,   R     Signed and Held   Signed and Held  Comprehensive metabolic panel  Tomorrow morning,   R     Signed and Held   Signed and Held  CBC  Tomorrow morning,   R     Signed and Held          Vitals/Pain Today's Vitals   07/18/18 1930 07/18/18 2000 07/18/18 2030 07/18/18 2100  BP: 114/72 (!) 123/92 123/75 116/65  Pulse: 87 86 86 85  Resp: (!) 22 (!) 22 (!) 25 12  Temp:      TempSrc:      SpO2: 93% 93% 93% 94%  PainSc:        Isolation Precautions No active isolations  Medications Medications  0.9 %  sodium chloride infusion ( Intravenous New Bag/Given 07/18/18 1353)  sodium chloride (PF) 0.9 % injection (has no administration in time range)  heparin ADULT infusion 100 units/mL (25000 units/26mL sodium chloride 0.45%) (1,300 Units/hr Intravenous New Bag/Given 07/18/18 1802)  iohexol (OMNIPAQUE) 350 MG/ML injection 100 mL (100 mLs Intravenous Contrast Given 07/18/18 1507)  heparin  bolus via infusion 2,500 Units (2,500 Units Intravenous Bolus from Bag 07/18/18 1803)    Mobility walks

## 2018-07-18 NOTE — Progress Notes (Signed)
ANTICOAGULATION CONSULT NOTE   Pharmacy Consult for heparin Indication: acute pulmonary embolus  Allergies  Allergen Reactions  . Cephalexin   . Feldene [Piroxicam] Nausea Only and Other (See Comments)    Makes pt feel loopy    Patient Measurements: weight 82 kg; height 69 inches   Heparin Dosing Weight: 82 kg  Vital Signs: Temp: 98.3 F (36.8 C) (06/12 1300) Temp Source: Oral (06/12 1300) BP: 120/69 (06/12 1500) Pulse Rate: 93 (06/12 1500)  Labs: Recent Labs    07/18/18 1330  HGB 11.6*  HCT 37.6*  PLT 217  CREATININE 0.80    Estimated Creatinine Clearance: 76.1 mL/min (by C-G formula based on SCr of 0.8 mg/dL).   Assessment: Patient is a  78 y.o M presented to the ED from PCP office for workup of unexplained weight loss, SOB and elevated LFTs.  Abd CT showed left ureteral calculus and right adrenal myelolipoma.  Chest CTA showed "segmental embolus present in the left lung base" with suspected right heart strain.  To start heparin drip for acute PE.  Goal of Therapy:  Heparin level 0.3-0.7 units/ml Monitor platelets by anticoagulation protocol: Yes   Plan:  - heparin 2500 units IV x1 units bolus, then drip at 1300 units/hr (per [place] calc) - check 8 hr heparin level -monitor for s/s bleeding  Dia Sitter P 07/18/2018,4:03 PM

## 2018-07-19 ENCOUNTER — Inpatient Hospital Stay (HOSPITAL_COMMUNITY): Payer: Medicare Other

## 2018-07-19 DIAGNOSIS — I2699 Other pulmonary embolism without acute cor pulmonale: Secondary | ICD-10-CM

## 2018-07-19 DIAGNOSIS — I82403 Acute embolism and thrombosis of unspecified deep veins of lower extremity, bilateral: Secondary | ICD-10-CM | POA: Diagnosis present

## 2018-07-19 DIAGNOSIS — R740 Nonspecific elevation of levels of transaminase and lactic acid dehydrogenase [LDH]: Secondary | ICD-10-CM

## 2018-07-19 DIAGNOSIS — R7401 Elevation of levels of liver transaminase levels: Secondary | ICD-10-CM | POA: Diagnosis present

## 2018-07-19 LAB — COMPREHENSIVE METABOLIC PANEL
ALT: 51 U/L — ABNORMAL HIGH (ref 0–44)
AST: 97 U/L — ABNORMAL HIGH (ref 15–41)
Albumin: 2.2 g/dL — ABNORMAL LOW (ref 3.5–5.0)
Alkaline Phosphatase: 238 U/L — ABNORMAL HIGH (ref 38–126)
Anion gap: 9 (ref 5–15)
BUN: 13 mg/dL (ref 8–23)
CO2: 24 mmol/L (ref 22–32)
Calcium: 8 mg/dL — ABNORMAL LOW (ref 8.9–10.3)
Chloride: 100 mmol/L (ref 98–111)
Creatinine, Ser: 0.78 mg/dL (ref 0.61–1.24)
GFR calc Af Amer: >60 mL/min (ref >60)
GFR calc non Af Amer: >60 mL/min (ref >60)
Glucose, Bld: 98 mg/dL (ref 70–99)
Potassium: 3.5 mmol/L (ref 3.5–5.1)
Sodium: 133 mmol/L — ABNORMAL LOW (ref 135–145)
Total Bilirubin: 0.9 mg/dL (ref 0.3–1.2)
Total Protein: 6.4 g/dL — ABNORMAL LOW (ref 6.5–8.1)

## 2018-07-19 LAB — CBC
HCT: 33.7 % — ABNORMAL LOW (ref 39.0–52.0)
HCT: 35.6 % — ABNORMAL LOW (ref 39.0–52.0)
Hemoglobin: 10.2 g/dL — ABNORMAL LOW (ref 13.0–17.0)
Hemoglobin: 11.1 g/dL — ABNORMAL LOW (ref 13.0–17.0)
MCH: 26.6 pg (ref 26.0–34.0)
MCH: 27.2 pg (ref 26.0–34.0)
MCHC: 30.3 g/dL (ref 30.0–36.0)
MCHC: 31.2 g/dL (ref 30.0–36.0)
MCV: 88 fL (ref 80.0–100.0)
MCV: 87.3 fL (ref 80.0–100.0)
Platelets: 188 10*3/uL (ref 150–400)
Platelets: 181 10*3/uL (ref 150–400)
RBC: 3.83 MIL/uL — ABNORMAL LOW (ref 4.22–5.81)
RBC: 4.08 MIL/uL — ABNORMAL LOW (ref 4.22–5.81)
RDW: 15.3 % (ref 11.5–15.5)
RDW: 15.2 % (ref 11.5–15.5)
WBC: 3.7 10*3/uL — ABNORMAL LOW (ref 4.0–10.5)
WBC: 3.8 10*3/uL — ABNORMAL LOW (ref 4.0–10.5)
nRBC: 0 % (ref 0.0–0.2)
nRBC: 0 % (ref 0.0–0.2)

## 2018-07-19 LAB — ECHOCARDIOGRAM COMPLETE
Height: 69 in
Weight: 2821.89 oz

## 2018-07-19 LAB — TROPONIN I
Troponin I: <0.03 ng/mL (ref <0.03)
Troponin I: <0.03 ng/mL (ref <0.03)

## 2018-07-19 LAB — LIPID PANEL
Cholesterol: 117 mg/dL (ref 0–200)
HDL: 16 mg/dL — ABNORMAL LOW (ref >40)
LDL Cholesterol: 80 mg/dL (ref 0–99)
Total CHOL/HDL Ratio: 7.3 RATIO
Triglycerides: 104 mg/dL (ref <150)
VLDL: 21 mg/dL (ref 0–40)

## 2018-07-19 LAB — GLUCOSE, CAPILLARY
Glucose-Capillary: 122 mg/dL — ABNORMAL HIGH (ref 70–99)
Glucose-Capillary: 82 mg/dL (ref 70–99)

## 2018-07-19 LAB — PROTIME-INR
INR: 1.2 (ref 0.8–1.2)
Prothrombin Time: 15 seconds (ref 11.4–15.2)

## 2018-07-19 LAB — NOVEL CORONAVIRUS, NAA (HOSP ORDER, SEND-OUT TO REF LAB; TAT 18-24 HRS): SARS-CoV-2, NAA: NOT DETECTED

## 2018-07-19 LAB — HOMOCYSTEINE: Homocysteine: <3 umol/L (ref 0.0–19.2)

## 2018-07-19 LAB — HEPARIN LEVEL (UNFRACTIONATED)
Heparin Unfractionated: 0.26 IU/mL — ABNORMAL LOW (ref 0.30–0.70)
Heparin Unfractionated: 0.52 IU/mL (ref 0.30–0.70)
Heparin Unfractionated: 0.45 IU/mL (ref 0.30–0.70)

## 2018-07-19 MED ORDER — HEPARIN (PORCINE) 25000 UT/250ML-% IV SOLN
1350.0000 [IU]/h | INTRAVENOUS | Status: AC
  Administered 2018-07-19 – 2018-07-20 (×5): 1500 [IU]/h via INTRAVENOUS
  Administered 2018-07-21: 1350 [IU]/h via INTRAVENOUS
  Filled 2018-07-19 (×4): qty 250

## 2018-07-19 MED ORDER — CHLORHEXIDINE GLUCONATE CLOTH 2 % EX PADS
6.0000 | MEDICATED_PAD | Freq: Every day | CUTANEOUS | Status: DC
  Administered 2018-07-19 – 2018-07-22 (×3): 6 via TOPICAL

## 2018-07-19 MED ORDER — ORAL CARE MOUTH RINSE
15.0000 mL | Freq: Two times a day (BID) | OROMUCOSAL | Status: DC
  Administered 2018-07-19 – 2018-07-22 (×6): 15 mL via OROMUCOSAL

## 2018-07-19 MED ORDER — POTASSIUM CHLORIDE CRYS ER 20 MEQ PO TBCR
40.0000 meq | EXTENDED_RELEASE_TABLET | Freq: Once | ORAL | Status: AC
  Administered 2018-07-19: 40 meq via ORAL
  Filled 2018-07-19: qty 2

## 2018-07-19 NOTE — Progress Notes (Signed)
Bilateral lower extremity venous duplex completed. Refer to "CV Proc" under chart review to view preliminary results.  Critical results discussed with Maudie Mercury, RN and Dr. Grandville Silos.  07/19/2018 12:48 PM Maudry Mayhew, MHA, RVT, RDCS, RDMS

## 2018-07-19 NOTE — Progress Notes (Signed)
Pharmacy - IV heparin  Assessment:    Please see note from Arlyn Dunning, PharmD earlier today for full details.  Briefly, 78 y.o. male on IV heparin for acute PE with R heart strain.   Confirmatory heparin level therapeutic at 0.45 on 1500 units/hr  Plan:   Continue heparin at 1500 units/hr  Daily heparin level and CBC  Luis Cisneros, PharmD, BCPS [phone] 07/19/2018, 6:42 PM

## 2018-07-19 NOTE — Progress Notes (Signed)
PROGRESS NOTE    Luis Cisneros  CHE:[id number] DOB: March 02, 1940 DOA: 07/18/2018 PCP: Elby Showers, MD    Brief Narrative:  Patient 78 year old gentleman history of prior pulmonary embolism diagnosed in 2006 after ankle fracture status post 6 months of anticoagulation, hyperlipidemia, hypertension, vitamin D deficiency, depression presented to the ED with worsening shortness of breath x1 month, fatigue, decreased appetite.  COVID-19 test done in the outpatient setting was negative.  Patient noted to be hypoxic with sats of 85% on room air and not on home O2.  CT angiogram chest concerning for pulmonary emboli.  Patient placed on IV heparin.  2D echo, lower extremity Dopplers ordered.   Assessment & Plan:   Principal Problem:   PE (pulmonary thromboembolism) ([place]) Active Problems:   Hyperlipidemia   Seasonal affective disorder (HCC)   HTN (hypertension)   Depression   Ureteral calculus, left   Hydronephrosis of left kidney   Transaminitis  #1 left segmental pulmonary embolus Per CT angiogram of the chest.  Patient had presented with a myriad of symptoms including worsening shortness of breath over the past month, generalized fatigue, shortness of breath on minimal exertion and going up stairs, decreased appetite, dizziness.  Patient with no recent surgeries, no recent long car rides or travel, no recent prior history of malignancy.  Patient of note did have a PE in 2006 after a right ankle fracture and was anticoagulated with Lovenox/Coumadin for 6 months which he completed.  Hypercoagulable panel was ordered per ED and pending.    2D echo pending to rule out right ventricular strain. Patient also noted on presentation to have systolic blood pressures in the 100s.  Patient with blood pressure of 92/43 early this morning.  Systolic blood pressures now in the 100s.  Lower extremity Dopplers pending.   Enzymes negative x3.  Continue IV heparin.  If patient decompensates with hypotension or  elevated cardiac enzymes with dizziness will need to consult with critical care medicine for further evaluation and management.  Continue anticoagulation with IV heparin and if continued improvement could likely transition to Xarelto tomorrow.  2.  Hypertension Patient noted on presentation to have systolic blood pressures in the 100s.  Borderline blood pressure likely secondary to problem #1.    Continue to hold antihypertensive medications.  Continue IV fluids.  Follow.    3.  Depression Stable.  Continue Wellbutrin.   4.  Mild hydronephrosis of the left kidney/left ureteral calculus Patient did have some complaints of intermittent left groin pain.  Patient currently asymptomatic.  Renal function within normal limits.  Will monitor for now.  Outpatient follow-up with urology.  5.  Hyperlipidemia Fasting lipid panel with LDL of 80.  Patient noted to have a transaminitis which is being worked up in the outpatient setting.  Will discontinue statin.  Outpatient follow-up.  6.  Transaminitis Questionable etiology.  CT abdomen and pelvis done on admission negative for cholelithiasis or biliary dilatation.  Simple left hepatic cyst noted.  Pancreas unremarkable.  Patient with no abdominal symptoms.  Patient noted to have a hep B core antibody positive in January 2020, however repeat acute hepatitis panel with hepatitis B surface antigen and antibody negative.  Will DC statin.  Outpatient follow-up for further evaluation.   DVT prophylaxis: Heparin Code Status: Full Family Communication: Updated patient. Updated wife via telephone.  Disposition Plan: Remain in stepdown unit today.  Likely transfer to telemetry if blood pressure improves tomorrow.  Likely home when clinically improved.   Consultants:  None  Procedures:   CT abdomen and pelvis 07/18/2018  CT angio chest 07/18/2018  Chest x-ray 07/04/2018  2D echo pending  Lower extremity Dopplers pending  Antimicrobials:    None   Subjective: Patient laying in bed.  Denies any chest pain or shortness of breath.  Blood pressure noted to be 92/43 at 5 AM this morning.  Patient states did not have any dinner since admission.  Objective: Vitals:   07/19/18 0500 07/19/18 0600 07/19/18 0700 07/19/18 0912  BP: (!) 92/43 (!) 99/47 131/76   Pulse: 81 83 84   Resp: '18 17 16   '$ Temp:    98 F (36.7 C)  TempSrc:    Oral  SpO2: 92% 93% 93%   Weight: 80 kg     Height:        Intake/Output Summary (Last 24 hours) at 07/19/2018 0917 Last data filed at 07/19/2018 0700 Gross per 24 hour  Intake --  Output 600 ml  Net -600 ml   Filed Weights   07/18/18 2030 07/18/18 2302 07/19/18 0500  Weight: 80.6 kg 80.6 kg 80 kg    Examination:  General exam: Appears calm and comfortable  Respiratory system: Clear to auscultation. Respiratory effort normal. Cardiovascular system: S1 & S2 heard, RRR. No JVD, murmurs, rubs, gallops or clicks. No pedal edema. Gastrointestinal system: Abdomen is nondistended, soft and nontender. No organomegaly or masses felt. Normal bowel sounds heard. Central nervous system: Alert and oriented. No focal neurological deficits. Extremities: Symmetric 5 x 5 power. Skin: No rashes, lesions or ulcers Psychiatry: Judgement and insight appear normal. Mood & affect appropriate.     Data Reviewed: I have personally reviewed following labs and imaging studies  CBC: Recent Labs  Lab 07/18/18 1330 07/19/18 0041 07/19/18 0629  WBC 4.6 3.7* 3.8*  NEUTROABS 3.2  --   --   HGB 11.6* 10.2* 11.1*  HCT 37.6* 33.7* 35.6*  MCV 86.8 88.0 87.3  PLT 217 188 179   Basic Metabolic Panel: Recent Labs  Lab 07/18/18 1330 07/18/18 1751 07/19/18 0629  NA 132*  --  133*  K 3.5  --  3.5  CL 100  --  100  CO2 23  --  24  GLUCOSE 85  --  98  BUN 18  --  13  CREATININE 0.80  --  0.78  CALCIUM 8.3*  --  8.0*  MG  --  2.1  --    GFR: Estimated Creatinine Clearance: 76.1 mL/min (by C-G formula  based on SCr of 0.78 mg/dL). Liver Function Tests: Recent Labs  Lab 07/18/18 1330 07/19/18 0629  AST 87* 97*  ALT 51* 51*  ALKPHOS 227* 238*  BILITOT 1.2 0.9  PROT 6.9 6.4*  ALBUMIN 2.4* 2.2*   Recent Labs  Lab 07/18/18 1330  LIPASE 25   No results for input(s): AMMONIA in the last 168 hours. Coagulation Profile: Recent Labs  Lab 07/19/18 0041  INR 1.2   Cardiac Enzymes: Recent Labs  Lab 07/18/18 1751 07/19/18 0041 07/19/18 0629  TROPONINI <0.03 <0.03 <0.03   BNP (last 3 results) No results for input(s): PROBNP in the last 8760 hours. HbA1C: No results for input(s): HGBA1C in the last 72 hours. CBG: Recent Labs  Lab 07/19/18 0748  GLUCAP 122*   Lipid Profile: Recent Labs    07/19/18 0041  CHOL 117  HDL 16*  LDLCALC 80  TRIG 104  CHOLHDL 7.3   Thyroid Function Tests: No results for input(s): TSH, T4TOTAL, FREET4, T3FREE,  THYROIDAB in the last 72 hours. Anemia Panel: No results for input(s): VITAMINB12, FOLATE, FERRITIN, TIBC, IRON, RETICCTPCT in the last 72 hours. Sepsis Labs: No results for input(s): PROCALCITON, LATICACIDVEN in the last 168 hours.  No results found for this or any previous visit (from the past 240 hour(s)).       Radiology Studies: Ct Angio Chest Pe W And/or Wo Contrast  Result Date: 07/18/2018 CLINICAL DATA:  Leg weakness with exertion EXAM: CT ANGIOGRAPHY CHEST WITH CONTRAST TECHNIQUE: Multidetector CT imaging of the chest was performed using the standard protocol during bolus administration of intravenous contrast. Multiplanar CT image reconstructions and MIPs were obtained to evaluate the vascular anatomy. CONTRAST:  181m OMNIPAQUE IOHEXOL 350 MG/ML SOLN COMPARISON:  11/17/2009 FINDINGS: Cardiovascular: Examination for pulmonary embolism is limited by patient and breath motion artifact throughout. Within this limitation, positive examination for pulmonary embolism, with segmental embolus present in the left lung base (series  6, image 53). There is no definite embolus identified elsewhere. The RV:LV ratio is enlarged, measuring up to 1.5:1. Left coronary artery calcifications. No pericardial effusion. Mediastinum/Nodes: No enlarged mediastinal, hilar, or axillary lymph nodes. Thyroid gland, trachea, and esophagus demonstrate no significant findings. Lungs/Pleura: There is a subpleural heterogeneous opacity of the left lung base distal to embolus, concerning for developing infarction. No pleural effusion or pneumothorax. Upper Abdomen: No acute abnormality. Musculoskeletal: No chest wall abnormality. No acute or significant osseous findings. Review of the MIP images confirms the above findings. IMPRESSION: 1. Examination for pulmonary embolism is limited by patient and breath motion artifact throughout. Within this limitation, positive examination for pulmonary embolism, with segmental embolus present in the left lung base (series 6, image 53). There is no definite embolus identified elsewhere. 2. There is a subpleural heterogeneous opacity of the left lung base distal to embolus, concerning for developing infarction. 3. The RV:LV ratio is enlarged, measuring up to 1.5:1. This is concerning for right heart strain by criteria in the setting of pulmonary embolism, however there is a relatively small, distal burden of embolus. Correlate with clinical concern for right heart strain and echocardiogram. 4.  Coronary artery disease. These results were called by telephone at the time of interpretation on 07/18/2018 at 3:33 pm to Dr. SVirgel Manifold, who verbally acknowledged these results. Electronically Signed   By: AEddie CandleM.D.   On: 07/18/2018 15:36   Ct Abdomen Pelvis W Contrast  Result Date: 07/18/2018 CLINICAL DATA:  Painless jaundice, weight loss. EXAM: CT ABDOMEN AND PELVIS WITH CONTRAST TECHNIQUE: Multidetector CT imaging of the abdomen and pelvis was performed using the standard protocol following bolus administration of  intravenous contrast. CONTRAST:  1037mOMNIPAQUE IOHEXOL 350 MG/ML SOLN COMPARISON:  CT scan of November 17, 2009. FINDINGS: Lower chest: No acute abnormality. Hepatobiliary: No cholelithiasis or biliary dilatation is noted. Simple left hepatic cyst is noted. Pancreas: Unremarkable. No pancreatic ductal dilatation or surrounding inflammatory changes. Spleen: Normal in size without focal abnormality. Adrenals/Urinary Tract: Left adrenal gland appears normal. Stable calcified right adrenal myelolipoma is noted. Small nonobstructive calculus seen in upper pole calyx of left kidney. Mild left hydronephrosis is noted secondary to 7 mm calculus in proximal left ureter. Urinary bladder is unremarkable. Stomach/Bowel: The stomach appears normal. There is no evidence of bowel obstruction or inflammation. The appendix is not visualized. Vascular/Lymphatic: Aortic atherosclerosis. No enlarged abdominal or pelvic lymph nodes. Reproductive: Mild prostatic enlargement is noted. Other: No abdominal wall hernia or abnormality. No abdominopelvic ascites. Musculoskeletal: Status post left total hip  arthroplasty. No acute osseous abnormality is noted. IMPRESSION: Mild left hydronephrosis is noted secondary to 7 mm proximal left ureteral calculus. Stable partially calcified right adrenal myelolipoma is noted. Mild prostatic enlargement. Aortic Atherosclerosis (ICD10-I70.0). Electronically Signed   By: Marijo Conception M.D.   On: 07/18/2018 15:51        Scheduled Meds:  buPROPion  300 mg Oral Daily   Chlorhexidine Gluconate Cloth  6 each Topical Daily   mouth rinse  15 mL Mouth Rinse BID   multivitamin with minerals  1 tablet Oral Daily   potassium chloride  40 mEq Oral Once   senna  1 tablet Oral BID   sodium chloride flush  3 mL Intravenous Q12H   Continuous Infusions:  sodium chloride 100 mL/hr at 07/19/18 0742   heparin 1,500 Units/hr (07/19/18 0231)     LOS: 1 day    Time spent: 40  minutes    Irine Seal, MD [facility]  If 7PM-7AM, please contact night-coverage [email or web address] 07/19/2018, 9:17 AM

## 2018-07-19 NOTE — Progress Notes (Signed)
ANTICOAGULATION CONSULT NOTE   Pharmacy Consult for heparin Indication: acute pulmonary embolus  Allergies  Allergen Reactions  . Cephalexin   . Feldene [Piroxicam] Nausea Only and Other (See Comments)    Makes pt feel loopy    Patient Measurements: weight 82 kg; height 69 inches Height: 5\' 9"  (175.3 cm) Weight: 177 lb 11.1 oz (80.6 kg) IBW/kg (Calculated) : 70.7 Heparin Dosing Weight: 82 kg  Vital Signs: Temp: 99.1 F (37.3 C) (06/12 2302) Temp Source: Oral (06/12 2302) BP: 122/72 (06/13 0100) Pulse Rate: 83 (06/13 0100)  Labs: Recent Labs    07/18/18 1330 07/18/18 1751 07/19/18 0041  HGB 11.6*  --  10.2*  HCT 37.6*  --  33.7*  PLT 217  --  188  LABPROT  --   --  15.0  INR  --   --  1.2  HEPARINUNFRC  --   --  0.26*  CREATININE 0.80  --   --   TROPONINI  --  <0.03 <0.03    Estimated Creatinine Clearance: 76.1 mL/min (by C-G formula based on SCr of 0.8 mg/dL).   Assessment: Patient is a  78 y.o M presented to the ED from PCP office for workup of unexplained weight loss, SOB and elevated LFTs.  Abd CT showed left ureteral calculus and right adrenal myelolipoma.  Chest CTA showed "segmental embolus present in the left lung base" with suspected right heart strain.  To start heparin drip for acute PE. Today, 6/13 H/H = 10.2/33.7, plts=188 0041 HL=0.26 below goal, no infusion or bleeding issues per RN.   Goal of Therapy:  Heparin level 0.3-0.7 units/ml Monitor platelets by anticoagulation protocol: Yes   Plan:  - increase  Heparin drip to 1500 units/hr - check 8 hr heparin level -monitor for s/s bleeding  Lawana Pai R 07/19/2018,1:48 AM

## 2018-07-19 NOTE — Progress Notes (Signed)
OT Cancellation Note  Patient Details Name: AYLAN BAYONA MRN: [id number] DOB: 03-Mar-1940   Cancelled Treatment:    Reason Eval/Treat Not Completed: Patient not medically ready   Noted pt with new PE and beginning Heparin drip. Will check on pt next day in regards to OT eval and activity   Kari Baars, [place] [phone] Office- [phone], Edwena Felty D 07/19/2018, 2:51 PM

## 2018-07-19 NOTE — Progress Notes (Signed)
ANTICOAGULATION CONSULT NOTE   Pharmacy Consult for heparin Indication: acute pulmonary embolus  Allergies  Allergen Reactions  . Cephalexin   . Feldene [Piroxicam] Nausea Only and Other (See Comments)    Makes pt feel loopy    Patient Measurements: weight 82 kg; height 69 inches Height: 5\' 9"  (175.3 cm) Weight: 176 lb 5.9 oz (80 kg) IBW/kg (Calculated) : 70.7 Heparin Dosing Weight: 82 kg  Vital Signs: Temp: 98 F (36.7 C) (06/13 0912) Temp Source: Oral (06/13 0912) BP: 131/76 (06/13 0700) Pulse Rate: 84 (06/13 0700)  Labs: Recent Labs    07/18/18 1330 07/18/18 1751 07/19/18 0041 07/19/18 0629 07/19/18 0936  HGB 11.6*  --  10.2* 11.1*  --   HCT 37.6*  --  33.7* 35.6*  --   PLT 217  --  188 181  --   LABPROT  --   --  15.0  --   --   INR  --   --  1.2  --   --   HEPARINUNFRC  --   --  0.26*  --  0.52  CREATININE 0.80  --   --  0.78  --   TROPONINI  --  <0.03 <0.03 <0.03  --     Estimated Creatinine Clearance: 76.1 mL/min (by C-G formula based on SCr of 0.78 mg/dL).   Assessment: Patient is a  78 y.o M presented to the ED from PCP office for workup of unexplained weight loss, SOB and elevated LFTs.  Abd CT showed left ureteral calculus and right adrenal myelolipoma.  Chest CTA showed "segmental embolus present in the left lung base" with suspected right heart strain.  To start heparin drip for acute PE.  Today, 6/13  Heparin level therapeutic on current IV heparin rate of 1500 units/hr  CBC stable  No reported bleeding  Goal of Therapy:  Heparin level 0.3-0.7 units/ml Monitor platelets by anticoagulation protocol: Yes   Plan:  1) Continue IV Heparin rate at 1500 units/hr 2) Recheck heparin level in 6 hours to confirm continued goal level at current IV heparin rate 3) Monitor for s/s bleeding  Kara Mead 07/19/2018,10:59 AM

## 2018-07-19 NOTE — Progress Notes (Signed)
PT Cancellation Note  Patient Details Name: Luis Cisneros MRN: [id number] DOB: 07-Jan-1941   Cancelled Treatment:    Reason Eval/Treat Not Completed: Medical issues which prohibited therapy Noted pt with new PE and beginning Heparin drip this am. PT to check back in 24 hours.   Julien Girt, PT Acute Rehabilitation Services Pager [phone]  Office [phone]    Roxine Caddy D Elonda Husky 07/19/2018, 3:21 PM

## 2018-07-19 NOTE — Progress Notes (Signed)
*  PRELIMINARY RESULTS* Echocardiogram 2D Echocardiogram has been performed.  Leavy Cella 07/19/2018, 3:53 PM

## 2018-07-19 NOTE — Plan of Care (Signed)
  Problem: Activity: Goal: Risk for activity intolerance will decrease Outcome: Progressing   

## 2018-07-20 ENCOUNTER — Inpatient Hospital Stay (HOSPITAL_COMMUNITY): Payer: Medicare Other

## 2018-07-20 DIAGNOSIS — R0902 Hypoxemia: Secondary | ICD-10-CM

## 2018-07-20 DIAGNOSIS — I82403 Acute embolism and thrombosis of unspecified deep veins of lower extremity, bilateral: Secondary | ICD-10-CM

## 2018-07-20 LAB — BASIC METABOLIC PANEL
Anion gap: 4 — ABNORMAL LOW (ref 5–15)
BUN: 9 mg/dL (ref 8–23)
CO2: 24 mmol/L (ref 22–32)
Calcium: 7.7 mg/dL — ABNORMAL LOW (ref 8.9–10.3)
Chloride: 104 mmol/L (ref 98–111)
Creatinine, Ser: 0.72 mg/dL (ref 0.61–1.24)
GFR calc Af Amer: >60 mL/min (ref >60)
GFR calc non Af Amer: >60 mL/min (ref >60)
Glucose, Bld: 89 mg/dL (ref 70–99)
Potassium: 3.8 mmol/L (ref 3.5–5.1)
Sodium: 132 mmol/L — ABNORMAL LOW (ref 135–145)

## 2018-07-20 LAB — CBC
HCT: 34.4 % — ABNORMAL LOW (ref 39.0–52.0)
Hemoglobin: 10.4 g/dL — ABNORMAL LOW (ref 13.0–17.0)
MCH: 26.7 pg (ref 26.0–34.0)
MCHC: 30.2 g/dL (ref 30.0–36.0)
MCV: 88.2 fL (ref 80.0–100.0)
Platelets: 192 10*3/uL (ref 150–400)
RBC: 3.9 MIL/uL — ABNORMAL LOW (ref 4.22–5.81)
RDW: 15.2 % (ref 11.5–15.5)
WBC: 3.2 10*3/uL — ABNORMAL LOW (ref 4.0–10.5)
nRBC: 0 % (ref 0.0–0.2)

## 2018-07-20 LAB — BETA-2-GLYCOPROTEIN I ABS, IGG/M/A
Beta-2 Glyco I IgG: <9 GPI IgG units (ref 0–20)
Beta-2-Glycoprotein I IgA: <9 GPI IgA units (ref 0–25)
Beta-2-Glycoprotein I IgM: 13 GPI IgM units (ref 0–32)

## 2018-07-20 LAB — URINE CULTURE: Culture: NO GROWTH

## 2018-07-20 LAB — GLUCOSE, CAPILLARY: Glucose-Capillary: 78 mg/dL (ref 70–99)

## 2018-07-20 LAB — VITAMIN B12: Vitamin B-12: 531 pg/mL (ref 180–914)

## 2018-07-20 LAB — RPR: RPR Ser Ql: NONREACTIVE

## 2018-07-20 LAB — HEPARIN LEVEL (UNFRACTIONATED): Heparin Unfractionated: 0.57 IU/mL (ref 0.30–0.70)

## 2018-07-20 LAB — PROTEIN C, TOTAL: Protein C, Total: 53 % — ABNORMAL LOW (ref 60–150)

## 2018-07-20 NOTE — Evaluation (Signed)
Physical Therapy Evaluation Patient Details Name: Luis Cisneros MRN: [id number] DOB: [birth year] Today's Date: 07/20/2018   History of Present Illness  Patient 78 year old gentleman history of prior pulmonary embolism diagnosed in 2006 after ankle fracture status post 6 months of anticoagulation, hyperlipidemia, hypertension, vitamin D deficiency, depression presented to the ED with worsening shortness of breath x1 month, fatigue, decreased appetite.  COVID-19 test done in the outpatient setting was negative.  Patient noted to be hypoxic with sats of 85% on room air and not on home O2.  CT angiogram chest concerning for pulmonary emboli.  Patient placed on IV heparin. Pt acute PE and BLE DVT as well.  Clinical Impression  Pt does well with mobility, however is limited in LE fatigue and weakness due to PE and DVTs. Educated pt on how to monitor this and be cautious when he feels fatigued in his LEs . Perform small intervals of mobility or household work ( 2-3 minutes, then seated recovery break is necessary). Will continue to follow while here. Educated pt also on importance fo O2. I entered room with pt on RA ( nasal canula off to the side) however pt at 83% on RA. 3L placed back on and pt recovered to 95%. Limited distance ambulation also made pt drop to 88% on 3L while ambulation with seated pursed lip breathing recovery in 2 minutes back to 96%.     Follow Up Recommendations Home health PT(maybe for pulmonary rehab program)    Equipment Recommendations  None recommended by PT    Recommendations for Other Services       Precautions / Restrictions        Mobility  Bed Mobility Overal bed mobility: Independent                Transfers Overall transfer level: Modified independent Equipment used: Rolling walker (2 wheeled)                Ambulation/Gait Ambulation/Gait assistance: +2 safety/equipment;Min guard Gait Distance (Feet): 45 Feet(walked twice with seated rest  break in between. Walked a second time at 50 feet.) Assistive device: Rolling walker (2 wheeled) Gait Pattern/deviations: Step-through pattern     General Gait Details: steady, just reports leg get weak and fatigued feeling quickly. WE discussed need for him to do small intervals of activity and mobility int eh house. Has to take breaks as well for O2 to catch up. Oxygen levels dropped to 87-88% with 2 minute recovery period with pursed lip breathing after each small ambulation interval.  Stairs            Wheelchair Mobility    Modified Rankin (Stroke Patients Only)       Balance Overall balance assessment: No apparent balance deficits (not formally assessed)                                           Pertinent Vitals/Pain Pain Assessment: No/denies pain    Home Living Family/patient expects to be discharged to:: Private residence Living Arrangements: Spouse/significant other Available Help at Discharge: Family Type of Home: House Home Access: Stairs to enter Entrance Stairs-Rails: Can reach both Entrance Stairs-Number of Steps: 2-3 Home Layout: Two level;Bed/bath upstairs Home Equipment: Walker - 2 wheels;Bedside commode Additional Comments: pt has a single bed on first floor he could use until he got stronger    Prior Function Level of Independence: Independent  Comments: very independent. States he works on the [employer] to [employer]        Extremity/Trunk Assessment        Lower Extremity Assessment Lower Extremity Assessment: Overall WFL for tasks assessed       Communication   Communication: No difficulties  Cognition Arousal/Alertness: Awake/alert Behavior During Therapy: WFL for tasks assessed/performed Overall Cognitive Status: Within Functional Limits for tasks assessed                                        General Comments General comments (skin integrity, edema,  etc.): utilized RW which pt is not normally used to, so will continue to assess if he needs the RW or not    Exercises Other Exercises Other Exercises: educated on pursed lip breathing for recovery after small bouts of activity. 5xtimes.   Assessment/Plan    PT Assessment Patient needs continued PT services  PT Problem List Decreased activity tolerance;Decreased mobility       PT Treatment Interventions Functional mobility training;Therapeutic activities    PT Goals (Current goals can be found in the Care Plan section)  Acute Rehab PT Goals Patient Stated Goal: to get back to my active lifestyle and home PT Goal Formulation: With patient Time For Goal Achievement: 08/03/18 Potential to Achieve Goals: Good    Frequency Min 3X/week   Barriers to discharge        Co-evaluation               AM-PAC PT "6 Clicks" Mobility  Outcome Measure Help needed turning from your back to your side while in a flat bed without using bedrails?: None Help needed moving from lying on your back to sitting on the side of a flat bed without using bedrails?: None Help needed moving to and from a bed to a chair (including a wheelchair)?: A Little Help needed standing up from a chair using your arms (e.g., wheelchair or bedside chair)?: A Little Help needed to walk in hospital room?: A Little Help needed climbing 3-5 steps with a railing? : A Little 6 Click Score: 20    End of Session Equipment Utilized During Treatment: Gait belt Activity Tolerance: Patient tolerated treatment well Patient left: in bed;with bed alarm set Nurse Communication: Mobility status;Other (comment)(need for O2 for pt NOT to take it off) PT Visit Diagnosis: Muscle weakness (generalized) (M62.81);Other abnormalities of gait and mobility (R26.89)    Time: 1610-9604 PT Time Calculation (min) (ACUTE ONLY): 35 min   Charges:   PT Evaluation $PT Eval Low Complexity: 1 Low PT Treatments $Gait Training: 8-22 mins         Clide Dales, PT Acute Rehabilitation Services Pager: [phone] Office: [phone] 07/20/2018   Clide Dales 07/20/2018, 5:24 PM

## 2018-07-20 NOTE — Progress Notes (Signed)
ANTICOAGULATION CONSULT NOTE   Pharmacy Consult for heparin Indication: acute pulmonary embolus  Allergies  Allergen Reactions  . Cephalexin   . Feldene [Piroxicam] Nausea Only and Other (See Comments)    Makes pt feel loopy    Patient Measurements: weight 82 kg; height 69 inches Height: 5\' 9"  (175.3 cm) Weight: 179 lb 3.7 oz (81.3 kg) IBW/kg (Calculated) : 70.7 Heparin Dosing Weight: 82 kg  Vital Signs: Temp: 99.1 F (37.3 C) (06/14 0326) Temp Source: Oral (06/14 0326) BP: 117/65 (06/14 0400) Pulse Rate: 82 (06/14 0400)  Labs: Recent Labs    07/18/18 1330 07/18/18 1751  07/19/18 0041 07/19/18 0629 07/19/18 0936 07/19/18 1600 07/20/18 0310  HGB 11.6*  --   --  10.2* 11.1*  --   --  10.4*  HCT 37.6*  --   --  33.7* 35.6*  --   --  34.4*  PLT 217  --   --  188 181  --   --  192  LABPROT  --   --   --  15.0  --   --   --   --   INR  --   --   --  1.2  --   --   --   --   HEPARINUNFRC  --   --    < > 0.26*  --  0.52 0.45 0.57  CREATININE 0.80  --   --   --  0.78  --   --  0.72  TROPONINI  --  <0.03  --  <0.03 <0.03  --   --   --    < > = values in this interval not displayed.    Estimated Creatinine Clearance: 76.1 mL/min (by C-G formula based on SCr of 0.72 mg/dL).   Assessment: Patient is a  78 y.o M presented to the ED from PCP office for workup of unexplained weight loss, SOB and elevated LFTs.  Abd CT showed left ureteral calculus and right adrenal myelolipoma.  Chest CTA showed "segmental embolus present in the left lung base" with suspected right heart strain.  To start heparin drip for acute PE.  Today, 6/13  Heparin level continues to be therapeutic on current IV heparin rate of 1500 units/hr  CBC Ok  No reported bleeding  Goal of Therapy:  Heparin level 0.3-0.7 units/ml Monitor platelets by anticoagulation protocol: Yes   Plan:  1) Continue IV Heparin rate at 1500 units/hr 2) Daily CBC and heparin level 3) Monitor for s/s bleeding  Kara Mead 07/20/2018,7:42 AM

## 2018-07-20 NOTE — Progress Notes (Signed)
PROGRESS NOTE    Luis Cisneros  SAY:[id number] DOB: 11-03-1940 DOA: 07/18/2018 PCP: Elby Showers, MD    Brief Narrative:  Patient 78 year old gentleman history of prior pulmonary embolism diagnosed in 2006 after ankle fracture status post 6 months of anticoagulation, hyperlipidemia, hypertension, vitamin D deficiency, depression presented to the ED with worsening shortness of breath x1 month, fatigue, decreased appetite.  COVID-19 test done in the outpatient setting was negative.  Patient noted to be hypoxic with sats of 85% on room air and not on home O2.  CT angiogram chest concerning for pulmonary emboli.  Patient placed on IV heparin.  2D echo, lower extremity Dopplers ordered.   Assessment & Plan:   Principal Problem:   PE (pulmonary thromboembolism) ([place]) Active Problems:   Leg DVT (deep venous thromboembolism), acute, bilateral (HCC)   Hyperlipidemia   Seasonal affective disorder (HCC)   HTN (hypertension)   Depression   Ureteral calculus, left   Hydronephrosis of left kidney   Transaminitis  1 left segmental pulmonary embolus/bilateral DVT(right femoral vein and right proximal profunda vein, left femoral vein and left gastrocnemius vein) Per CT angiogram of the chest.  Patient had presented with a myriad of symptoms including worsening shortness of breath over the past month, generalized fatigue, shortness of breath on minimal exertion and going up stairs, decreased appetite, dizziness.  Patient with no recent surgeries, no recent long car rides or travel, no recent prior history of malignancy.  No family history of DVTs. Patient of note did have a PE in 2006 after a right ankle fracture and was anticoagulated with Lovenox/Coumadin for 6 months which he completed.  Hypercoagulable panel was ordered per ED and pending. Patient also noted on presentation to have systolic blood pressures in the 100s.  Patient with improvement with his blood pressure.  Lower extremity Dopplers positive  for bilateral proximal DVTs.  2D echo negative for right ventricular strain.  Blood pressure improved.  Cardiac enzymes negative x3.  Continue IV heparin for 48 to 72 hours and could likely transition to oral Xarelto if continued improvement tomorrow.  Patient will likely require anticoagulation for life as this is his second clot.  May need outpatient follow-up with hematology.  Continue to wean O2.  Likely home once no longer hypoxic and not requiring oxygen.  Follow.   2.  Hypertension Patient noted on presentation to have systolic blood pressures in the 100s.  Borderline blood pressure likely secondary to problem #1.  Blood pressure has improved.  Saline lock IV fluids.  Continue to hold antihypertensive medications.  Follow.    3.  Depression Stable.  Continue Wellbutrin.   4.  Mild hydronephrosis of the left kidney/left ureteral calculus Patient did have some complaints of intermittent left groin pain.  Patient currently asymptomatic.  Renal function within normal limits.  Will monitor for now.  Outpatient follow-up with urology.  5.  Hyperlipidemia Fasting lipid panel with LDL of 80.  Patient noted to have a transaminitis which is being worked up in the outpatient setting.  Statin has been discontinued.  Outpatient follow-up.    6.  Transaminitis Questionable etiology.  CT abdomen and pelvis done on admission negative for cholelithiasis or biliary dilatation.  Simple left hepatic cyst noted.  Pancreas unremarkable.  Patient with no abdominal symptoms.  Patient noted to have a hep B core antibody positive in January 2020, however repeat acute hepatitis panel with hepatitis B surface antigen and antibody negative.  Statin has been discontinued.  Outpatient follow-up  for further evaluation.   DVT prophylaxis: Heparin Code Status: Full Family Communication: Updated patient. Updated wife via telephone.  Disposition Plan: Transfer to telemetry.  Likely home when no longer hypoxic and not  requiring oxygen and on oral anticoagulation.    Consultants:   None  Procedures:   CT abdomen and pelvis 07/18/2018  CT angio chest 07/18/2018  Chest x-ray 07/04/2018  2D echo 07/19/2018  Lower extremity Dopplers 07/19/2018  Antimicrobials:   None   Subjective: Patient sitting up in bed.  Just finished breakfast.  Denies any chest pain or shortness of breath.  Denies any bleeding.  Patient stating has to keep going to the bathroom every hour.  Asking when he is going to be able to go home.   Objective: Vitals:   07/20/18 0500 07/20/18 0741 07/20/18 0834 07/20/18 0857  BP:  (!) 107/52 115/66   Pulse:  85 89   Resp:  20 16   Temp:    98.7 F (37.1 C)  TempSrc:    Oral  SpO2:  94% 92%   Weight: 81.3 kg     Height:        Intake/Output Summary (Last 24 hours) at 07/20/2018 0929 Last data filed at 07/20/2018 0747 Gross per 24 hour  Intake 2866.21 ml  Output 1705 ml  Net 1161.21 ml   Filed Weights   07/18/18 2302 07/19/18 0500 07/20/18 0500  Weight: 80.6 kg 80 kg 81.3 kg    Examination:  General exam: Appears calm and comfortable  Respiratory system: CTAB.  No wheezes, no crackles, no rhonchi.  Normal respiratory effort.   Cardiovascular system: Regular rate and rhythm no murmurs rubs or gallops.  No JVD.  No lower extremity edema.  Gastrointestinal system: Abdomen is soft, nontender, nondistended, positive bowel sounds.  No rebound.  No guarding.  Central nervous system: Alert and oriented. No focal neurological deficits. Extremities: Symmetric 5 x 5 power. Skin: No rashes, lesions or ulcers Psychiatry: Judgement and insight appear normal. Mood & affect appropriate.     Data Reviewed: I have personally reviewed following labs and imaging studies  CBC: Recent Labs  Lab 07/18/18 1330 07/19/18 0041 07/19/18 0629 07/20/18 0310  WBC 4.6 3.7* 3.8* 3.2*  NEUTROABS 3.2  --   --   --   HGB 11.6* 10.2* 11.1* 10.4*  HCT 37.6* 33.7* 35.6* 34.4*  MCV 86.8 88.0  87.3 88.2  PLT 217 188 181 244   Basic Metabolic Panel: Recent Labs  Lab 07/18/18 1330 07/18/18 1751 07/19/18 0629 07/20/18 0310  NA 132*  --  133* 132*  K 3.5  --  3.5 3.8  CL 100  --  100 104  CO2 23  --  24 24  GLUCOSE 85  --  98 89  BUN 18  --  13 9  CREATININE 0.80  --  0.78 0.72  CALCIUM 8.3*  --  8.0* 7.7*  MG  --  2.1  --   --    GFR: Estimated Creatinine Clearance: 76.1 mL/min (by C-G formula based on SCr of 0.72 mg/dL). Liver Function Tests: Recent Labs  Lab 07/18/18 1330 07/19/18 0629  AST 87* 97*  ALT 51* 51*  ALKPHOS 227* 238*  BILITOT 1.2 0.9  PROT 6.9 6.4*  ALBUMIN 2.4* 2.2*   Recent Labs  Lab 07/18/18 1330  LIPASE 25   No results for input(s): AMMONIA in the last 168 hours. Coagulation Profile: Recent Labs  Lab 07/19/18 0041  INR 1.2   Cardiac  Enzymes: Recent Labs  Lab 07/18/18 1751 07/19/18 0041 07/19/18 0629  TROPONINI <0.03 <0.03 <0.03   BNP (last 3 results) No results for input(s): PROBNP in the last 8760 hours. HbA1C: No results for input(s): HGBA1C in the last 72 hours. CBG: Recent Labs  Lab 07/19/18 0748 07/19/18 2147 07/20/18 0746  GLUCAP 122* 82 78   Lipid Profile: Recent Labs    07/19/18 0041  CHOL 117  HDL 16*  LDLCALC 80  TRIG 104  CHOLHDL 7.3   Thyroid Function Tests: No results for input(s): TSH, T4TOTAL, FREET4, T3FREE, THYROIDAB in the last 72 hours. Anemia Panel: Recent Labs    07/20/18 0310  VITAMINB12 531   Sepsis Labs: No results for input(s): PROCALCITON, LATICACIDVEN in the last 168 hours.  Recent Results (from the past 240 hour(s))  Novel Coronavirus, NAA (hospital order; send-out to ref lab)     Status: None   Collection Time: 07/18/18  4:23 PM   Specimen: Nasopharyngeal Swab; Respiratory  Result Value Ref Range Status   SARS-CoV-2, NAA NOT DETECTED NOT DETECTED Final    Comment: (NOTE) This test was developed and its performance characteristics determined by [employer].  This test has not been FDA cleared or approved. This test has been authorized by FDA under an Emergency Use Authorization (EUA). This test is only authorized for the duration of time the declaration that circumstances exist justifying the authorization of the emergency use of in vitro diagnostic tests for detection of SARS-CoV-2 virus and/or diagnosis of COVID-19 infection under section 564(b)(1) of the Act, 21 U.S.C. 518ACZ-6(S)(0), unless the authorization is terminated or revoked sooner. When diagnostic testing is negative, the possibility of a false negative result should be considered in the context of a patient's recent exposures and the presence of clinical signs and symptoms consistent with COVID-19. An individual without symptoms of COVID-19 and who is not shedding SARS-CoV-2 virus would expect to have a negative (not detected) result in this assay. Performed  At: [facility] [place], [place] [id number] Rush Farmer MD NA:[id number]    [place]  Final    Comment: Performed at [facility] [address] [place], [place] [address]         Radiology Studies: Ct Angio Chest Pe W And/or Wo Contrast  Result Date: 07/18/2018 CLINICAL DATA:  Leg weakness with exertion EXAM: CT ANGIOGRAPHY CHEST WITH CONTRAST TECHNIQUE: Multidetector CT imaging of the chest was performed using the standard protocol during bolus administration of intravenous contrast. Multiplanar CT image reconstructions and MIPs were obtained to evaluate the vascular anatomy. CONTRAST:  12m OMNIPAQUE IOHEXOL 350 MG/ML SOLN COMPARISON:  11/17/2009 FINDINGS: Cardiovascular: Examination for pulmonary embolism is limited by patient and breath motion artifact throughout. Within this limitation, positive examination for pulmonary embolism, with segmental embolus present in the left lung base (series 6, image 53). There is no definite embolus identified  elsewhere. The RV:LV ratio is enlarged, measuring up to 1.5:1. Left coronary artery calcifications. No pericardial effusion. Mediastinum/Nodes: No enlarged mediastinal, hilar, or axillary lymph nodes. Thyroid gland, trachea, and esophagus demonstrate no significant findings. Lungs/Pleura: There is a subpleural heterogeneous opacity of the left lung base distal to embolus, concerning for developing infarction. No pleural effusion or pneumothorax. Upper Abdomen: No acute abnormality. Musculoskeletal: No chest wall abnormality. No acute or significant osseous findings. Review of the MIP images confirms the above findings. IMPRESSION: 1. Examination for pulmonary embolism is limited by patient and breath motion artifact throughout. Within this  limitation, positive examination for pulmonary embolism, with segmental embolus present in the left lung base (series 6, image 53). There is no definite embolus identified elsewhere. 2. There is a subpleural heterogeneous opacity of the left lung base distal to embolus, concerning for developing infarction. 3. The RV:LV ratio is enlarged, measuring up to 1.5:1. This is concerning for right heart strain by criteria in the setting of pulmonary embolism, however there is a relatively small, distal burden of embolus. Correlate with clinical concern for right heart strain and echocardiogram. 4.  Coronary artery disease. These results were called by telephone at the time of interpretation on 07/18/2018 at 3:33 pm to Dr. Virgel Manifold , who verbally acknowledged these results. Electronically Signed   By: Eddie Candle M.D.   On: 07/18/2018 15:36   Ct Abdomen Pelvis W Contrast  Result Date: 07/18/2018 CLINICAL DATA:  Painless jaundice, weight loss. EXAM: CT ABDOMEN AND PELVIS WITH CONTRAST TECHNIQUE: Multidetector CT imaging of the abdomen and pelvis was performed using the standard protocol following bolus administration of intravenous contrast. CONTRAST:  179m OMNIPAQUE IOHEXOL 350  MG/ML SOLN COMPARISON:  CT scan of November 17, 2009. FINDINGS: Lower chest: No acute abnormality. Hepatobiliary: No cholelithiasis or biliary dilatation is noted. Simple left hepatic cyst is noted. Pancreas: Unremarkable. No pancreatic ductal dilatation or surrounding inflammatory changes. Spleen: Normal in size without focal abnormality. Adrenals/Urinary Tract: Left adrenal gland appears normal. Stable calcified right adrenal myelolipoma is noted. Small nonobstructive calculus seen in upper pole calyx of left kidney. Mild left hydronephrosis is noted secondary to 7 mm calculus in proximal left ureter. Urinary bladder is unremarkable. Stomach/Bowel: The stomach appears normal. There is no evidence of bowel obstruction or inflammation. The appendix is not visualized. Vascular/Lymphatic: Aortic atherosclerosis. No enlarged abdominal or pelvic lymph nodes. Reproductive: Mild prostatic enlargement is noted. Other: No abdominal wall hernia or abnormality. No abdominopelvic ascites. Musculoskeletal: Status post left total hip arthroplasty. No acute osseous abnormality is noted. IMPRESSION: Mild left hydronephrosis is noted secondary to 7 mm proximal left ureteral calculus. Stable partially calcified right adrenal myelolipoma is noted. Mild prostatic enlargement. Aortic Atherosclerosis (ICD10-I70.0). Electronically Signed   By: JMarijo ConceptionM.D.   On: 07/18/2018 15:51   Dg Chest Port 1 View  Result Date: 07/20/2018 CLINICAL DATA:  Shortness of breath EXAM: PORTABLE CHEST 1 VIEW COMPARISON:  Jul 04, 2018 FINDINGS: The heart, hila, and mediastinum are normal. No pneumothorax. No nodules or masses. A small left pleural effusion is not excluded on this study. A PA and lateral chest x-ray could better evaluate. IMPRESSION: Question of a small left pleural effusion. A PA and lateral chest x-ray could better evaluate if clinically warranted. Electronically Signed   By: DDorise BullionIII M.D   On: 07/20/2018 08:53   Vas  UK[place]Lower Extremity Venous (dvt)  Result Date: 07/19/2018  Lower Venous Study Indications: Pulmonary embolism.  Performing Technologist: MMaudry MayhewMHA, RDMS, RVT, RDCS  Examination Guidelines: A complete evaluation includes B-mode imaging, spectral Doppler, color Doppler, and power Doppler as needed of all accessible portions of each vessel. Bilateral testing is considered an integral part of a complete examination. Limited examinations for reoccurring indications may be performed as noted.  +---------+---------------+---------+-----------+----------+--------------+  RIGHT     Compressibility Phasicity Spontaneity Properties Summary         +---------+---------------+---------+-----------+----------+--------------+  CFV       Full            No  Yes                    Pulsatile flow  +---------+---------------+---------+-----------+----------+--------------+  SFJ       Full                                                             +---------+---------------+---------+-----------+----------+--------------+  FV Prox   None                      No                     Acute           +---------+---------------+---------+-----------+----------+--------------+  FV Mid    Full                                                             +---------+---------------+---------+-----------+----------+--------------+  FV Distal Full                                                             +---------+---------------+---------+-----------+----------+--------------+  PFV       None                      No                     Acute           +---------+---------------+---------+-----------+----------+--------------+  POP       Full            No        Yes                    Pulsatile flow  +---------+---------------+---------+-----------+----------+--------------+  PTV       Full                                                              +---------+---------------+---------+-----------+----------+--------------+  PERO      Full                                                             +---------+---------------+---------+-----------+----------+--------------+   +---------+---------------+---------+-----------+----------+--------------+  LEFT      Compressibility Phasicity Spontaneity Properties Summary         +---------+---------------+---------+-----------+----------+--------------+  CFV       Full            No        Yes  Pulsatile flow  +---------+---------------+---------+-----------+----------+--------------+  SFJ       Full                                                             +---------+---------------+---------+-----------+----------+--------------+  FV Prox   None                      No                     Acute           +---------+---------------+---------+-----------+----------+--------------+  FV Mid    Full                                                             +---------+---------------+---------+-----------+----------+--------------+  FV Distal None                      No                     Acute           +---------+---------------+---------+-----------+----------+--------------+  PFV       Full                                                             +---------+---------------+---------+-----------+----------+--------------+  POP       Full                                             Pulsatile flow  +---------+---------------+---------+-----------+----------+--------------+  PTV       Full                                                             +---------+---------------+---------+-----------+----------+--------------+  PERO      Full                                                             +---------+---------------+---------+-----------+----------+--------------+  Gastroc   None                      No                     Acute            +---------+---------------+---------+-----------+----------+--------------+     Summary: Right: Findings consistent with acute deep vein thrombosis involving  the right femoral vein, and right proximal profunda vein. No cystic structure found in the popliteal fossa. Left: Findings consistent with acute deep vein thrombosis involving the left femoral vein, and left gastrocnemius vein. No cystic structure found in the popliteal fossa.  Venous flow is pulsatile bilaterally, suggestive of possibly elevated right-sided heart pressure. *See table(s) above for measurements and observations.    Preliminary         Scheduled Meds:  buPROPion  300 mg Oral Daily   Chlorhexidine Gluconate Cloth  6 each Topical Daily   mouth rinse  15 mL Mouth Rinse BID   multivitamin with minerals  1 tablet Oral Daily   senna  1 tablet Oral BID   sodium chloride flush  3 mL Intravenous Q12H   Continuous Infusions:  heparin 1,500 Units/hr (07/20/18 0317)     LOS: 2 days    Time spent: 40 minutes    Irine Seal, MD [facility]  If 7PM-7AM, please contact night-coverage [email or web address] 07/20/2018, 9:29 AM

## 2018-07-21 ENCOUNTER — Encounter: Payer: Self-pay | Admitting: Internal Medicine

## 2018-07-21 LAB — PROTEIN S, TOTAL: Protein S Ag, Total: 143 % (ref 60–150)

## 2018-07-21 LAB — COMPREHENSIVE METABOLIC PANEL
ALT: 43 U/L (ref 0–44)
AST: 82 U/L — ABNORMAL HIGH (ref 15–41)
Albumin: 2 g/dL — ABNORMAL LOW (ref 3.5–5.0)
Alkaline Phosphatase: 218 U/L — ABNORMAL HIGH (ref 38–126)
Anion gap: 5 (ref 5–15)
BUN: 10 mg/dL (ref 8–23)
CO2: 27 mmol/L (ref 22–32)
Calcium: 8 mg/dL — ABNORMAL LOW (ref 8.9–10.3)
Chloride: 100 mmol/L (ref 98–111)
Creatinine, Ser: 0.75 mg/dL (ref 0.61–1.24)
GFR calc Af Amer: >60 mL/min (ref >60)
GFR calc non Af Amer: >60 mL/min (ref >60)
Glucose, Bld: 88 mg/dL (ref 70–99)
Potassium: 3.7 mmol/L (ref 3.5–5.1)
Sodium: 132 mmol/L — ABNORMAL LOW (ref 135–145)
Total Bilirubin: 0.8 mg/dL (ref 0.3–1.2)
Total Protein: 5.9 g/dL — ABNORMAL LOW (ref 6.5–8.1)

## 2018-07-21 LAB — CBC
HCT: 34.1 % — ABNORMAL LOW (ref 39.0–52.0)
Hemoglobin: 10.6 g/dL — ABNORMAL LOW (ref 13.0–17.0)
MCH: 27 pg (ref 26.0–34.0)
MCHC: 31.1 g/dL (ref 30.0–36.0)
MCV: 87 fL (ref 80.0–100.0)
Platelets: 163 10*3/uL (ref 150–400)
RBC: 3.92 MIL/uL — ABNORMAL LOW (ref 4.22–5.81)
RDW: 15.2 % (ref 11.5–15.5)
WBC: 3.6 10*3/uL — ABNORMAL LOW (ref 4.0–10.5)
nRBC: 0 % (ref 0.0–0.2)

## 2018-07-21 LAB — GLUCOSE, CAPILLARY: Glucose-Capillary: 82 mg/dL (ref 70–99)

## 2018-07-21 LAB — PROTEIN C ACTIVITY: Protein C Activity: 78 % (ref 73–180)

## 2018-07-21 LAB — HEPARIN LEVEL (UNFRACTIONATED)
Heparin Unfractionated: 0.81 IU/mL — ABNORMAL HIGH (ref 0.30–0.70)
Heparin Unfractionated: 0.51 IU/mL (ref 0.30–0.70)

## 2018-07-21 LAB — CARDIOLIPIN ANTIBODIES, IGG, IGM, IGA
Anticardiolipin IgA: <9 APL U/mL (ref 0–11)
Anticardiolipin IgG: <9 GPL U/mL (ref 0–14)
Anticardiolipin IgM: <9 MPL U/mL (ref 0–12)

## 2018-07-21 LAB — PROTEIN S ACTIVITY: Protein S Activity: 37 % — ABNORMAL LOW (ref 63–140)

## 2018-07-21 LAB — FOLATE RBC
Folate, Hemolysate: 555 ng/mL
Folate, RBC: 1740 ng/mL (ref >498)
Hematocrit: 31.9 % — ABNORMAL LOW (ref 37.5–51.0)

## 2018-07-21 LAB — HIV ANTIBODY (ROUTINE TESTING W REFLEX): HIV Screen 4th Generation wRfx: NONREACTIVE

## 2018-07-21 MED ORDER — RIVAROXABAN 15 MG PO TABS
15.0000 mg | ORAL_TABLET | Freq: Two times a day (BID) | ORAL | Status: DC
  Administered 2018-07-21 – 2018-07-22 (×2): 15 mg via ORAL
  Filled 2018-07-21 (×2): qty 1

## 2018-07-21 MED ORDER — POTASSIUM CHLORIDE 20 MEQ PO PACK
40.0000 meq | PACK | Freq: Once | ORAL | Status: AC
  Administered 2018-07-21: 40 meq via ORAL
  Filled 2018-07-21: qty 2

## 2018-07-21 MED ORDER — RIVAROXABAN 20 MG PO TABS: 20.0000 mg | ORAL_TABLET | Freq: Every day | ORAL | Status: DC

## 2018-07-21 MED ORDER — FUROSEMIDE 10 MG/ML IJ SOLN
20.0000 mg | Freq: Once | INTRAMUSCULAR | Status: AC
  Administered 2018-07-21: 20 mg via INTRAVENOUS
  Filled 2018-07-21: qty 2

## 2018-07-21 NOTE — Care Management Important Message (Signed)
Important Message  Patient Details IM Letter given to Cookie McGibboney RN to present to the Patient Name: Luis Cisneros MRN: [id number] Date of Birth: 1940/05/09   Medicare Important Message Given:  Yes    Kerin Salen 07/21/2018, 12:24 PM

## 2018-07-21 NOTE — Progress Notes (Signed)
SATURATION QUALIFICATIONS: (This note is used to comply with regulatory documentation for home oxygen) Patient Saturations on Room Air at Rest 86-89  Patient Saturations on Room Air while Ambulating = 89-93%  Patient Saturations on 2 Liters of oxygen while Ambulating   87-94%  Please briefly explain why patient needs home oxygen:

## 2018-07-21 NOTE — TOC Progression Note (Signed)
Transition of Care [facility]) - Progression Note    Patient Details  Name: Luis Cisneros MRN: [id number] Date of Birth: August 11, 1940  [facility] CM/SW Contact  Purcell Mouton, RN Phone Number: 07/21/2018, 3:50 PM  Clinical Narrative:  Pt admitted with pulmonary thromboembolism, DVT bilateral,             Expected Discharge Plan and Services    Pt will discharge home with [facility] for HHPT.        Expected Discharge Date: 07/20/18                                     Social Determinants of Health (SDOH) Interventions    Readmission Risk Interventions No flowsheet data found.

## 2018-07-21 NOTE — Progress Notes (Signed)
PROGRESS NOTE    Luis Cisneros  PYP:[id number] DOB: [birth year] DOA: 07/18/2018 PCP: Elby Showers, MD    Brief Narrative:  Patient 78 year old gentleman history of prior pulmonary embolism diagnosed in 2006 after ankle fracture status post 6 months of anticoagulation, hyperlipidemia, hypertension, vitamin D deficiency, depression presented to the ED with worsening shortness of breath x1 month, fatigue, decreased appetite.  COVID-19 test done in the outpatient setting was negative.  Patient noted to be hypoxic with sats of 85% on room air and not on home O2.  CT angiogram chest concerning for pulmonary emboli.  Patient placed on IV heparin.  2D echo, lower extremity Dopplers ordered.   Assessment & Plan:   Principal Problem:   PE (pulmonary thromboembolism) ([place]) Active Problems:   Leg DVT (deep venous thromboembolism), acute, bilateral (HCC)   Hyperlipidemia   Seasonal affective disorder (HCC)   HTN (hypertension)   Depression   Ureteral calculus, left   Hydronephrosis of left kidney   Transaminitis  1 left segmental pulmonary embolus/bilateral DVT(right femoral vein and right proximal profunda vein, left femoral vein and left gastrocnemius vein) Per CT angiogram of the chest.  Patient had presented with a myriad of symptoms including worsening shortness of breath over the past month, generalized fatigue, shortness of breath on minimal exertion and going up stairs, decreased appetite, dizziness.  Patient with no recent surgeries, no recent long car rides or travel, no recent prior history of malignancy.  No family history of DVTs. Patient of note did have a PE in 2006 after a right ankle fracture and was anticoagulated with Lovenox/Coumadin for 6 months which he completed.  Hypercoagulable panel was ordered per ED and pending. Patient also noted on presentation to have systolic blood pressures in the 100s.  Patient with improvement with his blood pressure.  Lower extremity Dopplers positive  for bilateral proximal DVTs.  2D echo negative for right ventricular strain.  Blood pressure improved.  Cardiac enzymes negative x3.  Patient still with sats of 84% on room air.  Improving clinically slowly.  We will give a dose of Lasix 20 mg IV x1 as patient noted to have some bibasilar crackles.  Continue IV heparin for 48 to 72 hours and could likely transition to oral Xarelto this evening.  Patient will likely require anticoagulation for life as this is his second clot.  May need outpatient follow-up with hematology.  Continue to wean O2.  Likely home once no longer hypoxic and not requiring oxygen.  Follow.   2.  Hypertension Patient noted on presentation to have systolic blood pressures in the 100s.  Borderline blood pressure likely secondary to problem #1.  Blood pressure has improved.  Saline lock IV fluids.  Continue to hold antihypertensive medications.  Follow.    3.  Depression Stable.  Continue Wellbutrin.   4.  Mild hydronephrosis of the left kidney/left ureteral calculus Patient did have some complaints of intermittent left groin pain.  Patient currently asymptomatic.  Renal function within normal limits.  Will monitor for now.  Outpatient follow-up with urology.  5.  Hyperlipidemia Fasting lipid panel with LDL of 80.  Patient noted to have a transaminitis which is being worked up in the outpatient setting.  Statin has been discontinued.  Outpatient follow-up.    6.  Transaminitis Questionable etiology.  CT abdomen and pelvis done on admission negative for cholelithiasis or biliary dilatation.  Simple left hepatic cyst noted.  Pancreas unremarkable.  Patient with no abdominal symptoms.  LFTs trending  down.  Patient noted to have a hep B core antibody positive in January 2020, however repeat acute hepatitis panel with hepatitis B surface antigen and antibody negative.  Statin has been discontinued.  Outpatient follow-up for further evaluation.   DVT prophylaxis: Heparin Code  Status: Full Family Communication: Updated patient. Updated wife via telephone.  Disposition Plan: Likely home when no longer hypoxic and not requiring oxygen and on oral anticoagulation.    Consultants:   None  Procedures:   CT abdomen and pelvis 07/18/2018  CT angio chest 07/18/2018  Chest x-ray 07/04/2018  2D echo 07/19/2018  Lower extremity Dopplers 07/19/2018  Antimicrobials:   None   Subjective: Patient sitting up in chair.  Denies any shortness of breath.  Denies any chest pain.  No bleeding.  Sitting up in bed.  Just finished breakfast.  Denies any chest pain or shortness of breath.  Denies any bleeding.  Patient stating has to keep going to the bathroom every hour.  Asking when he is going to be able to go home.   Objective: Vitals:   07/20/18 1714 07/20/18 2016 07/21/18 0509 07/21/18 1437  BP:  113/72 104/65 121/82  Pulse:  88 87 89  Resp:  _0 Temp:  98.5 F (36.9 C) 98 F (36.7 C) 99.2 F (37.3 C)  TempSrc:  Oral Oral Oral  SpO2: 95% 96% 97% 93%  Weight:   83.1 kg   Height:        Intake/Output Summary (Last 24 hours) at 07/21/2018 1556 Last data filed at 07/21/2018 1445 Gross per 24 hour  Intake 573.5 ml  Output 1350 ml  Net -776.5 ml   Filed Weights   07/19/18 0500 07/20/18 0500 07/21/18 0509  Weight: 80 kg 81.3 kg 83.1 kg    Examination:  General exam: Appears calm and comfortable  Respiratory system: Some bibasilar crackles otherwise clear.  No wheezing.  No rhonchi.  Speaking in full sentences.  Normal respiratory effort. Cardiovascular system: RRR no murmurs rubs or gallops.  No JVD.  No lower extremity edema.  Gastrointestinal system: Abdomen is soft, nontender, nondistended, positive bowel sounds.  No rebound.  No guarding.  Central nervous system: Alert and oriented. No focal neurological deficits. Extremities: Symmetric 5 x 5 power. Skin: No rashes, lesions or ulcers Psychiatry: Judgement and insight appear normal. Mood & affect  appropriate.     Data Reviewed: I have personally reviewed following labs and imaging studies  CBC: Recent Labs  Lab 07/18/18 1330 07/19/18 0041 07/19/18 0629 07/20/18 0310 07/21/18 0431  WBC 4.6 3.7* 3.8* 3.2* 3.6*  NEUTROABS 3.2  --   --   --   --   HGB 11.6* 10.2* 11.1* 10.4* 10.6*  HCT 37.6* 33.7* 35.6* 34.4*   31.9* 34.1*  MCV 86.8 88.0 87.3 88.2 87.0  PLT 217 188 181 192 517   Basic Metabolic Panel: Recent Labs  Lab 07/18/18 1330 07/18/18 1751 07/19/18 0629 07/20/18 0310 07/21/18 0431  NA 132*  --  133* 132* 132*  K 3.5  --  3.5 3.8 3.7  CL 100  --  100 104 100  CO2 23  --  _1 GLUCOSE 85  --  98 89 88  BUN 18  --  _2 CREATININE 0.80  --  0.78 0.72 0.75  CALCIUM 8.3*  --  8.0* 7.7* 8.0*  MG  --  2.1  --   --   --    GFR: Estimated  Creatinine Clearance: 76.1 mL/min (by C-G formula based on SCr of 0.75 mg/dL). Liver Function Tests: Recent Labs  Lab 07/18/18 1330 07/19/18 0629 07/21/18 0431  AST 87* 97* 82*  ALT 51* 51* 43  ALKPHOS 227* 238* 218*  BILITOT 1.2 0.9 0.8  PROT 6.9 6.4* 5.9*  ALBUMIN 2.4* 2.2* 2.0*   Recent Labs  Lab 07/18/18 1330  LIPASE 25   No results for input(s): AMMONIA in the last 168 hours. Coagulation Profile: Recent Labs  Lab 07/19/18 0041  INR 1.2   Cardiac Enzymes: Recent Labs  Lab 07/18/18 1751 07/19/18 0041 07/19/18 0629  TROPONINI <0.03 <0.03 <0.03   BNP (last 3 results) No results for input(s): PROBNP in the last 8760 hours. HbA1C: No results for input(s): HGBA1C in the last 72 hours. CBG: Recent Labs  Lab 07/19/18 0748 07/19/18 2147 07/20/18 0746 07/21/18 0817  GLUCAP 122* 82 78 82   Lipid Profile: Recent Labs    07/19/18 0041  CHOL 117  HDL 16*  LDLCALC 80  TRIG 104  CHOLHDL 7.3   Thyroid Function Tests: No results for input(s): TSH, T4TOTAL, FREET4, T3FREE, THYROIDAB in the last 72 hours. Anemia Panel: Recent Labs    07/20/18 0310  VITAMINB12 531   Sepsis Labs: No  results for input(s): PROCALCITON, LATICACIDVEN in the last 168 hours.  Recent Results (from the past 240 hour(s))  Novel Coronavirus, NAA (hospital order; send-out to ref lab)     Status: None   Collection Time: 07/18/18  4:23 PM   Specimen: Nasopharyngeal Swab; Respiratory  Result Value Ref Range Status   SARS-CoV-2, NAA NOT DETECTED NOT DETECTED Final    Comment: (NOTE) This test was developed and its performance characteristics determined by [employer]. This test has not been FDA cleared or approved. This test has been authorized by FDA under an Emergency Use Authorization (EUA). This test is only authorized for the duration of time the declaration that circumstances exist justifying the authorization of the emergency use of in vitro diagnostic tests for detection of SARS-CoV-2 virus and/or diagnosis of COVID-19 infection under section 564(b)(1) of the Act, 21 U.S.C. 622QJF-3(L)(4), unless the authorization is terminated or revoked sooner. When diagnostic testing is negative, the possibility of a false negative result should be considered in the context of a patient's recent exposures and the presence of clinical signs and symptoms consistent with COVID-19. An individual without symptoms of COVID-19 and who is not shedding SARS-CoV-2 virus would expect to have a negative (not detected) result in this assay. Performed  At: [facility] [place], [place] [id number] Rush Farmer MD TD:[id number]    [place]  Final    Comment: Performed at [facility] [address], [place], [place] [address]  Urine culture     Status: None   Collection Time: 07/18/18 10:01 PM   Specimen: Urine, Random  Result Value Ref Range Status   Specimen Description   Final    URINE, RANDOM Performed at [facility] [address], [place], [place] [address]    Special Requests   Final    NONE Performed  at [facility], [place] [address], [place], [place] [address]    Culture   Final    NO GROWTH Performed at [facility], [place] [address] [place], [place] [address]    Report Status 07/20/2018 FINAL  Final         Radiology Studies: Dg Chest Port 1  View  Result Date: 07/20/2018 CLINICAL DATA:  Shortness of breath EXAM: PORTABLE CHEST 1 VIEW COMPARISON:  Jul 04, 2018 FINDINGS: The heart, hila, and mediastinum are normal. No pneumothorax. No nodules or masses. A small left pleural effusion is not excluded on this study. A PA and lateral chest x-ray could better evaluate. IMPRESSION: Question of a small left pleural effusion. A PA and lateral chest x-ray could better evaluate if clinically warranted. Electronically Signed   By: Dorise Bullion III M.D   On: 07/20/2018 08:53        Scheduled Meds:  buPROPion  300 mg Oral Daily   Chlorhexidine Gluconate Cloth  6 each Topical Daily   mouth rinse  15 mL Mouth Rinse BID   multivitamin with minerals  1 tablet Oral Daily   potassium chloride  40 mEq Oral Once   senna  1 tablet Oral BID   sodium chloride flush  3 mL Intravenous Q12H   Continuous Infusions:  heparin 1,350 Units/hr (07/21/18 1434)     LOS: 3 days    Time spent: 40 minutes    Irine Seal, MD [facility]  If 7PM-7AM, please contact night-coverage [email or web address] 07/21/2018, 3:56 PM

## 2018-07-21 NOTE — Discharge Instructions (Signed)
Information on my medicine - XARELTO® (rivaroxaban) ° °This medication education was reviewed with me or my healthcare representative as part of my discharge preparation.  The pharmacist that spoke with me during my hospital stay was:   ° °WHY WAS XARELTO® PRESCRIBED FOR YOU? °Xarelto® was prescribed to treat blood clots that may have been found in the veins of your legs (deep vein thrombosis) or in your lungs (pulmonary embolism) and to reduce the risk of them occurring again. ° °What do you need to know about Xarelto®? °The starting dose is one 15 mg tablet taken TWICE daily with food for the FIRST 21 DAYS then on 08/11/18 the dose is changed to one 20 mg tablet taken ONCE A DAY with your evening meal. ° °DO NOT stop taking Xarelto® without talking to the health care provider who prescribed the medication.  Refill your prescription for 20 mg tablets before you run out. ° °After discharge, you should have regular check-up appointments with your healthcare provider that is prescribing your Xarelto®.  In the future your dose may need to be changed if your kidney function changes by a significant amount. ° °What do you do if you miss a dose? °If you are taking Xarelto® TWICE DAILY and you miss a dose, take it as soon as you remember. You may take two 15 mg tablets (total 30 mg) at the same time then resume your regularly scheduled 15 mg twice daily the next day. ° °If you are taking Xarelto® ONCE DAILY and you miss a dose, take it as soon as you remember on the same day then continue your regularly scheduled once daily regimen the next day. Do not take two doses of Xarelto® at the same time.  ° °Important Safety Information °Xarelto® is a blood thinner medicine that can cause bleeding. You should call your healthcare provider right away if you experience any of the following: °? Bleeding from an injury or your nose that does not stop. °? Unusual colored urine (red or dark brown) or unusual colored stools (red or  black). °? Unusual bruising for unknown reasons. °? A serious fall or if you hit your head (even if there is no bleeding). ° °Some medicines may interact with Xarelto® and might increase your risk of bleeding while on Xarelto®. To help avoid this, consult your healthcare provider or pharmacist prior to using any new prescription or non-prescription medications, including herbals, vitamins, non-steroidal anti-inflammatory drugs (NSAIDs) and supplements. ° °This website has more information on Xarelto®: www.xarelto.com. ° °

## 2018-07-21 NOTE — Progress Notes (Signed)
ANTICOAGULATION CONSULT NOTE - Initial Consult  Pharmacy Consult for heparin --> Rivaroxaban Indication: pulmonary embolus  Allergies  Allergen Reactions  . Cephalexin   . Feldene [Piroxicam] Nausea Only and Other (See Comments)    Makes pt feel loopy    Patient Measurements: Height: 5\' 9"  (175.3 cm) Weight: 183 lb 3.2 oz (83.1 kg) IBW/kg (Calculated) : 70.7  Vital Signs: Temp: 99.2 F (37.3 C) (06/15 1437) Temp Source: Oral (06/15 1437) BP: 121/82 (06/15 1437) Pulse Rate: 89 (06/15 1437)  Labs: Recent Labs    07/18/18 1751  07/19/18 0041 07/19/18 0629  07/20/18 0310 07/21/18 0431 07/21/18 1552  HGB  --    < > 10.2* 11.1*  --  10.4* 10.6*  --   HCT  --    < > 33.7* 35.6*  --  34.4*  31.9* 34.1*  --   PLT  --    < > 188 181  --  192 163  --   LABPROT  --   --  15.0  --   --   --   --   --   INR  --   --  1.2  --   --   --   --   --   HEPARINUNFRC  --   --  0.26*  --    < > 0.57 0.81* 0.51  CREATININE  --   --   --  0.78  --  0.72 0.75  --   TROPONINI <0.03  --  <0.03 <0.03  --   --   --   --    < > = values in this interval not displayed.    Estimated Creatinine Clearance: 76.1 mL/min (by C-G formula based on SCr of 0.75 mg/dL).   Medical History: Past Medical History:  Diagnosis Date  . Ankle fracture 2006   right  . Arthritis   . Depression   . Hyperlipidemia   . Hypertension   . Pulmonary embolism ([place])   . Vitamin D deficiency    Assessment: Patient admitted with worsening SOB and diagnosed with acute PE and bilateral DVT. Patient is currently on heparin infusion, to be converted to rivaroxaban this evening.  Today, 07/21/18  Hgb 10.6 - low but stable  Plt - 163, WNL  HL = 0.51 therapeutic on heparin infusion of 1350 units/hr. No bleeding documented. Tolerating per RN.  Pharmacy consulted to convert anticoagulation from heparin drip to rivaroxaban at 1800 this evening.   Goal of Therapy:  Monitor platelets by anticoagulation protocol: Yes   Plan:   Discontinue heparin drip at time of rivaroxaban initiation  Rivaroxaban 15 mg PO BID with meals x 21 days followed by rivaroxaban 20 mg PO daily with supper  CBC with AM labs tomorrow  Pt will need manufacturer coupon and medication counseling prior to discharge  Lenis Noon, PharmD 07/21/2018,5:45 PM

## 2018-07-21 NOTE — Progress Notes (Signed)
ANTICOAGULATION CONSULT NOTE   Pharmacy Consult for heparin Indication: acute pulmonary embolus  Allergies  Allergen Reactions  . Cephalexin   . Feldene [Piroxicam] Nausea Only and Other (See Comments)    Makes pt feel loopy    Patient Measurements: weight 82 kg; height 69 inches Height: 5\' 9"  (175.3 cm) Weight: 183 lb 3.2 oz (83.1 kg) IBW/kg (Calculated) : 70.7 Heparin Dosing Weight: 82 kg  Vital Signs: Temp: 98 F (36.7 C) (06/15 0509) Temp Source: Oral (06/15 0509) BP: 104/65 (06/15 0509) Pulse Rate: 87 (06/15 0509)  Labs: Recent Labs    07/18/18 1751 07/19/18 0041 07/19/18 0629  07/19/18 1600 07/20/18 0310 07/21/18 0431  HGB  --  10.2* 11.1*  --   --  10.4* 10.6*  HCT  --  33.7* 35.6*  --   --  34.4* 34.1*  PLT  --  188 181  --   --  192 163  LABPROT  --  15.0  --   --   --   --   --   INR  --  1.2  --   --   --   --   --   HEPARINUNFRC  --  0.26*  --    < > 0.45 0.57 0.81*  CREATININE  --   --  0.78  --   --  0.72 0.75  TROPONINI <0.03 <0.03 <0.03  --   --   --   --    < > = values in this interval not displayed.    Estimated Creatinine Clearance: 76.1 mL/min (by C-G formula based on SCr of 0.75 mg/dL).   Assessment: Patient is a  78 y.o M presented to the ED from PCP office for workup of unexplained weight loss, SOB and elevated LFTs.  Abd CT showed left ureteral calculus and right adrenal myelolipoma.  Chest CTA showed "segmental embolus present in the left lung base" with suspected right heart strain.  To start heparin drip for acute PE.  Today, 6/13  Heparin level supra-therapeutic on current IV heparin rate of 1500 units/hr  CBC Ok  No reported bleeding or infusion related issues  Goal of Therapy:  Heparin level 0.3-0.7 units/ml Monitor platelets by anticoagulation protocol: Yes   Plan:  1) Decrease IV Heparin rate to 1350 units/hr 2) Check 8h heparin level 3) Daily CBC and heparin level while on heparin 4) Monitor for s/sx bleeding 5)  Noted plans to switch to Xarelto 15mg  PO BID x 3 weeks then 20mg  PO daily with food possibly later today.   Biagio Borg 07/21/2018,6:58 AM

## 2018-07-21 NOTE — Evaluation (Signed)
Occupational Therapy Evaluation Patient Details Name: Luis Cisneros MRN: [id number] DOB: 14-Aug-1940 Today's Date: 07/21/2018    History of Present Illness Patient 78 year old gentleman history of prior pulmonary embolism diagnosed in 2006 after ankle fracture status post 6 months of anticoagulation, hyperlipidemia, hypertension, vitamin D deficiency, depression presented to the ED with worsening shortness of breath x1 month, fatigue, decreased appetite.  COVID-19 test done in the outpatient setting was negative.  Patient noted to be hypoxic with sats of 85% on room air and not on home O2.  CT angiogram chest concerning for pulmonary emboli.  Patient placed on IV heparin. Pt acute PE and BLE DVT as well.   Clinical Impression   This 78 y/o male presents with the above. PTA pt reports independence with ADL and functional mobility. Pt presents supine in bed without oxygen donned. Checked SpO2 and pt's O2 sats hovering around 84%; reapplied 3L O2 and sats increasing to >93%, maintaining in this range throughout remainder of session. Pt requiring close minguard assist for functional transfers using RW; he currently requires setup-minguard assist for seated UB ADL; minA for LB ADL. Pt will benefit from continued acute OT services and recommend follow up [place] services to maximize his safety and independence with ADL and mobility. Will follow.     Follow Up Recommendations  Home health OT;Supervision/Assistance - 24 hour    Equipment Recommendations  None recommended by OT           Precautions / Restrictions Precautions Precautions: Fall Precaution Comments: monitor O2 sats Restrictions Weight Bearing Restrictions: No      Mobility Bed Mobility Overal bed mobility: Needs Assistance Bed Mobility: Supine to Sit     Supine to sit: Min assist     General bed mobility comments: pt seeking HHA for trunk elevation  Transfers Overall transfer level: Needs assistance Equipment used: Rolling  walker (2 wheeled) Transfers: Sit to/from [facility] Sit to Stand: Min assist Stand pivot transfers: Min guard       General transfer comment: boosting assist from EOB; close minguard for lines, safety and balance during transition to recliner using RW                                               ADL either performed or assessed with clinical judgement   ADL Overall ADL's : Needs assistance/impaired Eating/Feeding: Modified independent;Sitting   Grooming: Set up;Sitting;Wash/dry face;Oral care Grooming Details (indicate cue type and reason): seated in recliner Upper Body Bathing: Min guard;Set up;Sitting   Lower Body Bathing: Minimal assistance;Sit to/from stand   Upper Body Dressing : Set up;Min guard;Sitting   Lower Body Dressing: Sit to/from stand;Minimal assistance Lower Body Dressing Details (indicate cue type and reason): close minguard standing balance Toilet Transfer: Stand-pivot;Min Forensic psychologist Details (indicate cue type and reason): simulated via transfer to recliner; close minguard for safety Toileting- Clothing Manipulation and Hygiene: Minimal assistance;Sit to/from stand       Functional mobility during ADLs: Min guard;Rolling walker                           Pertinent Vitals/Pain Pain Assessment: No/denies pain     Hand Dominance     Extremity/Trunk Assessment Upper Extremity Assessment Upper Extremity Assessment: Generalized weakness   Lower Extremity Assessment Lower Extremity Assessment: Defer to PT evaluation  Communication Communication Communication: No difficulties   Cognition Arousal/Alertness: Awake/alert Behavior During Therapy: WFL for tasks assessed/performed Overall Cognitive Status: Within Functional Limits for tasks assessed                                     General Comments       Exercises Exercises: Other exercises Other Exercises Other  Exercises: pt return demonstrating education of pursed lip breathing techniques during session   Shoulder Instructions      Home Living Family/patient expects to be discharged to:: Private residence Living Arrangements: Spouse/significant other Available Help at Discharge: Family Type of Home: House Home Access: Stairs to enter Technical brewer of Steps: 2-3 Entrance Stairs-Rails: Can reach both Home Layout: Two level;Bed/bath upstairs Alternate Level Stairs-Number of Steps: flight       Bathroom Toilet: Standard     Home Equipment: Environmental consultant - 2 wheels;Bedside commode   Additional Comments: pt has a single bed on first floor he could use until he got stronger      Prior Functioning/Environment Level of Independence: Independent        Comments: very independent. States he works on the [employer] to [employer]        OT Problem List: Decreased strength;Decreased range of motion;Decreased activity tolerance;Impaired balance (sitting and/or standing);Decreased safety awareness;Decreased knowledge of use of DME or AE;Cardiopulmonary status limiting activity      OT Treatment/Interventions: Self-care/ADL training;Energy conservation;DME and/or AE instruction;Therapeutic activities;Patient/family education;Balance training;Therapeutic exercise    OT Goals(Current goals can be found in the care plan section) Acute Rehab OT Goals Patient Stated Goal: to get back to my active lifestyle and home OT Goal Formulation: With patient Time For Goal Achievement: 08/04/18 Potential to Achieve Goals: Good  OT Frequency: Min 2X/week   Barriers to D/C:            Co-evaluation              AM-PAC OT "6 Clicks" Daily Activity     Outcome Measure Help from another person eating meals?: None Help from another person taking care of personal grooming?: None Help from another person toileting, which includes using toliet, bedpan, or urinal?: A Little Help from  another person bathing (including washing, rinsing, drying)?: A Little Help from another person to put on and taking off regular upper body clothing?: None Help from another person to put on and taking off regular lower body clothing?: A Little 6 Click Score: 21   End of Session Equipment Utilized During Treatment: Rolling walker;Oxygen Nurse Communication: Mobility status  Activity Tolerance: Patient tolerated treatment well Patient left: in chair;with call bell/phone within reach;with chair alarm set  OT Visit Diagnosis: Muscle weakness (generalized) (M62.81);Unsteadiness on feet (R26.81)                Time: 8242-3536 OT Time Calculation (min): 24 min Charges:  OT General Charges $OT Visit: 1 Visit OT Evaluation $OT Eval Moderate Complexity: 1 Mod OT Treatments $Self Care/Home Management : 8-22 mins  Lou Cal, OT [facility] Pager [phone] Office [place] 07/21/2018, 12:50 PM

## 2018-07-22 LAB — COMPREHENSIVE METABOLIC PANEL
ALT: 45 U/L — ABNORMAL HIGH (ref 0–44)
AST: 88 U/L — ABNORMAL HIGH (ref 15–41)
Albumin: 2.2 g/dL — ABNORMAL LOW (ref 3.5–5.0)
Alkaline Phosphatase: 210 U/L — ABNORMAL HIGH (ref 38–126)
Anion gap: 9 (ref 5–15)
BUN: 12 mg/dL (ref 8–23)
CO2: 24 mmol/L (ref 22–32)
Calcium: 8 mg/dL — ABNORMAL LOW (ref 8.9–10.3)
Chloride: 99 mmol/L (ref 98–111)
Creatinine, Ser: 0.76 mg/dL (ref 0.61–1.24)
GFR calc Af Amer: >60 mL/min (ref >60)
GFR calc non Af Amer: >60 mL/min (ref >60)
Glucose, Bld: 91 mg/dL (ref 70–99)
Potassium: 3.8 mmol/L (ref 3.5–5.1)
Sodium: 132 mmol/L — ABNORMAL LOW (ref 135–145)
Total Bilirubin: 0.8 mg/dL (ref 0.3–1.2)
Total Protein: 6.1 g/dL — ABNORMAL LOW (ref 6.5–8.1)

## 2018-07-22 LAB — CBC
HCT: 34.7 % — ABNORMAL LOW (ref 39.0–52.0)
Hemoglobin: 10.6 g/dL — ABNORMAL LOW (ref 13.0–17.0)
MCH: 26.4 pg (ref 26.0–34.0)
MCHC: 30.5 g/dL (ref 30.0–36.0)
MCV: 86.5 fL (ref 80.0–100.0)
Platelets: 171 10*3/uL (ref 150–400)
RBC: 4.01 MIL/uL — ABNORMAL LOW (ref 4.22–5.81)
RDW: 15.2 % (ref 11.5–15.5)
WBC: 3.7 10*3/uL — ABNORMAL LOW (ref 4.0–10.5)
nRBC: 0 % (ref 0.0–0.2)

## 2018-07-22 LAB — LUPUS ANTICOAGULANT PANEL
DRVVT: 55.2 s — ABNORMAL HIGH (ref 0.0–47.0)
PTT Lupus Anticoagulant: 46.4 s (ref 0.0–51.9)

## 2018-07-22 LAB — GLUCOSE, CAPILLARY: Glucose-Capillary: 113 mg/dL — ABNORMAL HIGH (ref 70–99)

## 2018-07-22 LAB — DRVVT MIX: dRVVT Mix: 43.5 s (ref 0.0–47.0)

## 2018-07-22 MED ORDER — SENNA 8.6 MG PO TABS: 1.0000 | ORAL_TABLET | Freq: Every day | ORAL | 0 refills | Status: AC

## 2018-07-22 MED ORDER — RIVAROXABAN (XARELTO) VTE STARTER PACK (15 & 20 MG): ORAL_TABLET | ORAL | 0 refills | Status: AC

## 2018-07-22 MED ORDER — HYDROCORTISONE 1 % EX LOTN: TOPICAL_LOTION | Freq: Two times a day (BID) | CUTANEOUS | 0 refills | Status: DC

## 2018-07-22 MED ORDER — HYDROCORTISONE 1 % EX LOTN
TOPICAL_LOTION | Freq: Two times a day (BID) | CUTANEOUS | Status: DC
  Filled 2018-07-22: qty 118

## 2018-07-22 NOTE — Discharge Summary (Signed)
Physician Discharge Summary  Luis Cisneros:[id number] DOB: [birth year] DOA: 07/18/2018  PCP: Elby Showers, MD  Admit date: 07/18/2018 Discharge date: 07/22/2018  Time spent: 60 minutes  Recommendations for Outpatient Follow-up:  1. Follow-up with Elby Showers, MD in 1 to 2 weeks.  On follow-up hypercoagulable panel which was done during the hospitalization will need to be followed up upon.  Patient will need a comprehensive metabolic profile done to follow-up on electrolytes and renal function and LFTs, as well as a CBC.  Patient will need repeat ambulatory sats in the outpatient setting to see if there is improvement with his hypoxia and to see whether patient needs ongoing home O2.   Discharge Diagnoses:  Principal Problem:   PE (pulmonary thromboembolism) ([place]) Active Problems:   Leg DVT (deep venous thromboembolism), acute, bilateral (HCC)   Hyperlipidemia   Seasonal affective disorder (HCC)   HTN (hypertension)   Depression   Ureteral calculus, left   Hydronephrosis of left kidney   Transaminitis   Discharge Condition: Stable and improved  Diet recommendation: Heart healthy  Filed Weights   07/20/18 0500 07/21/18 0509 07/22/18 0500  Weight: 81.3 kg 83.1 kg 78.7 kg    History of present illness:  Luis Cisneros is a 78 y.o. male with medical history significant of prior pulmonary embolism diagnosed in 2006 after an ankle fracture status post 6 months of anticoagulation, hyperlipidemia, hypertension, vitamin D deficiency, depression presenting to the ED with a one-month history of shortness of breath, fatigue, decreased appetite.  Per wife patient's shortness of breath has worsened over the past 2 weeks.  Patient stated symptoms have been in the process of being evaluated per his PCP and had a COVID-19 test which was done which was negative.  Patient denies any fevers, no chills, no chest pain, no diarrhea, no syncope, no cough, no nausea, no vomiting, no dysuria, no melena,  no hematemesis, no hematochezia.  Patient does endorse decreased oral intake, decreased appetite, increased fatigue, some recent dizzy spells, worsening shortness of breath over the past 2 weeks per wife.  Patient also endorses weakness in his bilateral thighs.  Patient did state that for example he comes down early in the mornings to make coffee for him and his wife and on his way back upstairs has to stop 2-3 times due to shortness of breath on exertion and weakness.  Patient also with some complaints of left groin pain which has been intermittent in nature and currently asymptomatic.  ED Course: Patient seen in the ED noted to have sats of 85% on room air.  Patient also noted to have a blood pressure of 100/60 on presentation to the ED with a respiratory rate of 21 and a heart rate ranging from 91-100.  Comprehensive metabolic profile obtained with a sodium of 132 calcium of 8.3, albumin of 2.4, AST of 87, ALT of 51 otherwise was within normal limits.  CBC with a hemoglobin of 11.6 otherwise was within normal limits.  SARS COVID-19 was ordered and currently pending.  Chest x-ray done was unremarkable.  CT abdomen and pelvis done with mild left hydronephrosis with a 7 mm proximal left ureteral calculus, mild prostatic enlargement, stable partially calcified right adrenal myelolipoma.  CT angiogram chest limited by breath motion artifact throughout however did show positive pulmonary embolism with segmental embolus present in the left lung base.  Subpleural heterogeneous opacity of the left lung base distal to embolus concerning for developing infarction.  RV to LV ratio enlarged  measuring 1.5-1 concerning for right heart strain.  Coronary artery disease.  Hypercoagulable panel seems to have been ordered per ED.  IV heparin ordered however not given at the time of my interview.   Hospital Course:  1 left segmental pulmonary embolus/bilateral DVT(right femoral vein and right proximal profunda vein, left  femoral vein and left gastrocnemius vein) Per CT angiogram of the chest. Patient had presented with a myriad of symptoms including worsening shortness of breath over the past month, generalized fatigue, shortness of breath on minimal exertion and going up stairs, decreased appetite, dizziness. Patient with no recent surgeries, no recent long car rides or travel, no recent prior history of malignancy.  No family history of DVTs.Patient of note did have a PE in 2006 after a right ankle fracture and was anticoagulated with Lovenox/Coumadin for 6 months which he completed. Hypercoagulable panel was ordered per ED and pending at time of discharge. Patient also noted on presentation to have systolic blood pressures in the 100s.  Patient with improvement with his blood pressure with hydration.  Lower extremity Dopplers positive for bilateral proximal DVTs.  2D echo negative for right ventricular strain.  Blood pressure improved.  Cardiac enzymes negative x3.  Patient still with sats of 84% on room air.  Improving clinically slowly.    Patient given a dose of IV Lasix 20 mg x 1 as he was noted to have some bibasilar crackles.  Patient initially placed on IV heparin for 72 hours and subsequently transition to oral Xarelto.  Patient improved clinically during the hospitalization and be discharged home on Xarelto. Patient will likely require anticoagulation for life as this is his second clot.  May need outpatient follow-up with hematology.  Patient be discharged home on O2 will need to follow-up with PCP in the outpatient setting for repeat ambulatory sats.  2. Hypertension Patient noted on presentation to have systolic blood pressures in the 100s. Borderline blood pressure likely secondary to problem #1.    Patient hydrated with IV fluids with improvement with blood pressure.  Patient's antihypertensive medications were held and were not continued on discharge.  Outpatient follow-up with PCP.   3.  Depression Patient was maintained on home regimen of Wellbutrin.  Outpatient follow-up.    4. Mild hydronephrosis of the left kidney/left ureteral calculus Patient did have some complaints of intermittent left groin pain. Patient currently asymptomatic. Renal function within normal limits. Outpatient follow-up with urology.  5. Hyperlipidemia Fasting lipid panel with LDL of 80.  Patient noted to have a transaminitis which is being worked up in the outpatient setting.  Statin has been discontinued.  Outpatient follow-up.    6.  Transaminitis Questionable etiology.  CT abdomen and pelvis done on admission negative for cholelithiasis or biliary dilatation.  Simple left hepatic cyst noted.  Pancreas unremarkable.  Patient with no abdominal symptoms.  LFTs trended down.  Patient noted to have a hep B core antibody positive in January 2020, however repeat acute hepatitis panel with hepatitis B surface antigen and antibody negative.  Statin has been discontinued.  Outpatient follow-up for further evaluation.   Procedures:  CT abdomen and pelvis 07/18/2018  CT angio chest 07/18/2018  Chest x-ray 07/04/2018  2D echo 07/19/2018  Lower extremity Dopplers 07/19/2018  Consultations:  None  Discharge Exam: Vitals:   07/22/18 0846 07/22/18 0853  BP:    Pulse:    Resp:    Temp:    SpO2: (!) 86% 91%    General: NAD Cardiovascular: RRR Respiratory:  CTAB  Discharge Instructions   Discharge Instructions    Diet - low sodium heart healthy   Complete by: As directed    Increase activity slowly   Complete by: As directed      Allergies as of 07/22/2018      Reactions   Cephalexin    Feldene [piroxicam] Nausea Only, Other (See Comments)   Makes pt feel loopy      Medication List    STOP taking these medications   amLODipine 5 MG tablet Commonly known as: NORVASC   aspirin 81 MG tablet   meloxicam 15 MG tablet Commonly known as: MOBIC   simvastatin 40 MG  tablet Commonly known as: ZOCOR     TAKE these medications   buPROPion 300 MG 24 hr tablet Commonly known as: WELLBUTRIN XL Take 1 tablet (300 mg total) by mouth daily.   hydrocortisone 1 % lotion Apply topically 2 (two) times daily. TO BACK   multivitamin tablet Take 1 tablet by mouth daily.   Rivaroxaban 15 & 20 MG Tbpk Take as directed on package: Start with one '15mg'$  tablet by mouth twice a day with food. On Day 22, switch to one '20mg'$  tablet once a day with food.   senna 8.6 MG Tabs tablet Commonly known as: SENOKOT Take 1 tablet (8.6 mg total) by mouth at bedtime.   traMADol 50 MG tablet Commonly known as: ULTRAM Take 1 tablet (50 mg total) by mouth every 6 (six) hours as needed.   VITAMIN D-3 PO Take by mouth.            Durable Medical Equipment  (From admission, onward)         Start     Ordered   07/22/18 0952  For home use only DME oxygen  Once    Question Answer Comment  Length of Need 6 Months   Mode or (Route) Nasal cannula   Liters per Minute 3   Frequency Continuous (stationary and portable oxygen unit needed)   Oxygen conserving device Yes   Oxygen delivery system Gas      07/22/18 0951         Allergies  Allergen Reactions  . Cephalexin   . Feldene [Piroxicam] Nausea Only and Other (See Comments)    Makes pt feel loopy   Follow-up [facility] Follow up.   Why: Follow up for oxygen needs Contact information: [address] [place] [address] [phone]        Elby Showers, MD. Schedule an appointment as soon as possible for a visit in 1 week(s).   Specialty: Internal Medicine Why: F/U IN 1-2 WEEKS Contact information: [address] [place] [address] [phone]            The results of significant diagnostics from this hospitalization (including imaging, microbiology, ancillary and laboratory) are listed below for reference.    Significant Diagnostic Studies: Dg  Chest 2 View  Result Date: 07/04/2018 CLINICAL DATA:  Productive cough, shortness of breath EXAM: CHEST - 2 VIEW COMPARISON:  04/16/2014 FINDINGS: Heart and mediastinal contours are within normal limits. No focal opacities or effusions. No acute bony abnormality. IMPRESSION: No active cardiopulmonary disease. Electronically Signed   By: Rolm Baptise M.D.   On: 07/04/2018 11:45   Ct Angio Chest Pe W And/or Wo Contrast  Result Date: 07/18/2018 CLINICAL DATA:  Leg weakness with exertion EXAM: CT ANGIOGRAPHY CHEST WITH CONTRAST TECHNIQUE: Multidetector CT imaging of the  chest was performed using the standard protocol during bolus administration of intravenous contrast. Multiplanar CT image reconstructions and MIPs were obtained to evaluate the vascular anatomy. CONTRAST:  156m OMNIPAQUE IOHEXOL 350 MG/ML SOLN COMPARISON:  11/17/2009 FINDINGS: Cardiovascular: Examination for pulmonary embolism is limited by patient and breath motion artifact throughout. Within this limitation, positive examination for pulmonary embolism, with segmental embolus present in the left lung base (series 6, image 53). There is no definite embolus identified elsewhere. The RV:LV ratio is enlarged, measuring up to 1.5:1. Left coronary artery calcifications. No pericardial effusion. Mediastinum/Nodes: No enlarged mediastinal, hilar, or axillary lymph nodes. Thyroid gland, trachea, and esophagus demonstrate no significant findings. Lungs/Pleura: There is a subpleural heterogeneous opacity of the left lung base distal to embolus, concerning for developing infarction. No pleural effusion or pneumothorax. Upper Abdomen: No acute abnormality. Musculoskeletal: No chest wall abnormality. No acute or significant osseous findings. Review of the MIP images confirms the above findings. IMPRESSION: 1. Examination for pulmonary embolism is limited by patient and breath motion artifact throughout. Within this limitation, positive examination for  pulmonary embolism, with segmental embolus present in the left lung base (series 6, image 53). There is no definite embolus identified elsewhere. 2. There is a subpleural heterogeneous opacity of the left lung base distal to embolus, concerning for developing infarction. 3. The RV:LV ratio is enlarged, measuring up to 1.5:1. This is concerning for right heart strain by criteria in the setting of pulmonary embolism, however there is a relatively small, distal burden of embolus. Correlate with clinical concern for right heart strain and echocardiogram. 4.  Coronary artery disease. These results were called by telephone at the time of interpretation on 07/18/2018 at 3:33 pm to Dr. SVirgel Manifold, who verbally acknowledged these results. Electronically Signed   By: AEddie CandleM.D.   On: 07/18/2018 15:36   Ct Abdomen Pelvis W Contrast  Result Date: 07/18/2018 CLINICAL DATA:  Painless jaundice, weight loss. EXAM: CT ABDOMEN AND PELVIS WITH CONTRAST TECHNIQUE: Multidetector CT imaging of the abdomen and pelvis was performed using the standard protocol following bolus administration of intravenous contrast. CONTRAST:  1011mOMNIPAQUE IOHEXOL 350 MG/ML SOLN COMPARISON:  CT scan of November 17, 2009. FINDINGS: Lower chest: No acute abnormality. Hepatobiliary: No cholelithiasis or biliary dilatation is noted. Simple left hepatic cyst is noted. Pancreas: Unremarkable. No pancreatic ductal dilatation or surrounding inflammatory changes. Spleen: Normal in size without focal abnormality. Adrenals/Urinary Tract: Left adrenal gland appears normal. Stable calcified right adrenal myelolipoma is noted. Small nonobstructive calculus seen in upper pole calyx of left kidney. Mild left hydronephrosis is noted secondary to 7 mm calculus in proximal left ureter. Urinary bladder is unremarkable. Stomach/Bowel: The stomach appears normal. There is no evidence of bowel obstruction or inflammation. The appendix is not visualized.  Vascular/Lymphatic: Aortic atherosclerosis. No enlarged abdominal or pelvic lymph nodes. Reproductive: Mild prostatic enlargement is noted. Other: No abdominal wall hernia or abnormality. No abdominopelvic ascites. Musculoskeletal: Status post left total hip arthroplasty. No acute osseous abnormality is noted. IMPRESSION: Mild left hydronephrosis is noted secondary to 7 mm proximal left ureteral calculus. Stable partially calcified right adrenal myelolipoma is noted. Mild prostatic enlargement. Aortic Atherosclerosis (ICD10-I70.0). Electronically Signed   By: JaMarijo Conception.D.   On: 07/18/2018 15:51   Dg Chest Port 1 View  Result Date: 07/20/2018 CLINICAL DATA:  Shortness of breath EXAM: PORTABLE CHEST 1 VIEW COMPARISON:  Jul 04, 2018 FINDINGS: The heart, hila, and mediastinum are normal. No pneumothorax. No nodules or  masses. A small left pleural effusion is not excluded on this study. A PA and lateral chest x-ray could better evaluate. IMPRESSION: Question of a small left pleural effusion. A PA and lateral chest x-ray could better evaluate if clinically warranted. Electronically Signed   By: Dorise Bullion III M.D   On: 07/20/2018 08:53   Vas [place] Lower Extremity Venous (dvt)  Result Date: 07/20/2018  Lower Venous Study Indications: Pulmonary embolism.  Comparison Study: No prior study Performing Technologist: Maudry Mayhew MHA, RDMS, RVT, RDCS  Examination Guidelines: A complete evaluation includes B-mode imaging, spectral Doppler, color Doppler, and power Doppler as needed of all accessible portions of each vessel. Bilateral testing is considered an integral part of a complete examination. Limited examinations for reoccurring indications may be performed as noted.  +---------+---------------+---------+-----------+----------+--------------+ RIGHT    CompressibilityPhasicitySpontaneityPropertiesSummary        +---------+---------------+---------+-----------+----------+--------------+ CFV       Full           No       Yes                  Pulsatile flow +---------+---------------+---------+-----------+----------+--------------+ SFJ      Full                                                        +---------+---------------+---------+-----------+----------+--------------+ FV Prox  None                    No                   Acute          +---------+---------------+---------+-----------+----------+--------------+ FV Mid   Full                                                        +---------+---------------+---------+-----------+----------+--------------+ FV DistalFull                                                        +---------+---------------+---------+-----------+----------+--------------+ PFV      None                    No                   Acute          +---------+---------------+---------+-----------+----------+--------------+ POP      Full           No       Yes                  Pulsatile flow +---------+---------------+---------+-----------+----------+--------------+ PTV      Full                                                        +---------+---------------+---------+-----------+----------+--------------+ PERO  Full                                                        +---------+---------------+---------+-----------+----------+--------------+   +---------+---------------+---------+-----------+----------+--------------+ LEFT     CompressibilityPhasicitySpontaneityPropertiesSummary        +---------+---------------+---------+-----------+----------+--------------+ CFV      Full           No       Yes                  Pulsatile flow +---------+---------------+---------+-----------+----------+--------------+ SFJ      Full                                                        +---------+---------------+---------+-----------+----------+--------------+ FV Prox  None                    No                    Acute          +---------+---------------+---------+-----------+----------+--------------+ FV Mid   Full                                                        +---------+---------------+---------+-----------+----------+--------------+ FV DistalNone                    No                   Acute          +---------+---------------+---------+-----------+----------+--------------+ PFV      Full                                                        +---------+---------------+---------+-----------+----------+--------------+ POP      Full                                         Pulsatile flow +---------+---------------+---------+-----------+----------+--------------+ PTV      Full                                                        +---------+---------------+---------+-----------+----------+--------------+ PERO     Full                                                        +---------+---------------+---------+-----------+----------+--------------+ Gastroc  None  No                   Acute          +---------+---------------+---------+-----------+----------+--------------+     Summary: Right: Findings consistent with acute deep vein thrombosis involving the right femoral vein, and right proximal profunda vein. No cystic structure found in the popliteal fossa. Left: Findings consistent with acute deep vein thrombosis involving the left femoral vein, and left gastrocnemius vein. No cystic structure found in the popliteal fossa.  Venous flow is pulsatile bilaterally, suggestive of possibly elevated right-sided heart pressure. *See table(s) above for measurements and observations. Electronically signed by Harold Barban MD on 07/20/2018 at 4:35:55 PM.    Final     Microbiology: Recent Results (from the past 240 hour(s))  Novel Coronavirus, NAA (hospital order; send-out to ref lab)     Status: None   Collection Time: 07/18/18  4:23 PM   Specimen:  Nasopharyngeal Swab; Respiratory  Result Value Ref Range Status   SARS-CoV-2, NAA NOT DETECTED NOT DETECTED Final    Comment: (NOTE) This test was developed and its performance characteristics determined by [employer]. This test has not been FDA cleared or approved. This test has been authorized by FDA under an Emergency Use Authorization (EUA). This test is only authorized for the duration of time the declaration that circumstances exist justifying the authorization of the emergency use of in vitro diagnostic tests for detection of SARS-CoV-2 virus and/or diagnosis of COVID-19 infection under section 564(b)(1) of the Act, 21 U.S.C. 115BWI-2(M)(3), unless the authorization is terminated or revoked sooner. When diagnostic testing is negative, the possibility of a false negative result should be considered in the context of a patient's recent exposures and the presence of clinical signs and symptoms consistent with COVID-19. An individual without symptoms of COVID-19 and who is not shedding SARS-CoV-2 virus would expect to have a negative (not detected) result in this assay. Performed  At: [facility] [place], [place] [id number] Rush Farmer MD GT:[id number]    [place]  Final    Comment: Performed at [facility] [address] [place], [place] [address]  Urine culture     Status: None   Collection Time: 07/18/18 10:01 PM   Specimen: Urine, Random  Result Value Ref Range Status   Specimen Description   Final    URINE, RANDOM Performed at [facility] [address] [place], [place] [address]    Special Requests   Final    NONE Performed at [facility], [place] [address] [place] [address], [place], [place] [address]    Culture   Final    NO GROWTH Performed at [facility], [place] [address], [place], [place] [address]    Report Status 07/20/2018 FINAL  Final      Labs: Basic Metabolic Panel: Recent Labs  Lab 07/18/18 1330 07/18/18 1751 07/19/18 0629 07/20/18 0310 07/21/18 0431 07/22/18 0427  NA 132*  --  133* 132* 132* 132*  K 3.5  --  3.5 3.8 3.7 3.8  CL 100  --  100 104 100 99  CO2 23  --  '24 24 27 24  '$ GLUCOSE 85  --  98 89 88 91  BUN 18  --  '13 9 10 12  '$ CREATININE 0.80  --  0.78 0.72 0.75 0.76  CALCIUM 8.3*  --  8.0* 7.7* 8.0* 8.0*  MG  --  2.1  --   --   --   --  Liver Function Tests: Recent Labs  Lab 07/18/18 1330 07/19/18 0629 07/21/18 0431 07/22/18 0427  AST 87* 97* 82* 88*  ALT 51* 51* 43 45*  ALKPHOS 227* 238* 218* 210*  BILITOT 1.2 0.9 0.8 0.8  PROT 6.9 6.4* 5.9* 6.1*  ALBUMIN 2.4* 2.2* 2.0* 2.2*   Recent Labs  Lab 07/18/18 1330  LIPASE 25   No results for input(s): AMMONIA in the last 168 hours. CBC: Recent Labs  Lab 07/18/18 1330 07/19/18 0041 07/19/18 0629 07/20/18 0310 07/21/18 0431 07/22/18 0427  WBC 4.6 3.7* 3.8* 3.2* 3.6* 3.7*  NEUTROABS 3.2  --   --   --   --   --   HGB 11.6* 10.2* 11.1* 10.4* 10.6* 10.6*  HCT 37.6* 33.7* 35.6* 34.4*  31.9* 34.1* 34.7*  MCV 86.8 88.0 87.3 88.2 87.0 86.5  PLT 217 188 181 192 163 171   Cardiac Enzymes: Recent Labs  Lab 07/18/18 1751 07/19/18 0041 07/19/18 0629  TROPONINI <0.03 <0.03 <0.03   BNP: BNP (last 3 results) No results for input(s): BNP in the last 8760 hours.  ProBNP (last 3 results) No results for input(s): PROBNP in the last 8760 hours.  CBG: Recent Labs  Lab 07/19/18 0748 07/19/18 2147 07/20/18 0746 07/21/18 0817 07/22/18 0733  GLUCAP 122* 82 78 82 113*       Signed:  Irine Seal MD.  [facility] 07/22/2018, 12:15 PM

## 2018-07-22 NOTE — Progress Notes (Signed)
SATURATION QUALIFICATIONS: (This note is used to comply with regulatory documentation for home oxygen)  Patient Saturations on Room Air at Rest = 86%  Patient Saturations on Room Air while Ambulating = 85%  Patient Saturations on 2 Liters of oxygen while Ambulating = 94%  Please briefly explain why patient needs home oxygen: Patient unable to maintain oxygen saturation at rest and with activity.

## 2018-07-22 NOTE — TOC Progression Note (Signed)
Transition of Care [facility]) - Progression Note    Patient Details  Name: Luis Cisneros MRN: [id number] Date of Birth: [birth year]  Transition of [facility]) CM/SW [place], LCSW Phone Number: 07/22/2018, 11:56 AM  Clinical Narrative:    CSW spoke with patient wife via phone. Mimi stated she will be picking patient up from the hospital.  Per RNCM (Cookie) patient will go home with [facility] following for home health needs. CSW made patients wife aware and she was agreeable stating that she just wants her husband to get therapy. Patient oxygen has been ordered through [employer] and they will bring O2 to patients room prior to dc        Expected Discharge Plan and Services           Expected Discharge Date: 07/20/18                                     Social Determinants of Health (SDOH) Interventions    Readmission Risk Interventions No flowsheet data found.

## 2018-07-22 NOTE — TOC Transition Note (Signed)
Transition of Care [facility]) - CM/SW Discharge Note   Patient Details  Name: Luis Cisneros MRN: [id number] Date of Birth: 1940/04/10  Transition of Care [facility]) CM/SW Contact:  Wende Neighbors, LCSW Phone Number: 07/22/2018, 2:02 PM   Clinical Narrative:   Patient will be followed by [facility]. Oxygen for patient has been ordered through [employer]. Spouse aware that patient will have a copay for oxygen. Patient waiting for oxygen to be delivered     Final next level of care: [place] Barriers to Discharge: No Barriers Identified   Patient Goals and CMS Choice        Discharge Placement                Patient to be transferred to facility by: pateints wife will pick patient up Name of family member notified: Mimi (spouse) Patient and family notified of of transfer: 07/22/18  Discharge Plan and Services                DME Arranged: Oxygen DME Agency: [employer] Date DME Agency Contacted: 07/22/18 Time DME Agency Contacted: 5 Representative spoke with at DME Agency: zack blank HH Arranged: PT HH Agency: [facility] Date [place]: 07/22/18 Time [place]: 89 Representative spoke with at [place]: [place] (SDOH) Interventions     Readmission Risk Interventions No flowsheet data found.

## 2018-07-22 NOTE — Plan of Care (Signed)
  Problem: Coping: Goal: Level of anxiety will decrease Outcome: Progressing   Problem: Pain Managment: Goal: General experience of comfort will improve Outcome: Progressing   

## 2018-07-22 NOTE — Progress Notes (Signed)
Occupational Therapy Treatment Patient Details Name: Luis Cisneros MRN: [id number] DOB: [birth year] Today's Date: 07/22/2018    History of present illness Patient 78 year old gentleman history of prior pulmonary embolism diagnosed in 2006 after ankle fracture status post 6 months of anticoagulation, hyperlipidemia, hypertension, vitamin D deficiency, depression presented to the ED with worsening shortness of breath x1 month, fatigue, decreased appetite.  COVID-19 test done in the outpatient setting was negative.  Patient noted to be hypoxic with sats of 85% on room air and not on home O2.  CT angiogram chest concerning for pulmonary emboli.  Patient placed on IV heparin. Pt acute PE and BLE DVT as well.   OT comments  Pt needs 02 for activity/mobility.  Cues given for decreased safety.  Simulated gathering items in room, used toilet and stood for grooming. Pt had completed ADL prior to OT's arrival.  He states that he uses a sock aide at home.   Follow Up Recommendations  Home health OT;Supervision/Assistance - 24 hour    Equipment Recommendations  None recommended by OT    Recommendations for Other Services      Precautions / Restrictions Precautions Precautions: Fall Precaution Comments: monitor O2 sats Restrictions Weight Bearing Restrictions: No       Mobility Bed Mobility               General bed mobility comments: oob  Transfers   Equipment used: Rolling walker (2 wheeled)   Sit to Stand: Min guard;Supervision         General transfer comment: for safety; decreased controlling descent to toilet    Balance                                           ADL either performed or assessed with clinical judgement   ADL       Grooming: Wash/dry hands;Supervision/safety;Standing                   Toilet Transfer: Min guard;Comfort height toilet;RW;Ambulation   Toileting- Water quality scientist and Hygiene: Minimal assistance;Sit to/from  stand(for thoroughness)         General ADL Comments: ambulated to closet to simulate gathering clothes:  supervison to min guard for safety. Pt stated he didn't need 3:1 over toilet.  He has a high commode at home     Vision       Perception     Praxis      Cognition Arousal/Alertness: Awake/alert Behavior During Therapy: WFL for tasks assessed/performed Overall Cognitive Status: No family/caregiver present to determine baseline cognitive functioning                                 General Comments: cues for safety:  Pt tends to move quickly with RW.  When performing toilet hygiene, he got bowel matter on his leg; he spit on it and wiped it with toilet paper, had wet washcloth next to him        Exercises     Shoulder Instructions       General Comments needs 02; without 02, sats 85%.  On 2 liters, pt 95-97%    Pertinent Vitals/ Pain       Pain Assessment: No/denies pain  Home Living  Prior Functioning/Environment          Comments: independent at baseline   Frequency  Min 2X/week        Progress Toward Goals  OT Goals(current goals can now be found in the care plan section)  Progress towards OT goals: Progressing toward goals     Plan      Co-evaluation                 AM-PAC OT "6 Clicks" Daily Activity     Outcome Measure   Help from another person eating meals?: None Help from another person taking care of personal grooming?: A Little Help from another person toileting, which includes using toliet, bedpan, or urinal?: A Little Help from another person bathing (including washing, rinsing, drying)?: A Little Help from another person to put on and taking off regular upper body clothing?: A Little Help from another person to put on and taking off regular lower body clothing?: A Little 6 Click Score: 19    End of Session    OT Visit Diagnosis: Muscle weakness  (generalized) (M62.81);Unsteadiness on feet (R26.81)   Activity Tolerance Patient tolerated treatment well   Patient Left in chair;with call bell/phone within reach;with chair alarm set   Nurse Communication          Time: [phone] OT Time Calculation (min): 36 min  Charges: OT General Charges $OT Visit: 1 Visit OT Treatments $Self Care/Home Management : 23-37 mins  Lesle Chris, OTR/L Acute Rehabilitation Services [phone] [place] pager [phone] office 07/22/2018   [place] 07/22/2018, 12:56 PM

## 2018-07-24 DIAGNOSIS — M6281 Muscle weakness (generalized): Secondary | ICD-10-CM | POA: Diagnosis not present

## 2018-07-24 DIAGNOSIS — R0602 Shortness of breath: Secondary | ICD-10-CM | POA: Diagnosis not present

## 2018-07-24 DIAGNOSIS — I10 Essential (primary) hypertension: Secondary | ICD-10-CM | POA: Diagnosis not present

## 2018-07-24 DIAGNOSIS — I2699 Other pulmonary embolism without acute cor pulmonale: Secondary | ICD-10-CM | POA: Diagnosis not present

## 2018-07-24 DIAGNOSIS — E785 Hyperlipidemia, unspecified: Secondary | ICD-10-CM | POA: Diagnosis not present

## 2018-07-24 DIAGNOSIS — R269 Unspecified abnormalities of gait and mobility: Secondary | ICD-10-CM | POA: Diagnosis not present

## 2018-07-24 LAB — PROTHROMBIN GENE MUTATION

## 2018-07-25 ENCOUNTER — Telehealth (INDEPENDENT_AMBULATORY_CARE_PROVIDER_SITE_OTHER): Payer: Medicare Other | Admitting: Internal Medicine

## 2018-07-25 ENCOUNTER — Encounter: Payer: Self-pay | Admitting: Internal Medicine

## 2018-07-25 DIAGNOSIS — R300 Dysuria: Secondary | ICD-10-CM | POA: Diagnosis not present

## 2018-07-25 DIAGNOSIS — R509 Fever, unspecified: Secondary | ICD-10-CM | POA: Diagnosis not present

## 2018-07-25 DIAGNOSIS — Z86711 Personal history of pulmonary embolism: Secondary | ICD-10-CM | POA: Diagnosis not present

## 2018-07-25 LAB — POCT URINALYSIS DIP (CLINITEK)
Glucose, UA: NEGATIVE mg/dL
Ketones, POC UA: NEGATIVE mg/dL
Nitrite, UA: NEGATIVE
Spec Grav, UA: 1.01 (ref 1.010–1.025)
Urobilinogen, UA: 0.2 E.U./dL
pH, UA: 6 (ref 5.0–8.0)

## 2018-07-25 NOTE — Telephone Encounter (Signed)
Wife  sent My Chart message stating pt had low grade fever 100 degrees and decreased urination. He has lethargy and little appetite. Has had home PT. Tells wife he is exhausted. We asked to have clean catch urine specimen sent over but wife called at 4 pm saying she could not get enough for a sample. Reports decreased po intake. I am concerned about his low grade fever , lethargy and decreased urine output. He has PE and is on anticoagulant. Have asked wife to take him to [facility] ED for evaluation.

## 2018-07-25 NOTE — Addendum Note (Signed)
Addended by: Wynonia Musty A on: 07/25/2018 04:49 PM   Modules accepted: Orders

## 2018-07-26 ENCOUNTER — Telehealth: Payer: Self-pay | Admitting: Internal Medicine

## 2018-07-26 ENCOUNTER — Encounter: Payer: Self-pay | Admitting: Internal Medicine

## 2018-07-26 NOTE — Telephone Encounter (Signed)
Called to see how patient was doing today. No answer. Left voice mail message to call if not doing well. Have been concerned about fever.

## 2018-07-27 ENCOUNTER — Observation Stay (HOSPITAL_COMMUNITY): Payer: Medicare Other

## 2018-07-27 ENCOUNTER — Inpatient Hospital Stay (HOSPITAL_COMMUNITY)
Admission: EM | Admit: 2018-07-27 | Discharge: 2018-07-29 | DRG: 947 | Disposition: A | Discharge status: Home Health | Payer: Medicare Other | Attending: Internal Medicine | Admitting: Internal Medicine

## 2018-07-27 ENCOUNTER — Emergency Department (HOSPITAL_COMMUNITY): Payer: Medicare Other

## 2018-07-27 ENCOUNTER — Other Ambulatory Visit: Payer: Self-pay

## 2018-07-27 ENCOUNTER — Encounter (HOSPITAL_COMMUNITY): Payer: Self-pay

## 2018-07-27 DIAGNOSIS — Z7901 Long term (current) use of anticoagulants: Secondary | ICD-10-CM | POA: Diagnosis not present

## 2018-07-27 DIAGNOSIS — N281 Cyst of kidney, acquired: Secondary | ICD-10-CM | POA: Diagnosis not present

## 2018-07-27 DIAGNOSIS — R945 Abnormal results of liver function studies: Secondary | ICD-10-CM | POA: Diagnosis not present

## 2018-07-27 DIAGNOSIS — I82503 Chronic embolism and thrombosis of unspecified deep veins of lower extremity, bilateral: Secondary | ICD-10-CM | POA: Diagnosis present

## 2018-07-27 DIAGNOSIS — D696 Thrombocytopenia, unspecified: Secondary | ICD-10-CM | POA: Diagnosis present

## 2018-07-27 DIAGNOSIS — J9621 Acute and chronic respiratory failure with hypoxia: Secondary | ICD-10-CM | POA: Diagnosis present

## 2018-07-27 DIAGNOSIS — I2782 Chronic pulmonary embolism: Secondary | ICD-10-CM | POA: Diagnosis not present

## 2018-07-27 DIAGNOSIS — E785 Hyperlipidemia, unspecified: Secondary | ICD-10-CM | POA: Diagnosis not present

## 2018-07-27 DIAGNOSIS — I10 Essential (primary) hypertension: Secondary | ICD-10-CM | POA: Diagnosis not present

## 2018-07-27 DIAGNOSIS — K766 Portal hypertension: Secondary | ICD-10-CM

## 2018-07-27 DIAGNOSIS — Z86711 Personal history of pulmonary embolism: Secondary | ICD-10-CM

## 2018-07-27 DIAGNOSIS — Z1159 Encounter for screening for other viral diseases: Secondary | ICD-10-CM

## 2018-07-27 DIAGNOSIS — R5381 Other malaise: Secondary | ICD-10-CM | POA: Diagnosis not present

## 2018-07-27 DIAGNOSIS — F329 Major depressive disorder, single episode, unspecified: Secondary | ICD-10-CM | POA: Diagnosis present

## 2018-07-27 DIAGNOSIS — R7401 Elevation of levels of liver transaminase levels: Secondary | ICD-10-CM | POA: Diagnosis present

## 2018-07-27 DIAGNOSIS — Z87891 Personal history of nicotine dependence: Secondary | ICD-10-CM | POA: Diagnosis not present

## 2018-07-27 DIAGNOSIS — R0902 Hypoxemia: Secondary | ICD-10-CM | POA: Diagnosis not present

## 2018-07-27 DIAGNOSIS — Z79899 Other long term (current) drug therapy: Secondary | ICD-10-CM

## 2018-07-27 DIAGNOSIS — E559 Vitamin D deficiency, unspecified: Secondary | ICD-10-CM | POA: Diagnosis present

## 2018-07-27 DIAGNOSIS — R319 Hematuria, unspecified: Secondary | ICD-10-CM

## 2018-07-27 DIAGNOSIS — Z888 Allergy status to other drugs, medicaments and biological substances status: Secondary | ICD-10-CM | POA: Diagnosis not present

## 2018-07-27 DIAGNOSIS — R17 Unspecified jaundice: Secondary | ICD-10-CM | POA: Diagnosis present

## 2018-07-27 DIAGNOSIS — E871 Hypo-osmolality and hyponatremia: Secondary | ICD-10-CM | POA: Diagnosis not present

## 2018-07-27 DIAGNOSIS — R531 Weakness: Secondary | ICD-10-CM | POA: Diagnosis not present

## 2018-07-27 DIAGNOSIS — D649 Anemia, unspecified: Secondary | ICD-10-CM | POA: Diagnosis present

## 2018-07-27 DIAGNOSIS — Z8249 Family history of ischemic heart disease and other diseases of the circulatory system: Secondary | ICD-10-CM | POA: Diagnosis not present

## 2018-07-27 DIAGNOSIS — Z79891 Long term (current) use of opiate analgesic: Secondary | ICD-10-CM | POA: Diagnosis not present

## 2018-07-27 DIAGNOSIS — R0602 Shortness of breath: Secondary | ICD-10-CM

## 2018-07-27 DIAGNOSIS — F32A Depression, unspecified: Secondary | ICD-10-CM | POA: Diagnosis present

## 2018-07-27 DIAGNOSIS — Z96642 Presence of left artificial hip joint: Secondary | ICD-10-CM | POA: Diagnosis present

## 2018-07-27 DIAGNOSIS — R748 Abnormal levels of other serum enzymes: Secondary | ICD-10-CM

## 2018-07-27 DIAGNOSIS — I2699 Other pulmonary embolism without acute cor pulmonale: Secondary | ICD-10-CM | POA: Diagnosis not present

## 2018-07-27 DIAGNOSIS — K7689 Other specified diseases of liver: Secondary | ICD-10-CM | POA: Diagnosis not present

## 2018-07-27 DIAGNOSIS — R509 Fever, unspecified: Secondary | ICD-10-CM | POA: Diagnosis present

## 2018-07-27 DIAGNOSIS — R740 Nonspecific elevation of levels of transaminase and lactic acid dehydrogenase [LDH]: Secondary | ICD-10-CM | POA: Diagnosis not present

## 2018-07-27 DIAGNOSIS — K828 Other specified diseases of gallbladder: Secondary | ICD-10-CM | POA: Diagnosis not present

## 2018-07-27 LAB — CBC WITH DIFFERENTIAL/PLATELET
Abs Immature Granulocytes: 0.04 10*3/uL (ref 0.00–0.07)
Basophils Absolute: 0 10*3/uL (ref 0.0–0.1)
Basophils Relative: 1 %
Eosinophils Absolute: 0 10*3/uL (ref 0.0–0.5)
Eosinophils Relative: 0 %
HCT: 38.2 % — ABNORMAL LOW (ref 39.0–52.0)
Hemoglobin: 11.9 g/dL — ABNORMAL LOW (ref 13.0–17.0)
Immature Granulocytes: 1 %
Lymphocytes Relative: 25 %
Lymphs Abs: 1.2 10*3/uL (ref 0.7–4.0)
MCH: 26.7 pg (ref 26.0–34.0)
MCHC: 31.2 g/dL (ref 30.0–36.0)
MCV: 85.7 fL (ref 80.0–100.0)
Monocytes Absolute: 0.6 10*3/uL (ref 0.1–1.0)
Monocytes Relative: 13 %
Neutro Abs: 2.7 10*3/uL (ref 1.7–7.7)
Neutrophils Relative %: 60 %
Platelets: 112 10*3/uL — ABNORMAL LOW (ref 150–400)
RBC: 4.46 MIL/uL (ref 4.22–5.81)
RDW: 15.7 % — ABNORMAL HIGH (ref 11.5–15.5)
WBC Morphology: ABNORMAL
WBC: 4.6 10*3/uL (ref 4.0–10.5)
nRBC: 0 % (ref 0.0–0.2)

## 2018-07-27 LAB — URINALYSIS, ROUTINE W REFLEX MICROSCOPIC
Bacteria, UA: NONE SEEN
Glucose, UA: NEGATIVE mg/dL
Hgb urine dipstick: NEGATIVE
Ketones, ur: NEGATIVE mg/dL
Leukocytes,Ua: NEGATIVE
Nitrite: NEGATIVE
Protein, ur: 30 mg/dL — AB
Specific Gravity, Urine: 1.027 (ref 1.005–1.030)
pH: 5 (ref 5.0–8.0)

## 2018-07-27 LAB — SARS CORONAVIRUS 2 BY RT PCR (HOSPITAL ORDER, PERFORMED IN ~~LOC~~ HOSPITAL LAB): SARS Coronavirus 2: NEGATIVE

## 2018-07-27 LAB — MAGNESIUM: Magnesium: 2 mg/dL (ref 1.7–2.4)

## 2018-07-27 LAB — COMPREHENSIVE METABOLIC PANEL
ALT: 56 U/L — ABNORMAL HIGH (ref 0–44)
AST: 102 U/L — ABNORMAL HIGH (ref 15–41)
Albumin: 2.2 g/dL — ABNORMAL LOW (ref 3.5–5.0)
Alkaline Phosphatase: 321 U/L — ABNORMAL HIGH (ref 38–126)
Anion gap: 11 (ref 5–15)
BUN: 14 mg/dL (ref 8–23)
CO2: 25 mmol/L (ref 22–32)
Calcium: 9.1 mg/dL (ref 8.9–10.3)
Chloride: 95 mmol/L — ABNORMAL LOW (ref 98–111)
Creatinine, Ser: 0.77 mg/dL (ref 0.61–1.24)
GFR calc Af Amer: >60 mL/min (ref >60)
GFR calc non Af Amer: >60 mL/min (ref >60)
Glucose, Bld: 85 mg/dL (ref 70–99)
Potassium: 3.9 mmol/L (ref 3.5–5.1)
Sodium: 131 mmol/L — ABNORMAL LOW (ref 135–145)
Total Bilirubin: 1.9 mg/dL — ABNORMAL HIGH (ref 0.3–1.2)
Total Protein: 6.5 g/dL (ref 6.5–8.1)

## 2018-07-27 LAB — PROTIME-INR
INR: 1.7 — ABNORMAL HIGH (ref 0.8–1.2)
Prothrombin Time: 20 seconds — ABNORMAL HIGH (ref 11.4–15.2)

## 2018-07-27 LAB — RAPID URINE DRUG SCREEN, HOSP PERFORMED
Amphetamines: NOT DETECTED
Barbiturates: NOT DETECTED
Benzodiazepines: NOT DETECTED
Cocaine: NOT DETECTED
Opiates: NOT DETECTED
Tetrahydrocannabinol: NOT DETECTED

## 2018-07-27 LAB — AMMONIA: Ammonia: 35 umol/L (ref 9–35)

## 2018-07-27 LAB — APTT: aPTT: 49 seconds — ABNORMAL HIGH (ref 24–36)

## 2018-07-27 LAB — HEPARIN LEVEL (UNFRACTIONATED): Heparin Unfractionated: >2.2 IU/mL — ABNORMAL HIGH (ref 0.30–0.70)

## 2018-07-27 MED ORDER — BUPROPION HCL ER (XL) 150 MG PO TB24
300.0000 mg | ORAL_TABLET | Freq: Every day | ORAL | Status: DC
  Administered 2018-07-29: 300 mg via ORAL
  Filled 2018-07-27 (×2): qty 2

## 2018-07-27 MED ORDER — HEPARIN (PORCINE) 25000 UT/250ML-% IV SOLN
1350.0000 [IU]/h | INTRAVENOUS | Status: DC
  Administered 2018-07-27: 1350 [IU]/h via INTRAVENOUS
  Filled 2018-07-27: qty 250

## 2018-07-27 MED ORDER — SODIUM CHLORIDE (PF) 0.9 % IJ SOLN
INTRAMUSCULAR | Status: AC
  Filled 2018-07-27: qty 50

## 2018-07-27 MED ORDER — IOHEXOL 350 MG/ML SOLN
100.0000 mL | Freq: Once | INTRAVENOUS | Status: AC | PRN
  Administered 2018-07-27: 17:00:00 100 mL via INTRAVENOUS

## 2018-07-27 MED ORDER — RIVAROXABAN (XARELTO) VTE STARTER PACK (15 & 20 MG): 15.0000 mg | ORAL_TABLET | Freq: Two times a day (BID) | ORAL | Status: DC

## 2018-07-27 MED ORDER — SODIUM CHLORIDE 0.9 % IV SOLN
INTRAVENOUS | Status: DC
  Administered 2018-07-27 – 2018-07-28 (×3): via INTRAVENOUS

## 2018-07-27 MED ORDER — SODIUM CHLORIDE 0.9 % IV BOLUS
1000.0000 mL | Freq: Once | INTRAVENOUS | Status: AC
  Administered 2018-07-27: 15:00:00 1000 mL via INTRAVENOUS

## 2018-07-27 MED ORDER — SODIUM CHLORIDE 0.9 % IV BOLUS
1000.0000 mL | Freq: Once | INTRAVENOUS | Status: AC
  Administered 2018-07-27: 1000 mL via INTRAVENOUS

## 2018-07-27 NOTE — Telephone Encounter (Signed)
Spoke with patient and wife. He is lethargic and SOB. Putting out very dark urine and not much urine output. Concerned for volume depletion and possible sepsis. Please let wife stay with him at hospital due to cognitive issues with this patient. Emeline General, MD

## 2018-07-27 NOTE — Progress Notes (Signed)
ANTICOAGULATION CONSULT NOTE - Initial Consult  Pharmacy Consult for Heparin (on Xarelto PTA) Indication: pulmonary embolus/DVT  Allergies  Allergen Reactions  . Cephalexin Nausea Only  . Feldene [Piroxicam] Hives, Nausea Only and Other (See Comments)    Makes pt feel loopy    Patient Measurements:   Heparin Dosing Weight: total body weight  Vital Signs: Temp: 97.7 F (36.5 C) (06/21 1112) Temp Source: Oral (06/21 1112) BP: 103/71 (06/21 1500) Pulse Rate: 103 (06/21 1500)  Labs: Recent Labs    07/27/18 1146  HGB 11.9*  HCT 38.2*  PLT 112*  CREATININE 0.77    Estimated Creatinine Clearance: 76.1 mL/min (by C-G formula based on SCr of 0.77 mg/dL).   Medical History: Past Medical History:  Diagnosis Date  . Ankle fracture 2006   right  . Arthritis   . Depression   . Hyperlipidemia   . Hypertension   . Pulmonary embolism ([place])   . Vitamin D deficiency     Medications:  PTA: Xarelto 15mg  po BID x 21 days then 20mg  daily - patient currently on day #6 of the 15mg  BID dosing with the last dose taken 6/21 @ 0730 per patient report  Assessment:  78 yr male recently discharged from hospital (07/22/18) with newly diagnosed PE with DVT.  During previous hospitalization, received IV heparin treatment and was discharged on Xarelto  Patient presents to ED today with lethargy, decreased oral intake and reported fever.  Pharmacy consulted to dose IV heparin  Will initially monitor IV heparin with aPTT levels, as Xarelto does show a falsely elevated heparin level.  Will monitor both levels until they correlate, as effects of Xarelto wear off, and then will continue with heparin levels at that point.  Goal of Therapy:  Heparin level 0.3-0.7 units/ml aPTT 66-102 seconds Monitor platelets by anticoagulation protocol: Yes   Plan:   Baseline aPTT, PT/INR and HL in process  Will start IV heparin 12 hr after last Xarelto dose  At 19:30 begin IV heparin @ 1350 units/hr    Check aPTT 6 hr after heparin started  Monitor heparin level and CBC daily while on heparin  Adaleah Forget, Toribio Harbour, PharmD 07/27/2018,4:05 PM

## 2018-07-27 NOTE — ED Notes (Signed)
ED Provider at bedside. 

## 2018-07-27 NOTE — ED Provider Notes (Signed)
[place] DEPT Provider Note   CSN: [id number] Arrival date & time: 07/27/18  1056    History   Chief Complaint No chief complaint on file.   HPI Luis Cisneros is a 78 y.o. male.     HPI   Luis Cisneros is a 78 y.o. male, with a history of PE, hyperlipidemia, HTN, presenting to the ED with generalized fatigue for about the past week.  Patient is accompanied by his wife at the bedside and some history elements are provided by her. Patient was diagnosed with, and admitted for, a PE as well as bilateral lower extremity DVTs June 12, discharged June 16.  Since discharge, he has noted shortness of breath, especially with exertion, fatigue, and worsening decreased appetite and oral intake. He has had decreased appetite for about the last 3 weeks, but this has worsened over the last week.  He has some intermittent lightheadedness, which almost always occurs when he rises from a lying to sitting position. Wife noted a intermittent fever with T-max 100.8 F for last 3 days, typically in the morning. She noted some intermittent mild confusion.  For example, she states today patient made reference to sherbet that a family were had brought him yesterday.  That family member brought him food, but it was not sherbet. Since discharge, patient's wife is noted intermittent SPO2 readings in the low to mid 80s, even while on his supplemental O2 of 2 L/min.  This typically occurs while he is using the condenser.  However, these readings will typically rise to 92% if he is on short hose from an oxygen tank.  The SPO2 readings improved when she increased the condenser to 4 L/min.  Patient denies shortness of breath at rest, chest pain, N/V/D, abdominal pain, lower extremity pain/swelling, syncope, flank/back pain, hematochezia/melena, or any other complaints.    Past Medical History:  Diagnosis Date  . Ankle fracture 2006   right  . Arthritis   . Depression   .  Hyperlipidemia   . Hypertension   . Pulmonary embolism ([place])   . Vitamin D deficiency     Patient Active Problem List   Diagnosis Date Noted  . Generalized weakness 07/27/2018  . Fever 07/27/2018  . Hematuria 07/27/2018  . Transaminitis 07/19/2018  . Leg DVT (deep venous thromboembolism), acute, bilateral ([place]) 07/19/2018  . PE (pulmonary thromboembolism) ([place]) 07/18/2018  . HTN (hypertension) 07/18/2018  . Depression 07/18/2018  . Ureteral calculus, left 07/18/2018  . Hydronephrosis of left kidney 07/18/2018  . Acute pulmonary embolism ([place])   . Left ureteral stone   . Prepatellar bursitis 05/24/2016  . Left shoulder pain 01/11/2016  . 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 Arthritis, hip 04/28/2014  . Erectile dysfunction 11/04/2011  . History of pulmonary embolism 11/04/2011  . Family history of aortic aneurysm 11/04/2011  . Hyperlipidemia 11/01/2010  . Seasonal affective disorder ([place]) 11/01/2010  . ROTATOR CUFF TEAR 01/11/2009  . SHOULDER PAIN, RIGHT 12/14/2008  . ROTATOR CUFF SYNDROME, RIGHT 12/14/2008  . KNEE PAIN, RIGHT 04/30/2007  . 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 TONSILLECTOMY    . 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   Procedure: TOTAL HIP ARTHROPLASTY;  Surgeon: Frederik Pear, MD;  Location: [facility];  Service: Orthopedics;  Laterality: Left;        Home Medications    Prior  to Admission medications   Medication Sig Start Date End Date Taking? Authorizing Provider  buPROPion (WELLBUTRIN XL) 300 MG 24 hr tablet Take 1 tablet (300 mg total) by mouth daily. 07/04/18  Yes Baxley, Cresenciano Lick, MD  camphor-menthol [facility]) lotion Apply 1 application topically 2 (two) times a day.   Yes [provider]  Cholecalciferol (VITAMIN D3) 250 MCG (10000 UT) capsule Take 10,000 Units by mouth daily.   Yes [provider]  Multiple Vitamin (MULTIVITAMIN) tablet Take 1 tablet by mouth daily.     Yes [provider]  Rivaroxaban 15 & 20 MG TBPK Take as directed on package: Start with one 15mg  tablet by mouth twice a day with food. On Day 22, switch to one 20mg  tablet once a day with food. Patient taking differently: Take 15-20 mg by mouth See admin instructions. Take as directed on package: Start with 15 mg by mouth twice daily with food. On Day 22, switch to 20 mg daily with food. 07/22/18  Yes Eugenie Filler, MD  senna (SENOKOT) 8.6 MG TABS tablet Take 1 tablet (8.6 mg total) by mouth at bedtime. Patient taking differently: Take 1 tablet by mouth daily.  07/22/18  Yes Eugenie Filler, MD  traMADol (ULTRAM) 50 MG tablet Take 1 tablet (50 mg total) by mouth every 6 (six) hours as needed. Patient taking differently: Take 50 mg by mouth every 6 (six) hours as needed for moderate pain.  12/19/17  Yes Stefanie Libel, MD  hydrocortisone 1 % lotion Apply topically 2 (two) times daily. TO BACK Patient not taking: Reported on 07/27/2018 07/22/18   Eugenie Filler, MD    Family History Family History  Problem Relation Age of Onset  . Aortic aneurysm Mother   . Heart disease Father     Social History Social History   Tobacco Use  . Smoking status: Former Smoker    Packs/day: 1.00    Types: Cigarettes    Quit date: 02/06/1987    Years since quitting: 31.4  . Smokeless tobacco: Never Used  Substance Use Topics  . Alcohol use: Yes    Comment: 1 drink  4-5 days a week  . Drug use: No     Allergies   Cephalexin and Feldene [piroxicam]   Review of Systems Review of Systems  Constitutional: Positive for appetite change, fatigue and fever.  Respiratory: Positive for shortness of breath. Negative for cough.   Cardiovascular: Negative for chest pain and leg swelling.  Gastrointestinal: Negative for abdominal pain, blood in stool, diarrhea, nausea and vomiting.  Genitourinary: Positive for  decreased urine volume. Negative for dysuria, flank pain, frequency and hematuria.  Musculoskeletal: Negative for back pain.  Neurological: Positive for weakness and light-headedness. Negative for syncope.  All other systems reviewed and are negative.    Physical Exam Updated Vital Signs BP 116/71 (BP Location: Right Arm)   Pulse 98   Temp 97.7 F (36.5 C) (Oral)   Resp 16   SpO2 97%   Physical Exam Vitals signs and nursing note reviewed.  Constitutional:      General: He is not in acute distress.    Appearance: He is well-developed. He is not diaphoretic.  HENT:     Head: Normocephalic and atraumatic.     Mouth/Throat:     Mouth: Mucous membranes are moist.     Pharynx: Oropharynx is clear.  Eyes:     Conjunctiva/sclera: Conjunctivae normal.  Neck:     Musculoskeletal: Neck supple.  Cardiovascular:     Rate and Rhythm: Normal rate and regular rhythm.     Pulses: Normal pulses.          Radial pulses are 2+ on the right side and 2+ on the left side.       Posterior tibial pulses are 2+ on the right side and 2+ on the left side.     Heart sounds: Normal heart sounds.     Comments: Tactile temperature in the extremities appropriate and equal bilaterally. Pulmonary:     Effort: Pulmonary effort is normal. No respiratory distress.     Breath sounds: Normal breath sounds.  Abdominal:     Palpations: Abdomen is soft.     Tenderness: There is no abdominal tenderness. There is no guarding.  Musculoskeletal:     Right lower leg: No edema.     Left lower leg: No edema.  Lymphadenopathy:     Cervical: No cervical adenopathy.  Skin:    General: Skin is warm and dry.  Neurological:     Mental Status: He is alert and oriented to person, place, and time.     Comments: Motor function intact in each of the extremities.  No upright ataxia.  No facial droop.  Cranial nerves III through XII grossly intact.  Handles oral secretions without noted difficulty.  No dysphasia.   Psychiatric:        Mood and Affect: Mood and affect normal.        Speech: Speech normal.        Behavior: Behavior normal.      ED Treatments / Results  Labs (all labs ordered are listed, but only abnormal results are displayed) Labs Reviewed  COMPREHENSIVE METABOLIC PANEL - Abnormal; Notable for the following components:      Result Value   Sodium 131 (*)    Chloride 95 (*)    Albumin 2.2 (*)    AST 102 (*)    ALT 56 (*)    Alkaline Phosphatase 321 (*)    Total Bilirubin 1.9 (*)    All other components within normal limits  CBC WITH DIFFERENTIAL/PLATELET - Abnormal; Notable for the following components:   Hemoglobin 11.9 (*)    HCT 38.2 (*)    RDW 15.7 (*)    Platelets 112 (*)    All other components within normal limits  URINALYSIS, ROUTINE W REFLEX MICROSCOPIC - Abnormal; Notable for the following components:   Color, Urine AMBER (*)    Bilirubin Urine SMALL (*)    Protein, ur 30 (*)    All other components within normal limits  SARS CORONAVIRUS 2 (HOSPITAL ORDER, [facility])  URINE CULTURE  RAPID URINE DRUG SCREEN, HOSP PERFORMED  MAGNESIUM  AMMONIA  PROTIME-INR  APTT  HEPATITIS PANEL, ACUTE  HEPARIN LEVEL (UNFRACTIONATED)    Hemoglobin  Date Value Ref Range Status  07/27/2018 11.9 (L) 13.0 - 17.0 g/dL Final  07/22/2018 10.6 (L) 13.0 - 17.0 g/dL Final  07/21/2018 10.6 (L) 13.0 - 17.0 g/dL Final  07/20/2018 10.4 (L) 13.0 - 17.0 g/dL Final   HGB  Date Value Ref Range Status  03/07/2006 13.6 13.0 - 17.1 g/dL Final   ALT  Date Value Ref Range Status  07/27/2018 56 (H) 0 - 44 U/L Final  07/22/2018 45 (H) 0 - 44 U/L Final  07/21/2018 43 0 - 44 U/L Final  07/19/2018 51 (H) 0 - 44 U/L Final    AST  Date Value Ref  Range Status  07/27/2018 102 (H) 15 - 41 U/L Final  07/22/2018 88 (H) 15 - 41 U/L Final  07/21/2018 82 (H) 15 - 41 U/L Final  07/19/2018 97 (H) 15 - 41 U/L Final     EKG EKG Interpretation  Date/Time:   Sunday July 27 2018 12:28:17 EDT Ventricular Rate:  94 PR Interval:    QRS Duration: 101 QT Interval:  366 QTC Calculation: 458 R Axis:   49 Text Interpretation:  Sinus rhythm Low voltage, precordial leads Borderline T abnormalities, anterior leads Abnormal ECG Confirmed by Carmin Muskrat [id number]) on 07/27/2018 12:33:24 PM   Radiology Dg Chest 2 View  Result Date: 07/27/2018 CLINICAL DATA:  Hypoxia and altered mental status. EXAM: CHEST - 2 VIEW COMPARISON:  07/20/2018 and 07/04/2018 FINDINGS: Lungs are adequately inflated without focal lobar consolidation or effusion. Cardiomediastinal silhouette and remainder the exam is unchanged. IMPRESSION: No active cardiopulmonary disease. Electronically Signed   By: Marin Olp M.D.   On: 07/27/2018 12:31    Procedures Procedures (including critical care time)  Medications Ordered in ED Medications  0.9 %  sodium chloride infusion (has no administration in time range)  buPROPion (WELLBUTRIN XL) 24 hr tablet 300 mg (has no administration in time range)  sodium chloride 0.9 % bolus 1,000 mL (0 mLs Intravenous Stopped 07/27/18 1412)  sodium chloride 0.9 % bolus 1,000 mL (1,000 mLs Intravenous Bolus from Bag 07/27/18 1514)     Initial Impression / Assessment and Plan / ED Course  I have reviewed the triage vital signs and the nursing notes.  Pertinent labs & imaging results that were available during my care of the patient were reviewed by me and considered in my medical decision making (see chart for details).  Clinical Course as of Jul 26 1609  Sun Jul 27, 2018  1455 Noted to be 170 five days ago.  Platelets(!): 112 [SJ]  1455 Noted to be 0.8 five days ago.  Total Bilirubin(!): 1.9 [SJ]  1455 Noted to be 210 five days ago.  Alkaline Phosphatase(!): 321 [SJ]  1534 Spoke with Dr. Tawanna Solo, Hospitalist, who agrees to admit the patient.   [SJ]    Clinical Course User Index [SJ] Anthon Harpole C, PA-C       Patient presents with a  complaint of generalized weakness, fatigue, decreased appetite, shortness of breath with exertion, and low-grade fever. No noted acute abnormalities on chest x-ray. Patient is nontoxic appearing, afebrile, not tachycardic, not tachypneic, not hypotensive, maintains excellent SPO2 on room air, and is in no apparent distress.  Elevations in the patient's LFTs are worse than they were 5 days ago.  Patient adds a statin was removed from his medication list due to LFT abnormalities 2 weeks ago. He is also noted to have some thrombocytopenia that was not noted at discharge 5 days ago.  Hemoglobin higher than it was at discharge 5 days ago.  Renal function within normal limits. Patient admitted for hydration and continued monitoring of lab abnormalities.   Findings and plan of care discussed with Adrian Prows, MD. Dr. Vanita Panda personally evaluated and examined this patient.   Vitals:   07/27/18 1315 07/27/18 1330 07/27/18 1400 07/27/18 1500  BP:  113/67 104/75 103/71  Pulse: 90 88 (!) 123 (!) 103  Resp: 20 18 15 15   Temp:      TempSrc:      SpO2: 98% 99% 98% 96%     Final Clinical Impressions(s) / ED Diagnoses   Final diagnoses:  Elevated  liver enzymes  Generalized weakness    ED Discharge Orders    None       Layla Maw 07/27/18 1612    Carmin Muskrat, MD 07/29/18 [phone]

## 2018-07-27 NOTE — ED Triage Notes (Signed)
Pt wife reports low O2 sat, some confusion. Pt wife reports pt has no appetite. O2 99% on 2L nasal cannula in room currently. 2L is baseline at home.

## 2018-07-27 NOTE — H&P (Addendum)
History and Physical    Luis Cisneros Luis Cisneros:[id number] DOB: 1940/04/02 DOA: 07/27/2018  PCP: Elby Showers, MD   Patient coming from: Home    Chief Complaint: Generalized weakness, fever, confusion  HPI: Luis Cisneros is a 78 y.o. male with medical history significant of recurrent pulmonary embolism, hyperlipidemia, hypertension, depression who presents to the emergency department after he was brought by his wife from home with complaints of generalized weakness, fever, confusion.  He was discharged from here on 07/22/2018 after he was diagnosed with pulmonary embolism, bilateral lower extremity DVT.  At that time CT chest showed right heart strain.  He was initially started on IV anticoagulation which was changed to Xarelto on discharge.  Hospital course was remarkable for persistent hypoxia and his oxygenation did not improve more than 94% on room air.  He was discharged on oxygen to home. Patient did not feel really well after he was discharged.  He persistently has shortness of breath.  His oxygenation was also checked at home and it was ranging 85 to 87% on 2 L oxygen/per minute.  Information was also gathered from wife on phone.  She thinks that he is significantly dehydrated, also has dark urine.  He was having 2 or 3 episodes of fever which was mild. Was also shaking.  He was also seen by his PCP as an outpatient and was suspected of having infection.  Patient had poor appetite. Patient seen and examined the bedside in the emergency department.  Currently hemodynamically stable. Wife denied that he was not exposed to any people tested positive for COVID--19.  His COVID test done earlier this hospitalization was negative.  Current COVID test is pending.  ED Course: Chest Xray done in the emergency department did not show any pneumonia.  Patient was found to be mildly tachycardic.  Oxygen was 99% on 2 L .  Review of Systems: As per HPI otherwise 10 point review of systems negative.    Past  Medical History:  Diagnosis Date  . Ankle fracture 2006   right  . Arthritis   . Depression   . Hyperlipidemia   . Hypertension   . Pulmonary embolism ([place])   . Vitamin D deficiency     Past Surgical History:  Procedure Laterality Date  . TONSILLECTOMY    . 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 His smoking use included cigarettes. He smoked 1.00 pack per day. He has never used smokeless tobacco. He reports current alcohol use. He reports that he does not use drugs.  Allergies  Allergen Reactions  . Cephalexin Nausea Only  . Feldene [Piroxicam] Hives, Nausea Only and Other (See Comments)    Makes pt feel loopy    Family History  Problem Relation Age of Onset  . Aortic aneurysm Mother   . Heart disease Father      Prior to Admission medications   Medication Sig Start Date End Date Taking? Authorizing Provider  buPROPion (WELLBUTRIN XL) 300 MG 24 hr tablet Take 1 tablet (300 mg total) by mouth daily. 07/04/18  Yes Baxley, Cresenciano Lick, MD  camphor-menthol [facility]) lotion Apply 1 application topically 2 (two) times a day.   Yes [provider]  Cholecalciferol (VITAMIN D3) 250 MCG (10000 UT)  capsule Take 10,000 Units by mouth daily.   Yes [provider]  Multiple Vitamin (MULTIVITAMIN) tablet Take 1 tablet by mouth daily.     Yes [provider]  Rivaroxaban 15 & 20 MG TBPK Take as directed on package: Start with one 15mg  tablet by mouth twice a day with food. On Day 22, switch to one 20mg  tablet once a day with food. Patient taking differently: Take 15-20 mg by mouth See admin instructions. Take as directed on package: Start with 15 mg by mouth twice daily with food. On Day 22, switch to 20 mg daily with food. 07/22/18  Yes  Eugenie Filler, MD  senna (SENOKOT) 8.6 MG TABS tablet Take 1 tablet (8.6 mg total) by mouth at bedtime. Patient taking differently: Take 1 tablet by mouth daily.  07/22/18  Yes Eugenie Filler, MD  traMADol (ULTRAM) 50 MG tablet Take 1 tablet (50 mg total) by mouth every 6 (six) hours as needed. Patient taking differently: Take 50 mg by mouth every 6 (six) hours as needed for moderate pain.  12/19/17  Yes Stefanie Libel, MD  hydrocortisone 1 % lotion Apply topically 2 (two) times daily. TO BACK Patient not taking: Reported on 07/27/2018 07/22/18   Eugenie Filler, MD    Physical Exam: Vitals:   07/27/18 1315 07/27/18 1330 07/27/18 1400 07/27/18 1500  BP:  113/67 104/75 103/71  Pulse: 90 88 (!) 123 (!) 103  Resp: 20 18 15 15   Temp:      TempSrc:      SpO2: 98% 99% 98% 96%    Constitutional: weak,ill looking Vitals:   07/27/18 1315 07/27/18 1330 07/27/18 1400 07/27/18 1500  BP:  113/67 104/75 103/71  Pulse: 90 88 (!) 123 (!) 103  Resp: 20 18 15 15   Temp:      TempSrc:      SpO2: 98% 99% 98% 96%   Eyes: PERRL, lids and conjunctivae normal ENMT: Mucous membranes are moist. Hard on Hearing Neck: normal, supple, no masses, no thyromegaly Respiratory: clear to auscultation bilaterally, no wheezing, no crackles. Normal respiratory effort. No accessory muscle use.  Cardiovascular: Regular rate and rhythm, no murmurs / rubs / gallops. No extremity edema. 2+ pedal pulses. No carotid bruits.  Abdomen: no tenderness, no masses palpated. No hepatosplenomegaly. Bowel sounds positive.  Musculoskeletal: no clubbing / cyanosis. No joint deformity upper and lower extremities. Good ROM, no contractures. Normal muscle tone.  Skin: no rashes, lesions, ulcers. No induration Neurologic: CN 2-12 grossly intact. Sensation intact, DTR normal. Strength 5/5 in all 4.  Psychiatric: Normal judgment and insight. Alert and oriented x 3. Normal mood.   Foley Catheter:None  Labs on Admission: I have  personally reviewed following labs and imaging studies  CBC: Recent Labs  Lab 07/21/18 0431 07/22/18 0427 07/27/18 1146  WBC 3.6* 3.7* 4.6  NEUTROABS  --   --  2.7  HGB 10.6* 10.6* 11.9*  HCT 34.1* 34.7* 38.2*  MCV 87.0 86.5 85.7  PLT 163 171 751*   Basic Metabolic Panel: Recent Labs  Lab 07/21/18 0431 07/22/18 0427 07/27/18 1146  NA 132* 132* 131*  K 3.7 3.8 3.9  CL 100 99 95*  CO2 27 24 25   GLUCOSE 88 91 85  BUN 10 12 14   CREATININE 0.75 0.76 0.77  CALCIUM 8.0* 8.0* 9.1  MG  --   --  2.0   GFR: Estimated Creatinine Clearance: 76.1 mL/min (by C-G formula based on SCr of 0.77 mg/dL). Liver Function  Tests: Recent Labs  Lab 07/21/18 0431 07/22/18 0427 07/27/18 1146  AST 82* 88* 102*  ALT 43 45* 56*  ALKPHOS 218* 210* 321*  BILITOT 0.8 0.8 1.9*  PROT 5.9* 6.1* 6.5  ALBUMIN 2.0* 2.2* 2.2*   No results for input(s): LIPASE, AMYLASE in the last 168 hours. No results for input(s): AMMONIA in the last 168 hours. Coagulation Profile: No results for input(s): INR, PROTIME in the last 168 hours. Cardiac Enzymes: No results for input(s): CKTOTAL, CKMB, CKMBINDEX, TROPONINI in the last 168 hours. BNP (last 3 results) No results for input(s): PROBNP in the last 8760 hours. HbA1C: No results for input(s): HGBA1C in the last 72 hours. CBG: Recent Labs  Lab 07/21/18 0817 07/22/18 0733  GLUCAP 82 113*   Lipid Profile: No results for input(s): CHOL, HDL, LDLCALC, TRIG, CHOLHDL, LDLDIRECT in the last 72 hours. Thyroid Function Tests: No results for input(s): TSH, T4TOTAL, FREET4, T3FREE, THYROIDAB in the last 72 hours. Anemia Panel: No results for input(s): VITAMINB12, FOLATE, FERRITIN, TIBC, IRON, RETICCTPCT in the last 72 hours. Urine analysis:    Component Value Date/Time   COLORURINE AMBER (A) 07/27/2018 1146   APPEARANCEUR CLEAR 07/27/2018 1146   LABSPEC 1.027 07/27/2018 1146   PHURINE 5.0 07/27/2018 1146   GLUCOSEU NEGATIVE 07/27/2018 1146   HGBUR  NEGATIVE 07/27/2018 1146   BILIRUBINUR SMALL (A) 07/27/2018 1146   BILIRUBINUR moderate (A) 07/25/2018 1645   BILIRUBINUR NEG 11/12/2017 1004   KETONESUR NEGATIVE 07/27/2018 1146   PROTEINUR 30 (A) 07/27/2018 1146   UROBILINOGEN 0.2 07/25/2018 1645   UROBILINOGEN 0.2 04/16/2014 1357   NITRITE NEGATIVE 07/27/2018 1146   LEUKOCYTESUR NEGATIVE 07/27/2018 1146    Radiological Exams on Admission: Dg Chest 2 View  Result Date: 07/27/2018 CLINICAL DATA:  Hypoxia and altered mental status. EXAM: CHEST - 2 VIEW COMPARISON:  07/20/2018 and 07/04/2018 FINDINGS: Lungs are adequately inflated without focal lobar consolidation or effusion. Cardiomediastinal silhouette and remainder the exam is unchanged. IMPRESSION: No active cardiopulmonary disease. Electronically Signed   By: Marin Olp M.D.   On: 07/27/2018 12:31     Assessment/Plan Active Problems:   Hyperlipidemia   History of pulmonary embolism   HTN (hypertension)   Depression   Transaminitis   Generalized weakness   Fever   Hematuria    Generalized weakness/confusion Patient was reported to be hypoxic at home even on oxygen supplementation.  Rule out anticoagulation failure with Xarelto, persistent embolic burden in the lungs. I will check CT chest again with contrast Continue oxygen supplementation.  His confusion could be associated with hypoxia.  I will get an ABG.  History of recurrent pulmonary embolism: Just discharged from here a few days ago after being treated for pulmonary embolism with right heart strain.  He was discharged on Xarelto.He is on day 6 of Xarelto 15 BID.  He had an episode of pulmonary embolism 2006.  On last hospitalization, he was also found to have bilateral DVTs.  Continue IV heparin for now.  Elevated liver enzymes: Unknown etiology.  Hepatitis B panel was negative on last admission.  Other hepatitis panel was not checked.  I will recheck hepatitis panel.  CT abdomen/pelvis done on 07/18/18 did not show  any cholelithiasis or biliary dilation.  He also has mild thrombocytopenia.  Will check INR, ammonia level. Will obtain right upper quadrant ultrasound, ultrasound Doppler of the liver. No history of alcohol abuse .  Fever: Afebrile on presentation.  Wife says he had febrile episodes at home.  Will get urine culture, blood culture.  Hold antibiotics for now.  Hypertension: Currently blood pressure stable.  Not on blood pressure medication at home.  History of hyperlipidemia: He was previously on a statin.  Statin discontinued on last admission due to elevated liver enzymes.  Depression: Continue Wellbutrin  Deconditioning/debility: He already has home health set up.  Severity of Illness: The appropriate patient status for this patient is OBSERVATION.    DVT prophylaxis: Heparin IV Code Status: Full Family Communication: Discussed with wife at the bedside Consults called: None     Shelly Coss MD [facility] Pager [phone]  If 7PM-7AM, please contact night-coverage [email or web address] Password [facility]  07/27/2018, 3:47 PM

## 2018-07-27 NOTE — ED Notes (Signed)
ED TO INPATIENT HANDOFF REPORT  ED Nurse Name and Phone #:   S Name/Age/Gender Luis Cisneros 78 y.o. male Room/Bed: WA05/WA05  Code Status   Code Status: Full Code  Home/SNF/Other Home Patient oriented to: self and place Is this baseline? Yes   Triage Complete: Triage complete  Chief Complaint low oxygen not eating or drinking  Triage Note Pt wife reports low O2 sat, some confusion. Pt wife reports pt has no appetite. O2 99% on 2L nasal cannula in room currently. 2L is baseline at home.    Allergies Allergies  Allergen Reactions  . Cephalexin Nausea Only  . Feldene [Piroxicam] Hives, Nausea Only and Other (See Comments)    Makes pt feel loopy    Level of Care/Admitting Diagnosis ED Disposition    ED Disposition Condition [place] [facility] Area: [place] [[id number]]  Level of Care: Telemetry [5]  Admit to tele based on following criteria: Complex arrhythmia (Bradycardia/Tachycardia)  Covid Evaluation: Screening Protocol (No Symptoms)  Diagnosis: Generalized weakness [268341]  Admitting Physician: Shelly Coss [[id number]]  Attending Physician: Shelly Coss [[id number]]  PT Class (Do Not Modify): Observation [104]  PT Acc Code (Do Not Modify): Observation [10022]       B Medical/Surgery History Past Medical History:  Diagnosis Date  . Ankle fracture 2006   right  . Arthritis   . Depression   . Hyperlipidemia   . Hypertension   . Pulmonary embolism ([place])   . Vitamin D deficiency    Past Surgical History:  Procedure Laterality Date  . TONSILLECTOMY    . 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   Procedure: TOTAL HIP ARTHROPLASTY;  Surgeon: Frederik Pear, MD;  Location: [place];  Service: Orthopedics;  Laterality: Left;     A IV Location/Drains/Wounds Patient Lines/Drains/Airways Status   Active Line/Drains/Airways    Name:   Placement date:   Placement time:   Site:   Days:    Peripheral IV 07/27/18 Right Hand   07/27/18    1303    Hand   less than 1   Peripheral IV 07/27/18 Left Antecubital   07/27/18    1642    Antecubital   less than 1   Incision (Closed) 04/28/14 Hip Left   04/28/14    1451     1551          Intake/Output Last 24 hours No intake or output data in the 24 hours ending 07/27/18 1653  Labs/Imaging Results for orders placed or performed during the hospital encounter of 07/27/18 (from the past 48 hour(s))  Comprehensive metabolic panel     Status: Abnormal   Collection Time: 07/27/18 11:46 AM  Result Value Ref Range   Sodium 131 (L) 135 - 145 mmol/L   Potassium 3.9 3.5 - 5.1 mmol/L   Chloride 95 (L) 98 - 111 mmol/L   CO2 25 22 - 32 mmol/L   Glucose, Bld 85 70 - 99 mg/dL   BUN 14 8 - 23 mg/dL   Creatinine, Ser 0.77 0.61 - 1.24 mg/dL   Calcium 9.1 8.9 - 10.3 mg/dL   Total Protein 6.5 6.5 - 8.1 g/dL   Albumin 2.2 (L) 3.5 - 5.0 g/dL   AST 102 (H) 15 - 41 U/L   ALT 56 (H) 0 - 44 U/L   Alkaline Phosphatase 321 (H) 38 - 126 U/L   Total Bilirubin 1.9 (H) 0.3 - 1.2  mg/dL   GFR calc non Af Amer >60 >60 mL/min   GFR calc Af Amer >60 >60 mL/min   Anion gap 11 5 - 15    Comment: Performed at [facility], [place] [address] [place], [place] [address]  CBC with Differential     Status: Abnormal   Collection Time: 07/27/18 11:46 AM  Result Value Ref Range   WBC 4.6 4.0 - 10.5 K/uL   RBC 4.46 4.22 - 5.81 MIL/uL   Hemoglobin 11.9 (L) 13.0 - 17.0 g/dL   HCT 38.2 (L) 39.0 - 52.0 %   MCV 85.7 80.0 - 100.0 fL   MCH 26.7 26.0 - 34.0 pg   MCHC 31.2 30.0 - 36.0 g/dL   RDW 15.7 (H) 11.5 - 15.5 %   Platelets 112 (L) 150 - 400 K/uL    Comment: REPEATED TO VERIFY PLATELET COUNT CONFIRMED BY SMEAR Immature Platelet Fraction may be clinically indicated, consider ordering this additional test [id number]    nRBC 0.0 0.0 - 0.2 %   Neutrophils Relative % 60 %   Neutro Abs 2.7 1.7 - 7.7 K/uL   Lymphocytes Relative 25 %   Lymphs Abs 1.2  0.7 - 4.0 K/uL   Monocytes Relative 13 %   Monocytes Absolute 0.6 0.1 - 1.0 K/uL   Eosinophils Relative 0 %   Eosinophils Absolute 0.0 0.0 - 0.5 K/uL   Basophils Relative 1 %   Basophils Absolute 0.0 0.0 - 0.1 K/uL   WBC Morphology Abnormal lymphocytes present     Comment: VACUOLATED NEUTROPHILS   Immature Granulocytes 1 %   Abs Immature Granulocytes 0.04 0.00 - 0.07 K/uL   Polychromasia PRESENT     Comment: Performed at [facility], [place] [address], [place], [place] [address]  Urinalysis, Routine w reflex microscopic     Status: Abnormal   Collection Time: 07/27/18 11:46 AM  Result Value Ref Range   Color, Urine AMBER (A) YELLOW    Comment: BIOCHEMICALS MAY BE AFFECTED BY COLOR   APPearance CLEAR CLEAR   Specific Gravity, Urine 1.027 1.005 - 1.030   pH 5.0 5.0 - 8.0   Glucose, UA NEGATIVE NEGATIVE mg/dL   Hgb urine dipstick NEGATIVE NEGATIVE   Bilirubin Urine SMALL (A) NEGATIVE   Ketones, ur NEGATIVE NEGATIVE mg/dL   Protein, ur 30 (A) NEGATIVE mg/dL   Nitrite NEGATIVE NEGATIVE   Leukocytes,Ua NEGATIVE NEGATIVE   RBC / HPF 0-5 0 - 5 RBC/hpf   WBC, UA 0-5 0 - 5 WBC/hpf   Bacteria, UA NONE SEEN NONE SEEN   Mucus PRESENT    Ca Oxalate Crys, UA PRESENT     Comment: Performed at [facility], [place] [address], [place], [place] [address]  Urine rapid drug screen (hosp performed)     Status: None   Collection Time: 07/27/18 11:46 AM  Result Value Ref Range   Opiates NONE DETECTED NONE DETECTED   Cocaine NONE DETECTED NONE DETECTED   Benzodiazepines NONE DETECTED NONE DETECTED   Amphetamines NONE DETECTED NONE DETECTED   Tetrahydrocannabinol NONE DETECTED NONE DETECTED   Barbiturates NONE DETECTED NONE DETECTED    Comment: (NOTE) DRUG SCREEN FOR MEDICAL PURPOSES ONLY.  IF CONFIRMATION IS NEEDED FOR ANY PURPOSE, NOTIFY LAB WITHIN 5 DAYS. LOWEST DETECTABLE LIMITS FOR URINE DRUG SCREEN Drug Class                     Cutoff  (ng/mL) Amphetamine and metabolites  1000 Barbiturate and metabolites    200 Benzodiazepine                 211 Tricyclics and metabolites     300 Opiates and metabolites        300 Cocaine and metabolites        300 THC                            50 Performed at [facility], [place] [address] [place], [place] [address]   Magnesium     Status: None   Collection Time: 07/27/18 11:46 AM  Result Value Ref Range   Magnesium 2.0 1.7 - 2.4 mg/dL    Comment: Performed at [facility], [place] [address] [place], [place] [address]  SARS Coronavirus 2 (CEPHEID - Performed in [place] hospital lab), Hosp Order     Status: None   Collection Time: 07/27/18 11:47 AM   Specimen: Nasopharyngeal Swab  Result Value Ref Range   SARS Coronavirus 2 NEGATIVE NEGATIVE    Comment: (NOTE) If result is NEGATIVE SARS-CoV-2 target nucleic acids are NOT DETECTED. The SARS-CoV-2 RNA is generally detectable in upper and lower  respiratory specimens during the acute phase of infection. The lowest  concentration of SARS-CoV-2 viral copies this assay can detect is 250  copies / mL. A negative result does not preclude SARS-CoV-2 infection  and should not be used as the sole basis for treatment or other  patient management decisions.  A negative result may occur with  improper specimen collection / handling, submission of specimen other  than nasopharyngeal swab, presence of viral mutation(s) within the  areas targeted by this assay, and inadequate number of viral copies  (<250 copies / mL). A negative result must be combined with clinical  observations, patient history, and epidemiological information. If result is POSITIVE SARS-CoV-2 target nucleic acids are DETECTED. The SARS-CoV-2 RNA is generally detectable in upper and lower  respiratory specimens dur ing the acute phase of infection.  Positive  results are indicative of active infection with SARS-CoV-2.  Clinical   correlation with patient history and other diagnostic information is  necessary to determine patient infection status.  Positive results do  not rule out bacterial infection or co-infection with other viruses. If result is PRESUMPTIVE POSTIVE SARS-CoV-2 nucleic acids MAY BE PRESENT.   A presumptive positive result was obtained on the submitted specimen  and confirmed on repeat testing.  While 2019 novel coronavirus  (SARS-CoV-2) nucleic acids may be present in the submitted sample  additional confirmatory testing may be necessary for epidemiological  and / or clinical management purposes  to differentiate between  SARS-CoV-2 and other Sarbecovirus currently known to infect humans.  If clinically indicated additional testing with an alternate test  methodology [phone]) is advised. The SARS-CoV-2 RNA is generally  detectable in upper and lower respiratory sp ecimens during the acute  phase of infection. The expected result is Negative. Fact Sheet for Patients:  StrictlyIdeas.no Fact Sheet for Healthcare Providers: BankingDealers.co.za This test is not yet approved or cleared by the [place] FDA and has been authorized for detection and/or diagnosis of SARS-CoV-2 by FDA under an Emergency Use Authorization (EUA).  This EUA will remain in effect (meaning this test can be used) for the duration of the COVID-19 declaration under Section 564(b)(1) of the Act, 21 U.S.C. section 360bbb-3(b)(1), unless the authorization is terminated or revoked sooner. Performed  at [facility], [place] [address], [place], [place] [address]   Ammonia     Status: None   Collection Time: 07/27/18  3:28 PM  Result Value Ref Range   Ammonia 35 9 - 35 umol/L    Comment: Performed at [facility], [place] [address] [place], [place] [address]  Protime-INR     Status: Abnormal   Collection Time: 07/27/18  3:29 PM  Result Value  Ref Range   Prothrombin Time 20.0 (H) 11.4 - 15.2 seconds   INR 1.7 (H) 0.8 - 1.2    Comment: (NOTE) INR goal varies based on device and disease states. Performed at [facility], [place] [address], [place], [place] [address]   APTT     Status: Abnormal   Collection Time: 07/27/18  3:29 PM  Result Value Ref Range   aPTT 49 (H) 24 - 36 seconds    Comment:        IF BASELINE aPTT IS ELEVATED, SUGGEST PATIENT RISK ASSESSMENT BE USED TO DETERMINE APPROPRIATE ANTICOAGULANT THERAPY. Performed at [facility], [place] [address], [place], [place] [address]    Dg Chest 2 View  Result Date: 07/27/2018 CLINICAL DATA:  Hypoxia and altered mental status. EXAM: CHEST - 2 VIEW COMPARISON:  07/20/2018 and 07/04/2018 FINDINGS: Lungs are adequately inflated without focal lobar consolidation or effusion. Cardiomediastinal silhouette and remainder the exam is unchanged. IMPRESSION: No active cardiopulmonary disease. Electronically Signed   By: Marin Olp M.D.   On: 07/27/2018 12:31    Pending Labs Unresulted Labs (From admission, onward)    Start     Ordered   07/29/18 0500  CBC  Daily,   R     07/27/18 1616   07/28/18 0500  CBC  Tomorrow morning,   R     07/27/18 1541   07/28/18 0500  Comprehensive metabolic panel  Tomorrow morning,   R     07/27/18 1541   07/28/18 0330  APTT  Once-Timed,   STAT     07/27/18 1616   07/28/18 0330  Heparin level (unfractionated)  Once-Timed,   STAT     07/27/18 1616   07/27/18 1624  Blood gas, arterial  Once,   R     07/27/18 1623   07/27/18 1602  Heparin level (unfractionated)  Add-on,   AD    Comments: Please add-on to previous aPTT, Protime/INR blood sample drawn that is currently in process    07/27/18 1601   07/27/18 1544  Hepatitis panel, acute  Add-on,   AD     07/27/18 1543   07/27/18 1146  Urine culture  ONCE - STAT,   STAT     07/27/18 1146          Vitals/Pain Today's Vitals   07/27/18 1500 07/27/18 1530  07/27/18 1600 07/27/18 1653  BP: 103/71 108/66 103/68   Pulse: (!) 103 100 98   Resp: 15 15 15    Temp:      TempSrc:      SpO2: 96% 96% 98%   PainSc:    0-No pain    Isolation Precautions No active isolations  Medications Medications  0.9 %  sodium chloride infusion (has no administration in time range)  buPROPion (WELLBUTRIN XL) 24 hr tablet 300 mg (has no administration in time range)  heparin ADULT infusion 100 units/mL (25000 units/239mL sodium chloride 0.45%) (has no administration in time range)  sodium chloride 0.9 % bolus 1,000 mL (0  mLs Intravenous Stopped 07/27/18 1412)  sodium chloride 0.9 % bolus 1,000 mL (0 mLs Intravenous Stopped 07/27/18 1643)    Mobility non-ambulatory High fall risk       R Recommendations: See Admitting Provider Note  Report given to:   Additional Notes:

## 2018-07-27 NOTE — ED Notes (Signed)
Patient transported to CT 

## 2018-07-28 ENCOUNTER — Other Ambulatory Visit: Payer: Self-pay | Admitting: Internal Medicine

## 2018-07-28 ENCOUNTER — Observation Stay (HOSPITAL_COMMUNITY): Payer: Medicare Other

## 2018-07-28 ENCOUNTER — Inpatient Hospital Stay (HOSPITAL_COMMUNITY): Payer: Medicare Other

## 2018-07-28 DIAGNOSIS — J9621 Acute and chronic respiratory failure with hypoxia: Secondary | ICD-10-CM

## 2018-07-28 DIAGNOSIS — F329 Major depressive disorder, single episode, unspecified: Secondary | ICD-10-CM | POA: Diagnosis present

## 2018-07-28 DIAGNOSIS — I10 Essential (primary) hypertension: Secondary | ICD-10-CM

## 2018-07-28 DIAGNOSIS — R5381 Other malaise: Secondary | ICD-10-CM | POA: Diagnosis present

## 2018-07-28 DIAGNOSIS — Z888 Allergy status to other drugs, medicaments and biological substances status: Secondary | ICD-10-CM | POA: Diagnosis not present

## 2018-07-28 DIAGNOSIS — Z79891 Long term (current) use of opiate analgesic: Secondary | ICD-10-CM | POA: Diagnosis not present

## 2018-07-28 DIAGNOSIS — R531 Weakness: Secondary | ICD-10-CM | POA: Diagnosis not present

## 2018-07-28 DIAGNOSIS — R740 Nonspecific elevation of levels of transaminase and lactic acid dehydrogenase [LDH]: Secondary | ICD-10-CM

## 2018-07-28 DIAGNOSIS — E785 Hyperlipidemia, unspecified: Secondary | ICD-10-CM | POA: Diagnosis present

## 2018-07-28 DIAGNOSIS — E871 Hypo-osmolality and hyponatremia: Secondary | ICD-10-CM | POA: Diagnosis present

## 2018-07-28 DIAGNOSIS — Z79899 Other long term (current) drug therapy: Secondary | ICD-10-CM | POA: Diagnosis not present

## 2018-07-28 DIAGNOSIS — I82503 Chronic embolism and thrombosis of unspecified deep veins of lower extremity, bilateral: Secondary | ICD-10-CM | POA: Diagnosis present

## 2018-07-28 DIAGNOSIS — R509 Fever, unspecified: Secondary | ICD-10-CM

## 2018-07-28 DIAGNOSIS — Z8249 Family history of ischemic heart disease and other diseases of the circulatory system: Secondary | ICD-10-CM | POA: Diagnosis not present

## 2018-07-28 DIAGNOSIS — R17 Unspecified jaundice: Secondary | ICD-10-CM | POA: Diagnosis present

## 2018-07-28 DIAGNOSIS — R945 Abnormal results of liver function studies: Secondary | ICD-10-CM | POA: Diagnosis not present

## 2018-07-28 DIAGNOSIS — Z87891 Personal history of nicotine dependence: Secondary | ICD-10-CM | POA: Diagnosis not present

## 2018-07-28 DIAGNOSIS — Z86711 Personal history of pulmonary embolism: Secondary | ICD-10-CM | POA: Diagnosis not present

## 2018-07-28 DIAGNOSIS — E559 Vitamin D deficiency, unspecified: Secondary | ICD-10-CM | POA: Diagnosis present

## 2018-07-28 DIAGNOSIS — D649 Anemia, unspecified: Secondary | ICD-10-CM | POA: Diagnosis present

## 2018-07-28 DIAGNOSIS — R748 Abnormal levels of other serum enzymes: Secondary | ICD-10-CM | POA: Diagnosis present

## 2018-07-28 DIAGNOSIS — I2699 Other pulmonary embolism without acute cor pulmonale: Secondary | ICD-10-CM | POA: Diagnosis present

## 2018-07-28 DIAGNOSIS — I2782 Chronic pulmonary embolism: Secondary | ICD-10-CM | POA: Diagnosis present

## 2018-07-28 DIAGNOSIS — Z1159 Encounter for screening for other viral diseases: Secondary | ICD-10-CM | POA: Diagnosis not present

## 2018-07-28 DIAGNOSIS — D696 Thrombocytopenia, unspecified: Secondary | ICD-10-CM | POA: Diagnosis present

## 2018-07-28 DIAGNOSIS — Z7901 Long term (current) use of anticoagulants: Secondary | ICD-10-CM | POA: Diagnosis not present

## 2018-07-28 DIAGNOSIS — Z96642 Presence of left artificial hip joint: Secondary | ICD-10-CM | POA: Diagnosis present

## 2018-07-28 LAB — BLOOD GAS, ARTERIAL
Acid-Base Excess: 1.2 mmol/L (ref 0.0–2.0)
Bicarbonate: 24.1 mmol/L (ref 20.0–28.0)
Drawn by: 257881
O2 Content: 3 L/min
O2 Saturation: 93.9 %
Patient temperature: 37
pCO2 arterial: 33.4 mmHg (ref 32.0–48.0)
pH, Arterial: 7.472 — ABNORMAL HIGH (ref 7.350–7.450)
pO2, Arterial: 69.1 mmHg — ABNORMAL LOW (ref 83.0–108.0)

## 2018-07-28 LAB — COMPREHENSIVE METABOLIC PANEL
ALT: 47 U/L — ABNORMAL HIGH (ref 0–44)
AST: 97 U/L — ABNORMAL HIGH (ref 15–41)
Albumin: 1.9 g/dL — ABNORMAL LOW (ref 3.5–5.0)
Alkaline Phosphatase: 286 U/L — ABNORMAL HIGH (ref 38–126)
Anion gap: 10 (ref 5–15)
BUN: 11 mg/dL (ref 8–23)
CO2: 22 mmol/L (ref 22–32)
Calcium: 8.2 mg/dL — ABNORMAL LOW (ref 8.9–10.3)
Chloride: 99 mmol/L (ref 98–111)
Creatinine, Ser: 0.65 mg/dL (ref 0.61–1.24)
GFR calc Af Amer: >60 mL/min (ref >60)
GFR calc non Af Amer: >60 mL/min (ref >60)
Glucose, Bld: 72 mg/dL (ref 70–99)
Potassium: 3.9 mmol/L (ref 3.5–5.1)
Sodium: 131 mmol/L — ABNORMAL LOW (ref 135–145)
Total Bilirubin: 2.1 mg/dL — ABNORMAL HIGH (ref 0.3–1.2)
Total Protein: 5.7 g/dL — ABNORMAL LOW (ref 6.5–8.1)

## 2018-07-28 LAB — CBC
HCT: 34.1 % — ABNORMAL LOW (ref 39.0–52.0)
Hemoglobin: 10.5 g/dL — ABNORMAL LOW (ref 13.0–17.0)
MCH: 26.8 pg (ref 26.0–34.0)
MCHC: 30.8 g/dL (ref 30.0–36.0)
MCV: 87 fL (ref 80.0–100.0)
Platelets: 101 10*3/uL — ABNORMAL LOW (ref 150–400)
RBC: 3.92 MIL/uL — ABNORMAL LOW (ref 4.22–5.81)
RDW: 15.7 % — ABNORMAL HIGH (ref 11.5–15.5)
WBC: 5.4 10*3/uL (ref 4.0–10.5)
nRBC: 0.6 % — ABNORMAL HIGH (ref 0.0–0.2)

## 2018-07-28 LAB — HEPATITIS PANEL, ACUTE
HCV Ab: 0.2 s/co ratio (ref 0.0–0.9)
Hep A IgM: NEGATIVE
Hep B C IgM: NEGATIVE
Hepatitis B Surface Ag: NEGATIVE

## 2018-07-28 LAB — HEPARIN LEVEL (UNFRACTIONATED)
Heparin Unfractionated: 0.87 IU/mL — ABNORMAL HIGH (ref 0.30–0.70)
Heparin Unfractionated: 0.8 IU/mL — ABNORMAL HIGH (ref 0.30–0.70)

## 2018-07-28 LAB — URINE CULTURE

## 2018-07-28 LAB — APTT
aPTT: 143 seconds — ABNORMAL HIGH (ref 24–36)
aPTT: 197 seconds (ref 24–36)

## 2018-07-28 LAB — GLUCOSE, CAPILLARY: Glucose-Capillary: 90 mg/dL (ref 70–99)

## 2018-07-28 MED ORDER — HEPARIN (PORCINE) 25000 UT/250ML-% IV SOLN: 900.0000 [IU]/h | INTRAVENOUS | Status: DC

## 2018-07-28 MED ORDER — LEVALBUTEROL HCL 0.63 MG/3ML IN NEBU
0.6300 mg | INHALATION_SOLUTION | Freq: Four times a day (QID) | RESPIRATORY_TRACT | Status: DC
  Administered 2018-07-28: 0.63 mg via RESPIRATORY_TRACT

## 2018-07-28 MED ORDER — IPRATROPIUM BROMIDE 0.02 % IN SOLN
0.5000 mg | Freq: Four times a day (QID) | RESPIRATORY_TRACT | Status: DC
  Administered 2018-07-28: 21:00:00 0.5 mg via RESPIRATORY_TRACT

## 2018-07-28 MED ORDER — GUAIFENESIN ER 600 MG PO TB12
1200.0000 mg | ORAL_TABLET | Freq: Two times a day (BID) | ORAL | Status: DC
  Filled 2018-07-28: qty 2

## 2018-07-28 MED ORDER — SENNA 8.6 MG PO TABS
1.0000 | ORAL_TABLET | Freq: Every day | ORAL | Status: DC | PRN
  Administered 2018-07-28: 8.6 mg via ORAL
  Filled 2018-07-28: qty 1

## 2018-07-28 MED ORDER — HEPARIN (PORCINE) 25000 UT/250ML-% IV SOLN
1100.0000 [IU]/h | INTRAVENOUS | Status: DC
  Administered 2018-07-28: 17:00:00 1100 [IU]/h via INTRAVENOUS
  Filled 2018-07-28: qty 250

## 2018-07-28 MED ORDER — LEVALBUTEROL HCL 0.63 MG/3ML IN NEBU: 0.6300 mg | INHALATION_SOLUTION | Freq: Four times a day (QID) | RESPIRATORY_TRACT | Status: DC | PRN

## 2018-07-28 NOTE — Progress Notes (Signed)
ABG ws refused in ED per night nurse this morning.  Night RN reported patient and wife now want patient to have the ABG this morning.

## 2018-07-28 NOTE — Progress Notes (Addendum)
1930 [place] here at bedside  2015 Pt sleeping, easy to arouse, assessed, see flow sheet. Pt A&Ox3, reoriented to place. Easily falls back to sleep. Able to follow simple commands, but slow to respond. Wife at bedside assisting pt. Wife informed RN that pt had not had a BM since 6/17, asking for sennokot, MD paged for order. Wife also states pt has not urinated since 1600, will monitor. Fall precautions in place, [facility].   2100 Wife updated with new orders from MD. Upset and confused about testing. RN attempted to explain why MD had ordered tests, new meds, and breathing treatments. Wants to talk with [facility] (department director) in am regarding POC. [place] patient. Patient resting comfortably.   2210 Wife approached RN and stated she didn't want any further treatment at this time until she speaks to CM/SW regarding "observation status". Wife asking RN about hospital bill and if medicare would cover. RN informed patient's wife she would need to speak to CM/SW in am. Informed wife that patient had am labwork and CXR and she stated she would wait until she speaks to someone in the am.

## 2018-07-28 NOTE — Progress Notes (Signed)
Pharmacy: [facility]  Patient's a 78 y.o M with recent PE/DVT (07/22/18) on xarelto PTA (last dose taken on 6/21 at 0730), presented to the ED on 6/21 with c/o weakness, fever and confusion.  Anticoag. was changed to heparin drip on admission.  - heparin level is elevated at 0.80 (goal 0.3-0.7). aPTT elevated at >160 sec (goal 66-102 sec). Verified with pt's RN, heparin level was drawn on the opposite arm from where heparin drip is infusing. No bleeding noted. - cbc somewhat stable - no bleeding documented  Plan: - hold heparin drip for 1 hr, then resume back at at 900 units/hr - check 8 hr aPTT and heparin level - monitor for s/s bleeding  Dia Sitter, PharmD, BCPS 07/28/2018 6:00 PM

## 2018-07-28 NOTE — TOC Progression Note (Signed)
Transition of Care [facility]) - Progression Note    Patient Details  Name: Luis Cisneros MRN: [id number] Date of Birth: 03-Apr-1940  Transition of [facility] CM/SW Contact  Annamay Laymon, Juliann Pulse, RN Phone Number: 07/28/2018, 1:51 PM  Clinical Narrative: Patient active w/Interim HHPT-rep Kim following-will fax [place] orders @ d/c. Has home 02-[employer]-has travel tank.           Expected Discharge Plan and Services                                                 Social Determinants of Health (SDOH) Interventions    Readmission Risk Interventions No flowsheet data found.

## 2018-07-28 NOTE — Progress Notes (Signed)
PROGRESS NOTE    Luis Cisneros  OFB:[id number] DOB: 1940-06-06 DOA: 07/27/2018 PCP: Elby Showers, MD   Brief Narrative:  HPI per Dr. Shelly Coss on 07/27/2018 Luis Cisneros is a 78 y.o. male with medical history significant of recurrent pulmonary embolism, hyperlipidemia, hypertension, depression who presents to the emergency department after he was brought by his wife from home with complaints of generalized weakness, fever, confusion.  He was discharged from here on 07/22/2018 after he was diagnosed with pulmonary embolism, bilateral lower extremity DVT.  At that time CT chest showed right heart strain.  He was initially started on IV anticoagulation which was changed to Xarelto on discharge.  Hospital course was remarkable for persistent hypoxia and his oxygenation did not improve more than 94% on room air.  He was discharged on oxygen to home. Patient did not feel really well after he was discharged.  He persistently has shortness of breath.  His oxygenation was also checked at home and it was ranging 85 to 87% on 2 L oxygen/per minute.  Information was also gathered from wife on phone.  She thinks that he is significantly dehydrated, also has dark urine.  He was having 2 or 3 episodes of fever which was mild. Was also shaking.  He was also seen by his PCP as an outpatient and was suspected of having infection.  Patient had poor appetite. Patient seen and examined the bedside in the emergency department.  Currently hemodynamically stable. Wife denied that he was not exposed to any people tested positive for COVID--19.  His COVID test done earlier this hospitalization was negative.  Current COVID test is pending.  **Interim History Patient continues to be drowsy and somnolent and mildly confused.  He is hard of hearing.  Will obtain ABG and also follow-up on liver Doppler.  I have added incentive spirometry as well will continue heparin drip for now continue to monitor patient's respiratory  status.  Assessment & Plan:   Active Problems:   Hyperlipidemia   History of pulmonary embolism   HTN (hypertension)   Depression   Transaminitis   Generalized weakness   Fever   Hematuria  Generalized weakness/confusion and drowsiness -Patient was reported to be hypoxic at home even on oxygen supplementation.   -Rule out anticoagulation failure with Xarelto, persistent embolic burden in the lungs. -Checked CT chest again with contrast and showed "Known nearly occlusive pulmonary embolus within a basilar segment of the left lower lobar pulmonary artery with similar clot burden. 2. Peripheral opacity in the left lung base, distal to the embolus, may represent a pulmonary mass or developing pulmonary infarction." It also showed "Several right-sided pulmonary nodules, the largest of which measures 9 mm." -Continue oxygen supplementation.  His confusion could be associated with hypoxia.  -ABG as below -Patient remains drowsy and not back to baseline.  He appears very fatigued and wife states that his normal baseline is he is alert and talking however patient keeps falling asleep and on contact him is extremely hard of hearing  History of recurrent pulmonary embolism -Just discharged from here a few days ago after being treated for pulmonary embolism with right heart strain.   -He was discharged on Xarelto.He is on day 6 of Xarelto 15 BID.   -He had an episode of pulmonary embolism 2006.   -On last hospitalization, he was also found to have bilateral DVTs. -Continue IV heparin for now. -Will have Hematology/Oncology Weigh in for further evaluation and Reccs -Per my question  with Dr. Lindi Adie he recommends obtaining a lupus anticoagulant   Acute on Chronic Respiratory Failure with Hypoxia -As above -C/w supplemental O2 via [place] and Wean O2 as tolerated -ABG done showed 7.472/30 3.4/60 9.1/20 4.1/93.9 on 3 L of oxygen  Abnormal LFTs, trending down Hyperbilirubinemia -Unknown etiology.   Hepatitis B panel was negative on last admission.  Other hepatitis panel was not checked.   -Recheck hepatitis panel Negative.   -CT abdomen/pelvis done on 07/18/18 did not show any cholelithiasis or biliary dilation.   -AST went from 102-97 and ALT went from 56-47 -He also has mild thrombocytopenia.  Will check INR and was 1.7 and PT was 20.0, ammonia level was 35 -Will obtain right upper quadrant ultrasound, ultrasound Doppler of the liver. -RUQ U/S showed Gallbladder sludge without sonographic evidence for acute cholecystitis Echogenic liver suggesting steatosis and or hepatocellular disease. Cyst in the left hepatic lobe. Doppler is still pending  -No history of alcohol abuse . -Consider CT of the abdomen pelvis with contrast if not improving  Fever: -Afebrile on presentation.  Wife says he had febrile episodes at home.   -Will get urine culture, blood culture.  Hold antibiotics for now. -This was negative urine culture showed multiple species present -SARS-CoV-2 testing was negative -Blood cultures x2 never drawn so we will obtain them now  Hypertension -Currently blood pressure stable.  Not on blood pressure medication at home.  History of hyperlipidemia -He was previously on a statin.  Statin discontinued on last admission due to elevated liver enzymes.  Depression -Continue Wellbutrin; Recently dose was adjusted up to 300  Deconditioning/debility -He already has home health set up. -Will obtain PT/OT Consultation  Hyponatremia -Mild at 131 -C/w IVF Hydration for and monitor and trend and repeat CMP in a.m.  DVT prophylaxis: Anticoagulated with Heparin gtt Code Status: FULL CODE Family Communication: Discussed with wife at bedside Disposition Plan: Remain Inpatient for continued Workup and treatment and further evaluation  Consultants:   Hematology/Oncology    Procedures:  None   Antimicrobials:  Anti-infectives (From admission, onward)   None      Subjective: Seen and examined at bedside patient was very drowsy and somnolent and would fall back asleep when I would ask him questions.  Wife states that he is very hard of hearing and even when I was yelling at him he would not respond slowly.  No chest pain, lightheadedness or dizziness.  Patient denied any nausea or vomiting.  No other concerns or complaints at this time and wife feels that he is getting a little bit better but not back to baseline.  Objective: Vitals:   07/27/18 2036 07/28/18 0145 07/28/18 0602 07/28/18 1330  BP: 118/73 124/72 124/69 121/71  Pulse: (!) 103 (!) 106 84 87  Resp: 19 18 18 15   Temp: 98 F (36.7 C) 99.3 F (37.4 C) 99.2 F (37.3 C) 98.9 F (37.2 C)  TempSrc: Oral Oral Oral Oral  SpO2: 95% 92% 95% 97%  Weight:      Height:        Intake/Output Summary (Last 24 hours) at 07/28/2018 2014 Last data filed at 07/28/2018 1700 Gross per 24 hour  Intake 2155.8 ml  Output 250 ml  Net 1905.8 ml   Filed Weights   07/27/18 1751  Weight: 82.1 kg   Examination: Physical Exam:  Constitutional: WN/WD ill appearing [place]au[place] male in NAD and appears calm but drowsy  Eyes: Lids and conjunctivae normal, sclerae anicteric  ENMT: External Ears, Nose appear  normal. Hard of hearing.  Neck: Appears normal, supple, no cervical masses, normal ROM, no appreciable thyromegaly Respiratory: Diminished to auscultation bilaterally, no wheezing, rales, rhonchi or crackles. Normal respiratory effort and patient is not tachypenic. No accessory muscle use. Wearing supplemental O2 via [place] Cardiovascular: RRR, no murmurs / rubs / gallops. S1 and S2 auscultated. 1+ LE extremity edema. 2+ pedal pulses. No carotid bruits.  Abdomen: Soft, non-tender, Distended 2/2 to body habitus. No masses palpated. No appreciable hepatosplenomegaly. Bowel sounds positive x4.  GU: Deferred. Musculoskeletal: No clubbing / cyanosis of digits/nails. No joint deformities Skin: No rashes, lesions,  ulcers on a limited skin evaluation. No induration; Warm and dry.  Neurologic: CN 2-12 grossly intact with no focal deficit but is hard of hearing. Romberg sign and cerebellar reflexes not assessed.  Psychiatric: Drowsy and somonolent  Data Reviewed: I have personally reviewed following labs and imaging studies  CBC: Recent Labs  Lab 07/22/18 0427 07/27/18 1146 07/28/18 0508  WBC 3.7* 4.6 5.4  NEUTROABS  --  2.7  --   HGB 10.6* 11.9* 10.5*  HCT 34.7* 38.2* 34.1*  MCV 86.5 85.7 87.0  PLT 171 112* 481*   Basic Metabolic Panel: Recent Labs  Lab 07/22/18 0427 07/27/18 1146 07/28/18 0508  NA 132* 131* 131*  K 3.8 3.9 3.9  CL 99 95* 99  CO2 24 25 22   GLUCOSE 91 85 72  BUN 12 14 11   CREATININE 0.76 0.77 0.65  CALCIUM 8.0* 9.1 8.2*  MG  --  2.0  --    GFR: Estimated Creatinine Clearance: 78.6 mL/min (by C-G formula based on SCr of 0.65 mg/dL). Liver Function Tests: Recent Labs  Lab 07/22/18 0427 07/27/18 1146 07/28/18 0508  AST 88* 102* 97*  ALT 45* 56* 47*  ALKPHOS 210* 321* 286*  BILITOT 0.8 1.9* 2.1*  PROT 6.1* 6.5 5.7*  ALBUMIN 2.2* 2.2* 1.9*   No results for input(s): LIPASE, AMYLASE in the last 168 hours. Recent Labs  Lab 07/27/18 1528  AMMONIA 35   Coagulation Profile: Recent Labs  Lab 07/27/18 1529  INR 1.7*   Cardiac Enzymes: No results for input(s): CKTOTAL, CKMB, CKMBINDEX, TROPONINI in the last 168 hours. BNP (last 3 results) No results for input(s): PROBNP in the last 8760 hours. HbA1C: No results for input(s): HGBA1C in the last 72 hours. CBG: Recent Labs  Lab 07/22/18 0733 07/28/18 0635  GLUCAP 113* 90   Lipid Profile: No results for input(s): CHOL, HDL, LDLCALC, TRIG, CHOLHDL, LDLDIRECT in the last 72 hours. Thyroid Function Tests: No results for input(s): TSH, T4TOTAL, FREET4, T3FREE, THYROIDAB in the last 72 hours. Anemia Panel: No results for input(s): VITAMINB12, FOLATE, FERRITIN, TIBC, IRON, RETICCTPCT in the last 72  hours. Sepsis Labs: No results for input(s): PROCALCITON, LATICACIDVEN in the last 168 hours.  Recent Results (from the past 240 hour(s))  Urine culture     Status: None   Collection Time: 07/18/18 10:01 PM   Specimen: Urine, Random  Result Value Ref Range Status   Specimen Description   Final    URINE, RANDOM Performed at [facility] [address] [place], [place] [address]    Special Requests   Final    NONE Performed at [facility], [place] [address] [place], [place] [address]    Culture   Final    NO GROWTH Performed at [facility], [place] [address], [place],  [address]    Report Status 07/20/2018 FINAL  Final  Urine culture     Status: Abnormal   Collection Time: 07/27/18 11:46 AM   Specimen: Urine, Random  Result Value Ref Range Status   Specimen Description   Final    URINE, RANDOM Performed at [facility] [address], [place], [place] [address]    Special Requests   Final    NONE Performed at [facility], [place] [address] [place], [place] [address]    Culture MULTIPLE SPECIES PRESENT, SUGGEST RECOLLECTION (A)  Final   Report Status 07/28/2018 FINAL  Final  SARS Coronavirus 2 (CEPHEID - Performed in [place] hospital lab), Hosp Order     Status: None   Collection Time: 07/27/18 11:47 AM   Specimen: Nasopharyngeal Swab  Result Value Ref Range Status   SARS Coronavirus 2 NEGATIVE NEGATIVE Final    Comment: (NOTE) If result is NEGATIVE SARS-CoV-2 target nucleic acids are NOT DETECTED. The SARS-CoV-2 RNA is generally detectable in upper and lower  respiratory specimens during the acute phase of infection. The lowest  concentration of SARS-CoV-2 viral copies this assay can detect is 250  copies / mL. A negative result does not preclude SARS-CoV-2 infection  and should not be used as the sole basis for treatment or other  patient management decisions.  A negative result  may occur with  improper specimen collection / handling, submission of specimen other  than nasopharyngeal swab, presence of viral mutation(s) within the  areas targeted by this assay, and inadequate number of viral copies  (<250 copies / mL). A negative result must be combined with clinical  observations, patient history, and epidemiological information. If result is POSITIVE SARS-CoV-2 target nucleic acids are DETECTED. The SARS-CoV-2 RNA is generally detectable in upper and lower  respiratory specimens dur ing the acute phase of infection.  Positive  results are indicative of active infection with SARS-CoV-2.  Clinical  correlation with patient history and other diagnostic information is  necessary to determine patient infection status.  Positive results do  not rule out bacterial infection or co-infection with other viruses. If result is PRESUMPTIVE POSTIVE SARS-CoV-2 nucleic acids MAY BE PRESENT.   A presumptive positive result was obtained on the submitted specimen  and confirmed on repeat testing.  While 2019 novel coronavirus  (SARS-CoV-2) nucleic acids may be present in the submitted sample  additional confirmatory testing may be necessary for epidemiological  and / or clinical management purposes  to differentiate between  SARS-CoV-2 and other Sarbecovirus currently known to infect humans.  If clinically indicated additional testing with an alternate test  methodology [phone]) is advised. The SARS-CoV-2 RNA is generally  detectable in upper and lower respiratory sp ecimens during the acute  phase of infection. The expected result is Negative. Fact Sheet for Patients:  StrictlyIdeas.no Fact Sheet for Healthcare Providers: BankingDealers.co.za This test is not yet approved or cleared by the [place] FDA and has been authorized for detection and/or diagnosis of SARS-CoV-2 by FDA under an Emergency Use Authorization (EUA).   This EUA will remain in effect (meaning this test can be used) for the duration of the COVID-19 declaration under Section 564(b)(1) of the Act, 21 U.S.C. section 360bbb-3(b)(1), unless the authorization is terminated or revoked sooner. Performed at [facility], [place] [address] [place], [place] [address]     Radiology Studies: Dg Chest 2 View  Result Date: 07/27/2018 CLINICAL DATA:  Hypoxia and altered mental status. EXAM: CHEST - 2 VIEW COMPARISON:  07/20/2018 and 07/04/2018 FINDINGS:  Lungs are adequately inflated without focal lobar consolidation or effusion. Cardiomediastinal silhouette and remainder the exam is unchanged. IMPRESSION: No active cardiopulmonary disease. Electronically Signed   By: Marin Olp M.D.   On: 07/27/2018 12:31   Ct Angio Chest Pe W Or Wo Contrast  Result Date: 07/27/2018 CLINICAL DATA:  Dyspnea. History of pulmonary embolus. EXAM: CT ANGIOGRAPHY CHEST WITH CONTRAST TECHNIQUE: Multidetector CT imaging of the chest was performed using the standard protocol during bolus administration of intravenous contrast. Multiplanar CT image reconstructions and MIPs were obtained to evaluate the vascular anatomy. CONTRAST:  169mL OMNIPAQUE IOHEXOL 350 MG/ML SOLN COMPARISON:  07/18/2018 FINDINGS: Cardiovascular: Again seen is nearly occlusive pulmonary embolus within a basilar segment of the left lower lobe with a similar clot burden. RV/LV ratio equals 1. There is a small pericardial effusion with maximum thickness of 7 mm at the base of the heart. Mediastinum/Nodes: Borderline enlarged mediastinal lymph nodes have increased since the last exam, and measure up to 13 mm in short axis. Normal appearance of the thyroid gland and esophagus. Lungs/Pleura: Peripheral opacity in the left lung base, distal to the pulmonary embolus may represent a subpleural pulmonary mass or an area of developing pulmonary infarction. Minimal left pleural effusion. There are several  right-sided pulmonary nodules. Mainly, there is a 9 mm pulmonary nodule in the right lower lobe, image 89/157, sequence 10. Smaller more anterior 5 mm pulmonary nodule is also seen in the right lower lobe, image 88/157, sequence 10. A 5 mm perifissural pulmonary nodule is seen in the right middle lobe, image 82/157, sequence 10. Upper Abdomen: 1.9 cm water density left hepatic lobe mass likely represents a cyst. Musculoskeletal: Multilevel spondylosis of the thoracic spine. Review of the MIP images confirms the above findings. IMPRESSION: 1. Known nearly occlusive pulmonary embolus within a basilar segment of the left lower lobar pulmonary artery with similar clot burden. 2. Peripheral opacity in the left lung base, distal to the embolus, may represent a pulmonary mass or developing pulmonary infarction. 3. Several right-sided pulmonary nodules, the largest of which measures 9 mm. Non-contrast chest CT at 3-6 months is recommended. If the nodules are stable at time of repeat CT, then future CT at 18-24 months (from today's scan) is considered optional for low-risk patients, but is recommended for high-risk patients. This recommendation follows the consensus statement: Guidelines for Management of Incidental Pulmonary Nodules Detected on CT Images: From the Fleischner Society 2017; Radiology 2017; 284:228-243. Aortic Atherosclerosis (ICD10-I70.0). Electronically Signed   By: Fidela Salisbury M.D.   On: 07/27/2018 18:05   US Renal  Result Date: 07/27/2018 CLINICAL DATA:  Hematuria EXAM: RENAL / URINARY TRACT ULTRASOUND COMPLETE COMPARISON:  CT 07/18/2018 FINDINGS: Right Kidney: Renal measurements: 12.2 x 5.6 x 6.2 cm = volume: 219.4 mL . Echogenicity within normal limits. No mass or hydronephrosis visualized. Left Kidney: Renal measurements: 13.8 x 5.7 x 6.7 cm = volume: 276 mL. Cortical echogenicity within normal limits. No hydronephrosis. Left parapelvic cyst measuring up to 11 mm in the lower pole. Bladder:  Appears normal for degree of bladder distention. Mild debris in the urinary bladder IMPRESSION: 1. Negative for hydronephrosis or shadowing stone. Small cyst in the left kidney 2. Mild debris in the urinary bladder Electronically Signed   By: Donavan Foil M.D.   On: 07/27/2018 17:19   US Abdomen Limited Ruq  Result Date: 07/27/2018 CLINICAL DATA:  Elevated LFT EXAM: ULTRASOUND ABDOMEN LIMITED RIGHT UPPER QUADRANT COMPARISON:  CT 07/18/2018 FINDINGS: Gallbladder: Moderate sludge. No  shadowing stone. Normal wall thickness. Negative sonographic Murphy. Common bile duct: Diameter: 3 mm Liver: Increased hepatic echogenicity. 2.2 cm cyst in the left hepatic lobe. Portal vein is patent on color Doppler imaging with normal direction of blood flow towards the liver. IMPRESSION: 1. Gallbladder sludge without sonographic evidence for acute cholecystitis 2. Echogenic liver suggesting steatosis and or hepatocellular disease. Cyst in the left hepatic lobe Electronically Signed   By: Donavan Foil M.D.   On: 07/27/2018 17:21   Scheduled Meds:  buPROPion  300 mg Oral Daily   Continuous Infusions:  sodium chloride 100 mL/hr at 07/28/18 1502   heparin 900 Units/hr (07/28/18 1919)    LOS: 0 days   Kerney Elbe, DO [facility] PAGER is on AMION  If 7PM-7AM, please contact night-coverage [email or web address] Password [facility] 07/28/2018, 8:14 PM

## 2018-07-28 NOTE — Evaluation (Signed)
Physical Therapy Evaluation Patient Details Name: Luis Cisneros MRN: [id number] DOB: [birth year] Today's Date: 07/28/2018   History of Present Illness  78 y.o. male with medical history significant of recurrent pulmonary embolism, hyperlipidemia, hypertension, depression who presents to the emergency department after he was brought by his wife from home with complaints of generalized weakness, fever, confusion.  He was discharged from here on 07/22/2018 after he was diagnosed with pulmonary embolism, bilateral lower extremity DVT.  At that time CT chest showed right heart strain. admitted with weakness, confusion  Clinical Impression  Pt admitted with above diagnosis. Pt currently with functional limitations due to the deficits listed below (see PT Problem List).  Pt weaker than on last recent admission. Confused but able to follow commands.  Able to take pivotal steps to chair but too fatigued to amb, maintaining sats in low 90s with 3L O2. Encouraged upright seated posture and trunk extension, reinforced with pt wife  Pt will benefit from skilled PT to increase their independence and safety with mobility to allow discharge to the venue listed below.       Follow Up Recommendations Home health PT    Equipment Recommendations  None recommended by PT    Recommendations for Other Services       Precautions / Restrictions Precautions Precautions: Fall Precaution Comments: monitor O2 sats      Mobility  Bed Mobility Overal bed mobility: Needs Assistance Bed Mobility: Supine to Sit     Supine to sit: Min assist     General bed mobility comments: assist with trunk, multi-modal cues  Transfers Overall transfer level: Needs assistance Equipment used: Rolling walker (2 wheeled) Transfers: Sit to/from Stand Sit to Stand: Min assist;From elevated surface Stand pivot transfers: Min assist       General transfer comment: assist to rise adn control descent. pt does not follow cues for  hand placement, wife attempting to intervene and assist  Ambulation/Gait             General Gait Details: pivotal steps to chair; SpO2 91-92% on 3L throughout  Stairs            Wheelchair Mobility    Modified Rankin (Stroke Patients Only)       Balance Overall balance assessment: Needs assistance   Sitting balance-Leahy Scale: Fair       Standing balance-Leahy Scale: Poor Standing balance comment: reliant on UEs and external assist                             Pertinent Vitals/Pain Pain Assessment: No/denies pain    Home Living Family/patient expects to be discharged to:: Private residence Living Arrangements: Spouse/significant other Available Help at Discharge: Family Type of Home: House Home Access: Stairs to enter Entrance Stairs-Rails: Can reach both Entrance Stairs-Number of Steps: 2-3 Home Layout: Two level;Bed/bath upstairs Home Equipment: Walker - 2 wheels;Bedside commode      Prior Function Level of Independence: Independent         Comments: independent at baseline prior to previous admission     Hand Dominance        Extremity/Trunk Assessment   Upper Extremity Assessment Upper Extremity Assessment: Overall WFL for tasks assessed    Lower Extremity Assessment Lower Extremity Assessment: Overall WFL for tasks assessed       Communication   Communication: No difficulties  Cognition Arousal/Alertness: Awake/alert(sleepy) Behavior During Therapy: WFL for tasks assessed/performed Overall Cognitive Status: Impaired/Different  from baseline Area of Impairment: Attention;Following commands;Safety/judgement;Problem solving                   Current Attention Level: Sustained   Following Commands: Follows one step commands with increased time;Follows multi-step commands inconsistently Safety/Judgement: Decreased awareness of safety;Decreased awareness of deficits   Problem Solving: Slow processing;Decreased  initiation;Difficulty sequencing;Requires verbal cues;Requires tactile cues        General Comments      Exercises     Assessment/Plan    PT Assessment Patient needs continued PT services  PT Problem List Decreased activity tolerance;Decreased mobility       PT Treatment Interventions Functional mobility training;Therapeutic activities;Gait training;Therapeutic exercise;DME instruction    PT Goals (Current goals can be found in the Care Plan section)  Acute Rehab PT Goals Patient Stated Goal: get better PT Goal Formulation: With patient/family Time For Goal Achievement: 08/11/18 Potential to Achieve Goals: Good    Frequency Min 3X/week   Barriers to discharge        Co-evaluation               AM-PAC PT "6 Clicks" Mobility  Outcome Measure Help needed turning from your back to your side while in a flat bed without using bedrails?: A Little Help needed moving from lying on your back to sitting on the side of a flat bed without using bedrails?: A Little Help needed moving to and from a bed to a chair (including a wheelchair)?: A Little Help needed standing up from a chair using your arms (e.g., wheelchair or bedside chair)?: A Little Help needed to walk in hospital room?: A Little Help needed climbing 3-5 steps with a railing? : A Lot 6 Click Score: 17    End of Session Equipment Utilized During Treatment: Gait belt Activity Tolerance: Patient limited by fatigue Patient left: in chair;with chair alarm set;with family/visitor present   PT Visit Diagnosis: Muscle weakness (generalized) (M62.81);Other abnormalities of gait and mobility (R26.89)    Time: 6378-5885 PT Time Calculation (min) (ACUTE ONLY): 25 min   Charges:   PT Evaluation $PT Eval Low Complexity: 1 Low PT Treatments $Therapeutic Activity: 8-22 mins        Kenyon Ana, PT  Pager: [phone] Acute Rehab Dept [facility]): [phone]   07/28/2018   [facility] 07/28/2018, 1:38  PM

## 2018-07-28 NOTE — Progress Notes (Signed)
Pharmacist notified of PTT level.  See new Heparin orders.

## 2018-07-28 NOTE — Care Management Obs Status (Signed)
[facility] NOTIFICATION   Patient Details  Name: ADDAM GOELLER MRN: [id number] Date of Birth: November 23, 1940   Medicare Observation Status Notification Given:  Yes    MahabirJuliann Pulse, RN 07/28/2018, 1:06 PM

## 2018-07-28 NOTE — Progress Notes (Signed)
[facility] for Heparin (on Xarelto PTA) Indication: pulmonary embolus/DVT  Allergies  Allergen Reactions  . Cephalexin Nausea Only  . Feldene [Piroxicam] Hives, Nausea Only and Other (See Comments)    Makes pt feel loopy    Patient Measurements: Height: 5\' 10"  (177.8 cm) Weight: 181 lb (82.1 kg) IBW/kg (Calculated) : 73 Heparin Dosing Weight: total body weight  Vital Signs: Temp: (P) 99.2 F (37.3 C) (06/22 0602) Temp Source: (P) Oral (06/22 0602) BP: (P) 124/69 (06/22 0602) Pulse Rate: (P) 84 (06/22 0602)  Labs: Recent Labs    07/27/18 1146 07/27/18 1529 07/27/18 1541 07/28/18 0508  HGB 11.9*  --   --  10.5*  HCT 38.2*  --   --  34.1*  PLT 112*  --   --  101*  APTT  --  49*  --  143*  LABPROT  --  20.0*  --   --   INR  --  1.7*  --   --   HEPARINUNFRC  --   --  >2.20* 0.87*  CREATININE 0.77  --   --  0.65    Estimated Creatinine Clearance: 78.6 mL/min (by C-G formula based on SCr of 0.65 mg/dL).  Medications:  PTA: Xarelto 15mg  po BID x 21 days then 20mg  daily - patient currently on day #6 of the 15mg  BID dosing with the last dose taken 6/21 @ 0730 per patient report  Assessment:  78 yr male recently discharged from hospital (07/22/18) with newly diagnosed PE with DVT.  During previous hospitalization, received IV heparin treatment and was discharged on Xarelto  Patient presents to ED today with lethargy, decreased oral intake and reported fever.  Pharmacy consulted to dose IV heparin  Will initially monitor IV heparin with aPTT levels, as Xarelto does show a falsely elevated heparin level.  Will monitor both levels until they correlate, as effects of Xarelto wear off, and then will continue with heparin levels at that point.  07/28/2018 First aPTT is above goal at 143. Heparin level is supra-therapeutic at 0.87 due to effect of xarelto.   CBC stable, Hg 10.5, pltc remains low at 101.  No bleeding reported.   Goal of  Therapy:  Heparin level 0.3-0.7 units/ml aPTT 66-102 seconds Monitor platelets by anticoagulation protocol: Yes   Plan:   Hold heparin drip x 1 hour  Resume at reduced rate of 1100 units/hr  Check aPTT and HL 8 hr after resumed  Monitor heparin level and CBC daily while on heparin  Eudelia Bunch, Pharm.D 07/28/2018 7:07 AM

## 2018-07-29 ENCOUNTER — Inpatient Hospital Stay (HOSPITAL_COMMUNITY): Payer: Medicare Other

## 2018-07-29 LAB — COMPREHENSIVE METABOLIC PANEL
ALT: 43 U/L (ref 0–44)
AST: 108 U/L — ABNORMAL HIGH (ref 15–41)
Albumin: 1.9 g/dL — ABNORMAL LOW (ref 3.5–5.0)
Alkaline Phosphatase: 264 U/L — ABNORMAL HIGH (ref 38–126)
Anion gap: 7 (ref 5–15)
BUN: 13 mg/dL (ref 8–23)
CO2: 22 mmol/L (ref 22–32)
Calcium: 8 mg/dL — ABNORMAL LOW (ref 8.9–10.3)
Chloride: 103 mmol/L (ref 98–111)
Creatinine, Ser: 0.66 mg/dL (ref 0.61–1.24)
GFR calc Af Amer: >60 mL/min (ref >60)
GFR calc non Af Amer: >60 mL/min (ref >60)
Glucose, Bld: 75 mg/dL (ref 70–99)
Potassium: 3.9 mmol/L (ref 3.5–5.1)
Sodium: 132 mmol/L — ABNORMAL LOW (ref 135–145)
Total Bilirubin: 2.3 mg/dL — ABNORMAL HIGH (ref 0.3–1.2)
Total Protein: 5.6 g/dL — ABNORMAL LOW (ref 6.5–8.1)

## 2018-07-29 LAB — CBC WITH DIFFERENTIAL/PLATELET
Abs Immature Granulocytes: 0.13 10*3/uL — ABNORMAL HIGH (ref 0.00–0.07)
Abs Immature Granulocytes: 0.22 10*3/uL — ABNORMAL HIGH (ref 0.00–0.07)
Basophils Absolute: 0.1 10*3/uL (ref 0.0–0.1)
Basophils Absolute: 0.1 10*3/uL (ref 0.0–0.1)
Basophils Relative: 1 %
Basophils Relative: 1 %
Eosinophils Absolute: 0 10*3/uL (ref 0.0–0.5)
Eosinophils Absolute: 0 10*3/uL (ref 0.0–0.5)
Eosinophils Relative: 0 %
Eosinophils Relative: 0 %
HCT: 34.3 % — ABNORMAL LOW (ref 39.0–52.0)
HCT: 34.3 % — ABNORMAL LOW (ref 39.0–52.0)
Hemoglobin: 10.5 g/dL — ABNORMAL LOW (ref 13.0–17.0)
Hemoglobin: 10.7 g/dL — ABNORMAL LOW (ref 13.0–17.0)
Immature Granulocytes: 2 %
Immature Granulocytes: 3 %
Lymphocytes Relative: 50 %
Lymphocytes Relative: 53 %
Lymphs Abs: 3.2 10*3/uL (ref 0.7–4.0)
Lymphs Abs: 4.5 10*3/uL — ABNORMAL HIGH (ref 0.7–4.0)
MCH: 26.5 pg (ref 26.0–34.0)
MCH: 26.8 pg (ref 26.0–34.0)
MCHC: 30.6 g/dL (ref 30.0–36.0)
MCHC: 31.2 g/dL (ref 30.0–36.0)
MCV: 86.6 fL (ref 80.0–100.0)
MCV: 85.8 fL (ref 80.0–100.0)
Monocytes Absolute: 0.8 10*3/uL (ref 0.1–1.0)
Monocytes Absolute: 0.9 10*3/uL (ref 0.1–1.0)
Monocytes Relative: 12 %
Monocytes Relative: 11 %
Neutro Abs: 2.3 10*3/uL (ref 1.7–7.7)
Neutro Abs: 2.7 10*3/uL (ref 1.7–7.7)
Neutrophils Relative %: 35 %
Neutrophils Relative %: 32 %
Platelets: 70 10*3/uL — ABNORMAL LOW (ref 150–400)
Platelets: 62 10*3/uL — ABNORMAL LOW (ref 150–400)
RBC: 3.96 MIL/uL — ABNORMAL LOW (ref 4.22–5.81)
RBC: 4 MIL/uL — ABNORMAL LOW (ref 4.22–5.81)
RDW: 15.8 % — ABNORMAL HIGH (ref 11.5–15.5)
RDW: 15.8 % — ABNORMAL HIGH (ref 11.5–15.5)
WBC: 6.5 10*3/uL (ref 4.0–10.5)
WBC: 8.5 10*3/uL (ref 4.0–10.5)
nRBC: 0.6 % — ABNORMAL HIGH (ref 0.0–0.2)
nRBC: 1.2 % — ABNORMAL HIGH (ref 0.0–0.2)

## 2018-07-29 LAB — FACTOR 5 LEIDEN

## 2018-07-29 LAB — PHOSPHORUS: Phosphorus: 2 mg/dL — ABNORMAL LOW (ref 2.5–4.6)

## 2018-07-29 LAB — HEPARIN LEVEL (UNFRACTIONATED)
Heparin Unfractionated: 0.4 IU/mL (ref 0.30–0.70)
Heparin Unfractionated: 0.42 IU/mL (ref 0.30–0.70)

## 2018-07-29 LAB — MAGNESIUM: Magnesium: 1.9 mg/dL (ref 1.7–2.4)

## 2018-07-29 LAB — APTT: aPTT: 92 seconds — ABNORMAL HIGH (ref 24–36)

## 2018-07-29 MED ORDER — BOOST / RESOURCE BREEZE PO LIQD CUSTOM: 1.0000 | ORAL | Status: DC

## 2018-07-29 MED ORDER — RIVAROXABAN 15 MG PO TABS
15.0000 mg | ORAL_TABLET | Freq: Two times a day (BID) | ORAL | Status: DC
  Administered 2018-07-29: 15 mg via ORAL
  Filled 2018-07-29: qty 1

## 2018-07-29 MED ORDER — SODIUM CHLORIDE 0.9% FLUSH: 3.0000 mL | INTRAVENOUS | Status: DC | PRN

## 2018-07-29 MED ORDER — ENSURE ENLIVE PO LIQD: 237.0000 mL | ORAL | 12 refills | Status: AC

## 2018-07-29 MED ORDER — SODIUM CHLORIDE 0.9 % IV SOLN: 250.0000 mL | INTRAVENOUS | Status: DC | PRN

## 2018-07-29 MED ORDER — ENSURE ENLIVE PO LIQD: 237.0000 mL | ORAL | Status: DC

## 2018-07-29 MED ORDER — SODIUM PHOSPHATES 45 MMOLE/15ML IV SOLN
20.0000 mmol | Freq: Once | INTRAVENOUS | Status: AC
  Administered 2018-07-29: 20 mmol via INTRAVENOUS
  Filled 2018-07-29: qty 6.67

## 2018-07-29 MED ORDER — SODIUM CHLORIDE 0.9% FLUSH: 3.0000 mL | Freq: Two times a day (BID) | INTRAVENOUS | Status: DC

## 2018-07-29 MED ORDER — ADULT MULTIVITAMIN W/MINERALS CH: 1.0000 | ORAL_TABLET | Freq: Every day | ORAL | Status: DC

## 2018-07-29 NOTE — Consult Note (Signed)
[place]  Telephone:(336) [phone] Fax:(336) [phone]    [place]  Referring MD: Dr. Kerney Elbe   Reason for Referral: Pulmonary embolism  HPI: Mr. Gittleman is a 78 year old male with past medical history including recurrent pulmonary embolism (first PE was in 2006 following a fracture of his leg -took 1 year of Lovenox followed by warfarin, he had a second pulmonary embolism with bilateral lower extremity DVT diagnosed earlier this month), hyperlipidemia, hypertension, depression.  He presented to the emergency room with complaints of generalized weakness, fever, and confusion.  He was discharged from the hospital on July 22, 2018 after being diagnosed with a pulmonary embolism and bilateral lower extremity DVT.  During his prior hospitalization, he received IV heparin and was discharged with Xarelto. He has been taking this without any missed doses.  Work-up in the emergency room did not show any evidence of pneumonia on the chest x-ray.  He had mild tachycardia.  A CT angiogram of the chest was performed which showed a known nearly occlusive pulmonary embolus within the basilar segment of the left lower lobar pulmonary artery with similar clot burden, peripheral opacity of the left lung base, distal to the embolus which may represent a pulmonary mass or developing pulmonary infarction, several right sided pulmonary nodules the largest of which measures 9 mm -a repeat CT of the chest is recommended in 3 to 6 months.  There was concern for anticoagulation failure was Xarelto.  He was started on IV heparin.  When seen today, his wife is at the bedside and provides most of the history.  The patient is sleeping in bed but does awaken.  The patient's wife states that he had generalized weakness at home and she was concerned that he was dehydrated.  He has been having some shortness of breath which had not worsened.  He has not been having any chest  discomfort, dizziness, headaches.  Denies abdominal pain, nausea, vomiting.  No bleeding has been noted.  Hematology was asked to see the patient to make recommendations regarding anticoagulation.  Past Medical History:  Diagnosis Date   Ankle fracture 2006   right   Arthritis    Depression    Hyperlipidemia    Hypertension    Pulmonary embolism ([place])    Vitamin D deficiency   :    Past Surgical History:  Procedure Laterality Date   TONSILLECTOMY     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   dr Mayer Camel   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   Procedure: TOTAL HIP ARTHROPLASTY;  Surgeon: Frederik Pear, MD;  Location: [facility];  Service: Orthopedics;  Laterality: Left;  :   CURRENT MEDS: Current Facility-Administered Medications  Medication Dose Route Frequency Provider Last Rate Last Dose   buPROPion (WELLBUTRIN XL) 24 hr tablet 300 mg  300 mg Oral Daily Shelly Coss, MD   300 mg at 07/29/18 0905   [START ON 07/09/2018] feeding supplement (BOOST / RESOURCE BREEZE) liquid 1 Container  1 Container Oral Q24H Sheikh, Omair [place], DO       feeding supplement (ENSURE ENLIVE) (ENSURE ENLIVE) liquid 237 mL  237 mL Oral Q24H Sheikh, Omair Latif, DO       heparin ADULT infusion 100 units/mL (25000 units/220mL sodium chloride 0.45%)  900 Units/hr Intravenous Continuous Lynelle Doctor, RPH 9 mL/hr at 07/28/18 1919 900 Units/hr at 07/28/18 1919   levalbuterol (XOPENEX) nebulizer solution 0.63 mg  0.63 mg Nebulization Q6H PRN Raiford Noble [place], DO  multivitamin with minerals tablet 1 tablet  1 tablet Oral Daily Sheikh, Omair [place], DO       senna [facility]) tablet 8.6 mg  1 tablet Oral Daily PRN Jani Gravel, MD   8.6 mg at 07/28/18 2159   sodium phosphate 20 mmol in dextrose 5 % 250 mL infusion  20 mmol Intravenous Once Sheikh, Omair Latif, DO 43 mL/hr at 07/29/18 0900 20 mmol at 07/29/18 0900      Allergies  Allergen Reactions   Cephalexin Nausea Only   Feldene  [Piroxicam] Hives, Nausea Only and Other (See Comments)    Makes pt feel loopy  :  Family History  Problem Relation Age of Onset   Aortic aneurysm Mother    Heart disease Father   :  Social History   Socioeconomic History   Marital status: Married    Spouse name: Not on file   Number of children: Not on file   Years of education: Not on file   Highest education level: Not on file  Occupational History   Not on file  Social Needs   Financial resource strain: Not on file   Food insecurity    Worry: Not on file    Inability: Not on file   Transportation needs    Medical: Not on file    Non-medical: Not on file  Tobacco Use   Smoking status: Former Smoker    Packs/day: 1.00    Types: Cigarettes    Quit date: 02/06/1987    Years since quitting: 31.4   Smokeless tobacco: Never Used  Substance and Sexual Activity   Alcohol use: Yes    Comment: 1 drink  4-5 days a week   Drug use: No   Sexual activity: Not on file  Lifestyle   Physical activity    Days per week: Not on file    Minutes per session: Not on file   Stress: Not on file  Relationships   Social connections    Talks on phone: Not on file    Gets together: Not on file    Attends religious service: Not on file    Active member of club or organization: Not on file    Attends meetings of clubs or organizations: Not on file    Relationship status: Not on file   Intimate partner violence    Fear of current or ex partner: Not on file    Emotionally abused: Not on file    Physically abused: Not on file    Forced sexual activity: Not on file  Other Topics Concern   Not on file  Social History Narrative   Not on file  :  REVIEW OF SYSTEMS: A comprehensive 14 point review of systems was negative except as noted in the HPI.  Exam: Patient Vitals for the past 24 hrs:  BP Temp Temp src Pulse Resp SpO2  07/29/18 0502 126/74 100.1 F (37.8 C) Oral (!) 106 14 95 %  07/28/18 2225 109/62 99.1 F  (37.3 C) Axillary (!) 103 16 93 %  07/28/18 2058 -- -- -- -- -- 94 %    General: Sleepy, but awakens easily.  No distress. Eyes:  no scleral icterus.   ENT:  There were no oropharyngeal lesions.   Neck was without thyromegaly.  Lymphatics:  Negative cervical, supraclavicular or axillary adenopathy.   Respiratory: lungs were clear bilaterally without wheezing or crackles.  Cardiovascular:  Regular rate and rhythm, S1/S2, without murmur, rub or gallop.  There was  no pedal edema.   GI:  abdomen was soft, flat, nontender, nondistended, without organomegaly.  Musculoskeletal:  no spinal tenderness of palpation of vertebral spine.   Skin exam was without ecchymosis, petechiae.   Neuro exam was nonfocal. Patient was alert and oriented.  Attention was good.   Language was appropriate.  Mood was normal without depression.  Speech was not pressured.  Thought content was not tangential.    LABS:  Lab Results  Component Value Date   WBC 6.5 07/29/2018   HGB 10.5 (L) 07/29/2018   HCT 34.3 (L) 07/29/2018   PLT 70 (L) 07/29/2018   GLUCOSE 75 07/29/2018   CHOL 117 07/19/2018   TRIG 104 07/19/2018   HDL 16 (L) 07/19/2018   LDLCALC 80 07/19/2018   ALT 43 07/29/2018   AST 108 (H) 07/29/2018   NA 132 (L) 07/29/2018   K 3.9 07/29/2018   CL 103 07/29/2018   CREATININE 0.66 07/29/2018   BUN 13 07/29/2018   CO2 22 07/29/2018   PSA 0.7 11/08/2017   INR 1.7 (H) 07/27/2018    Dg Chest 2 View  Result Date: 07/27/2018 CLINICAL DATA:  Hypoxia and altered mental status. EXAM: CHEST - 2 VIEW COMPARISON:  07/20/2018 and 07/04/2018 FINDINGS: Lungs are adequately inflated without focal lobar consolidation or effusion. Cardiomediastinal silhouette and remainder the exam is unchanged. IMPRESSION: No active cardiopulmonary disease. Electronically Signed   By: Marin Olp M.D.   On: 07/27/2018 12:31   Dg Chest 2 View  Result Date: 07/04/2018 CLINICAL DATA:  Productive cough, shortness of breath EXAM: CHEST -  2 VIEW COMPARISON:  04/16/2014 FINDINGS: Heart and mediastinal contours are within normal limits. No focal opacities or effusions. No acute bony abnormality. IMPRESSION: No active cardiopulmonary disease. Electronically Signed   By: Rolm Baptise M.D.   On: 07/04/2018 11:45   Ct Angio Chest Pe W Or Wo Contrast  Result Date: 07/27/2018 CLINICAL DATA:  Dyspnea. History of pulmonary embolus. EXAM: CT ANGIOGRAPHY CHEST WITH CONTRAST TECHNIQUE: Multidetector CT imaging of the chest was performed using the standard protocol during bolus administration of intravenous contrast. Multiplanar CT image reconstructions and MIPs were obtained to evaluate the vascular anatomy. CONTRAST:  134mL OMNIPAQUE IOHEXOL 350 MG/ML SOLN COMPARISON:  07/18/2018 FINDINGS: Cardiovascular: Again seen is nearly occlusive pulmonary embolus within a basilar segment of the left lower lobe with a similar clot burden. RV/LV ratio equals 1. There is a small pericardial effusion with maximum thickness of 7 mm at the base of the heart. Mediastinum/Nodes: Borderline enlarged mediastinal lymph nodes have increased since the last exam, and measure up to 13 mm in short axis. Normal appearance of the thyroid gland and esophagus. Lungs/Pleura: Peripheral opacity in the left lung base, distal to the pulmonary embolus may represent a subpleural pulmonary mass or an area of developing pulmonary infarction. Minimal left pleural effusion. There are several right-sided pulmonary nodules. Mainly, there is a 9 mm pulmonary nodule in the right lower lobe, image 89/157, sequence 10. Smaller more anterior 5 mm pulmonary nodule is also seen in the right lower lobe, image 88/157, sequence 10. A 5 mm perifissural pulmonary nodule is seen in the right middle lobe, image 82/157, sequence 10. Upper Abdomen: 1.9 cm water density left hepatic lobe mass likely represents a cyst. Musculoskeletal: Multilevel spondylosis of the thoracic spine. Review of the MIP images confirms  the above findings. IMPRESSION: 1. Known nearly occlusive pulmonary embolus within a basilar segment of the left lower lobar pulmonary artery with similar  clot burden. 2. Peripheral opacity in the left lung base, distal to the embolus, may represent a pulmonary mass or developing pulmonary infarction. 3. Several right-sided pulmonary nodules, the largest of which measures 9 mm. Non-contrast chest CT at 3-6 months is recommended. If the nodules are stable at time of repeat CT, then future CT at 18-24 months (from today's scan) is considered optional for low-risk patients, but is recommended for high-risk patients. This recommendation follows the consensus statement: Guidelines for Management of Incidental Pulmonary Nodules Detected on CT Images: From the Fleischner Society 2017; Radiology 2017; 284:228-243. Aortic Atherosclerosis (ICD10-I70.0). Electronically Signed   By: Fidela Salisbury M.D.   On: 07/27/2018 18:05   Ct Angio Chest Pe W And/or Wo Contrast  Result Date: 07/18/2018 CLINICAL DATA:  Leg weakness with exertion EXAM: CT ANGIOGRAPHY CHEST WITH CONTRAST TECHNIQUE: Multidetector CT imaging of the chest was performed using the standard protocol during bolus administration of intravenous contrast. Multiplanar CT image reconstructions and MIPs were obtained to evaluate the vascular anatomy. CONTRAST:  175mL OMNIPAQUE IOHEXOL 350 MG/ML SOLN COMPARISON:  11/17/2009 FINDINGS: Cardiovascular: Examination for pulmonary embolism is limited by patient and breath motion artifact throughout. Within this limitation, positive examination for pulmonary embolism, with segmental embolus present in the left lung base (series 6, image 53). There is no definite embolus identified elsewhere. The RV:LV ratio is enlarged, measuring up to 1.5:1. Left coronary artery calcifications. No pericardial effusion. Mediastinum/Nodes: No enlarged mediastinal, hilar, or axillary lymph nodes. Thyroid gland, trachea, and esophagus  demonstrate no significant findings. Lungs/Pleura: There is a subpleural heterogeneous opacity of the left lung base distal to embolus, concerning for developing infarction. No pleural effusion or pneumothorax. Upper Abdomen: No acute abnormality. Musculoskeletal: No chest wall abnormality. No acute or significant osseous findings. Review of the MIP images confirms the above findings. IMPRESSION: 1. Examination for pulmonary embolism is limited by patient and breath motion artifact throughout. Within this limitation, positive examination for pulmonary embolism, with segmental embolus present in the left lung base (series 6, image 53). There is no definite embolus identified elsewhere. 2. There is a subpleural heterogeneous opacity of the left lung base distal to embolus, concerning for developing infarction. 3. The RV:LV ratio is enlarged, measuring up to 1.5:1. This is concerning for right heart strain by criteria in the setting of pulmonary embolism, however there is a relatively small, distal burden of embolus. Correlate with clinical concern for right heart strain and echocardiogram. 4.  Coronary artery disease. These results were called by telephone at the time of interpretation on 07/18/2018 at 3:33 pm to Dr. Virgel Manifold , who verbally acknowledged these results. Electronically Signed   By: Eddie Candle M.D.   On: 07/18/2018 15:36   Ct Abdomen Pelvis W Contrast  Result Date: 07/18/2018 CLINICAL DATA:  Painless jaundice, weight loss. EXAM: CT ABDOMEN AND PELVIS WITH CONTRAST TECHNIQUE: Multidetector CT imaging of the abdomen and pelvis was performed using the standard protocol following bolus administration of intravenous contrast. CONTRAST:  184mL OMNIPAQUE IOHEXOL 350 MG/ML SOLN COMPARISON:  CT scan of November 17, 2009. FINDINGS: Lower chest: No acute abnormality. Hepatobiliary: No cholelithiasis or biliary dilatation is noted. Simple left hepatic cyst is noted. Pancreas: Unremarkable. No pancreatic  ductal dilatation or surrounding inflammatory changes. Spleen: Normal in size without focal abnormality. Adrenals/Urinary Tract: Left adrenal gland appears normal. Stable calcified right adrenal myelolipoma is noted. Small nonobstructive calculus seen in upper pole calyx of left kidney. Mild left hydronephrosis is noted secondary to 7 mm calculus  in proximal left ureter. Urinary bladder is unremarkable. Stomach/Bowel: The stomach appears normal. There is no evidence of bowel obstruction or inflammation. The appendix is not visualized. Vascular/Lymphatic: Aortic atherosclerosis. No enlarged abdominal or pelvic lymph nodes. Reproductive: Mild prostatic enlargement is noted. Other: No abdominal wall hernia or abnormality. No abdominopelvic ascites. Musculoskeletal: Status post left total hip arthroplasty. No acute osseous abnormality is noted. IMPRESSION: Mild left hydronephrosis is noted secondary to 7 mm proximal left ureteral calculus. Stable partially calcified right adrenal myelolipoma is noted. Mild prostatic enlargement. Aortic Atherosclerosis (ICD10-I70.0). Electronically Signed   By: Marijo Conception M.D.   On: 07/18/2018 15:51   US Renal  Result Date: 07/27/2018 CLINICAL DATA:  Hematuria EXAM: RENAL / URINARY TRACT ULTRASOUND COMPLETE COMPARISON:  CT 07/18/2018 FINDINGS: Right Kidney: Renal measurements: 12.2 x 5.6 x 6.2 cm = volume: 219.4 mL . Echogenicity within normal limits. No mass or hydronephrosis visualized. Left Kidney: Renal measurements: 13.8 x 5.7 x 6.7 cm = volume: 276 mL. Cortical echogenicity within normal limits. No hydronephrosis. Left parapelvic cyst measuring up to 11 mm in the lower pole. Bladder: Appears normal for degree of bladder distention. Mild debris in the urinary bladder IMPRESSION: 1. Negative for hydronephrosis or shadowing stone. Small cyst in the left kidney 2. Mild debris in the urinary bladder Electronically Signed   By: Donavan Foil M.D.   On: 07/27/2018 17:19   Dg  Chest Port 1 View  Result Date: 07/20/2018 CLINICAL DATA:  Shortness of breath EXAM: PORTABLE CHEST 1 VIEW COMPARISON:  Jul 04, 2018 FINDINGS: The heart, hila, and mediastinum are normal. No pneumothorax. No nodules or masses. A small left pleural effusion is not excluded on this study. A PA and lateral chest x-ray could better evaluate. IMPRESSION: Question of a small left pleural effusion. A PA and lateral chest x-ray could better evaluate if clinically warranted. Electronically Signed   By: Dorise Bullion III M.D   On: 07/20/2018 08:53   US Liver Doppler  Result Date: 07/28/2018 CLINICAL DATA:  Elevated LFTs EXAM: DUPLEX ULTRASOUND OF LIVER TECHNIQUE: Color and duplex Doppler ultrasound was performed to evaluate the hepatic in-flow and out-flow vessels. COMPARISON:  07/27/2018 FINDINGS: Portal Vein Velocities Main:  25 cm/sec Right:  28 cm/sec Left:  17 cm/sec Hepatic Vein Velocities Right:  98 cm/sec Middle:  44 cm/sec Left:  38 cm/sec Hepatic Artery Velocity:  112 cm/sec Splenic Vein Velocity:  19 cm/sec Varices: Not visualized Ascites: Visualized Patent portal, hepatic and splenic veins with normal directional flow. Negative for portal vein occlusion or thrombus. IMPRESSION: Normal hepatic venous Doppler. Electronically Signed   By: Jerilynn Mages.  Shick M.D.   On: 07/28/2018 20:47   Vas [place] Lower Extremity Venous (dvt)  Result Date: 07/20/2018  Lower Venous Study Indications: Pulmonary embolism.  Comparison Study: No prior study Performing Technologist: Maudry Mayhew MHA, RDMS, RVT, RDCS  Examination Guidelines: A complete evaluation includes B-mode imaging, spectral Doppler, color Doppler, and power Doppler as needed of all accessible portions of each vessel. Bilateral testing is considered an integral part of a complete examination. Limited examinations for reoccurring indications may be performed as noted.  +---------+---------------+---------+-----------+----------+--------------+  RIGHT      Compressibility Phasicity Spontaneity Properties Summary         +---------+---------------+---------+-----------+----------+--------------+  CFV       Full            No        Yes  Pulsatile flow  +---------+---------------+---------+-----------+----------+--------------+  SFJ       Full                                                             +---------+---------------+---------+-----------+----------+--------------+  FV Prox   None                      No                     Acute           +---------+---------------+---------+-----------+----------+--------------+  FV Mid    Full                                                             +---------+---------------+---------+-----------+----------+--------------+  FV Distal Full                                                             +---------+---------------+---------+-----------+----------+--------------+  PFV       None                      No                     Acute           +---------+---------------+---------+-----------+----------+--------------+  POP       Full            No        Yes                    Pulsatile flow  +---------+---------------+---------+-----------+----------+--------------+  PTV       Full                                                             +---------+---------------+---------+-----------+----------+--------------+  PERO      Full                                                             +---------+---------------+---------+-----------+----------+--------------+   +---------+---------------+---------+-----------+----------+--------------+  LEFT      Compressibility Phasicity Spontaneity Properties Summary         +---------+---------------+---------+-----------+----------+--------------+  CFV       Full            No        Yes                    Pulsatile flow  +---------+---------------+---------+-----------+----------+--------------+  SFJ  Full                                                              +---------+---------------+---------+-----------+----------+--------------+  FV Prox   None                      No                     Acute           +---------+---------------+---------+-----------+----------+--------------+  FV Mid    Full                                                             +---------+---------------+---------+-----------+----------+--------------+  FV Distal None                      No                     Acute           +---------+---------------+---------+-----------+----------+--------------+  PFV       Full                                                             +---------+---------------+---------+-----------+----------+--------------+  POP       Full                                             Pulsatile flow  +---------+---------------+---------+-----------+----------+--------------+  PTV       Full                                                             +---------+---------------+---------+-----------+----------+--------------+  PERO      Full                                                             +---------+---------------+---------+-----------+----------+--------------+  Gastroc   None                      No                     Acute           +---------+---------------+---------+-----------+----------+--------------+     Summary: Right: Findings consistent with acute deep vein thrombosis involving the right femoral vein, and right proximal profunda vein. No cystic structure  found in the popliteal fossa. Left: Findings consistent with acute deep vein thrombosis involving the left femoral vein, and left gastrocnemius vein. No cystic structure found in the popliteal fossa.  Venous flow is pulsatile bilaterally, suggestive of possibly elevated right-sided heart pressure. *See table(s) above for measurements and observations. Electronically signed by Harold Barban MD on 07/20/2018 at 4:35:55 PM.    Final    US Abdomen Limited Ruq  Result Date:  07/27/2018 CLINICAL DATA:  Elevated LFT EXAM: ULTRASOUND ABDOMEN LIMITED RIGHT UPPER QUADRANT COMPARISON:  CT 07/18/2018 FINDINGS: Gallbladder: Moderate sludge. No shadowing stone. Normal wall thickness. Negative sonographic Murphy. Common bile duct: Diameter: 3 mm Liver: Increased hepatic echogenicity. 2.2 cm cyst in the left hepatic lobe. Portal vein is patent on color Doppler imaging with normal direction of blood flow towards the liver. IMPRESSION: 1. Gallbladder sludge without sonographic evidence for acute cholecystitis 2. Echogenic liver suggesting steatosis and or hepatocellular disease. Cyst in the left hepatic lobe Electronically Signed   By: Donavan Foil M.D.   On: 07/27/2018 17:21    ASSESSMENT AND PLAN:  1.  Pulmonary embolism with bilateral lower extremity DVT diagnosed in June 2020 2.  History of provoked pulmonary embolism in 2006 3.  Mild anemia 4.  Thrombocytopenia 5.  Pulmonary nodules 6.  Elevated LFTs  -Lupus anticoagulant is pending.   -The patient was recently started on Xarelto for a PE and bilateral lower extremity DVT about a week ago.  It will take several months for his clots to resolve.  At this point, we cannot say that the patient has failed Xarelto.  Recommend that he continue Xarelto. -CT images reviewed and suspect area in his left lung is a infarct related to his PE.  He also has several pulmonary nodules and does have a history of smoking.  We will plan for a repeat CT scan of the chest in 3 to 6 months. -Recommend continued monitoring of his CBC.  May need further work-up of his anemia or thrombocytopenia if this persists or worsens. -The patient has ongoing elevated LFTs.  Consider a CT of the abdomen and pelvis if this persists or worsens.   Thank you for this referral.  Mikey Bussing, DNP, AGPCNP-BC, AOCNP

## 2018-07-29 NOTE — Progress Notes (Signed)
Initial Nutrition Assessment  RD working remotely.   DOCUMENTATION CODES:   (unable to assess for malnutrition at this time.)  INTERVENTION:  - will order Boost Breeze once/day, each supplement provides 250 kcal and 9 grams of protein. - will order Ensure Enlive once/day, each supplement provides 350 kcal and 20 grams of protein. - will order Magic Cup with lunch meals, each supplement provides 290 kcal and 9 grams of protein. - will order daily multivitamin with minerals. - continue to encourage PO intakes.    NUTRITION DIAGNOSIS:   Inadequate oral intake related to acute illness, poor appetite as evidenced by per patient/family report.  GOAL:   Patient will meet greater than or equal to 90% of their needs  MONITOR:   PO intake, Supplement acceptance, Labs, Weight trends  REASON FOR ASSESSMENT:   Malnutrition Screening Tool, Consult Assessment of nutrition requirement/status  ASSESSMENT:   78 year-old male with medical history significant of recurrent pulmonary embolism, hyperlipidemia, HTN, and depression. He presented to the ED when wife took him in d/t complaints of generalized weakness, fever, and confusion. He was discharged on 6/16 after he was diagnosed with pulmonary embolism, BLE DVT. At that time CT chest showed right heart strain. He was discharged on O2. He reported not feeling well after discharge and that he had persistent SOB. Wife reported dark urine with concern for dehydration and that patient had a poor appetite.  Attempted to call patient's room phone x2 but phone rang and rang with no answer both times. No intakes have been documented since admission on 6/21. Notes indicate that wife reported poor appetite PTA; unsure of how long this has been ongoing.   Per chart review, current weight is 181 lb and weight on 5/29 was 184 lb. This indicates 3 lb weight loss (1.6% body weight) in 3 weeks; not significant for time frame. Weight on 1/10 was 199 lb indicating  18 lb weight loss (9% body weight) in the past 5-5.5 months; not significant for time frame.   SLP attempted to work with patient a short time ago but was unable d/t lethargy/fatigue and RN report that this has been ongoing throughout day shift.   Per notes: generalized weakness and confusion, drowsiness, extremely HOH during MD exam/assessment, acute on chronic respiratory failure with hypoxia, abnormal LFTs--trending down, hyperbilirubinemia, CT abdomen/pelvis negative for cholelithiasis or biliary dilation, RUQ ultrasound showed gallbladder sludge without evidence of acute cholecystitis, depression, deconditioning and debility.    Medication reviewed; 20 mmol IV NaPhos x1 run 6/23. Labs reviewed; Na: 132 mmol/l, Ca: 8 mg/dl, Phos: 2 mg/dl, Alk Phos and AST elevated.      NUTRITION - FOCUSED PHYSICAL EXAM:  unable to complete at this time.   Diet Order:   Diet Order            Diet Heart Room service appropriate? Yes; Fluid consistency: Thin  Diet effective now              EDUCATION NEEDS:   Not appropriate for education at this time  Skin:  Skin Assessment: Reviewed RN Assessment  Last BM:  PTA/unknown  Height:   Ht Readings from Last 1 Encounters:  07/27/18 '5\' 10"'$  (1.778 m)    Weight:   Wt Readings from Last 1 Encounters:  07/27/18 82.1 kg    Ideal Body Weight:  75.4 kg  BMI:  Body mass index is 25.97 kg/m.  Estimated Nutritional Needs:   Kcal:  1890-2050 kcal  Protein:  70-80 grams  Fluid:  >/= 2 L/day     Jarome Matin, MS, RD, LDN, [facility] Inpatient Clinical Dietitian Pager # [phone] After hours/weekend pager # [phone]

## 2018-07-29 NOTE — Progress Notes (Signed)
[place] for Heparin (on Xarelto PTA) Indication: pulmonary embolus/DVT  Allergies  Allergen Reactions  . Cephalexin Nausea Only  . Feldene [Piroxicam] Hives, Nausea Only and Other (See Comments)    Makes pt feel loopy    Patient Measurements: Height: 5\' 10"  (177.8 cm) Weight: 181 lb (82.1 kg) IBW/kg (Calculated) : 73 Heparin Dosing Weight: total body weight  Vital Signs: Temp: 100.1 F (37.8 C) (06/23 0502) Temp Source: Oral (06/23 0502) BP: 126/74 (06/23 0502) Pulse Rate: 106 (06/23 0502)  Labs: Recent Labs    07/27/18 1146  07/27/18 1529  07/28/18 0508 07/28/18 1608 07/29/18 0352 07/29/18 1254  HGB 11.9*  --   --   --  10.5*  --  10.5*  --   HCT 38.2*  --   --   --  34.1*  --  34.3*  --   PLT 112*  --   --   --  101*  --  70*  --   APTT  --    < > 49*  --  143* 197* 92*  --   LABPROT  --   --  20.0*  --   --   --   --   --   INR  --   --  1.7*  --   --   --   --   --   HEPARINUNFRC  --   --   --    < > 0.87* 0.80* 0.40 0.42  CREATININE 0.77  --   --   --  0.65  --  0.66  --    < > = values in this interval not displayed.    Estimated Creatinine Clearance: 78.6 mL/min (by C-G formula based on SCr of 0.66 mg/dL).  Medications:  PTA: Xarelto 15mg  po BID x 21 days then 20mg  daily - patient currently on day #6 of the 15mg  BID dosing with the last dose taken 6/21 @ 0730 per patient report  Assessment:  78 yr male recently discharged from hospital (07/22/18) with newly diagnosed PE with DVT.  During previous hospitalization, received IV heparin treatment and was discharged on Xarelto  Patient presents to ED today with lethargy, decreased oral intake and reported fever.  Pharmacy consulted to dose IV heparin  Today, 07/29/2018:  Confirmatory heparin level at goal on 900 units/hr; no longer checking aPTTs   CBC: hgb stable but Plt now down to 70. Patient has been exposed to heparin recently, so could be consumption or HIT;  Seen by Onc today; not immediately concerned for HIT but did give OK to transition back to Xarelto. MD repeating Plt   Plan:   Stop heparin, resume Xarelto 15 mg PO bid (patient admitted on day #6 of Xarelto, would resume as day #7 and continue 15 mg bid for 15 more days before transitioning to 20 mg daily)  Patient to f/u with Onc as outpatient  Reuel Boom, PharmD, BCPS [phone] 07/29/2018, 1:32 PM

## 2018-07-29 NOTE — Evaluation (Signed)
SLP Cancellation Note  Patient Details Name: AERON DONAGHEY MRN: [id number] DOB: 1941-01-10   Cancelled treatment:       Reason Eval/Treat Not Completed: Other (comment);Fatigue/lethargy limiting ability to participate RN reports pt has been lethargic today, No po documented at this time likely due to lethargy/somnolence. Will continue efforts.    Luanna Salk, MS [facility] SLP Acute Rehab Services Pager [phone] Office [phone]   Macario Golds 07/29/2018, 12:01 PM

## 2018-07-29 NOTE — Evaluation (Signed)
Occupational Therapy Evaluation Patient Details Name: Luis Cisneros MRN: [id number] DOB: 06-09-1940 Today's Date: 07/29/2018    History of Present Illness 78 y.o. male with medical history significant of recurrent pulmonary embolism, hyperlipidemia, hypertension, depression who presents to the emergency department after he was brought by his wife from home with complaints of generalized weakness, fever, confusion.  He was discharged from here on 07/22/2018 after he was diagnosed with pulmonary embolism, bilateral lower extremity DVT.  At that time CT chest showed right heart strain. admitted with weakness, confusion   Clinical Impression   This 78 y/o male presents with the above. Prior to initial admission/discharge on 6/16 pt very independent with ADL, iADL and functional mobility. Since most recent discharge and current admission pt spouse reports she has been assisting with ADL, pt requiring increased effort for mobility. Pt sleepy this session and only agreeable to bed level eval (spouse reports pt has been OOB to recliner earlier today). Pt presenting with decreased activity tolerance, requiring rest breaks with UB/LB exercises at bed level today. Pt completed simple grooming ADL with setup/minguard assist and cues for carrying out ADL task. He will benefit from continued acute OT services, recommend follow up [place] services to maximize his safety and independence with ADL and mobility after discharge home. Will continue to follow.     Follow Up Recommendations  Home health OT;Supervision/Assistance - 24 hour    Equipment Recommendations  None recommended by OT           Precautions / Restrictions Precautions Precautions: Fall Precaution Comments: monitor O2 sats Restrictions Weight Bearing Restrictions: No      Mobility Bed Mobility               General bed mobility comments: pt declined EOB/OOB activity this session                         ADL either  performed or assessed with clinical judgement   ADL Overall ADL's : Needs assistance/impaired Eating/Feeding: Set up;Sitting;Bed level   Grooming: Wash/dry face;Min guard;Bed level Grooming Details (indicate cue type and reason): cueing for carrying through with task                                General ADL Comments: bed level eval completed as pt declines OOB, pt spouse present and reports pt was up to chair for approx 1.5-2 hrs earlier this AM     Vision         Perception     Praxis      Pertinent Vitals/Pain Pain Assessment: Faces Faces Pain Scale: No hurt Pain Intervention(s): Monitored during session     Hand Dominance Right   Extremity/Trunk Assessment Upper Extremity Assessment Upper Extremity Assessment: Overall WFL for tasks assessed   Lower Extremity Assessment Lower Extremity Assessment: Defer to PT evaluation       Communication Communication Communication: HOH   Cognition Arousal/Alertness: Awake/alert(sleepy) Behavior During Therapy: WFL for tasks assessed/performed Overall Cognitive Status: Impaired/Different from baseline Area of Impairment: Attention;Following commands;Problem solving                   Current Attention Level: Sustained   Following Commands: Follows one step commands with increased time Safety/Judgement: Decreased awareness of safety;Decreased awareness of deficits   Problem Solving: Slow processing;Decreased initiation;Difficulty sequencing;Requires verbal cues;Requires tactile cues General Comments: pt requires multimodal cues to follow commands  and attend to/carry through with task; pt also HOH which suspect adds to difficulty with command following    General Comments  pt on 3L O2 with VSS    Exercises Exercises: General Upper Extremity;General Lower Extremity General Exercises - Upper Extremity Shoulder Flexion: Both;10 reps;Supine;AROM;AAROM General Exercises - Lower Extremity Ankle  Circles/Pumps: AROM;Both;10 reps;Supine Hip Flexion/Marching: AROM;AAROM;Both;10 reps;Supine   Shoulder Instructions      Home Living Family/patient expects to be discharged to:: Private residence Living Arrangements: Spouse/significant other Available Help at Discharge: Family Type of Home: House Home Access: Stairs to enter Technical brewer of Steps: 2-3 Entrance Stairs-Rails: Can reach both Home Layout: Two level;Bed/bath upstairs Alternate Level Stairs-Number of Steps: flight   Bathroom Shower/Tub: Teacher, early years/pre: Standard     Home Equipment: Environmental consultant - 2 wheels;Grab bars - toilet          Prior Functioning/Environment Level of Independence: Independent        Comments: independent at baseline prior to previous admission        OT Problem List: Decreased strength;Decreased range of motion;Decreased activity tolerance;Cardiopulmonary status limiting activity;Decreased knowledge of use of DME or AE;Decreased safety awareness;Decreased cognition      OT Treatment/Interventions: Self-care/ADL training;Therapeutic exercise;Energy conservation;DME and/or AE instruction;Therapeutic activities;Patient/family education;Balance training;Cognitive remediation/compensation    OT Goals(Current goals can be found in the care plan section) Acute Rehab OT Goals Patient Stated Goal: get better OT Goal Formulation: With patient Time For Goal Achievement: 08/12/18 Potential to Achieve Goals: Good  OT Frequency: Min 2X/week   Barriers to D/C:            Co-evaluation              AM-PAC OT "6 Clicks" Daily Activity     Outcome Measure Help from another person eating meals?: None Help from another person taking care of personal grooming?: A Little Help from another person toileting, which includes using toliet, bedpan, or urinal?: Total Help from another person bathing (including washing, rinsing, drying)?: A Lot Help from another person to put  on and taking off regular upper body clothing?: A Lot Help from another person to put on and taking off regular lower body clothing?: Total 6 Click Score: 13   End of Session Equipment Utilized During Treatment: Oxygen Nurse Communication: Mobility status  Activity Tolerance: Patient tolerated treatment well;Patient limited by fatigue Patient left: in bed;with call bell/phone within reach;with family/visitor present  OT Visit Diagnosis: Muscle weakness (generalized) (M62.81);Other abnormalities of gait and mobility (R26.89)                Time: 5947-0761 OT Time Calculation (min): 22 min Charges:  OT General Charges $OT Visit: 1 Visit OT Evaluation $OT Eval Moderate Complexity: [place], OT [employer] Pager [phone] Office [place] 07/29/2018, 1:47 PM

## 2018-07-29 NOTE — TOC Transition Note (Signed)
Transition of Care [facility]) - CM/SW Discharge Note   Patient Details  Name: Luis Cisneros MRN: [id number] Date of Birth: [birth year]  Transition [facility] Care [facility] CM/SW Contact:  Dessa Phi, RN Phone Number: 07/29/2018, 3:39 PM   Clinical Narrative:  Ordered for HHPT/OT/aide-family only wanting HHPT/aide-[facility] aware,& MD aware. D/c home today.     Final next level of care: [place]     Patient Goals and CMS Choice        Discharge Placement                       Discharge Plan and Services                          HH Arranged: PT, Nurse's Aide(Patient ordered for HHPT/OT/aide-only PT,aide is what patient wants.) HH Agency: [facility] Date HH Agency Contacted: 07/29/18 Time [place]: [place] Representative spoke with at [place]: [place]  Social Determinants of Health ([place]) Interventions     Readmission Risk Interventions No flowsheet data found.

## 2018-07-29 NOTE — Discharge Summary (Signed)
Physician Discharge Summary  WINDSOR GOEKEN FKC:[id number] DOB: 01/30/1941 DOA: 07/27/2018  PCP: Elby Showers, MD  Admit date: 07/27/2018 Discharge date: 07/29/2018  Admitted From: Home Disposition: Home with [facility]  Recommendations for Outpatient Follow-up:  1. Follow up with PCP in 1-2 weeks 2. Follow up with Hematology Dr. Lindi Adie within 1 week  3. Please obtain CMP/CBC, Mag, Phos in one week 4. Please follow up on the following pending results: HIT Antibody  Home Health: Yes Equipment/Devices: None recommended by PT/OT  Discharge Condition: Stable CODE STATUS: FULL CODE  Diet recommendation: Heart Healthy Diet   Brief/Interim Summary: HPI per Dr. Shelly Coss on 07/27/2018 Alysia Penna Poseyis a 78 y.o.malewith medical history significant ofrecurrent pulmonary embolism, hyperlipidemia, hypertension, depression who presents to the emergency department after he was brought by his wife from home with complaints of generalized weakness, fever, confusion. He was discharged from here on 07/22/2018 after he was diagnosed with pulmonary embolism, bilaterallower extremity DVT. At that time CT chest showed right heart strain. He was initially started on IV anticoagulation which was changed to Xarelto on discharge. Hospital course was remarkable for persistent hypoxia and his oxygenation did not improve more than 94% on room air. He was discharged on oxygen to home. Patient did not feel really well after he was discharged. He persistently has shortness of breath. His oxygenation was also checked at home and it was ranging 85 to 87% on 2 L oxygen/per minute. Information was also gathered from wife on phone. She thinks that he issignificantly dehydrated, also has dark urine. He was having 2 or 3 episodes of fever which was mild. Was also shaking. He was also seen by his PCP as an outpatient and was suspected of havinginfection. Patient had poor appetite. Patient seen and  examined the bedside in the emergency department. Currently hemodynamically stable. Wife denied that he was not exposed to any people tested positive for COVID--19. His COVID test done earlier this hospitalization was negative. Current COVID test is pending.  **Interim History Patient continued to be drowsy and somnolent and mildly confused yesterday but today he was much more alert and per wife back to baseline  He is hard of hearing.  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  Oncology was consulted for PE and recommending continuing Xarelto at this time we will follow-up with him in 1 week.  Pill count did drop to 62,000 however there is no signs and symptoms of bleeding and the timing is hit but patient will follow-up with medical oncology Dr. Lindi Adie and will on week to follow-up the results.  His respiratory status improved and his IV fluids were stopped.  He is deemed stable for discharge and will need to follow-up with PCP, and hematology in outpatient setting and he will continue his home health services.  Discharge Diagnoses:  Active Problems:   Hyperlipidemia   History of pulmonary embolism   HTN (hypertension)   Depression   Transaminitis   Generalized weakness   Fever   Hematuria  Generalized weakness/confusion and drowsiness, improved significantly -Patient was reported to be hypoxic at home even on oxygen supplementation.  -Rule out anticoagulation failure with Xarelto,persistent embolic burden in the lungs. -Checked CT chest again with contrast and showed "Known nearly occlusive pulmonary embolus within a basilar segment of the left lower lobar pulmonary artery with similar clot burden. 2. Peripheral opacity in the left lung base, distal to the embolus, may represent a pulmonary mass or developing  pulmonary infarction." It also showed "Several right-sided pulmonary nodules, the largest of which measures 9 mm." -Continue oxygen supplementation. His  confusion could be associated with hypoxia but this is now improved and likely was secondary to dehydration -ABG as below -Patient remained drowsy and not back to baseline yesterday.  He appears very fatigued and wife states that his normal baseline is he is alert and talking however patient keeps falling asleep and on contact him is extremely hard of hearing -Today he is back to baseline  History of recurrent pulmonary embolism -Just discharged from here a few days ago after being treated for pulmonary embolism with right heart strain.  -He was discharged on Xarelto.He is on day 6 of Xarelto 15 BID. -He had an episode of pulmonary embolism 2006.  -On last hospitalization, he was also found to have bilateral DVTs. -Continue IV heparin for now. -Will have Hematology/Oncology Weigh in for further evaluation and Reccs -Dr. Quintella Reichert evaluated and will follow-up on lupus anticoagulant test and will recommend continuing Xarelto at this time  Acute on Chronic Respiratory Failure with Hypoxia, improved and back to baseline -As above -C/w supplemental O2 via [place] and Wean O2 as tolerated -ABG done showed 7.472/30 3.4/60 9.1/20 4.1/93.9 on 3 L of oxygen -Back to baseline oxygen now  Abnormal LFTs Hyperbilirubinemia -Unknown etiology. Hepatitis B panel was negative on last admission. Other hepatitis panel was not checked. -Recheck hepatitis panel Negative.  -CT abdomen/pelvis done on 07/18/18 did not show any cholelithiasis or biliary dilation.  -AST went from 102 -> 97 -> 108 and ALT went from 56 -> 47 -> 43 -He also has mild thrombocytopenia. Will check INR and was 1.7 and PT was 20.0, ammonia level was 35 -Will obtain right upper quadrant ultrasound, ultrasound Doppler of the liver. -RUQ U/S showed Gallbladder sludge without sonographic evidence for acute cholecystitis Echogenic liver suggesting steatosis and or hepatocellular disease. Cyst in the left hepatic lobe. Doppler was  normal -No history of alcohol abuse. 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 Ladona Mow will follow up with the patient within 1 week and follow-up on the HIT antibody and if necessary SRA  Fever -Afebrile on presentation. Wife says he had febrile episodes at home and has not had any in the hospital for last 24 to 48 hours  -Will get urine culture, blood culture. Hold antibiotics for now. -This was negative urine culture showed multiple species present -SARS-CoV-2 testing was negative -Blood cultures x2 never drawn on admission but were drawn yesterday and showed no growth to date at less than 12 hours  Hypertension -Currently blood pressure stable. Not on blood pressure medication at home.  History of hyperlipidemia -He was previously on a statin. Statin discontinued on last admission due to elevated liver enzymes. -Follow-up with PCP  Depression -Continue Wellbutrin; Recently dose was adjusted up to 300  Deconditioning/debility -He already has home health set up. -Will obtain PT/OT Consultation and they are recommending home health again  Hyponatremia -Mild at 132 -Given IV fluid hydration and systolic and also replete with sodium phosphate IV 20 mmol -Continue to monitor and repeat CMP in the outpatient  setting  Hypophosphatemia -Patient's phosphorus level this morning was 2.0 -Replete with IV Sodium Phos -Continue monitor replete as necessary -Repeat phosphorus level in outpatient setting  Discharge Instructions  Discharge Instructions    Call MD for:  difficulty breathing, headache or  visual disturbances   Complete by: As directed    Call MD for:  extreme fatigue   Complete by: As directed    Call MD for:  hives   Complete by: As directed    Call MD for:  persistant dizziness or light-headedness   Complete by: As directed    Call MD for:  persistant nausea and vomiting   Complete by: As directed    Call MD for:  redness, tenderness, or signs of infection (pain, swelling, redness, odor or green/yellow discharge around incision site)   Complete by: As directed    Call MD for:  severe uncontrolled pain   Complete by: As directed    Call MD for:  temperature >100.4   Complete by: As directed    Diet - low sodium heart healthy   Complete by: As directed    Discharge instructions   Complete by: As directed    You were cared for by a hospitalist during your hospital stay. If you have any questions about your discharge medications or the care you received while you were in the hospital after you are discharged, you can call the unit and ask to speak with the hospitalist on call if the hospitalist that took care of you is not available. Once you are discharged, your primary care physician will handle any further medical issues. Please note that NO REFILLS for any discharge medications will be authorized once you are discharged, as it is imperative that you return to your primary care physician (or establish a relationship with a primary care physician if you do not have one) for your aftercare needs so that they can reassess your need for medications and monitor your lab values.  Follow up with PCP and Medical Oncology. Take all medications as prescribed. If symptoms change or worsen please return to the ED for evaluation   Increase activity slowly   Complete by: As directed      Allergies as of 07/29/2018      Reactions   Cephalexin Nausea Only   Feldene [piroxicam] Hives, Nausea Only, Other (See Comments)   Makes pt feel loopy      Medication List    STOP taking these  medications   hydrocortisone 1 % lotion     TAKE these medications   buPROPion 300 MG 24 hr tablet Commonly known as: WELLBUTRIN XL TAKE 1 TABLET BY MOUTH EVERY DAY   camphor-menthol lotion Commonly known as: SARNA Apply 1 application topically 2 (two) times a day.   feeding supplement (ENSURE ENLIVE) Liqd Take 237 mLs by mouth daily.   multivitamin tablet Take 1 tablet by mouth daily.   Rivaroxaban 15 & 20 MG Tbpk Take as directed on package: Start with one 15mg  tablet by mouth twice a day with food. On Day 22, switch to one 20mg  tablet once a day with food. What changed:   how much to take  how to take this  when to take this  additional instructions   senna 8.6 MG Tabs tablet Commonly known as: SENOKOT Take 1 tablet (8.6 mg total) by mouth at bedtime. What changed: when to take this   traMADol 50 MG tablet Commonly known as: ULTRAM Take 1 tablet (50 mg  total) by mouth every 6 (six) hours as needed. What changed: reasons to take this   Vitamin D3 250 MCG (10000 UT) capsule Take 10,000 Units by mouth daily.      Follow-up Information    Baxley, Cresenciano Lick, MD. Call.   Specialty: Internal Medicine Why: Follow up within 1 week  Contact information: [address] [place] [address] [phone]        Nicholas Lose, MD. Call.   Specialty: Hematology and Oncology Why: Follow up within 1 week   Contact information: [place] [phone] [place], [facility] Follow up.   Specialty: Home Health Services Why: [facility] Contact information: [address] [address] [phone]          Allergies  Allergen Reactions  . Cephalexin Nausea Only  . Feldene [Piroxicam] Hives, Nausea Only and Other (See Comments)    Makes pt feel loopy   Consultations:  Hematology/Oncology Dr. Lindi Adie  Procedures/Studies: Dg Chest 2 View  Result Date:  07/27/2018 CLINICAL DATA:  Hypoxia and altered mental status. EXAM: CHEST - 2 VIEW COMPARISON:  07/20/2018 and 07/04/2018 FINDINGS: Lungs are adequately inflated without focal lobar consolidation or effusion. Cardiomediastinal silhouette and remainder the exam is unchanged. IMPRESSION: No active cardiopulmonary disease. Electronically Signed   By: Marin Olp M.D.   On: 07/27/2018 12:31   Dg Chest 2 View  Result Date: 07/04/2018 CLINICAL DATA:  Productive cough, shortness of breath EXAM: CHEST - 2 VIEW COMPARISON:  04/16/2014 FINDINGS: Heart and mediastinal contours are within normal limits. No focal opacities or effusions. No acute bony abnormality. IMPRESSION: No active cardiopulmonary disease. Electronically Signed   By: Rolm Baptise M.D.   On: 07/04/2018 11:45   Ct Angio Chest Pe W Or Wo Contrast  Result Date: 07/27/2018 CLINICAL DATA:  Dyspnea. History of pulmonary embolus. EXAM: CT ANGIOGRAPHY CHEST WITH CONTRAST TECHNIQUE: Multidetector CT imaging of the chest was performed using the standard protocol during bolus administration of intravenous contrast. Multiplanar CT image reconstructions and MIPs were obtained to evaluate the vascular anatomy. CONTRAST:  162mL OMNIPAQUE IOHEXOL 350 MG/ML SOLN COMPARISON:  07/18/2018 FINDINGS: Cardiovascular: Again seen is nearly occlusive pulmonary embolus within a basilar segment of the left lower lobe with a similar clot burden. RV/LV ratio equals 1. There is a small pericardial effusion with maximum thickness of 7 mm at the base of the heart. Mediastinum/Nodes: Borderline enlarged mediastinal lymph nodes have increased since the last exam, and measure up to 13 mm in short axis. Normal appearance of the thyroid gland and esophagus. Lungs/Pleura: Peripheral opacity in the left lung base, distal to the pulmonary embolus may represent a subpleural pulmonary mass or an area of developing pulmonary infarction. Minimal left pleural effusion. There are several  right-sided pulmonary nodules. Mainly, there is a 9 mm pulmonary nodule in the right lower lobe, image 89/157, sequence 10. Smaller more anterior 5 mm pulmonary nodule is also seen in the right lower lobe, image 88/157, sequence 10. A 5 mm perifissural pulmonary nodule is seen in the right middle lobe, image 82/157, sequence 10. Upper Abdomen: 1.9 cm water density left hepatic lobe mass likely represents a cyst. Musculoskeletal: Multilevel spondylosis of the thoracic spine. Review of the MIP images confirms the above findings. IMPRESSION: 1. Known nearly occlusive pulmonary embolus within a basilar segment of the left lower lobar pulmonary artery with similar clot burden. 2. Peripheral opacity in the left  lung base, distal to the embolus, may represent a pulmonary mass or developing pulmonary infarction. 3. Several right-sided pulmonary nodules, the largest of which measures 9 mm. Non-contrast chest CT at 3-6 months is recommended. If the nodules are stable at time of repeat CT, then future CT at 18-24 months (from today's scan) is considered optional for low-risk patients, but is recommended for high-risk patients. This recommendation follows the consensus statement: Guidelines for Management of Incidental Pulmonary Nodules Detected on CT Images: From the Fleischner Society 2017; Radiology 2017; 284:228-243. Aortic Atherosclerosis (ICD10-I70.0). Electronically Signed   By: Fidela Salisbury M.D.   On: 07/27/2018 18:05   Ct Angio Chest Pe W And/or Wo Contrast  Result Date: 07/18/2018 CLINICAL DATA:  Leg weakness with exertion EXAM: CT ANGIOGRAPHY CHEST WITH CONTRAST TECHNIQUE: Multidetector CT imaging of the chest was performed using the standard protocol during bolus administration of intravenous contrast. Multiplanar CT image reconstructions and MIPs were obtained to evaluate the vascular anatomy. CONTRAST:  165mL OMNIPAQUE IOHEXOL 350 MG/ML SOLN COMPARISON:  11/17/2009 FINDINGS: Cardiovascular: Examination  for pulmonary embolism is limited by patient and breath motion artifact throughout. Within this limitation, positive examination for pulmonary embolism, with segmental embolus present in the left lung base (series 6, image 53). There is no definite embolus identified elsewhere. The RV:LV ratio is enlarged, measuring up to 1.5:1. Left coronary artery calcifications. No pericardial effusion. Mediastinum/Nodes: No enlarged mediastinal, hilar, or axillary lymph nodes. Thyroid gland, trachea, and esophagus demonstrate no significant findings. Lungs/Pleura: There is a subpleural heterogeneous opacity of the left lung base distal to embolus, concerning for developing infarction. No pleural effusion or pneumothorax. Upper Abdomen: No acute abnormality. Musculoskeletal: No chest wall abnormality. No acute or significant osseous findings. Review of the MIP images confirms the above findings. IMPRESSION: 1. Examination for pulmonary embolism is limited by patient and breath motion artifact throughout. Within this limitation, positive examination for pulmonary embolism, with segmental embolus present in the left lung base (series 6, image 53). There is no definite embolus identified elsewhere. 2. There is a subpleural heterogeneous opacity of the left lung base distal to embolus, concerning for developing infarction. 3. The RV:LV ratio is enlarged, measuring up to 1.5:1. This is concerning for right heart strain by criteria in the setting of pulmonary embolism, however there is a relatively small, distal burden of embolus. Correlate with clinical concern for right heart strain and echocardiogram. 4.  Coronary artery disease. These results were called by telephone at the time of interpretation on 07/18/2018 at 3:33 pm to Dr. Virgel Manifold , who verbally acknowledged these results. Electronically Signed   By: Eddie Candle M.D.   On: 07/18/2018 15:36   Ct Abdomen Pelvis W Contrast  Result Date: 07/18/2018 CLINICAL DATA:   Painless jaundice, weight loss. EXAM: CT ABDOMEN AND PELVIS WITH CONTRAST TECHNIQUE: Multidetector CT imaging of the abdomen and pelvis was performed using the standard protocol following bolus administration of intravenous contrast. CONTRAST:  129mL OMNIPAQUE IOHEXOL 350 MG/ML SOLN COMPARISON:  CT scan of November 17, 2009. FINDINGS: Lower chest: No acute abnormality. Hepatobiliary: No cholelithiasis or biliary dilatation is noted. Simple left hepatic cyst is noted. Pancreas: Unremarkable. No pancreatic ductal dilatation or surrounding inflammatory changes. Spleen: Normal in size without focal abnormality. Adrenals/Urinary Tract: Left adrenal gland appears normal. Stable calcified right adrenal myelolipoma is noted. Small nonobstructive calculus seen in upper pole calyx of left kidney. Mild left hydronephrosis is noted secondary to 7 mm calculus in proximal left ureter. Urinary bladder is unremarkable.  Stomach/Bowel: The stomach appears normal. There is no evidence of bowel obstruction or inflammation. The appendix is not visualized. Vascular/Lymphatic: Aortic atherosclerosis. No enlarged abdominal or pelvic lymph nodes. Reproductive: Mild prostatic enlargement is noted. Other: No abdominal wall hernia or abnormality. No abdominopelvic ascites. Musculoskeletal: Status post left total hip arthroplasty. No acute osseous abnormality is noted. IMPRESSION: Mild left hydronephrosis is noted secondary to 7 mm proximal left ureteral calculus. Stable partially calcified right adrenal myelolipoma is noted. Mild prostatic enlargement. Aortic Atherosclerosis (ICD10-I70.0). Electronically Signed   By: Marijo Conception M.D.   On: 07/18/2018 15:51   US Renal  Result Date: 07/27/2018 CLINICAL DATA:  Hematuria EXAM: RENAL / URINARY TRACT ULTRASOUND COMPLETE COMPARISON:  CT 07/18/2018 FINDINGS: Right Kidney: Renal measurements: 12.2 x 5.6 x 6.2 cm = volume: 219.4 mL . Echogenicity within normal limits. No mass or hydronephrosis  visualized. Left Kidney: Renal measurements: 13.8 x 5.7 x 6.7 cm = volume: 276 mL. Cortical echogenicity within normal limits. No hydronephrosis. Left parapelvic cyst measuring up to 11 mm in the lower pole. Bladder: Appears normal for degree of bladder distention. Mild debris in the urinary bladder IMPRESSION: 1. Negative for hydronephrosis or shadowing stone. Small cyst in the left kidney 2. Mild debris in the urinary bladder Electronically Signed   By: Donavan Foil M.D.   On: 07/27/2018 17:19   Dg Chest Port 1 View  Result Date: 07/20/2018 CLINICAL DATA:  Shortness of breath EXAM: PORTABLE CHEST 1 VIEW COMPARISON:  Jul 04, 2018 FINDINGS: The heart, hila, and mediastinum are normal. No pneumothorax. No nodules or masses. A small left pleural effusion is not excluded on this study. A PA and lateral chest x-ray could better evaluate. IMPRESSION: Question of a small left pleural effusion. A PA and lateral chest x-ray could better evaluate if clinically warranted. Electronically Signed   By: Dorise Bullion III M.D   On: 07/20/2018 08:53   US Liver Doppler  Result Date: 07/28/2018 CLINICAL DATA:  Elevated LFTs EXAM: DUPLEX ULTRASOUND OF LIVER TECHNIQUE: Color and duplex Doppler ultrasound was performed to evaluate the hepatic in-flow and out-flow vessels. COMPARISON:  07/27/2018 FINDINGS: Portal Vein Velocities Main:  25 cm/sec Right:  28 cm/sec Left:  17 cm/sec Hepatic Vein Velocities Right:  98 cm/sec Middle:  44 cm/sec Left:  38 cm/sec Hepatic Artery Velocity:  112 cm/sec Splenic Vein Velocity:  19 cm/sec Varices: Not visualized Ascites: Visualized Patent portal, hepatic and splenic veins with normal directional flow. Negative for portal vein occlusion or thrombus. IMPRESSION: Normal hepatic venous Doppler. Electronically Signed   By: Jerilynn Mages.  Shick M.D.   On: 07/28/2018 20:47   Vas [place] Lower Extremity Venous (dvt)  Result Date: 07/20/2018  Lower Venous Study Indications: Pulmonary embolism.  Comparison  Study: No prior study Performing Technologist: Maudry Mayhew MHA, RDMS, RVT, RDCS  Examination Guidelines: A complete evaluation includes B-mode imaging, spectral Doppler, color Doppler, and power Doppler as needed of all accessible portions of each vessel. Bilateral testing is considered an integral part of a complete examination. Limited examinations for reoccurring indications may be performed as noted.  +---------+---------------+---------+-----------+----------+--------------+ RIGHT    CompressibilityPhasicitySpontaneityPropertiesSummary        +---------+---------------+---------+-----------+----------+--------------+ CFV      Full           No       Yes                  Pulsatile flow +---------+---------------+---------+-----------+----------+--------------+ SFJ      Full                                                        +---------+---------------+---------+-----------+----------+--------------+  FV Prox  None                    No                   Acute          +---------+---------------+---------+-----------+----------+--------------+ FV Mid   Full                                                        +---------+---------------+---------+-----------+----------+--------------+ FV DistalFull                                                        +---------+---------------+---------+-----------+----------+--------------+ PFV      None                    No                   Acute          +---------+---------------+---------+-----------+----------+--------------+ POP      Full           No       Yes                  Pulsatile flow +---------+---------------+---------+-----------+----------+--------------+ PTV      Full                                                        +---------+---------------+---------+-----------+----------+--------------+ PERO     Full                                                         +---------+---------------+---------+-----------+----------+--------------+   +---------+---------------+---------+-----------+----------+--------------+ LEFT     CompressibilityPhasicitySpontaneityPropertiesSummary        +---------+---------------+---------+-----------+----------+--------------+ CFV      Full           No       Yes                  Pulsatile flow +---------+---------------+---------+-----------+----------+--------------+ SFJ      Full                                                        +---------+---------------+---------+-----------+----------+--------------+ FV Prox  None                    No                   Acute          +---------+---------------+---------+-----------+----------+--------------+ FV Mid   Full                                                        +---------+---------------+---------+-----------+----------+--------------+  FV DistalNone                    No                   Acute          +---------+---------------+---------+-----------+----------+--------------+ PFV      Full                                                        +---------+---------------+---------+-----------+----------+--------------+ POP      Full                                         Pulsatile flow +---------+---------------+---------+-----------+----------+--------------+ PTV      Full                                                        +---------+---------------+---------+-----------+----------+--------------+ PERO     Full                                                        +---------+---------------+---------+-----------+----------+--------------+ Gastroc  None                    No                   Acute          +---------+---------------+---------+-----------+----------+--------------+     Summary: Right: Findings consistent with acute deep vein thrombosis involving the right femoral vein, and right  proximal profunda vein. No cystic structure found in the popliteal fossa. Left: Findings consistent with acute deep vein thrombosis involving the left femoral vein, and left gastrocnemius vein. No cystic structure found in the popliteal fossa.  Venous flow is pulsatile bilaterally, suggestive of possibly elevated right-sided heart pressure. *See table(s) above for measurements and observations. Electronically signed by Harold Barban MD on 07/20/2018 at 4:35:55 PM.    Final    US Abdomen Limited Ruq  Result Date: 07/27/2018 CLINICAL DATA:  Elevated LFT EXAM: ULTRASOUND ABDOMEN LIMITED RIGHT UPPER QUADRANT COMPARISON:  CT 07/18/2018 FINDINGS: Gallbladder: Moderate sludge. No shadowing stone. Normal wall thickness. Negative sonographic Murphy. Common bile duct: Diameter: 3 mm Liver: Increased hepatic echogenicity. 2.2 cm cyst in the left hepatic lobe. Portal vein is patent on color Doppler imaging with normal direction of blood flow towards the liver. IMPRESSION: 1. Gallbladder sludge without sonographic evidence for acute cholecystitis 2. Echogenic liver suggesting steatosis and or hepatocellular disease. Cyst in the left hepatic lobe Electronically Signed   By: Donavan Foil M.D.   On: 07/27/2018 17:21     Subjective: Seen and examined at bedside he was awake and alert and oriented and back to baseline per his wife.  Denies any chest pain, headache or dizziness.  Was a little puffy from the fluid resuscitation so fluids were stopped.  No other concerns or plans at this  time and ready to go home.  Discharge Exam: Vitals:   07/29/18 0502 07/29/18 1510  BP: 126/74 122/68  Pulse: (!) 106 (!) 108  Resp: 14 19  Temp: 100.1 F (37.8 C) 98.6 F (37 C)  SpO2: 95% 97%   Vitals:   07/28/18 2058 07/28/18 2225 07/29/18 0502 07/29/18 1510  BP:  109/62 126/74 122/68  Pulse:  (!) 103 (!) 106 (!) 108  Resp:  16 14 19   Temp:  99.1 F (37.3 C) 100.1 F (37.8 C) 98.6 F (37 C)  TempSrc:  Axillary Oral Oral   SpO2: 94% 93% 95% 97%  Weight:      Height:       General: Pt is alert, awake, not in acute distress Cardiovascular: RRR, S1/S2 +, no rubs, no gallops Respiratory: Diminished bilaterally, no wheezing, no rhonchi; Unlabored Breathing and wearing supplemental O2 via [place] Abdominal: Soft, NT, Distended , bowel sounds + Extremities: Trace- Mild LE edema, no cyanosis  The results of significant diagnostics from this hospitalization (including imaging, microbiology, ancillary and laboratory) are listed below for reference.    Microbiology: Recent Results (from the past 240 hour(s))  Urine culture     Status: Abnormal   Collection Time: 07/27/18 11:46 AM   Specimen: Urine, Random  Result Value Ref Range Status   Specimen Description   Final    URINE, RANDOM Performed at [facility] [address], [place], [place] [address]    Special Requests   Final    NONE Performed at [facility], [place] [address], [place], [place] [address]    Culture MULTIPLE SPECIES PRESENT, SUGGEST RECOLLECTION (A)  Final   Report Status 07/28/2018 FINAL  Final  SARS Coronavirus 2 (CEPHEID - Performed in [place] hospital lab), Hosp Order     Status: None   Collection Time: 07/27/18 11:47 AM   Specimen: Nasopharyngeal Swab  Result Value Ref Range Status   SARS Coronavirus 2 NEGATIVE NEGATIVE Final    Comment: (NOTE) If result is NEGATIVE SARS-CoV-2 target nucleic acids are NOT DETECTED. The SARS-CoV-2 RNA is generally detectable in upper and lower  respiratory specimens during the acute phase of infection. The lowest  concentration of SARS-CoV-2 viral copies this assay can detect is 250  copies / mL. A negative result does not preclude SARS-CoV-2 infection  and should not be used as the sole basis for treatment or other  patient management decisions.  A negative result may occur with  improper specimen collection / handling, submission of specimen other  than  nasopharyngeal swab, presence of viral mutation(s) within the  areas targeted by this assay, and inadequate number of viral copies  (<250 copies / mL). A negative result must be combined with clinical  observations, patient history, and epidemiological information. If result is POSITIVE SARS-CoV-2 target nucleic acids are DETECTED. The SARS-CoV-2 RNA is generally detectable in upper and lower  respiratory specimens dur ing the acute phase of infection.  Positive  results are indicative of active infection with SARS-CoV-2.  Clinical  correlation with patient history and other diagnostic information is  necessary to determine patient infection status.  Positive results do  not rule out bacterial infection or co-infection with other viruses. If result is PRESUMPTIVE POSTIVE SARS-CoV-2 nucleic acids MAY BE PRESENT.   A presumptive positive result was obtained on the submitted specimen  and confirmed on repeat testing.  While 2019 novel coronavirus  (SARS-CoV-2) nucleic acids may be present in the submitted sample  additional confirmatory testing may be necessary for epidemiological  and / or clinical management purposes  to differentiate between  SARS-CoV-2 and other Sarbecovirus currently known to infect humans.  If clinically indicated additional testing with an alternate test  methodology [phone]) is advised. The SARS-CoV-2 RNA is generally  detectable in upper and lower respiratory sp ecimens during the acute  phase of infection. The expected result is Negative. Fact Sheet for Patients:  StrictlyIdeas.no Fact Sheet for Healthcare Providers: BankingDealers.co.za This test is not yet approved or cleared by the [place] FDA and has been authorized for detection and/or diagnosis of SARS-CoV-2 by FDA under an Emergency Use Authorization (EUA).  This EUA will remain in effect (meaning this test can be used) for the duration of  the COVID-19 declaration under Section 564(b)(1) of the Act, 21 U.S.C. section 360bbb-3(b)(1), unless the authorization is terminated or revoked sooner. Performed at [facility], [place] [address] [place], [place] [address]   Culture, blood (routine x 2)     Status: None (Preliminary result)   Collection Time: 07/28/18  8:49 PM   Specimen: BLOOD  Result Value Ref Range Status   Specimen Description   Final    BLOOD BLOOD RIGHT HAND Performed at [facility] [address], [place], [place] [address]    Special Requests   Final    BOTTLES DRAWN AEROBIC AND ANAEROBIC Blood Culture adequate volume Performed at [facility] [address], [place], [place] [address]    Culture   Final    NO GROWTH < 12 HOURS Performed at [facility] [address], [place], [place] [address]    Report Status PENDING  Incomplete  Culture, blood (routine x 2)     Status: None (Preliminary result)   Collection Time: 07/28/18  8:49 PM   Specimen: BLOOD  Result Value Ref Range Status   Specimen Description   Final    BLOOD BLOOD RIGHT ARM Performed at [facility] [address] [place], [place] [address]    Special Requests   Final    BOTTLES DRAWN AEROBIC AND ANAEROBIC Blood Culture adequate volume Performed at [facility] [address] [place], [place] [address]    Culture   Final    NO GROWTH < 12 HOURS Performed at [facility] [address], [place], [place] [address]    Report Status PENDING  Incomplete    Labs: BNP (last 3 results) No results for input(s): BNP in the last 8760 hours. Basic Metabolic Panel: Recent Labs  Lab 07/27/18 1146 07/28/18 0508 07/29/18 0352  NA 131* 131* 132*  K 3.9 3.9 3.9  CL 95* 99 103  CO2 25 22 22   GLUCOSE 85 72 75  BUN 14 11 13   CREATININE 0.77 0.65 0.66  CALCIUM 9.1 8.2* 8.0*  MG 2.0  --  1.9  PHOS  --   --  2.0*   Liver  Function Tests: Recent Labs  Lab 07/27/18 1146 07/28/18 0508 07/29/18 0352  AST 102* 97* 108*  ALT 56* 47* 43  ALKPHOS 321* 286* 264*  BILITOT 1.9* 2.1* 2.3*  PROT 6.5 5.7* 5.6*  ALBUMIN 2.2* 1.9* 1.9*   No results for input(s): LIPASE, AMYLASE in the last 168 hours. Recent Labs  Lab 07/27/18 1528  AMMONIA 35   CBC: Recent Labs  Lab 07/27/18 1146 07/28/18 0508 07/29/18 0352 07/29/18 1502  WBC 4.6 5.4 6.5 8.5  NEUTROABS  2.7  --  2.3 2.7  HGB 11.9* 10.5* 10.5* 10.7*  HCT 38.2* 34.1* 34.3* 34.3*  MCV 85.7 87.0 86.6 85.8  PLT 112* 101* 70* 62*   Cardiac Enzymes: No results for input(s): CKTOTAL, CKMB, CKMBINDEX, TROPONINI in the last 168 hours. BNP: Invalid input(s): POCBNP CBG: Recent Labs  Lab 07/28/18 0635  GLUCAP 90   D-Dimer No results for input(s): DDIMER in the last 72 hours. Hgb A1c No results for input(s): HGBA1C in the last 72 hours. Lipid Profile No results for input(s): CHOL, HDL, LDLCALC, TRIG, CHOLHDL, LDLDIRECT in the last 72 hours. Thyroid function studies No results for input(s): TSH, T4TOTAL, T3FREE, THYROIDAB in the last 72 hours.  Invalid input(s): FREET3 Anemia work up No results for input(s): VITAMINB12, FOLATE, FERRITIN, TIBC, IRON, RETICCTPCT in the last 72 hours. Urinalysis    Component Value Date/Time   COLORURINE AMBER (A) 07/27/2018 1146   APPEARANCEUR CLEAR 07/27/2018 1146   LABSPEC 1.027 07/27/2018 1146   PHURINE 5.0 07/27/2018 1146   GLUCOSEU NEGATIVE 07/27/2018 1146   HGBUR NEGATIVE 07/27/2018 1146   BILIRUBINUR SMALL (A) 07/27/2018 1146   BILIRUBINUR moderate (A) 07/25/2018 1645   BILIRUBINUR NEG 11/12/2017 1004   KETONESUR NEGATIVE 07/27/2018 1146   PROTEINUR 30 (A) 07/27/2018 1146   UROBILINOGEN 0.2 07/25/2018 1645   UROBILINOGEN 0.2 04/16/2014 1357   NITRITE NEGATIVE 07/27/2018 1146   LEUKOCYTESUR NEGATIVE 07/27/2018 1146   Sepsis Labs Invalid input(s): PROCALCITONIN,  WBC,  LACTICIDVEN Microbiology Recent  Results (from the past 240 hour(s))  Urine culture     Status: Abnormal   Collection Time: 07/27/18 11:46 AM   Specimen: Urine, Random  Result Value Ref Range Status   Specimen Description   Final    URINE, RANDOM Performed at [facility], [place] [address], [place], [place] [address]    Special Requests   Final    NONE Performed at [facility], [place] [address], [place], [place] [address]    Culture MULTIPLE SPECIES PRESENT, SUGGEST RECOLLECTION (A)  Final   Report Status 07/28/2018 FINAL  Final  SARS Coronavirus 2 (CEPHEID - Performed in [facility] hospital lab), Hosp Order     Status: None   Collection Time: 07/27/18 11:47 AM   Specimen: Nasopharyngeal Swab  Result Value Ref Range Status   SARS Coronavirus 2 NEGATIVE NEGATIVE Final    Comment: (NOTE) If result is NEGATIVE SARS-CoV-2 target nucleic acids are NOT DETECTED. The SARS-CoV-2 RNA is generally detectable in upper and lower  respiratory specimens during the acute phase of infection. The lowest  concentration of SARS-CoV-2 viral copies this assay can detect is 250  copies / mL. A negative result does not preclude SARS-CoV-2 infection  and should not be used as the sole basis for treatment or other  patient management decisions.  A negative result may occur with  improper specimen collection / handling, submission of specimen other  than nasopharyngeal swab, presence of viral mutation(s) within the  areas targeted by this assay, and inadequate number of viral copies  (<250 copies / mL). A negative result must be combined with clinical  observations, patient history, and epidemiological information. If result is POSITIVE SARS-CoV-2 target nucleic acids are DETECTED. The SARS-CoV-2 RNA is generally detectable in upper and lower  respiratory specimens dur ing the acute phase of infection.  Positive  results are indicative of active infection with SARS-CoV-2.  Clinical  correlation with  patient history and other diagnostic information is  necessary to  determine patient infection status.  Positive results do  not rule out bacterial infection or co-infection with other viruses. If result is PRESUMPTIVE POSTIVE SARS-CoV-2 nucleic acids MAY BE PRESENT.   A presumptive positive result was obtained on the submitted specimen  and confirmed on repeat testing.  While 2019 novel coronavirus  (SARS-CoV-2) nucleic acids may be present in the submitted sample  additional confirmatory testing may be necessary for epidemiological  and / or clinical management purposes  to differentiate between  SARS-CoV-2 and other Sarbecovirus currently known to infect humans.  If clinically indicated additional testing with an alternate test  methodology [phone]) is advised. The SARS-CoV-2 RNA is generally  detectable in upper and lower respiratory sp ecimens during the acute  phase of infection. The expected result is Negative. Fact Sheet for Patients:  StrictlyIdeas.no Fact Sheet for Healthcare Providers: BankingDealers.co.za This test is not yet approved or cleared by the [place] FDA and has been authorized for detection and/or diagnosis of SARS-CoV-2 by FDA under an Emergency Use Authorization (EUA).  This EUA will remain in effect (meaning this test can be used) for the duration of the COVID-19 declaration under Section 564(b)(1) of the Act, 21 U.S.C. section 360bbb-3(b)(1), unless the authorization is terminated or revoked sooner. Performed at [facility], [place] [address] [place], [place] [address]   Culture, blood (routine x 2)     Status: None (Preliminary result)   Collection Time: 07/28/18  8:49 PM   Specimen: BLOOD  Result Value Ref Range Status   Specimen Description   Final    BLOOD BLOOD RIGHT HAND Performed at [facility] [address], [place], [place] [address]    Special Requests    Final    BOTTLES DRAWN AEROBIC AND ANAEROBIC Blood Culture adequate volume Performed at [facility] [address] [place], [place] [address]    Culture   Final    NO GROWTH < 12 HOURS Performed at [facility] [address] [place], [place] [address]    Report Status PENDING  Incomplete  Culture, blood (routine x 2)     Status: None (Preliminary result)   Collection Time: 07/28/18  8:49 PM   Specimen: BLOOD  Result Value Ref Range Status   Specimen Description   Final    BLOOD BLOOD RIGHT ARM Performed at [facility] [address] [place], [place] [address]    Special Requests   Final    BOTTLES DRAWN AEROBIC AND ANAEROBIC Blood Culture adequate volume Performed at [facility] [address], [place], [place] [address]    Culture   Final    NO GROWTH < 12 HOURS Performed at [facility] [address] [place], [place] [address]    Report Status PENDING  Incomplete   Time coordinating discharge: 35 minutes  SIGNED:  Kerney Elbe, DO [facility] 07/29/2018, 10:19 PM Pager is on [place]  If 7PM-7AM, please contact night-coverage [email or web address] Password [email or web address]

## 2018-07-29 NOTE — Progress Notes (Signed)
Pharmacy: [facility]  Patient's a 78 y.o M with recent PE/DVT (07/22/18) on xarelto PTA (last dose taken on 6/21 at 0730), presented to the ED on 6/21 with c/o weakness, fever and confusion.  Anticoag. was changed to heparin drip on admission.  - heparin level is therapeutic at 0.40 (goal 0.3-0.7). aPTT elevated is therapeutic at  92 sec (goal 66-102 sec). Verified with pt's RN, heparin level was drawn on the opposite arm from where heparin drip is infusing. No bleeding noted. -H/H = 10.5/34.3 - stable from 6/22 -plts = 70 down from 6/22 - no bleeding documented  Plan: -continue heparin drip at 900 units/hr - recheck confirmatory HL later today - monitor for s/s bleeding    Dorrene German 07/29/2018 4:25 AM

## 2018-07-30 ENCOUNTER — Inpatient Hospital Stay (HOSPITAL_COMMUNITY)
Admission: EM | Admit: 2018-07-30 | Discharge: 2018-09-06 | DRG: 871 | Disposition: E | Payer: Medicare Other | Attending: Emergency Medicine | Admitting: Emergency Medicine

## 2018-07-30 ENCOUNTER — Telehealth: Payer: Self-pay | Admitting: Internal Medicine

## 2018-07-30 ENCOUNTER — Telehealth: Payer: Self-pay | Admitting: *Deleted

## 2018-07-30 ENCOUNTER — Encounter (HOSPITAL_COMMUNITY): Payer: Self-pay

## 2018-07-30 ENCOUNTER — Emergency Department (HOSPITAL_COMMUNITY): Payer: Medicare Other

## 2018-07-30 ENCOUNTER — Encounter: Payer: Self-pay | Admitting: Internal Medicine

## 2018-07-30 ENCOUNTER — Observation Stay (HOSPITAL_COMMUNITY): Payer: Medicare Other

## 2018-07-30 ENCOUNTER — Other Ambulatory Visit: Payer: Self-pay

## 2018-07-30 DIAGNOSIS — J811 Chronic pulmonary edema: Secondary | ICD-10-CM | POA: Diagnosis not present

## 2018-07-30 DIAGNOSIS — E43 Unspecified severe protein-calorie malnutrition: Secondary | ICD-10-CM | POA: Diagnosis not present

## 2018-07-30 DIAGNOSIS — Z79891 Long term (current) use of opiate analgesic: Secondary | ICD-10-CM | POA: Diagnosis not present

## 2018-07-30 DIAGNOSIS — Z9181 History of falling: Secondary | ICD-10-CM

## 2018-07-30 DIAGNOSIS — R000 Tachycardia, unspecified: Secondary | ICD-10-CM | POA: Diagnosis not present

## 2018-07-30 DIAGNOSIS — R404 Transient alteration of awareness: Secondary | ICD-10-CM | POA: Diagnosis not present

## 2018-07-30 DIAGNOSIS — Z0184 Encounter for antibody response examination: Secondary | ICD-10-CM

## 2018-07-30 DIAGNOSIS — D696 Thrombocytopenia, unspecified: Secondary | ICD-10-CM | POA: Diagnosis present

## 2018-07-30 DIAGNOSIS — Z87891 Personal history of nicotine dependence: Secondary | ICD-10-CM

## 2018-07-30 DIAGNOSIS — W19XXXA Unspecified fall, initial encounter: Secondary | ICD-10-CM | POA: Diagnosis present

## 2018-07-30 DIAGNOSIS — R579 Shock, unspecified: Secondary | ICD-10-CM | POA: Diagnosis not present

## 2018-07-30 DIAGNOSIS — R0682 Tachypnea, not elsewhere classified: Secondary | ICD-10-CM

## 2018-07-30 DIAGNOSIS — N170 Acute kidney failure with tubular necrosis: Secondary | ICD-10-CM | POA: Diagnosis not present

## 2018-07-30 DIAGNOSIS — R162 Hepatomegaly with splenomegaly, not elsewhere classified: Secondary | ICD-10-CM | POA: Diagnosis not present

## 2018-07-30 DIAGNOSIS — R791 Abnormal coagulation profile: Secondary | ICD-10-CM | POA: Diagnosis not present

## 2018-07-30 DIAGNOSIS — F32A Depression, unspecified: Secondary | ICD-10-CM | POA: Diagnosis present

## 2018-07-30 DIAGNOSIS — A419 Sepsis, unspecified organism: Secondary | ICD-10-CM | POA: Diagnosis not present

## 2018-07-30 DIAGNOSIS — Z7901 Long term (current) use of anticoagulants: Secondary | ICD-10-CM

## 2018-07-30 DIAGNOSIS — R748 Abnormal levels of other serum enzymes: Secondary | ICD-10-CM | POA: Diagnosis not present

## 2018-07-30 DIAGNOSIS — I700 Atherosclerosis of aorta: Secondary | ICD-10-CM | POA: Diagnosis present

## 2018-07-30 DIAGNOSIS — R918 Other nonspecific abnormal finding of lung field: Secondary | ICD-10-CM | POA: Diagnosis present

## 2018-07-30 DIAGNOSIS — Z888 Allergy status to other drugs, medicaments and biological substances status: Secondary | ICD-10-CM | POA: Diagnosis not present

## 2018-07-30 DIAGNOSIS — R601 Generalized edema: Secondary | ICD-10-CM | POA: Diagnosis present

## 2018-07-30 DIAGNOSIS — I313 Pericardial effusion (noninflammatory): Secondary | ICD-10-CM | POA: Diagnosis present

## 2018-07-30 DIAGNOSIS — I2729 Other secondary pulmonary hypertension: Secondary | ICD-10-CM | POA: Diagnosis present

## 2018-07-30 DIAGNOSIS — R0600 Dyspnea, unspecified: Secondary | ICD-10-CM

## 2018-07-30 DIAGNOSIS — R634 Abnormal weight loss: Secondary | ICD-10-CM | POA: Diagnosis present

## 2018-07-30 DIAGNOSIS — J9622 Acute and chronic respiratory failure with hypercapnia: Secondary | ICD-10-CM | POA: Diagnosis present

## 2018-07-30 DIAGNOSIS — Z6831 Body mass index (BMI) 31.0-31.9, adult: Secondary | ICD-10-CM

## 2018-07-30 DIAGNOSIS — Z66 Do not resuscitate: Secondary | ICD-10-CM | POA: Diagnosis not present

## 2018-07-30 DIAGNOSIS — I959 Hypotension, unspecified: Secondary | ICD-10-CM

## 2018-07-30 DIAGNOSIS — Y92009 Unspecified place in unspecified non-institutional (private) residence as the place of occurrence of the external cause: Secondary | ICD-10-CM

## 2018-07-30 DIAGNOSIS — C8590 Non-Hodgkin lymphoma, unspecified, unspecified site: Secondary | ICD-10-CM | POA: Diagnosis present

## 2018-07-30 DIAGNOSIS — I119 Hypertensive heart disease without heart failure: Secondary | ICD-10-CM | POA: Diagnosis present

## 2018-07-30 DIAGNOSIS — R161 Splenomegaly, not elsewhere classified: Secondary | ICD-10-CM | POA: Diagnosis not present

## 2018-07-30 DIAGNOSIS — N179 Acute kidney failure, unspecified: Secondary | ICD-10-CM | POA: Diagnosis not present

## 2018-07-30 DIAGNOSIS — D65 Disseminated intravascular coagulation [defibrination syndrome]: Secondary | ICD-10-CM | POA: Diagnosis not present

## 2018-07-30 DIAGNOSIS — Z86711 Personal history of pulmonary embolism: Secondary | ICD-10-CM

## 2018-07-30 DIAGNOSIS — R400 Somnolence: Secondary | ICD-10-CM

## 2018-07-30 DIAGNOSIS — D72829 Elevated white blood cell count, unspecified: Secondary | ICD-10-CM | POA: Diagnosis not present

## 2018-07-30 DIAGNOSIS — R0902 Hypoxemia: Secondary | ICD-10-CM | POA: Diagnosis not present

## 2018-07-30 DIAGNOSIS — Z79899 Other long term (current) drug therapy: Secondary | ICD-10-CM | POA: Diagnosis not present

## 2018-07-30 DIAGNOSIS — Z8249 Family history of ischemic heart disease and other diseases of the circulatory system: Secondary | ICD-10-CM

## 2018-07-30 DIAGNOSIS — I2699 Other pulmonary embolism without acute cor pulmonale: Secondary | ICD-10-CM | POA: Diagnosis not present

## 2018-07-30 DIAGNOSIS — R0603 Acute respiratory distress: Secondary | ICD-10-CM

## 2018-07-30 DIAGNOSIS — D72821 Monocytosis (symptomatic): Secondary | ICD-10-CM | POA: Diagnosis not present

## 2018-07-30 DIAGNOSIS — Z515 Encounter for palliative care: Secondary | ICD-10-CM | POA: Diagnosis not present

## 2018-07-30 DIAGNOSIS — D735 Infarction of spleen: Secondary | ICD-10-CM | POA: Diagnosis present

## 2018-07-30 DIAGNOSIS — Z886 Allergy status to analgesic agent status: Secondary | ICD-10-CM | POA: Diagnosis not present

## 2018-07-30 DIAGNOSIS — R591 Generalized enlarged lymph nodes: Secondary | ICD-10-CM | POA: Diagnosis present

## 2018-07-30 DIAGNOSIS — R140 Abdominal distension (gaseous): Secondary | ICD-10-CM

## 2018-07-30 DIAGNOSIS — I4891 Unspecified atrial fibrillation: Secondary | ICD-10-CM | POA: Diagnosis not present

## 2018-07-30 DIAGNOSIS — K760 Fatty (change of) liver, not elsewhere classified: Secondary | ICD-10-CM | POA: Diagnosis present

## 2018-07-30 DIAGNOSIS — J9621 Acute and chronic respiratory failure with hypoxia: Secondary | ICD-10-CM | POA: Diagnosis not present

## 2018-07-30 DIAGNOSIS — Z881 Allergy status to other antibiotic agents status: Secondary | ICD-10-CM | POA: Diagnosis not present

## 2018-07-30 DIAGNOSIS — F329 Major depressive disorder, single episode, unspecified: Secondary | ICD-10-CM | POA: Diagnosis not present

## 2018-07-30 DIAGNOSIS — E861 Hypovolemia: Secondary | ICD-10-CM | POA: Diagnosis not present

## 2018-07-30 DIAGNOSIS — E8809 Other disorders of plasma-protein metabolism, not elsewhere classified: Secondary | ICD-10-CM | POA: Diagnosis not present

## 2018-07-30 DIAGNOSIS — R21 Rash and other nonspecific skin eruption: Secondary | ICD-10-CM | POA: Diagnosis not present

## 2018-07-30 DIAGNOSIS — H9190 Unspecified hearing loss, unspecified ear: Secondary | ICD-10-CM | POA: Diagnosis present

## 2018-07-30 DIAGNOSIS — N136 Pyonephrosis: Secondary | ICD-10-CM | POA: Diagnosis present

## 2018-07-30 DIAGNOSIS — C946 Myelodysplastic disease, not classified: Secondary | ICD-10-CM | POA: Diagnosis not present

## 2018-07-30 DIAGNOSIS — R7881 Bacteremia: Secondary | ICD-10-CM | POA: Diagnosis not present

## 2018-07-30 DIAGNOSIS — G92 Toxic encephalopathy: Secondary | ICD-10-CM | POA: Diagnosis not present

## 2018-07-30 DIAGNOSIS — Z20828 Contact with and (suspected) exposure to other viral communicable diseases: Secondary | ICD-10-CM | POA: Diagnosis present

## 2018-07-30 DIAGNOSIS — R531 Weakness: Secondary | ICD-10-CM

## 2018-07-30 DIAGNOSIS — D6859 Other primary thrombophilia: Secondary | ICD-10-CM | POA: Diagnosis present

## 2018-07-30 DIAGNOSIS — E872 Acidosis, unspecified: Secondary | ICD-10-CM | POA: Diagnosis present

## 2018-07-30 DIAGNOSIS — K7200 Acute and subacute hepatic failure without coma: Secondary | ICD-10-CM | POA: Diagnosis present

## 2018-07-30 DIAGNOSIS — M1990 Unspecified osteoarthritis, unspecified site: Secondary | ICD-10-CM | POA: Diagnosis present

## 2018-07-30 DIAGNOSIS — Z86718 Personal history of other venous thrombosis and embolism: Secondary | ICD-10-CM

## 2018-07-30 DIAGNOSIS — J9691 Respiratory failure, unspecified with hypoxia: Secondary | ICD-10-CM | POA: Diagnosis not present

## 2018-07-30 DIAGNOSIS — B9562 Methicillin resistant Staphylococcus aureus infection as the cause of diseases classified elsewhere: Secondary | ICD-10-CM | POA: Diagnosis not present

## 2018-07-30 DIAGNOSIS — R188 Other ascites: Secondary | ICD-10-CM | POA: Diagnosis not present

## 2018-07-30 DIAGNOSIS — Z978 Presence of other specified devices: Secondary | ICD-10-CM

## 2018-07-30 DIAGNOSIS — E785 Hyperlipidemia, unspecified: Secondary | ICD-10-CM | POA: Diagnosis present

## 2018-07-30 DIAGNOSIS — I9589 Other hypotension: Secondary | ICD-10-CM | POA: Diagnosis not present

## 2018-07-30 DIAGNOSIS — R740 Nonspecific elevation of levels of transaminase and lactic acid dehydrogenase [LDH]: Secondary | ICD-10-CM | POA: Diagnosis not present

## 2018-07-30 DIAGNOSIS — J90 Pleural effusion, not elsewhere classified: Secondary | ICD-10-CM | POA: Diagnosis not present

## 2018-07-30 DIAGNOSIS — N5089 Other specified disorders of the male genital organs: Secondary | ICD-10-CM | POA: Diagnosis present

## 2018-07-30 DIAGNOSIS — D6489 Other specified anemias: Secondary | ICD-10-CM | POA: Diagnosis present

## 2018-07-30 DIAGNOSIS — E871 Hypo-osmolality and hyponatremia: Secondary | ICD-10-CM | POA: Diagnosis present

## 2018-07-30 DIAGNOSIS — J9601 Acute respiratory failure with hypoxia: Secondary | ICD-10-CM | POA: Diagnosis not present

## 2018-07-30 DIAGNOSIS — K828 Other specified diseases of gallbladder: Secondary | ICD-10-CM | POA: Diagnosis present

## 2018-07-30 DIAGNOSIS — D649 Anemia, unspecified: Secondary | ICD-10-CM | POA: Diagnosis not present

## 2018-07-30 DIAGNOSIS — J9811 Atelectasis: Secondary | ICD-10-CM | POA: Diagnosis not present

## 2018-07-30 DIAGNOSIS — Z96642 Presence of left artificial hip joint: Secondary | ICD-10-CM | POA: Diagnosis present

## 2018-07-30 DIAGNOSIS — G9340 Encephalopathy, unspecified: Secondary | ICD-10-CM | POA: Diagnosis not present

## 2018-07-30 DIAGNOSIS — Z8781 Personal history of (healed) traumatic fracture: Secondary | ICD-10-CM

## 2018-07-30 DIAGNOSIS — J441 Chronic obstructive pulmonary disease with (acute) exacerbation: Secondary | ICD-10-CM | POA: Diagnosis not present

## 2018-07-30 DIAGNOSIS — Z7189 Other specified counseling: Secondary | ICD-10-CM | POA: Diagnosis not present

## 2018-07-30 DIAGNOSIS — Z4682 Encounter for fitting and adjustment of non-vascular catheter: Secondary | ICD-10-CM | POA: Diagnosis not present

## 2018-07-30 DIAGNOSIS — K5900 Constipation, unspecified: Secondary | ICD-10-CM | POA: Diagnosis present

## 2018-07-30 DIAGNOSIS — R945 Abnormal results of liver function studies: Secondary | ICD-10-CM | POA: Diagnosis not present

## 2018-07-30 DIAGNOSIS — R6510 Systemic inflammatory response syndrome (SIRS) of non-infectious origin without acute organ dysfunction: Secondary | ICD-10-CM | POA: Diagnosis not present

## 2018-07-30 DIAGNOSIS — Z7401 Bed confinement status: Secondary | ICD-10-CM

## 2018-07-30 DIAGNOSIS — Z8619 Personal history of other infectious and parasitic diseases: Secondary | ICD-10-CM

## 2018-07-30 DIAGNOSIS — R6520 Severe sepsis without septic shock: Secondary | ICD-10-CM | POA: Diagnosis present

## 2018-07-30 DIAGNOSIS — N201 Calculus of ureter: Secondary | ICD-10-CM | POA: Diagnosis not present

## 2018-07-30 DIAGNOSIS — R627 Adult failure to thrive: Secondary | ICD-10-CM | POA: Diagnosis present

## 2018-07-30 DIAGNOSIS — Z452 Encounter for adjustment and management of vascular access device: Secondary | ICD-10-CM | POA: Diagnosis not present

## 2018-07-30 DIAGNOSIS — C8510 Unspecified B-cell lymphoma, unspecified site: Secondary | ICD-10-CM | POA: Diagnosis not present

## 2018-07-30 DIAGNOSIS — M6281 Muscle weakness (generalized): Secondary | ICD-10-CM | POA: Diagnosis not present

## 2018-07-30 DIAGNOSIS — I517 Cardiomegaly: Secondary | ICD-10-CM | POA: Diagnosis not present

## 2018-07-30 HISTORY — DX: Unspecified hearing loss, unspecified ear: H91.90

## 2018-07-30 LAB — COMPREHENSIVE METABOLIC PANEL
ALT: 37 U/L (ref 0–44)
AST: 111 U/L — ABNORMAL HIGH (ref 15–41)
Albumin: 1.7 g/dL — ABNORMAL LOW (ref 3.5–5.0)
Alkaline Phosphatase: 239 U/L — ABNORMAL HIGH (ref 38–126)
Anion gap: 7 (ref 5–15)
BUN: 22 mg/dL (ref 8–23)
CO2: 22 mmol/L (ref 22–32)
Calcium: 8 mg/dL — ABNORMAL LOW (ref 8.9–10.3)
Chloride: 98 mmol/L (ref 98–111)
Creatinine, Ser: 0.78 mg/dL (ref 0.61–1.24)
GFR calc Af Amer: >60 mL/min (ref >60)
GFR calc non Af Amer: >60 mL/min (ref >60)
Glucose, Bld: 102 mg/dL — ABNORMAL HIGH (ref 70–99)
Potassium: 4 mmol/L (ref 3.5–5.1)
Sodium: 127 mmol/L — ABNORMAL LOW (ref 135–145)
Total Bilirubin: 1.9 mg/dL — ABNORMAL HIGH (ref 0.3–1.2)
Total Protein: 5.4 g/dL — ABNORMAL LOW (ref 6.5–8.1)

## 2018-07-30 LAB — CBC
HCT: 33.6 % — ABNORMAL LOW (ref 39.0–52.0)
Hemoglobin: 10.6 g/dL — ABNORMAL LOW (ref 13.0–17.0)
MCH: 26.6 pg (ref 26.0–34.0)
MCHC: 31.5 g/dL (ref 30.0–36.0)
MCV: 84.4 fL (ref 80.0–100.0)
Platelets: 45 10*3/uL — ABNORMAL LOW (ref 150–400)
RBC: 3.98 MIL/uL — ABNORMAL LOW (ref 4.22–5.81)
RDW: 15.9 % — ABNORMAL HIGH (ref 11.5–15.5)
WBC: 12 10*3/uL — ABNORMAL HIGH (ref 4.0–10.5)
nRBC: 1.8 % — ABNORMAL HIGH (ref 0.0–0.2)

## 2018-07-30 LAB — LACTIC ACID, PLASMA
Lactic Acid, Venous: 2.8 mmol/L (ref 0.5–1.9)
Lactic Acid, Venous: 2.2 mmol/L (ref 0.5–1.9)

## 2018-07-30 LAB — URINALYSIS, ROUTINE W REFLEX MICROSCOPIC
Bilirubin Urine: NEGATIVE
Glucose, UA: NEGATIVE mg/dL
Ketones, ur: NEGATIVE mg/dL
Leukocytes,Ua: NEGATIVE
Nitrite: NEGATIVE
Protein, ur: 100 mg/dL — AB
Specific Gravity, Urine: 1.024 (ref 1.005–1.030)
pH: 5 (ref 5.0–8.0)

## 2018-07-30 LAB — BLOOD GAS, VENOUS
Acid-base deficit: 1.4 mmol/L (ref 0.0–2.0)
Bicarbonate: 21.9 mmol/L (ref 20.0–28.0)
FIO2: 21
O2 Saturation: 92.8 %
Patient temperature: 98.6
pCO2, Ven: 33.4 mmHg — ABNORMAL LOW (ref 44.0–60.0)
pH, Ven: 7.432 — ABNORMAL HIGH (ref 7.250–7.430)
pO2, Ven: 71.1 mmHg — ABNORMAL HIGH (ref 32.0–45.0)

## 2018-07-30 LAB — PROTIME-INR
INR: 1.5 — ABNORMAL HIGH (ref 0.8–1.2)
Prothrombin Time: 17.9 seconds — ABNORMAL HIGH (ref 11.4–15.2)

## 2018-07-30 LAB — CBG MONITORING, ED: Glucose-Capillary: 103 mg/dL — ABNORMAL HIGH (ref 70–99)

## 2018-07-30 LAB — SODIUM, URINE, RANDOM: Sodium, Ur: <10 mmol/L

## 2018-07-30 LAB — OSMOLALITY, URINE: Osmolality, Ur: 673 mOsm/kg (ref 300–900)

## 2018-07-30 LAB — HEPARIN INDUCED PLATELET AB (HIT ANTIBODY): Heparin Induced Plt Ab: 0.086 OD (ref 0.000–0.400)

## 2018-07-30 MED ORDER — ENOXAPARIN SODIUM 80 MG/0.8ML ~~LOC~~ SOLN
80.0000 mg | Freq: Two times a day (BID) | SUBCUTANEOUS | Status: DC
  Administered 2018-07-30 – 2018-08-04 (×10): 80 mg via SUBCUTANEOUS
  Filled 2018-07-30 (×11): qty 0.8

## 2018-07-30 MED ORDER — BUPROPION HCL ER (XL) 150 MG PO TB24
300.0000 mg | ORAL_TABLET | Freq: Every day | ORAL | Status: DC
  Administered 2018-07-31 – 2018-08-01 (×2): 300 mg via ORAL
  Filled 2018-07-30 (×3): qty 2

## 2018-07-30 MED ORDER — ACETAMINOPHEN 325 MG PO TABS: 650.0000 mg | ORAL_TABLET | Freq: Four times a day (QID) | ORAL | Status: DC | PRN

## 2018-07-30 MED ORDER — ACETAMINOPHEN 650 MG RE SUPP: 650.0000 mg | Freq: Four times a day (QID) | RECTAL | Status: DC | PRN

## 2018-07-30 MED ORDER — ONDANSETRON HCL 4 MG PO TABS: 4.0000 mg | ORAL_TABLET | Freq: Four times a day (QID) | ORAL | Status: DC | PRN

## 2018-07-30 MED ORDER — LACTULOSE 10 GM/15ML PO SOLN
10.0000 g | Freq: Three times a day (TID) | ORAL | Status: DC
  Administered 2018-07-30 – 2018-08-01 (×7): 10 g via ORAL
  Filled 2018-07-30 (×8): qty 15

## 2018-07-30 MED ORDER — SODIUM CHLORIDE 0.9% FLUSH
3.0000 mL | Freq: Once | INTRAVENOUS | Status: AC
  Administered 2018-07-30: 3 mL via INTRAVENOUS

## 2018-07-30 MED ORDER — SENNOSIDES-DOCUSATE SODIUM 8.6-50 MG PO TABS
1.0000 | ORAL_TABLET | Freq: Two times a day (BID) | ORAL | Status: DC
  Administered 2018-07-30 – 2018-08-01 (×4): 1 via ORAL
  Filled 2018-07-30 (×4): qty 1

## 2018-07-30 MED ORDER — IOHEXOL 300 MG/ML  SOLN
100.0000 mL | Freq: Once | INTRAMUSCULAR | Status: AC | PRN
  Administered 2018-07-30: 100 mL via INTRAVENOUS

## 2018-07-30 MED ORDER — SODIUM CHLORIDE (PF) 0.9 % IJ SOLN
INTRAMUSCULAR | Status: AC
  Filled 2018-07-30: qty 50

## 2018-07-30 MED ORDER — SODIUM CHLORIDE 0.9 % IV BOLUS
1000.0000 mL | Freq: Once | INTRAVENOUS | Status: AC
  Administered 2018-07-30: 1000 mL via INTRAVENOUS

## 2018-07-30 MED ORDER — RIFAXIMIN 550 MG PO TABS
550.0000 mg | ORAL_TABLET | Freq: Two times a day (BID) | ORAL | Status: DC
  Administered 2018-07-30 – 2018-07-31 (×2): 550 mg via ORAL
  Filled 2018-07-30 (×2): qty 1

## 2018-07-30 MED ORDER — ONDANSETRON HCL 4 MG/2ML IJ SOLN: 4.0000 mg | Freq: Four times a day (QID) | INTRAMUSCULAR | Status: DC | PRN

## 2018-07-30 NOTE — ED Provider Notes (Signed)
[facility] DEPT Provider Note   CSN: [id number] Arrival date & time: 07/23/2018  1628     History   Chief Complaint Chief Complaint  Patient presents with   Fatigue   Altered Mental Status    HPI Luis Cisneros is a 78 y.o. male.  He was recently admitted and diagnosed with a PE and there was some concern that he was Xarelto failure.  He has been short of breath and intermittently febrile and generally weak with some intermittent confusion.  He was just discharged yesterday afternoon and needed some assistance to get into the car.  Per the wife who is here now giving the history she could barely get him out of the car and he has been in the better chair since then.  He has been weak and he has episodes of lethargy and confusion usually in the evenings.  The wife reached out to the primary care doctor and ultimately the recommendation was to bring her back to the emergency department for admission.  Patient himself is a rather poor historian due to some lethargy and some hard of hearing.  He denies any chest pain or shortness of breath or abdominal pain.  He said he is been drinking okay but has not had any appetite.      The history is provided by the patient and the spouse.  Altered Mental Status Presenting symptoms: lethargy   Severity:  Moderate Most recent episode:  Today Episode history:  Multiple Timing:  Intermittent Progression:  Waxing and waning Chronicity:  New Context: recent illness   Associated symptoms: fever and weakness   Associated symptoms: no abdominal pain, no nausea, no rash, no seizures, no slurred speech and no vomiting     Past Medical History:  Diagnosis Date   Ankle fracture 2006   right   Arthritis    Depression    Hyperlipidemia    Hypertension    Pulmonary embolism ([place])    Vitamin D deficiency     Patient Active Problem List   Diagnosis Date Noted   Generalized weakness 07/27/2018   Fever  07/27/2018   Hematuria 07/27/2018   Transaminitis 07/19/2018   Leg DVT (deep venous thromboembolism), acute, bilateral ([place]) 07/19/2018   PE (pulmonary thromboembolism) ([place]) 07/18/2018   HTN (hypertension) 07/18/2018   Depression 07/18/2018   Ureteral calculus, left 07/18/2018   Hydronephrosis of left kidney 07/18/2018   Acute pulmonary embolism ([place])    Left ureteral stone    Prepatellar bursitis 05/24/2016   Left shoulder pain 01/11/2016   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   Arthritis, hip 04/28/2014   Erectile dysfunction 11/04/2011   History of pulmonary embolism 11/04/2011   Family history of aortic aneurysm 11/04/2011   Hyperlipidemia 11/01/2010   Seasonal affective disorder ([place]) 11/01/2010   ROTATOR CUFF TEAR 01/11/2009   SHOULDER PAIN, RIGHT 12/14/2008   ROTATOR CUFF SYNDROME, RIGHT 12/14/2008   KNEE PAIN, RIGHT 04/30/2007   OSTEOARTHROSIS, LOCAL NOS, PELVIS/THIGH 07/10/2006    Past Surgical History:  Procedure Laterality Date   TONSILLECTOMY     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   dr Mayer Camel   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   Procedure: TOTAL HIP ARTHROPLASTY;  Surgeon: Frederik Pear, MD;  Location: [facility];  Service: Orthopedics;  Laterality: Left;        Home Medications    Prior to Admission medications   Medication Sig Start Date End Date Taking? Authorizing Provider  buPROPion (WELLBUTRIN XL)  300 MG 24 hr tablet TAKE 1 TABLET BY MOUTH EVERY DAY 07/28/18   Elby Showers, MD  camphor-menthol [facility]) lotion Apply 1 application topically 2 (two) times a day.    [provider]  Cholecalciferol (VITAMIN D3) 250 MCG (10000 UT) capsule Take 10,000 Units by mouth daily.    [provider]  feeding supplement, ENSURE ENLIVE, (ENSURE ENLIVE) LIQD Take 237 mLs by mouth daily. 07/29/18   Raiford Noble Latif, DO  Multiple Vitamin (MULTIVITAMIN) tablet Take 1 tablet by mouth daily.      [provider]  Rivaroxaban 15 & 20 MG TBPK Take as directed on package: Start with one 15mg  tablet by mouth twice a day with food. On Day 22, switch to one 20mg  tablet once a day with food. Patient taking differently: Take 15-20 mg by mouth See admin instructions. Take as directed on package: Start with 15 mg by mouth twice daily with food. On Day 22, switch to 20 mg daily with food. 07/22/18   Eugenie Filler, MD  senna (SENOKOT) 8.6 MG TABS tablet Take 1 tablet (8.6 mg total) by mouth at bedtime. Patient taking differently: Take 1 tablet by mouth daily.  07/22/18   Eugenie Filler, MD  traMADol (ULTRAM) 50 MG tablet Take 1 tablet (50 mg total) by mouth every 6 (six) hours as needed. Patient taking differently: Take 50 mg by mouth every 6 (six) hours as needed for moderate pain.  12/19/17   Stefanie Libel, MD    Family History Family History  Problem Relation Age of Onset   Aortic aneurysm Mother    Heart disease Father     Social History Social History   Tobacco Use   Smoking status: Former Smoker    Packs/day: 1.00    Types: Cigarettes    Quit date: 02/06/1987    Years since quitting: 31.4   Smokeless tobacco: Never Used  Substance Use Topics   Alcohol use: Yes    Comment: 1 drink  4-5 days a week   Drug use: No     Allergies   Cephalexin and Feldene [piroxicam]   Review of Systems Review of Systems  Constitutional: Positive for activity change, appetite change and fever.  HENT: Negative for sore throat.   Eyes: Negative for visual disturbance.  Respiratory: Positive for shortness of breath.   Cardiovascular: Negative for chest pain.  Gastrointestinal: Negative for abdominal pain, nausea and vomiting.  Genitourinary: Negative for dysuria.  Musculoskeletal: Positive for gait problem.  Skin: Negative for rash.  Neurological: Positive for weakness. Negative for seizures.     Physical Exam Updated Vital Signs BP 116/70    Pulse (!) 117    Temp 99.9 F (37.7  C) (Oral)    Resp (!) 23    Ht 5\' 10"  (1.778 m)    Wt 82.1 kg    SpO2 93%    BMI 25.97 kg/m   Physical Exam Vitals signs and nursing note reviewed.  Constitutional:      General: He is not in acute distress.    Appearance: He is well-developed.  HENT:     Head: Normocephalic and atraumatic.  Eyes:     Conjunctiva/sclera: Conjunctivae normal.  Neck:     Musculoskeletal: Neck supple.  Cardiovascular:     Rate and Rhythm: Regular rhythm. Tachycardia present.     Heart sounds: No murmur.  Pulmonary:     Effort: Pulmonary effort is normal. No respiratory distress.     Breath  sounds: Rhonchi present.  Abdominal:     Palpations: Abdomen is soft.     Tenderness: There is no abdominal tenderness. There is no guarding.  Musculoskeletal: Normal range of motion.     Right lower leg: No edema.     Left lower leg: No edema.  Skin:    General: Skin is warm and dry.     Capillary Refill: Capillary refill takes less than 2 seconds.  Neurological:     General: No focal deficit present.     Mental Status: He is easily aroused. He is disoriented.     Comments: Patient was oriented to place but could not identify the month.  He is moving all extremities non-focally but is difficult to get him to perform a full motor exam      ED Treatments / Results  Labs (all labs ordered are listed, but only abnormal results are displayed) Labs Reviewed  COMPREHENSIVE METABOLIC PANEL - Abnormal; Notable for the following components:      Result Value   Sodium 127 (*)    Glucose, Bld 102 (*)    Calcium 8.0 (*)    Total Protein 5.4 (*)    Albumin 1.7 (*)    AST 111 (*)    Alkaline Phosphatase 239 (*)    Total Bilirubin 1.9 (*)    All other components within normal limits  CBC - Abnormal; Notable for the following components:   WBC 12.0 (*)    RBC 3.98 (*)    Hemoglobin 10.6 (*)    HCT 33.6 (*)    RDW 15.9 (*)    Platelets 45 (*)    nRBC 1.8 (*)    All other components within normal limits    PROTIME-INR - Abnormal; Notable for the following components:   Prothrombin Time 17.9 (*)    INR 1.5 (*)    All other components within normal limits  LACTIC ACID, PLASMA - Abnormal; Notable for the following components:   Lactic Acid, Venous 2.8 (*)    All other components within normal limits  URINALYSIS, ROUTINE W REFLEX MICROSCOPIC - Abnormal; Notable for the following components:   Color, Urine AMBER (*)    Hgb urine dipstick SMALL (*)    Protein, ur 100 (*)    Bacteria, UA RARE (*)    All other components within normal limits  BLOOD GAS, VENOUS - Abnormal; Notable for the following components:   pH, Ven 7.432 (*)    pCO2, Ven 33.4 (*)    pO2, Ven 71.1 (*)    All other components within normal limits  CBG MONITORING, ED - Abnormal; Notable for the following components:   Glucose-Capillary 103 (*)    All other components within normal limits  CULTURE, BLOOD (ROUTINE X 2)  CULTURE, BLOOD (ROUTINE X 2)  NOVEL CORONAVIRUS, NAA (HOSPITAL ORDER, SEND-OUT TO REF LAB)  SODIUM, URINE, RANDOM  LACTIC ACID, PLASMA  COMPREHENSIVE METABOLIC PANEL  CBC  OSMOLALITY, URINE  OSMOLALITY    EKG EKG Interpretation  Date/Time:  Wednesday July 30 2018 17:11:51 EDT Ventricular Rate:  117 PR Interval:    QRS Duration: 96 QT Interval:  312 QTC Calculation: 436 R Axis:   70 Text Interpretation:  Sinus tachycardia Low voltage, precordial leads Borderline T abnormalities, anterior leads similar to prior 6/20 Confirmed by Aletta Edouard [id number]) on 07/07/2018 5:46:03 PM   Radiology Ct Head Wo Contrast  Result Date: 07/26/2018 CLINICAL DATA:  78 year old male with altered mental status. EXAM: CT HEAD WITHOUT  CONTRAST TECHNIQUE: Contiguous axial images were obtained from the base of the skull through the vertex without intravenous contrast. COMPARISON:  None. FINDINGS: Brain: The ventricles and sulci appropriate size for patient's age. Mild periventricular and deep white matter chronic  microvascular ischemic changes noted. There is no acute intracranial hemorrhage. No mass effect or midline shift. No extra-axial fluid collection. Vascular: No hyperdense vessel or unexpected calcification. Skull: Normal. Negative for fracture or focal lesion. Sinuses/Orbits: No acute finding. Other: None IMPRESSION: 1. No acute intracranial hemorrhage. 2. Mild chronic microvascular ischemic changes. Electronically Signed   By: Anner Crete M.D.   On: 07/18/2018 20:30   Ct Abdomen Pelvis W Contrast  Result Date: 08/01/2018 CLINICAL DATA:  Increasing weakness and altered mental status. Recent hospitalization for pulmonary embolism. EXAM: CT ABDOMEN AND PELVIS WITH CONTRAST TECHNIQUE: Multidetector CT imaging of the abdomen and pelvis was performed using the standard protocol following bolus administration of intravenous contrast. CONTRAST:  169mL OMNIPAQUE IOHEXOL 300 MG/ML  SOLN COMPARISON:  Abdominopelvic CT 07/18/2018.  Ultrasound 07/27/2018. FINDINGS: Lower chest: There are small bilateral pleural effusions and mild bibasilar atelectasis. There is a small pericardial effusion. Hepatobiliary: There is a stable cyst in the left hepatic lobe. No suspicious hepatic findings. No evidence of gallstones, gallbladder wall thickening or biliary dilatation. Pancreas: Unremarkable. No pancreatic ductal dilatation or surrounding inflammation. Spleen: Progressive splenomegaly. The spleen measures 16.9 x 11.3 x 17.7 cm (volume = 1770 cm^3) and demonstrates peripheral wedge shaped areas of decreased attenuation suspicious for small splenic infarcts. There is no perisplenic fluid collection. Adrenals/Urinary Tract: The adrenal glands appear stable. 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 There is stable mild fullness of the left adrenal gland. There is a nonobstructing calculus in the upper pole of the left kidney and bilateral renal sinus cyst formation. Proximal left  ureteral calculus measuring 5 mm on image 55/2 is unchanged in position. There is no significant hydronephrosis or delay in contrast excretion. Mild bladder wall thickening, likely due to incomplete distension. Stomach/Bowel: No evidence of bowel wall thickening, distention or surrounding inflammatory change. The appendix is not definitely visualized. There are mild diverticular changes of the distal colon. Vascular/Lymphatic: Mildly prominent lymph nodes within the gastrohepatic ligament and porta hepatis are stable. There is no retroperitoneal adenopathy. There is diffuse aortic and branch vessel atherosclerosis without acute vascular findings. Reproductive: Stable mild enlargement of the prostate gland with dystrophic calcifications. Other: There is diffuse edema throughout the subcutaneous and intra-abdominal fat, including the perirectal fat. There is a small amount of pelvic ascites. No focal extraluminal fluid collection or pneumoperitoneum. Musculoskeletal: No acute or significant osseous findings. There are diffuse degenerative changes throughout the lumbar spine. Patient is status post left total hip arthroplasty. Moderate right hip degenerative changes are present. IMPRESSION: 1. Progressive splenomegaly with wedge-shaped areas of decreased attenuation peripherally suspicious for subacute splenic infarcts. 2. Unchanged position of proximal left ureteral calculus compared with CT of 12 days ago. No significant hydronephrosis or delay in contrast excretion. 3. Interval development of diffuse soft tissue edema, ascites and bilateral pleural effusions consistent with anasarca. 4.  Aortic Atherosclerosis (ICD10-I70.0). Electronically Signed   By: Richardean Sale M.D.   On: 08/04/2018 21:01   Dg Chest Port 1 View  Result Date: 07/13/2018 CLINICAL DATA:  Altered mental status EXAM: PORTABLE CHEST 1 VIEW COMPARISON:  Chest radiograph and chest CT July 27, 2018 FINDINGS: There is mild atelectasis in the left  base with minimal left  pleural effusion. Lungs elsewhere clear. Heart is upper normal in size with pulmonary vascularity normal. No adenopathy. No bone lesions. IMPRESSION: Mild left base atelectasis with minimal left pleural effusion. Lungs elsewhere clear. Stable cardiac silhouette. Electronically Signed   By: Lowella Grip III M.D.   On: 07/21/2018 19:00    Procedures Procedures (including critical care time)  Medications Ordered in ED Medications  sodium chloride flush (NS) 0.9 % injection 3 mL (has no administration in time range)     Initial Impression / Assessment and Plan / ED Course  I have reviewed the triage vital signs and the nursing notes.  Pertinent labs & imaging results that were available during my care of the patient were reviewed by me and considered in my medical decision making (see chart for details).  Clinical Course as of Jul 29 2145  Wed Jul 30, 2018  1755 Reticulocyte echo 6/20 showed an EF of 55 to 60%.   [MB]  2844 78 year old male who just got discharged yesterday here with waxing and waning mental status fatigue lethargy low-grade fevers.  He is tachycardic here and mildly tachypneic and is on oxygen.  Temp is 99.9.  Differential includes sepsis, Sirs, deconditioning, ACS,   [MB]  1756 Patient's lactate is elevated at 2.8.  We will start him on some IV fluids.  Blood cultures done and urinalysis is pending.  He is in for a chest x-ray.   [MB]  7494 Discussed with Dr. Rens Pronto from the hospitalist service who asked if we can get the patient a head CT because of this confusion lethargy and his low platelets possibly he had a spontaneous bleed.  He will evaluate the patient for admission.   [MB]    Clinical Course User Index [MB] Hayden Rasmussen, MD   Luis Cisneros was evaluated in Emergency Department on 07/25/2018 for the symptoms described in the history of present illness. He was evaluated in the context of the global COVID-19 pandemic, which  necessitated consideration that the patient might be at risk for infection with the SARS-CoV-2 virus that causes COVID-19. Institutional protocols and algorithms that pertain to the evaluation of patients at risk for COVID-19 are in a state of rapid change based on information released by regulatory bodies including the CDC and federal and state organizations. These policies and algorithms were followed during the patient's care in the ED.      Final Clinical Impressions(s) / ED Diagnoses   Final diagnoses:  SIRS (systemic inflammatory response syndrome) ([place])  Somnolence  Weakness    ED Discharge Orders    None       Hayden Rasmussen, MD 07/31/18 1308

## 2018-07-30 NOTE — ED Notes (Signed)
Date and time results received: 07/15/2018 1750 (use smartphrase ".now" to insert current time)  Test: Lactic acid Critical Value: 2.8  Name of Provider Notified: Dr. Melina Copa  Orders Received? Or Actions Taken?:

## 2018-07-30 NOTE — ED Notes (Signed)
Bed: [facility]38 Expected date:  Expected time:  Means of arrival:  Comments: EMS/weak

## 2018-07-30 NOTE — Progress Notes (Signed)
ANTICOAGULATION CONSULT NOTE - Initial Consult  Pharmacy Consult for Lovenox Indication: pulmonary embolus  Allergies  Allergen Reactions  . Cephalexin Nausea Only  . Feldene [Piroxicam] Hives, Nausea Only and Other (See Comments)    Makes pt feel loopy    Patient Measurements: Height: 5\' 10"  (177.8 cm) Weight: 181 lb (82.1 kg) IBW/kg (Calculated) : 73   Vital Signs: Temp: 99.9 F (37.7 C) (06/24 1644) Temp Source: Oral (06/24 1644) BP: 106/70 (06/24 2230) Pulse Rate: 107 (06/24 2230)  Labs: Recent Labs    07/28/18 0508 07/28/18 1608 07/29/18 0352 07/29/18 1254 07/29/18 1502 07/28/2018 1709  HGB 10.5*  --  10.5*  --  10.7* 10.6*  HCT 34.1*  --  34.3*  --  34.3* 33.6*  PLT 101*  --  70*  --  62* 45*  APTT 143* 197* 92*  --   --   --   LABPROT  --   --   --   --   --  17.9*  INR  --   --   --   --   --  1.5*  HEPARINUNFRC 0.87* 0.80* 0.40 0.42  --   --   CREATININE 0.65  --  0.66  --   --  0.78    Estimated Creatinine Clearance: 78.6 mL/min (by C-G formula based on SCr of 0.78 mg/dL).   Medical History: Past Medical History:  Diagnosis Date  . Ankle fracture 2006   right  . Arthritis   . Depression   . Hyperlipidemia   . Hypertension   . Pulmonary embolism ([place])   . Vitamin D deficiency     Medications:  Scheduled:  . [START ON 07/31/2018] buPROPion  300 mg Oral Daily  . lactulose  10 g Oral TID  . rifaximin  550 mg Oral BID  . senna-docusate  1 tablet Oral BID  . sodium chloride (PF)       Infusions:    Assessment: 62 yoM [facility] now with weakness and lethargy. On PTA xarelto LD 6/24 0830. (on 15 mg bid)  Today: H/H=10.6/33.6, plts = 45  Goal of Therapy:  Anti-Xa level 0.6-1 units/ml 4hrs after LMWH dose given    Plan:  Lovenox 80 mg sq q12h F/u scr  Lawana Pai R 07/09/2018,10:40 PM

## 2018-07-30 NOTE — H&P (Signed)
History and Physical    ANASTACIO BUA EHO:[id number] DOB: 07/19/1940 DOA: 07/29/2018  PCP: Elby Showers, MD  Patient coming from: Home  I have personally briefly reviewed patient's old medical records in [place]  Chief Complaint: Generalized weakness, confusion, fall  HPI: Luis Cisneros is a 78 y.o. male with medical history significant for recently diagnosed pulmonary embolus and bilateral lower extremity DVTs on Xarelto anticoagulation, hypertension, hyperlipidemia, depression, and hard of hearing who presents the ED for evaluation of generalized weakness, confusion, abdominal distention.  History is limited from patient therefore majority of history is obtained from wife at bedside, EDP, and chart review.  Patient was recently admitted from 07/18/2018-07/22/2018 for a left segmental pulmonary embolus and bilateral lower extremity DVTs.  Patient was treated with IV heparin for 72 hours and transition to oral Xarelto on discharge.  He was discharged on home supplemental oxygen due to persistent hypoxia while admitted.  2D echo 07/19/2018 was negative for right ventricular strain was 55-60%.    Hypercoagulable work-up obtained 07/18/2018 was notable for decreased protein S activity of 37% with total protein S antigen within normal limits at 143%.  Lupus anticoagulant DRVVT was 55.2 and corrected on mixing study.  Total protein C was decreased at 53% with protein C activity within normal limits at 78%.  Patient was readmitted from 07/27/2018-07/29/2018 for generalized weakness/confusion and lethargy.  He was treated with supportive care.  Repeat CTA chest showed persistent known PE.  He was also noted to have worsening thrombocytopenia concerning for HIT.  HIT antibody was obtained and has resulted negative today 08/05/2018.  He has been on continued Xarelto anticoagulation.    He was also noted to have continued abnormal LFTs of unknown etiology.  Previous hepatitis B panel was negative and  acute hepatitis panel was negative.  RUQ ultrasound showed gallbladder sludge without sonographic evidence for acute cholecystitis but did show echogenic liver suggesting steatosis and/or hepatocellular disease.  Left hepatic lobe cyst was noted.  Follow-up ultrasound liver Doppler showed normal hepatic venous Dopplers.  PT/OT were consulted and recommended home health upon discharge.  Patient is brought back to the ED today, 07/20/2018, at the recommendation of his PCP after discussion with patient's wife.  Wife states that patient has continued weakness and lethargy and fell while trying to get out of the car at home yesterday.  She also states that patient has had increased swelling in his abdomen and legs.  She states that he has not had a bowel movement in 1 week.  She also has noted that he has been having difficulty with initiating urination but denies burning on urination.  He has continued intermittent confusion and poor appetite.  She reports intermittent fevers at home.  She reports seeing spotting of blood on toilet paper once without otherwise overt bleeding including hemoptysis, hematemesis, epistaxis, or melena.   ED Course:  Initial vitals showed BP 118/72, pulse 118, RR 22, temp 99.9 Fahrenheit, SPO2 96% on 2 L supplemental O2 via [place].  Labs were notable for WBC 12.0, hemoglobin 10.6, platelets 45,000 (62,000 on 07/29/2018), sodium 127, potassium 4.0, bicarb 22, BUN 22, creatinine 0.78, AST 111, ALT 37, alk phos 239, total bilirubin 1.9, albumin 1.7, lactic acid 2.8.  VBG showed pH 7.432, PCO2 33.4, PO2 71.1 on 21% FiO2.  Urinalysis was negative for nitrites, negative for leukocytes, showed 11-20 RBC/hpf, 0-5 WBC/HPF, rare bacteria on microscopy.  Blood cultures were obtained and pending.  Portable chest x-ray showed a small  left pleural effusion and mild left base atelectasis without focal consolidation or pulmonary edema.  Patient was given 1 L of normal saline the hospitalist service  was consulted to admit for further evaluation and management.  CT head without contrast was requested and obtained and was negative for acute intracranial hemorrhage, mild chronic microvascular ischemic changes were seen.   Review of Systems:  Unable to obtain full review of systems due to encephalopathy.   Past Medical History:  Diagnosis Date  . Ankle fracture 2006   right  . Arthritis   . Depression   . Hyperlipidemia   . Hypertension   . Pulmonary embolism ([place])   . Vitamin D deficiency     Past Surgical History:  Procedure Laterality Date  . TONSILLECTOMY    . 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 His smoking use included cigarettes. He smoked 1.00 pack per day. He has never used smokeless tobacco. He reports current alcohol use. He reports that he does not use drugs.  Allergies  Allergen Reactions  . Cephalexin Nausea Only  . Feldene [Piroxicam] Hives, Nausea Only and Other (See Comments)    Makes pt feel loopy    Family History  Problem Relation Age of Onset  . Aortic aneurysm Mother   . Heart disease Father      Prior to Admission medications   Medication Sig Start Date End Date Taking? Authorizing Provider  buPROPion (WELLBUTRIN XL) 300 MG 24 hr tablet TAKE 1 TABLET BY MOUTH EVERY DAY 07/28/18  Yes Baxley, Cresenciano Lick, MD  camphor-menthol [facility]) lotion Apply 1 application topically 2 (two) times a day.   Yes [provider]  Cholecalciferol (VITAMIN D3) 250 MCG (10000 UT) capsule Take 10,000 Units by mouth daily.   Yes [provider]  feeding supplement, ENSURE ENLIVE, (ENSURE ENLIVE) LIQD Take 237 mLs by mouth daily. 07/29/18  Yes Sheikh, Omair Latif, DO  Multiple Vitamin (MULTIVITAMIN) tablet Take 1  tablet by mouth daily.     Yes [provider]  Rivaroxaban 15 & 20 MG TBPK Take as directed on package: Start with one '15mg'$  tablet by mouth twice a day with food. On Day 22, switch to one '20mg'$  tablet once a day with food. Patient taking differently: Take 15-20 mg by mouth See admin instructions. Take as directed on package: Start with 15 mg by mouth twice daily with food. On Day 22, switch to 20 mg daily with food. 07/22/18  Yes Eugenie Filler, MD  traMADol (ULTRAM) 50 MG tablet Take 1 tablet (50 mg total) by mouth every 6 (six) hours as needed. Patient taking differently: Take 50 mg by mouth every 6 (six) hours as needed for moderate pain.  12/19/17  Yes Stefanie Libel, MD  senna (SENOKOT) 8.6 MG TABS tablet Take 1 tablet (8.6 mg total) by mouth at bedtime. Patient not taking: Reported on 07/11/2018 07/22/18   Eugenie Filler, MD    Physical Exam: Vitals:   07/29/2018 1830 07/07/2018 1900 07/13/2018 1930 08/05/2018 2000  BP: 126/73 120/68 126/72 119/69  Pulse: (!) 115 (!) 112 (!) 113 (!) 113  Resp: (!) 21 (!) 24 (!) 25 (!) 21  Temp:      TempSrc:  SpO2: 96% 96% 96% 94%  Weight:      Height:        Constitutional: Elderly male resting supine in bed, NAD, calm, lethargic, hard of hearing Eyes: PERRL, EOMI, lids and conjunctivae normal ENMT: Mucous membranes are dry. Posterior pharynx clear of any exudate or lesions. Neck: normal, supple, no masses. Respiratory: Bibasilar inspiratory crackles. Normal respiratory effort. No accessory muscle use.  Cardiovascular: Tachycardic with regular rhythm, no murmurs / rubs / gallops.  Trace bilateral edema bilateral lower extremities. Abdomen: Bowel sounds diminished, generalized tenderness to palpation.  Musculoskeletal: no clubbing / cyanosis. No joint deformity upper and lower extremities. Good ROM, no contractures. Normal muscle tone.  Skin: no rashes, lesions, ulcers. No induration Neurologic: CN 2-12 grossly intact. Sensation intact,  Strength 5/5 in all 4.  Psychiatric: Lethargic but awakens and arouses easily to voice when speaking loudly.  He is oriented to self, can name his wife at bedside, states he is in a residential building however knows that he is in [place], knows the year is 2020   Labs on Admission: I have personally reviewed following labs and imaging studies  CBC: Recent Labs  Lab 07/27/18 1146 07/28/18 0508 07/29/18 0352 07/29/18 1502 07/17/2018 1709  WBC 4.6 5.4 6.5 8.5 12.0*  NEUTROABS 2.7  --  2.3 2.7  --   HGB 11.9* 10.5* 10.5* 10.7* 10.6*  HCT 38.2* 34.1* 34.3* 34.3* 33.6*  MCV 85.7 87.0 86.6 85.8 84.4  PLT 112* 101* 70* 62* 45*   Basic Metabolic Panel: Recent Labs  Lab 07/27/18 1146 07/28/18 0508 07/29/18 0352 07/22/2018 1709  NA 131* 131* 132* 127*  K 3.9 3.9 3.9 4.0  CL 95* 99 103 98  CO2 '25 22 22 22  '$ GLUCOSE 85 72 75 102*  BUN '14 11 13 22  '$ CREATININE 0.77 0.65 0.66 0.78  CALCIUM 9.1 8.2* 8.0* 8.0*  MG 2.0  --  1.9  --   PHOS  --   --  2.0*  --    GFR: Estimated Creatinine Clearance: 78.6 mL/min (by C-G formula based on SCr of 0.78 mg/dL). Liver Function Tests: Recent Labs  Lab 07/27/18 1146 07/28/18 0508 07/29/18 0352 07/24/2018 1709  AST 102* 97* 108* 111*  ALT 56* 47* 43 37  ALKPHOS 321* 286* 264* 239*  BILITOT 1.9* 2.1* 2.3* 1.9*  PROT 6.5 5.7* 5.6* 5.4*  ALBUMIN 2.2* 1.9* 1.9* 1.7*   No results for input(s): LIPASE, AMYLASE in the last 168 hours. Recent Labs  Lab 07/27/18 1528  AMMONIA 35   Coagulation Profile: Recent Labs  Lab 07/27/18 1529 07/13/2018 1709  INR 1.7* 1.5*   Cardiac Enzymes: No results for input(s): CKTOTAL, CKMB, CKMBINDEX, TROPONINI in the last 168 hours. BNP (last 3 results) No results for input(s): PROBNP in the last 8760 hours. HbA1C: No results for input(s): HGBA1C in the last 72 hours. CBG: Recent Labs  Lab 07/28/18 0635 07/18/2018 1730  GLUCAP 90 103*   Lipid Profile: No results for input(s): CHOL, HDL, LDLCALC, TRIG,  CHOLHDL, LDLDIRECT in the last 72 hours. Thyroid Function Tests: No results for input(s): TSH, T4TOTAL, FREET4, T3FREE, THYROIDAB in the last 72 hours. Anemia Panel: No results for input(s): VITAMINB12, FOLATE, FERRITIN, TIBC, IRON, RETICCTPCT in the last 72 hours. Urine analysis:    Component Value Date/Time   COLORURINE AMBER (A) 07/09/2018 1709   APPEARANCEUR CLEAR 07/08/2018 1709   LABSPEC 1.024 07/11/2018 1709   PHURINE 5.0 07/10/2018 1709   GLUCOSEU NEGATIVE 07/09/2018 1709  HGBUR SMALL (A) 08/02/2018 1709   BILIRUBINUR NEGATIVE 07/11/2018 1709   BILIRUBINUR moderate (A) 07/25/2018 1645   BILIRUBINUR NEG 11/12/2017 1004   KETONESUR NEGATIVE 07/17/2018 1709   PROTEINUR 100 (A) 07/12/2018 1709   UROBILINOGEN 0.2 07/25/2018 1645   UROBILINOGEN 0.2 04/16/2014 1357   NITRITE NEGATIVE 07/29/2018 1709   LEUKOCYTESUR NEGATIVE 08/02/2018 1709    Radiological Exams on Admission: Dg Chest Port 1 View  Result Date: 08/04/2018 CLINICAL DATA:  Altered mental status EXAM: PORTABLE CHEST 1 VIEW COMPARISON:  Chest radiograph and chest CT July 27, 2018 FINDINGS: There is mild atelectasis in the left base with minimal left pleural effusion. Lungs elsewhere clear. Heart is upper normal in size with pulmonary vascularity normal. No adenopathy. No bone lesions. IMPRESSION: Mild left base atelectasis with minimal left pleural effusion. Lungs elsewhere clear. Stable cardiac silhouette. Electronically Signed   By: Lowella Grip III M.D.   On: 07/24/2018 19:00    EKG: Independently reviewed. Sinus tachycardia, low voltage, rate is faster when compared to prior.  Assessment/Plan Principal Problem:   Generalized weakness Active Problems:   History of pulmonary embolism   Depression   Transaminitis   Constipation   Hyponatremia  DUANE EARNSHAW is a 78 y.o. male with medical history significant for recently diagnosed pulmonary embolus and bilateral lower extremity DVTs on Xarelto  anticoagulation, hypertension, hyperlipidemia, depression, and hard of hearing who was admitted for encephalopathy and generalized deconditioning.  Encephalopathy/Transaminitis: Unclear etiology, however presumed hepatic encephalopathy with unspecified liver dysfunction (steatosis and/or hepatocellular disease on previous ultrasound).  Repeat CT abdomen/pelvis with contrast shows a stable cyst in the left hepatic lobe without otherwise suspicious hepatic or gallbladder findings.  AST, alk phos, total bilirubin remain elevated, however decreased compared to yesterday. 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  There was no evidence of bowel wall thickening, distention, or inflammatory change.  Suspect infarction due to known blood clots. -Start Lovenox anticoagulation  Pulmonary embolism and bilateral lower extremity DVT: Has been on Xarelto anticoagulation.  HIT antibody on last admission was negative.  Discussed with hematology, Dr. Lindi Adie, who will see in a.m. and recommended switching to Lovenox anticoagulation. -Start Lovenox anticoagulation, monitor for signs/symptoms of bleeding and platelets closely -Appreciate hematology follow-up  Thrombocytopenia: Platelets are continuing to trend down to 45,000 from 62,000 one day ago.  HIT antibody was negative therefore unlikely related to heparin.  I discussed with hematology as above who recommended continued anticoagulation as it is felt the thrombocytopenia is likely due to consumptive process versus underlying hepatic dysfunction.  Currently patient and wife deny any obvious/overt bleeding. -Continue to monitor platelets and for  signs/symptoms of bleeding closely while on anticoagulation -No indication for platelet transfusion on admission  Acute on chronic respiratory failure with hypoxia: Likely secondary to known PE. -Continue anticoagulation as above -Continue supplemental oxygen  Hyponatremia: Suspect this is due to anasarca.  He was given 1 L normal saline in the ED.  Will hold further fluids for now and obtain urine sodium/osmolality and serum osmolality.  Recheck labs in a.m.  Constipation: Per wife and patient, has not had a bowel movement in 1 week.  Start lactulose as above, add Senokot.  CT abdomen/pelvis is without evidence of  bowel wall thickening, distention, or inflammatory change  Anasarca/hypoalbuminemia: Suspect related to unspecified liver disorder.  Will consult dietitian.  Generalized weakness and fall at home: Patient continues to have generalized weakness and had reported fall at home yesterday without injury despite wife's assistance.  Suspect he will likely need further care such as skilled nursing facility on discharge pending clinical progress. -PT/OT consults ordered  Gram-positive rods in anaerobic culture drawn 07/28/2018 from right arm: This is most likely a contaminant as second set of cultures drawn from right arm are no growth to date.  There is otherwise no obvious bacterial infection.  Repeat blood cultures were obtained in the ED today (07/20/2018).  Will hold off on antibiotics at this time.  I did briefly discuss with ID by phone who felt this was consistent with contaminant.  Depression: -Resume home bupropion   DVT prophylaxis: Lovenox anticoagulation Code Status: Full code, confirmed with wife at bedside Family Communication: Discussed with wife at bedside Disposition Plan: Pending clinical progress Consults called: Hematology, Dr. Lindi Adie to see tomorrow Admission status: Inpatient, patient likely requires greater than 2 midnight length stay for further work-up and  management of encephalopathy, hepatic dysfunction, pulmonary embolus anticoagulation in the setting of thrombocytopenia, and likely further specialist evaluation as he is high risk for decompensation.   Zada Finders MD [facility]  If 7PM-7AM, please contact night-coverage [email or web address]  08/02/2018, 8:29 PM

## 2018-07-30 NOTE — Progress Notes (Addendum)
CSW was notified by [facility] with patient experience that patient has fallen twice since discharging home yesterday 6/23. Requesting assistance with patient possibly going to rehab.   CSW Reached out to [facility] and made referral, they are setting up an [facility] and Education officer, museum for patient. Patient's niece Mikle Bosworth [phone]) should be notified within the next 3 days of who the [facility] and Social Worker will be. From there they can assess rehab options for patient.   Please call with any questions.   [place], [place]

## 2018-07-30 NOTE — Telephone Encounter (Signed)
TOC CM contacted PCP, Dr Renold Genta to arrange follow up appt. Spoke to Dr Renold Genta and pt is on his way back to ED. MD states pt will benefit for SNF rehab. States pt had falls at home. Waiting on pt's return to ED. Jonnie Finner RN CCM Case Mgmt phone [phone]

## 2018-07-30 NOTE — TOC Progression Note (Signed)
Transition of Care [facility]) - Progression Note    Patient Details  Name: ERIQUE KASER MRN: [id number] Date of Birth: [birth year]  [facility] CM/SW Contact  Katria Botts, Juliann Pulse, RN Phone Number: 07/27/2018, 9:50 AM  Clinical Narrative:post d/c note today:Received call this morning from dtr Mimi that patient was weak, & what should she do. CM explained that since he d/c yesterday we recommend that she contact his pcp with her medical concerns while patient was only having weakness.She had concerns about going to rehab. I also recc that I could contact the [facility] agency [facility] to call her about the home visit. Mimi agreed to the advice, & appreciated my assistance. I did contact [facility] spoke to Clarise Cruz who will call Mimi. No further CM needs post d/c.         Barriers to Discharge: No Barriers Identified  Expected Discharge Plan and Services           Expected Discharge Date: 07/22/18               DME Arranged: Oxygen DME Agency: [employer] Date DME Agency Contacted: 07/22/18 Time DME Agency Contacted: 80 Representative spoke with at DME Agency: zack blank HH Arranged: PT [place]: [facility] Date [place]: 07/22/18 Time [place]: 2423 Representative spoke with at [place]: [place] ([place]) Interventions    Readmission Risk Interventions No flowsheet data found.

## 2018-07-30 NOTE — Telephone Encounter (Signed)
I was contacted urgently by patient's wife. Mimi McGinn today. Apparently, pt fell getting out of car at home yesterday and continued to be weak and lethargic last evening unable to stand on his own and slipped out of reclining chair onto the floor. She stated she was told there would be an aide available to help but they were not scheduled to come out for 3 days. She felt he was not ready for discharge yesterday from [facility] and expressed her concerns to head nurse about that yesterday as well as the hospitalist.  Advised that I was unable to directly admit pt to the hospital. She does not want to have to take him back to the ED and has been displeased with care and evlauation at [facility]. She now wants to take him to [facility] and thinks I can arrange for direct admission and or rehab. Explained to her, I do not having admitting privileges. I think he should go back to hospital if he is not doing well. She would like to have Neuro evaluation also.  Case manager called today from [place] to make follow up appt here  and I told her he was being directed back to hospital. I personally spoke with PT from home health agency, Georgina Snell who was worried about patient's condition and stated he could not cooperate with PT today because of lethargy, decreased mental alertness and generalized weakness as I understood. Wife does not want to take him back to [facility]. Advised calling 911 for transport to [facility].

## 2018-07-30 NOTE — ED Triage Notes (Signed)
Per EMS, Pt is being referred to our ED by PCP (note in chart), pt has had increased weakness, and altered mental status. Normal baseline is A&O 4x. Pt was recently discharged apprx a week ago for a blood clot. Bilateral weakness noted.

## 2018-07-30 NOTE — ED Notes (Signed)
Patient transported to CT 

## 2018-07-30 NOTE — ED Notes (Signed)
Date and time results received: 07/28/2018 2218 (use smartphrase ".now" to insert current time)  Test: Lactic Critical Value: 2.2  Name of Provider Notified: Dr. Creegan Pronto  Orders Received? Or Actions Taken?: Orders Received - See Orders for details

## 2018-07-31 ENCOUNTER — Other Ambulatory Visit: Payer: Self-pay

## 2018-07-31 ENCOUNTER — Other Ambulatory Visit: Payer: Self-pay | Admitting: *Deleted

## 2018-07-31 ENCOUNTER — Ambulatory Visit: Payer: Medicare Other | Admitting: Internal Medicine

## 2018-07-31 DIAGNOSIS — D735 Infarction of spleen: Secondary | ICD-10-CM | POA: Diagnosis present

## 2018-07-31 DIAGNOSIS — F329 Major depressive disorder, single episode, unspecified: Secondary | ICD-10-CM

## 2018-07-31 DIAGNOSIS — R400 Somnolence: Secondary | ICD-10-CM

## 2018-07-31 DIAGNOSIS — R791 Abnormal coagulation profile: Secondary | ICD-10-CM | POA: Diagnosis present

## 2018-07-31 DIAGNOSIS — R601 Generalized edema: Secondary | ICD-10-CM | POA: Diagnosis present

## 2018-07-31 DIAGNOSIS — R748 Abnormal levels of other serum enzymes: Secondary | ICD-10-CM | POA: Diagnosis present

## 2018-07-31 DIAGNOSIS — E871 Hypo-osmolality and hyponatremia: Secondary | ICD-10-CM

## 2018-07-31 DIAGNOSIS — E872 Acidosis, unspecified: Secondary | ICD-10-CM | POA: Diagnosis present

## 2018-07-31 DIAGNOSIS — R162 Hepatomegaly with splenomegaly, not elsewhere classified: Secondary | ICD-10-CM

## 2018-07-31 DIAGNOSIS — E8809 Other disorders of plasma-protein metabolism, not elsewhere classified: Secondary | ICD-10-CM

## 2018-07-31 LAB — CBC WITH DIFFERENTIAL/PLATELET
Abs Immature Granulocytes: 0.28 10*3/uL — ABNORMAL HIGH (ref 0.00–0.07)
Basophils Absolute: 0.1 10*3/uL (ref 0.0–0.1)
Basophils Relative: 1 %
Eosinophils Absolute: 0.1 10*3/uL (ref 0.0–0.5)
Eosinophils Relative: 1 %
HCT: 31.1 % — ABNORMAL LOW (ref 39.0–52.0)
Hemoglobin: 9.8 g/dL — ABNORMAL LOW (ref 13.0–17.0)
Immature Granulocytes: 2 %
Lymphocytes Relative: 33 %
Lymphs Abs: 4.3 10*3/uL — ABNORMAL HIGH (ref 0.7–4.0)
MCH: 27 pg (ref 26.0–34.0)
MCHC: 31.5 g/dL (ref 30.0–36.0)
MCV: 85.7 fL (ref 80.0–100.0)
Monocytes Absolute: 5 10*3/uL — ABNORMAL HIGH (ref 0.1–1.0)
Monocytes Relative: 36 %
Neutro Abs: 3.5 10*3/uL (ref 1.7–7.7)
Neutrophils Relative %: 27 %
Platelets: 44 10*3/uL — ABNORMAL LOW (ref 150–400)
RBC: 3.63 MIL/uL — ABNORMAL LOW (ref 4.22–5.81)
RDW: 16.1 % — ABNORMAL HIGH (ref 11.5–15.5)
WBC: 13.3 10*3/uL — ABNORMAL HIGH (ref 4.0–10.5)
nRBC: 4.4 % — ABNORMAL HIGH (ref 0.0–0.2)

## 2018-07-31 LAB — OSMOLALITY: Osmolality: 272 mOsm/kg — ABNORMAL LOW (ref 275–295)

## 2018-07-31 LAB — COMPREHENSIVE METABOLIC PANEL
ALT: 34 U/L (ref 0–44)
AST: 101 U/L — ABNORMAL HIGH (ref 15–41)
Albumin: 1.7 g/dL — ABNORMAL LOW (ref 3.5–5.0)
Alkaline Phosphatase: 203 U/L — ABNORMAL HIGH (ref 38–126)
Anion gap: 10 (ref 5–15)
BUN: 21 mg/dL (ref 8–23)
CO2: 19 mmol/L — ABNORMAL LOW (ref 22–32)
Calcium: 7.9 mg/dL — ABNORMAL LOW (ref 8.9–10.3)
Chloride: 100 mmol/L (ref 98–111)
Creatinine, Ser: 0.67 mg/dL (ref 0.61–1.24)
GFR calc Af Amer: >60 mL/min (ref >60)
GFR calc non Af Amer: >60 mL/min (ref >60)
Glucose, Bld: 89 mg/dL (ref 70–99)
Potassium: 4 mmol/L (ref 3.5–5.1)
Sodium: 129 mmol/L — ABNORMAL LOW (ref 135–145)
Total Bilirubin: 2.2 mg/dL — ABNORMAL HIGH (ref 0.3–1.2)
Total Protein: 5 g/dL — ABNORMAL LOW (ref 6.5–8.1)

## 2018-07-31 LAB — ABO/RH: ABO/RH(D): A NEG

## 2018-07-31 LAB — CBC
HCT: 33.3 % — ABNORMAL LOW (ref 39.0–52.0)
Hemoglobin: 10 g/dL — ABNORMAL LOW (ref 13.0–17.0)
MCH: 26.2 pg (ref 26.0–34.0)
MCHC: 30 g/dL (ref 30.0–36.0)
MCV: 87.2 fL (ref 80.0–100.0)
Platelets: 38 10*3/uL — ABNORMAL LOW (ref 150–400)
RBC: 3.82 MIL/uL — ABNORMAL LOW (ref 4.22–5.81)
RDW: 16 % — ABNORMAL HIGH (ref 11.5–15.5)
WBC: 12 10*3/uL — ABNORMAL HIGH (ref 4.0–10.5)
nRBC: 2.4 % — ABNORMAL HIGH (ref 0.0–0.2)

## 2018-07-31 LAB — MAGNESIUM: Magnesium: 2.1 mg/dL (ref 1.7–2.4)

## 2018-07-31 LAB — IMMATURE PLATELET FRACTION: Immature Platelet Fraction: 6.9 % (ref 1.2–8.6)

## 2018-07-31 LAB — LACTIC ACID, PLASMA
Lactic Acid, Venous: 2.2 mmol/L (ref 0.5–1.9)
Lactic Acid, Venous: 2.2 mmol/L (ref 0.5–1.9)

## 2018-07-31 LAB — MRSA PCR SCREENING: MRSA by PCR: NEGATIVE

## 2018-07-31 LAB — BRAIN NATRIURETIC PEPTIDE: B Natriuretic Peptide: 33.8 pg/mL (ref 0.0–100.0)

## 2018-07-31 LAB — AMMONIA: Ammonia: 37 umol/L — ABNORMAL HIGH (ref 9–35)

## 2018-07-31 LAB — PHOSPHORUS: Phosphorus: 1.7 mg/dL — ABNORMAL LOW (ref 2.5–4.6)

## 2018-07-31 MED ORDER — ORAL CARE MOUTH RINSE
15.0000 mL | Freq: Two times a day (BID) | OROMUCOSAL | Status: DC
  Administered 2018-07-31 – 2018-08-06 (×14): 15 mL via OROMUCOSAL

## 2018-07-31 MED ORDER — SODIUM PHOSPHATES 45 MMOLE/15ML IV SOLN
30.0000 mmol | Freq: Once | INTRAVENOUS | Status: AC
  Administered 2018-07-31: 30 mmol via INTRAVENOUS
  Filled 2018-07-31: qty 10

## 2018-07-31 MED ORDER — CHLORHEXIDINE GLUCONATE CLOTH 2 % EX PADS
6.0000 | MEDICATED_PAD | Freq: Every day | CUTANEOUS | Status: DC
  Administered 2018-08-01 – 2018-08-06 (×5): 6 via TOPICAL

## 2018-07-31 MED ORDER — SODIUM CHLORIDE 0.9 % IV BOLUS
1000.0000 mL | Freq: Once | INTRAVENOUS | Status: AC
  Administered 2018-07-31: 04:00:00 1000 mL via INTRAVENOUS

## 2018-07-31 MED ORDER — CAMPHOR-MENTHOL 0.5-0.5 % EX LOTN
TOPICAL_LOTION | Freq: Two times a day (BID) | CUTANEOUS | Status: DC
  Administered 2018-07-31 – 2018-08-01 (×4): via TOPICAL
  Administered 2018-08-02: 1 via TOPICAL
  Administered 2018-08-02: 09:00:00 via TOPICAL
  Administered 2018-08-03: 1 via TOPICAL
  Administered 2018-08-03 – 2018-08-06 (×7): via TOPICAL
  Filled 2018-07-31: qty 222

## 2018-07-31 MED ORDER — SODIUM CHLORIDE 0.9% IV SOLUTION
Freq: Once | INTRAVENOUS | Status: AC
  Administered 2018-07-31: 18:00:00 via INTRAVENOUS

## 2018-07-31 NOTE — Evaluation (Signed)
Physical Therapy Evaluation Patient Details Name: Luis Cisneros MRN: [id number] DOB: [birth year] Today's Date: 07/31/2018   History of Present Illness  78 year old with 2 recent admissions, readmitted for generalized weakness, fall,  intermitten confusion and abdominal distention.  Imaging reveals splenomegaly, diffuse anasaraca, and respiratory compromise requiring O2 via [place]. Pt with recent PE, HTN, depression and h/o subacute infarcts  Clinical Impression   Pt presents with LE weakness, impaired cognition, total assist for all bed-level activity, dyspnea on exertion, and decreased activity tolerance. Pt to benefit from acute PT to address deficits. Pt sat EOB with mod assist for trunk control and steadying, pt fatiguing after 7-10 minutes sitting EOB. PT recommending SNF level of care post-acutely due to pt deficits, pt appears to have dramatically declined since pt's last hospitalization. PT to progress mobility as tolerated, and will continue to follow acutely.      Follow Up Recommendations SNF;Supervision/Assistance - 24 hour    Equipment Recommendations  Other (comment)(defer)    Recommendations for Other Services       Precautions / Restrictions Precautions Precautions: Fall Precaution Comments: monitor O2 sats and HR Restrictions Weight Bearing Restrictions: No      Mobility  Bed Mobility Overal bed mobility: Needs Assistance Bed Mobility: Rolling;Supine to Sit;Sit to Supine Rolling: Total assist   Supine to sit: Total assist;+2 for physical assistance Sit to supine: Total assist;+2 for physical assistance   General bed mobility comments: pt did not assist with bed mobility, total assist +2 for LE and trunk management, scooting to and from EOB, and rolling for pericare.  Transfers                 General transfer comment: not attempted  Ambulation/Gait                Stairs            Wheelchair Mobility    Modified Rankin (Stroke Patients  Only)       Balance Overall balance assessment: Needs assistance Sitting-balance support: Bilateral upper extremity supported;Feet supported Sitting balance-Leahy Scale: Poor Sitting balance - Comments: requires mod assist to maintain upright sitting. Posterior leaning with fatigue. Postural control: Posterior lean   Standing balance-Leahy Scale: (NT) Standing balance comment: at least mod A to sit up                             Pertinent Vitals/Pain Pain Assessment: Faces Faces Pain Scale: Hurts little more Pain Location: "my butt" Pain Descriptors / Indicators: Grimacing;Moaning Pain Intervention(s): Limited activity within patient's tolerance;Monitored during session;Repositioned    Home Living Family/patient expects to be discharged to:: Private residence Living Arrangements: Spouse/significant other Available Help at Discharge: Family Type of Home: House Home Access: Stairs to enter Entrance Stairs-Rails: Can reach both Entrance Stairs-Number of Steps: 2-3 Home Layout: Two level;Bed/bath upstairs Home Equipment: Walker - 2 wheels;Grab bars - toilet Additional Comments: was at home with wife.  Has walker and grab bars at toilet    Prior Function Level of Independence: Independent         Comments: independent at baseline prior to previous admission; above information gathered from previous notes     Hand Dominance   Dominant Hand: Right    Extremity/Trunk Assessment   Upper Extremity Assessment Upper Extremity Assessment: Generalized weakness(per OT note)    Lower Extremity Assessment Lower Extremity Assessment: Generalized weakness;Difficult to assess due to impaired cognition(Pt did not move LEs  when asked by PT, pt permitted PROM LEs. LE ROM WNL.)    Cervical / Trunk Assessment Cervical / Trunk Assessment: Kyphotic  Communication   Communication: HOH  Cognition Arousal/Alertness: Awake/alert Behavior During Therapy: WFL for tasks  assessed/performed Overall Cognitive Status: Impaired/Different from baseline Area of Impairment: Safety/judgement;Following commands;Attention;Orientation;Problem solving                 Orientation Level: Disoriented to;Place;Time;Situation Current Attention Level: Focused   Following Commands: Follows one step commands inconsistently(not following commands) Safety/Judgement: Decreased awareness of safety;Decreased awareness of deficits   Problem Solving: Slow processing;Decreased initiation;Difficulty sequencing;Requires verbal cues;Requires tactile cues General Comments: pt's verbalized and did say he had to lie down; much of what he said was incomplete and did not make sense      General Comments General comments (skin integrity, edema, etc.): 4LO2 via [place], sats 96% and above. HR 110-121 during session with RR in 20s. DOE 2/4 noted with mobility    Exercises     Assessment/Plan    PT Assessment Patient needs continued PT services  PT Problem List Decreased activity tolerance;Decreased mobility;Decreased range of motion;Decreased strength;Decreased balance;Decreased knowledge of use of DME;Decreased safety awareness       PT Treatment Interventions Functional mobility training;Therapeutic activities;Gait training;Therapeutic exercise;DME instruction;Balance training;Patient/family education    PT Goals (Current goals can be found in the Care Plan section)  Acute Rehab PT Goals Patient Stated Goal: none stated PT Goal Formulation: Patient unable to participate in goal setting Time For Goal Achievement: 08/14/18 Potential to Achieve Goals: Good    Frequency Min 2X/week   Barriers to discharge        Co-evaluation PT/OT/SLP Co-Evaluation/Treatment: Yes Reason for Co-Treatment: For patient/therapist safety PT goals addressed during session: Mobility/safety with mobility OT goals addressed during session: ADL's and self-care       AM-PAC PT "6 Clicks" Mobility   Outcome Measure Help needed turning from your back to your side while in a flat bed without using bedrails?: Total Help needed moving from lying on your back to sitting on the side of a flat bed without using bedrails?: Total Help needed moving to and from a bed to a chair (including a wheelchair)?: Total Help needed standing up from a chair using your arms (e.g., wheelchair or bedside chair)?: Total Help needed to walk in hospital room?: Total Help needed climbing 3-5 steps with a railing? : Total 6 Click Score: 6    End of Session Equipment Utilized During Treatment: Oxygen Activity Tolerance: Patient limited by fatigue Patient left: in bed;with bed alarm set;with SCD's reapplied;with call bell/phone within reach Nurse Communication: Mobility status PT Visit Diagnosis: Muscle weakness (generalized) (M62.81);Other abnormalities of gait and mobility (R26.89)    Time: 2122-4825 PT Time Calculation (min) (ACUTE ONLY): 24 min   Charges:   PT Evaluation $PT Eval Low Complexity: 1 Low         Tatyanna Cronk Conception Chancy, PT Acute Rehabilitation Services Pager [phone]  Office [phone]  Alfred Harrel D Elonda Husky 07/31/2018, 5:12 PM

## 2018-07-31 NOTE — Progress Notes (Signed)
Spoke with wife on the phone and answered questions about care for husband. She also requested to speak with the Hospitalist and GI provider. I notified both the GI provider and Hospitalist of the wife's request to speak with them. Wife's contact information is listed in the chart. Will continue to update wife.

## 2018-07-31 NOTE — Consult Note (Signed)
   [facility] CM Inpatient Consult   07/31/2018  Luis Cisneros Mar 09, 1940 [id number]  Received a call from inpatient [facility] RNCM on 07/24/2018 regarding patient who had already transitioned home 07/29/2018 and with concerns for potential rehab needs.  Noted that the patient had already been referred to [facility] office at this time by patient's primary care provider [08/05/2018].  [facility] Social Worker was assigned and aware of the new referral.  Patient showing "Medium risk" for unplanned readmission however patient has readmitted within 7 days.  Patient has admitted back to the hospital at [facility], MD notes reveals as follows from Dr. Oretha Milch:  Luis Cisneros is a 78 y.o. 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  His wife was concerned as after discharge he was unable to even bear his own weight to get out of the car and she required the assistance of his neighbors with reported fall.  He was seen by PT at home who was also concerned about his significant weakness, lethargy, and decreased alertness. She discussed his significant weakness with his PCP in addition to worsening abdominal and lower leg swelling and was advised to come to ED.    Current disposition and progress assessments are ongoing and patient could benefit from PT/OT assessments.  Plan: Will update [facility] [employer] [facility], of admission and follow up for disposition needs. Spoke with inpatient TOC, Juliann Pulse, that we will continue to follow as appropriate.  For questions, please contact [place].   Natividad Brood, RN BSN [facility] Liaison  [phone] business mobile  phone Toll free office [phone]  Fax number: [phone] [email or web address]

## 2018-07-31 NOTE — Progress Notes (Signed)
HEMATOLOGY-ONCOLOGY PROGRESS NOTE  SUBJECTIVE: Readmitted with failure to throve and confusion and anasarca CT Abd in ED: revealed splenic infarction with splenomegaly, anasarca. GI was consulted and their opinion was that it could be due to CHF. We are consulted for thrombocytopenia and management of coagulopathy  OBJECTIVE: REVIEW OF SYSTEMS:   Not obtained  I have reviewed the past medical history, past surgical history, social history and family history with the patient and they are unchanged from previous note.   PHYSICAL EXAMINATION: ECOG PERFORMANCE STATUS: 4 - Bedbound  Vitals:   07/31/18 1500 07/31/18 1502  BP: 126/72   Pulse: (!) 110   Resp:    Temp:  98.5 F (36.9 C)  SpO2: 96%    Filed Weights   07/13/2018 1646 07/31/18 0418  Weight: 181 lb (82.1 kg) 186 lb 15.2 oz (84.8 kg)    Not performed  LABORATORY DATA:  I have reviewed the data as listed CMP Latest Ref Rng & Units 07/31/2018 07/29/2018 07/29/2018  Glucose 70 - 99 mg/dL 89 102(H) 75  BUN 8 - 23 mg/dL 21 22 13   Creatinine 0.61 - 1.24 mg/dL 0.67 0.78 0.66  Sodium 135 - 145 mmol/L 129(L) 127(L) 132(L)  Potassium 3.5 - 5.1 mmol/L 4.0 4.0 3.9  Chloride 98 - 111 mmol/L 100 98 103  CO2 22 - 32 mmol/L 19(L) 22 22  Calcium 8.9 - 10.3 mg/dL 7.9(L) 8.0(L) 8.0(L)  Total Protein 6.5 - 8.1 g/dL 5.0(L) 5.4(L) 5.6(L)  Total Bilirubin 0.3 - 1.2 mg/dL 2.2(H) 1.9(H) 2.3(H)  Alkaline Phos 38 - 126 U/L 203(H) 239(H) 264(H)  AST 15 - 41 U/L 101(H) 111(H) 108(H)  ALT 0 - 44 U/L 34 37 43    Lab Results  Component Value Date   WBC 12.0 (H) 07/31/2018   HGB 10.0 (L) 07/31/2018   HCT 33.3 (L) 07/31/2018   MCV 87.2 07/31/2018   PLT 38 (L) 07/31/2018   NEUTROABS 2.7 07/29/2018    ASSESSMENT AND PLAN: 1. Severe Thrombocytopenia: Suspect increased consumption rather than decreased production.  -Immature platelet fraction - Smear: No schistocytes - Platelet transfusion: because of anticoagulation 2. PE : suspect that  its causing the CHF. On Lovenox. HIT Ab Neg 3. Lupus Anticoagulant Pending 4. Elevated LFTs and [place] Will follow closely

## 2018-07-31 NOTE — Progress Notes (Addendum)
Visitation:  6/24 Admission:  On admission the night of 6/24, after a discussion with [facility], I allowed the patient's wife to come into the unit and be with Mr. Garden for approx. 1.5 - 2 hours.  I explained the current systemwide hospital restrictions to visitation d/t the Covid pandemic.  After this discussion, Mimi (patient's wife) and I agreed that she could stay until midnight (which was the 1.5 - 2 hours mentioned above).  At [facility], I had another discussion with Mimi and she advised that she didn't have any current concerns with going home at this time and she left the unit at approx 0030.  Prior to leaving telephone numbers of the unit, charge nurse, and current nurse were written down and gave to her with instruction to call if any concerns.  6/25 Morning:  Patient's wife called primary nurse at [facility] to check about being present when the attending MD made rounds.  Due to her concern of not being able to talk with MD in person, we made a plan for her to come to the patient's room at 0730 have discussion with MD, and then head back home.  Christian (dayshift charge) advised me that this happened.  6/25: Evening:  Patient's wife wanted an update on patient, but first asked if she could come back up to the unit and say good night to her husband.  After some discussion, I advised her against this at this time due to the visitor restrictions.  She voiced some concern regarding that the patient may become agitated without her presence.  I recommended to her that we could take the wireless phone in the room and she could talk with the patient, and/or she could use the E-link camera to be able to do a video chat in the room with her.  She currently opted to talk with patient on phone, but wanted update first.  Phone gave to [employer] (nightshift nurse) to give her an update. Currently, patient is resting, no agitation noted.  If patient becomes agitated or their is a safety concern, I advised Junie Panning to tell the wife  that we will call her and let her know and could discuss how to proceed with visitation at that point.  Jacqulyn Ducking ICU/SD Care Coordinator / Rapid Response Nurse Rapid Response Number:  [phone]

## 2018-07-31 NOTE — Progress Notes (Addendum)
[facility] PROGRESS NOTE  BRYCETON HANTZ OPF:[id number] DOB: 1940-02-10 DOA: 07/14/2018 PCP: Elby Showers, MD  Assessment/Plan: 1. Diffuse Anasarca. Pleural effusion, ascites, small pericardial effusion on imaging. Normal kidney function, some concern for liver etiology with elevated INR, thrombocytopenia, elevated LFTs and findings on recent abdominal ultrasound, see more at #2. Has low albumin which is likely contributing ( mix of liver disease and poor nurtrition?), another consideration is hepatic congestion related to right sided CHF from possible heart strain from known PE. BNP unremarkable, pending repeat TTE to assess RV function, cardiology will evaluate in am  2. Concern for liver disease, likely hepatic congestion related to clot burden/secondary pulmonary HTN. Initial concern for hepatocellular disease or hepatic steatosis based on last abd u/s but mild LFT elevation could be seen in CHF from associated clot burden. GI recs TTE as above. Less likely primarly liver disease given no portal flow disruption on doppler, hep panel negative (documented history of hep B pos core antibody per PCP), wife denies alcohol hx, no known liver disease in family  consult GI- may need more imaging/lab work up .  3. Acute hyponatremia, slowly improving likely hypervolemic due to #1. Careful monitoring, fluid restrict 4. Subacute Intermittent Confusion, slow speech and decrease in independent activities progressive x 2 months, unclear etiology Currently. Alert and oriented to place, time, person, poor memory and decrease in independent activities x2 months per wife. Questionable asterixis on my exam ? GI thinks not likely dc lactulose/rifaxan. Ammonia wnl.Normal CO2 on VBG, CT head chronic microvascular changes, nothing acute and no acute deficits on exam 5. Splenomegaly and subacute infarcts. Has known blood clots in lung. Could be malignant, have changed from xarelto to lovenox 6. 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 CTA (6/22) showed near occlusive pe of left lower lobe with similar clot burden. Peripheral opacity ? supleural mass or developing pulmonary infarction.  Discussed with Dr. Melvyn Novas with Pulmonary/PCCM agree with TTE to assess RV function given hemodynamic stability ( mild tachycardia, normal BP, stable O2) will evaluate in am pending TT.  Previously on xarelto at home, advised to change to lovenox per Dr. Lindi Adie with hematology 7. Several Right sided Pulmonary nodules. Non-contrast chest CT in 3-6 months is recommended as outpatient 8. Subacute Thrombocytopenia. Hem suspects consumptive. HIT ab  negative. Hematology recommended lovenox and platelet transfusion (plt 38), closely monitor, no current bleeding 9. Lactic acidosis, mild, stable. lower BP with SBP in 90s on admission improved with 2 L fluid resuscitation. BP staying in 120s/80s currently while monitoring in stepdown 10. Mildly Elevated AST/Alk phos Suspect hepatic congestion from possible RHF from secondary pulmonary htn. Ongoing since end of May. Hep panel negative. No longer taking statin since last admission concern for hepatic disease. Slight elevation in alk phos and total bilirubin no abd pain on exam. Trend cmp 11. Normocytic anemia. Seems new since 06/2018. Check iron panel. ? Anemia of chronic disease? No bleeding episodes 12. Constipation. No BM for a week per wife. Monitor on lactulose. No acute findings on ct abdomen.   13. Generalized weakness. Likely attributed to diffuse anasarca, electrolyte abnormalities and poor alertness related to encephalopathy. Try to reverse medical conditions. PT/OT consulted 14. Depression. Stable. Home bupropion 15. GPR in culture from 6/22. Suspect contaminant since only 1 of 2 cultures positive, admitting physician discussed with ID as well who agreed. Monitor repeat blood cultures from 6/24, check tte. Currently  afebrile  I spent greater than 35 minutes in care for patient  Code Status: Full Code Family Communication: Discussed with spouse Mimi McGinn at bedside on 6/25 (indicate person spoken with, relationship, and if by phone, the number) Disposition Plan: close monitoring of BP, low platelets, mental status, sodium. GI consulted. Pulm/PCCM and Cardiology to evaluate in am. TTE pending   Consultants:  Gastroenterology 6/25  Cardiology 6/26  Pulmonary/PCCM 6/26  Procedures:  none  Antibiotics:  none(indicate start date, and stop date if known)  HPI/Subjective:  Luis Cisneros is a 78 y.o. year old male with medical history significant for recently diagnosed pulmonary embolus and bilateral lower extremity DVTs on Xarelto anticoagulation, hypertension, hyperlipidemia, depression, and hard of hearing who presented on 07/17/2018 with complaints of worsening weakness, confusion and abdominal distension after most recent discharge from hospital on 6/23.  His wife was concerned as after discharge he was unable to even bear his own weight to get out of the car and she required the assistance of his neighbors with reported fall.  He was seen by PT at home who was also concerned about his significant weakness, lethargy, and decreased alertness. She discussed his significant weakness with his PCP in addition to worsening abdominal and lower leg swelling and was advised to come to ED.    Wife has noticed steady decline that started for initial evaluation of prolonged cough ( starting in April, COVID negative at time) treated with doxycycline x 10 days when evaluated by [facility] in 5/29. Labwork obtained for evaluation of unexplained fatigue found to have mildly elevated liver enzymes (AST, ALT, alk phos) on follow up with PCP on 6/12.  During that visit patient looked much weaker, confused, tachycardic and hypoxic.  His PCP instructed him to go to ED at that time which is when he was initially admitted on 6/12 and  found to have PE (discussed more below).  Per wife he has not had a bowel movement in a week, has difficulty initiating urination, continues to have intermittent confusion and lethargy ( ongoing since May).  his previous baseline he was functional and independent). 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 Also she noticed hand shakes when reaching for something around the same time.  Wife noticed confusion about 24-36 hours before June 12 admission.  She would say something to him and it would take him a while to process it and understand it. She attributed to his poor hearing ( doesn't always wear hearing aids).  Sometimes it would be confusion about conversations he would think he had with his wife but really had with his daughter. Prior to this she states his memory was great.    Hospital course from prior admissions 07/18/2018-07/22/2018 Found to have a left segmental pulmonary embolus and bilateral lower extremity DVTs.  Patient was treated with IV heparin for 72 hours and transition to oral Xarelto on discharge.  He was discharged on 2 L home supplemental oxygen due to persistent hypoxia while admitted.  2D echo 07/19/2018 was negative for right ventricular strain was 55-60%.    Hypercoagulable work-up obtained 07/18/2018 was notable for decreased protein S activity of 37% with total protein S antigen within normal limits at 143%.  Lupus anticoagulant DRVVT was 55.2 and corrected on mixing study.  Total protein C was decreased at 53% with protein C activity within normal limits at 78%.  07/27/2018-07/29/2018  Chief complaint of generalized weakness/confusion and lethargy.  He was treated with supportive care.  Repeat CTA chest showed persistent known PE.  He was also noted to have worsening thrombocytopenia concerning for HIT.  HIT  antibody was obtained and has resulted negative today 07/19/2018.  He has been on continued Xarelto anticoagulation.    He was also noted to have continued abnormal LFTs of unknown etiology.  Previous hepatitis B panel was negative and acute hepatitis panel was negative.  RUQ ultrasound showed gallbladder sludge without sonographic evidence for acute cholecystitis but did show echogenic liver suggesting steatosis and/or hepatocellular disease.  Left hepatic lobe cyst was noted.  Follow-up ultrasound liver Doppler showed normal hepatic venous Dopplers.  PT/OT were consulted and recommended home health upon discharge.     No complaints this am No abdominal pain Resting comfortably  Objective: Vitals:   07/31/18 0400 07/31/18 0500  BP: (!) 109/46 (!) 99/41  Pulse: (!) 109 (!) 105  Resp: 17 (!) 24  Temp: 97.7 F (36.5 C)   SpO2: 95% 96%    Intake/Output Summary (Last 24 hours) at 07/31/2018 6720 Last data filed at 07/31/2018 0500 Gross per 24 hour  Intake 1495.14 ml  Output 100 ml  Net 1395.14 ml   Filed Weights   07/12/2018 1646 07/31/18 0418  Weight: 82.1 kg 84.8 kg    Exam:   General:  Elderly male, no distress  Cardiovascular: tachycardic, 1+ pitting edema to below knee  Respiratory: normal effort on 2 L [place], clear breath sounds  Abdomen: soft, distended, non-tender, no obvious fluid wave, decreased bowel sounds  Skin no rash, dry, intact  Neurologic somnolent but easily arousable, slow speech, oriented to self, place ( says [facility]), time( year and month). Able to name spouse at bedside. Very hard of hearing. No appreciable focal deficits  Data Reviewed: Basic Metabolic Panel: Recent Labs  Lab 07/27/18 1146 07/28/18 0508 07/29/18 0352 07/16/2018 1709 07/31/18 0212  NA 131* 131* 132* 127* 129*  K 3.9 3.9 3.9 4.0 4.0  CL 95* 99 103 98 100  CO2 _0 19*  GLUCOSE 85 72 75 102* 89  BUN _1 CREATININE 0.77 0.65 0.66 0.78 0.67   CALCIUM 9.1 8.2* 8.0* 8.0* 7.9*  MG 2.0  --  1.9  --  2.1  PHOS  --   --  2.0*  --  1.7*   Liver Function Tests: Recent Labs  Lab 07/27/18 1146 07/28/18 0508 07/29/18 0352 07/13/2018 1709 07/31/18 0212  AST 102* 97* 108* 111* 101*  ALT 56* 47* 43 37 34  ALKPHOS 321* 286* 264* 239* 203*  BILITOT 1.9* 2.1* 2.3* 1.9* 2.2*  PROT 6.5 5.7* 5.6* 5.4* 5.0*  ALBUMIN 2.2* 1.9* 1.9* 1.7* 1.7*   No results for input(s): LIPASE, AMYLASE in the last 168 hours. Recent Labs  Lab 07/27/18 1528  AMMONIA 35   CBC: Recent Labs  Lab 07/27/18 1146 07/28/18 0508 07/29/18 0352 07/29/18 1502 07/26/2018 1709 07/31/18 0212  WBC 4.6 5.4 6.5 8.5 12.0* 12.0*  NEUTROABS 2.7  --  2.3 2.7  --   --   HGB 11.9* 10.5* 10.5* 10.7* 10.6* 10.0*  HCT 38.2* 34.1* 34.3* 34.3* 33.6* 33.3*  MCV 85.7 87.0 86.6 85.8 84.4 87.2  PLT 112* 101* 70* 62* 45* 38*   Cardiac Enzymes: No results for input(s): CKTOTAL, CKMB, CKMBINDEX, TROPONINI in the last 168 hours. BNP (last 3 results) No results for input(s): BNP in the  last 8760 hours.  ProBNP (last 3 results) No results for input(s): PROBNP in the last 8760 hours.  CBG: Recent Labs  Lab 07/28/18 0635 07/21/2018 1730  GLUCAP 90 103*    Recent Results (from the past 240 hour(s))  Urine culture     Status: Abnormal   Collection Time: 07/27/18 11:46 AM   Specimen: Urine, Random  Result Value Ref Range Status   Specimen Description   Final    URINE, RANDOM Performed at [facility] [address], [place], [place] [address]    Special Requests   Final    NONE Performed at [facility], [place] [address], [place], [place] [address]    Culture MULTIPLE SPECIES PRESENT, SUGGEST RECOLLECTION (A)  Final   Report Status 07/28/2018 FINAL  Final  SARS Coronavirus 2 (CEPHEID - Performed in [facility] hospital lab), Hosp Order     Status: None   Collection Time: 07/27/18 11:47 AM   Specimen: Nasopharyngeal Swab  Result Value  Ref Range Status   SARS Coronavirus 2 NEGATIVE NEGATIVE Final    Comment: (NOTE) If result is NEGATIVE SARS-CoV-2 target nucleic acids are NOT DETECTED. The SARS-CoV-2 RNA is generally detectable in upper and lower  respiratory specimens during the acute phase of infection. The lowest  concentration of SARS-CoV-2 viral copies this assay can detect is 250  copies / mL. A negative result does not preclude SARS-CoV-2 infection  and should not be used as the sole basis for treatment or other  patient management decisions.  A negative result may occur with  improper specimen collection / handling, submission of specimen other  than nasopharyngeal swab, presence of viral mutation(s) within the  areas targeted by this assay, and inadequate number of viral copies  (<250 copies / mL). A negative result must be combined with clinical  observations, patient history, and epidemiological information. If result is POSITIVE SARS-CoV-2 target nucleic acids are DETECTED. The SARS-CoV-2 RNA is generally detectable in upper and lower  respiratory specimens dur ing the acute phase of infection.  Positive  results are indicative of active infection with SARS-CoV-2.  Clinical  correlation with patient history and other diagnostic information is  necessary to determine patient infection status.  Positive results do  not rule out bacterial infection or co-infection with other viruses. If result is PRESUMPTIVE POSTIVE SARS-CoV-2 nucleic acids MAY BE PRESENT.   A presumptive positive result was obtained on the submitted specimen  and confirmed on repeat testing.  While 2019 novel coronavirus  (SARS-CoV-2) nucleic acids may be present in the submitted sample  additional confirmatory testing may be necessary for epidemiological  and / or clinical management purposes  to differentiate between  SARS-CoV-2 and other Sarbecovirus currently known to infect humans.  If clinically indicated additional testing with an  alternate test  methodology [phone]) is advised. The SARS-CoV-2 RNA is generally  detectable in upper and lower respiratory sp ecimens during the acute  phase of infection. The expected result is Negative. Fact Sheet for Patients:  StrictlyIdeas.no Fact Sheet for Healthcare Providers: BankingDealers.co.za This test is not yet approved or cleared by the [place] FDA and has been authorized for detection and/or diagnosis of SARS-CoV-2 by FDA under an Emergency Use Authorization (EUA).  This EUA will remain in effect (meaning this test can be used) for the duration of the COVID-19 declaration under Section 564(b)(1) of the Act, 21 U.S.C. section 360bbb-3(b)(1), unless the authorization is terminated or revoked sooner. Performed at [facility]  [place] [address] [place], [place] [address]   Culture, blood (routine x 2)     Status: None (Preliminary result)   Collection Time: 07/28/18  8:49 PM   Specimen: BLOOD  Result Value Ref Range Status   Specimen Description   Final    BLOOD BLOOD RIGHT HAND Performed at [facility] [address] [place], [place] [address]    Special Requests   Final    BOTTLES DRAWN AEROBIC AND ANAEROBIC Blood Culture adequate volume Performed at [facility] [address] [place], [place] [address]    Culture   Final    NO GROWTH 2 DAYS Performed at [facility] [address] [place], [place] [address]    Report Status PENDING  Incomplete  Culture, blood (routine x 2)     Status: None (Preliminary result)   Collection Time: 07/28/18  8:49 PM   Specimen: BLOOD  Result Value Ref Range Status   Specimen Description   Final    BLOOD BLOOD RIGHT ARM Performed at [facility] [address] [place], [place] [address]    Special Requests   Final    BOTTLES DRAWN AEROBIC AND ANAEROBIC Blood Culture adequate  volume Performed at [facility] [address], [place], [place] [address]    Culture  Setup Time   Final    GRAM POSITIVE RODS ANAEROBIC BOTTLE ONLY CRITICAL RESULT CALLED TO, READ BACK BY AND VERIFIED WITH: RN D DESOTA [id number] MLM Performed at [facility], [place] [address], [place],  [address]    Culture GRAM POSITIVE RODS  Final   Report Status PENDING  Incomplete  MRSA PCR Screening     Status: None   Collection Time: 07/16/2018 11:20 PM   Specimen: Nasal Mucosa; Nasopharyngeal  Result Value Ref Range Status   MRSA by PCR NEGATIVE NEGATIVE Final    Comment:        The GeneXpert MRSA Assay (FDA approved for NASAL specimens only), is one component of a comprehensive MRSA colonization surveillance program. It is not intended to diagnose MRSA infection nor to guide or monitor treatment for MRSA infections. Performed at [facility], [place] [address] [place],  [address]      Studies: Ct Head Wo Contrast  Result Date: 07/20/2018 CLINICAL DATA:  78 year old male with altered mental status. EXAM: CT HEAD WITHOUT CONTRAST TECHNIQUE: Contiguous axial images were obtained from the base of the skull through the vertex without intravenous contrast. COMPARISON:  None. FINDINGS: Brain: The ventricles and sulci appropriate size for patient's age. Mild periventricular and deep white matter chronic microvascular ischemic changes noted. There is no acute intracranial hemorrhage. No mass effect or midline shift. No extra-axial fluid collection. Vascular: No hyperdense vessel or unexpected calcification. Skull: Normal. Negative for fracture or focal lesion. Sinuses/Orbits: No acute finding. Other: None IMPRESSION: 1. No acute intracranial hemorrhage. 2. Mild chronic microvascular ischemic changes. Electronically Signed   By: Anner Crete M.D.   On: 07/23/2018 20:30   Ct Abdomen Pelvis W Contrast  Result Date: 07/17/2018 CLINICAL  DATA:  Increasing weakness and altered mental status. Recent hospitalization for pulmonary embolism. EXAM: CT ABDOMEN AND PELVIS WITH CONTRAST TECHNIQUE: Multidetector CT imaging of the abdomen and pelvis was performed using the standard protocol following bolus administration of intravenous contrast. CONTRAST:  171m OMNIPAQUE IOHEXOL 300 MG/ML  SOLN COMPARISON:  Abdominopelvic CT 07/18/2018.  Ultrasound 07/27/2018. FINDINGS: Lower  chest: There are small bilateral pleural effusions and mild bibasilar atelectasis. There is a small pericardial effusion. Hepatobiliary: There is a stable cyst in the left hepatic lobe. No suspicious hepatic findings. No evidence of gallstones, gallbladder wall thickening or biliary dilatation. Pancreas: Unremarkable. No pancreatic ductal dilatation or surrounding inflammation. Spleen: Progressive splenomegaly. The spleen measures 16.9 x 11.3 x 17.7 cm (volume = 1770 cm^3) and demonstrates peripheral wedge shaped areas of decreased attenuation suspicious for small splenic infarcts. There is no perisplenic fluid collection. Adrenals/Urinary Tract: The adrenal glands appear stable. 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 There is stable mild fullness of the left adrenal gland. There is a nonobstructing calculus in the upper pole of the left kidney and bilateral renal sinus cyst formation. Proximal left ureteral calculus measuring 5 mm on image 55/2 is unchanged in position. There is no significant hydronephrosis or delay in contrast excretion. Mild bladder wall thickening, likely due to incomplete distension. Stomach/Bowel: No evidence of bowel wall thickening, distention or surrounding inflammatory change. The appendix is not definitely visualized. There are mild diverticular changes of the distal colon. Vascular/Lymphatic: Mildly prominent lymph nodes within the gastrohepatic ligament and porta hepatis are stable. There is no  retroperitoneal adenopathy. There is diffuse aortic and branch vessel atherosclerosis without acute vascular findings. Reproductive: Stable mild enlargement of the prostate gland with dystrophic calcifications. Other: There is diffuse edema throughout the subcutaneous and intra-abdominal fat, including the perirectal fat. There is a small amount of pelvic ascites. No focal extraluminal fluid collection or pneumoperitoneum. Musculoskeletal: No acute or significant osseous findings. There are diffuse degenerative changes throughout the lumbar spine. Patient is status post left total hip arthroplasty. Moderate right hip degenerative changes are present. IMPRESSION: 1. Progressive splenomegaly with wedge-shaped areas of decreased attenuation peripherally suspicious for subacute splenic infarcts. 2. Unchanged position of proximal left ureteral calculus compared with CT of 12 days ago. No significant hydronephrosis or delay in contrast excretion. 3. Interval development of diffuse soft tissue edema, ascites and bilateral pleural effusions consistent with anasarca. 4.  Aortic Atherosclerosis (ICD10-I70.0). Electronically Signed   By: Richardean Sale M.D.   On: 07/20/2018 21:01   Dg Chest Port 1 View  Result Date: 07/16/2018 CLINICAL DATA:  Altered mental status EXAM: PORTABLE CHEST 1 VIEW COMPARISON:  Chest radiograph and chest CT July 27, 2018 FINDINGS: There is mild atelectasis in the left base with minimal left pleural effusion. Lungs elsewhere clear. Heart is upper normal in size with pulmonary vascularity normal. No adenopathy. No bone lesions. IMPRESSION: Mild left base atelectasis with minimal left pleural effusion. Lungs elsewhere clear. Stable cardiac silhouette. Electronically Signed   By: Lowella Grip III M.D.   On: 07/25/2018 19:00    Scheduled Meds: . buPROPion  300 mg Oral Daily  . camphor-menthol   Topical BID  . enoxaparin (LOVENOX) injection  80 mg Subcutaneous Q12H  . lactulose  10 g Oral  TID  . mouth rinse  15 mL Mouth Rinse BID  . rifaximin  550 mg Oral BID  . senna-docusate  1 tablet Oral BID  . sodium chloride (PF)       Continuous Infusions: . sodium phosphate  Dextrose 5% IVPB 43 mL/hr at 07/31/18 0500    Principal Problem:   Generalized weakness Active Problems:   History of pulmonary embolism   Depression   Transaminitis   Constipation   Hyponatremia   Thrombocytopenia (HCC)   Encephalopathy  [facility]

## 2018-07-31 NOTE — Patient Outreach (Signed)
[facility]) Care Management [place] Telephone Outreach Care Coordination  07/31/2018  Luis Cisneros 12/25/1940 [id number]   [place] Coordination outreach in coverage of primary [place] RN CM re:  Luis Cisneros, 78 y/o male with multiple recent hospitalizations as noted below; Patient has history including, but not limited to, HTN, HLD, and kidney stones  June 12-16, 2020 with acute pulmonary embolism/ DVT/ hypoxia June 21-23, 2020 with weakness, fever, confusion, lethargy/ acute on chronic respiratory failure abnormal LFTs  Noted from review of EMR that patient re-presented to ED yesterday, 07/21/2018 with ongoing weakness, lethargy, encephalopathy, SIRS, transaminitis, thrombocytopenia, and hypoalbuminemia; patient had been discharged the day before this admission  Patient is currently admitted at [facility] with discharge status pending.  Secure communication via EMR sent to [place] [facility] [place],  [place], and primary [place], notifying of patient's hospital readmission yesterday.  Plan:  [facility] RN CM will follow patient's progress while hospitalizedand collaborate with [facility] liaison's for discharge disposition.  Oneta Rack, RN, BSN, [employer] [facility] Care Management  [phone]

## 2018-07-31 NOTE — Patient Outreach (Signed)
[facility]) Care Management  07/31/2018  Luis Cisneros 1940-02-15 [id number]   CSW was planning to make initial contact with patient and patient's wife, Dorothea Glassman today to perform the initial phone assessment, as well as assess and assist with social work needs and services; however, CSW noted that patient was readmitted into the hospital.  Patient presented to the [facility] Emergency Department, via EMS ([facility]), on Wednesday, July 30, 2018, around 4:30PM.  Patient was found to have alternated mental status, shortness of breath, lethargy and fatigue.  Mrs. Sherrie George admitted that patient had also fallen several times within a 24 period of time, having just been discharged from the hospital on Tuesday, July 29, 2018.    Mrs. Sherrie George is requesting skilled nursing placement for patient for short-term rehabilitative services, upon discharge from the hospital, as Mrs. McGinn admits that she is no longer able to properly care for patient in the home.  CSW has alerted [employer], [facility], also with [facility], of this request.  Patient will be assigned an inpatient hospital social worker to assist with the placement process.  CSW will continue to follow patient to offer social work assistance as needed, as well as provide community case management services to patient once he has been discharged back home.  CSW will continue to review patient's medical record to stay informed of patient's discharge plan of care.  Nat Christen, BSW, MSW, LCSW  Licensed Education officer, [facility]  Mailing [address], [place], [place] [address] Physical Address-[address], [place], [place] [address] Toll Free Main # [phone] Fax # [phone] Cell # [phone]  Office # [phone] [email or web address]

## 2018-07-31 NOTE — Consult Note (Addendum)
Consultation  Referring Provider: [facility]/ Dr. Lonny Prude Primary Care Physician:  Elby Showers, MD Primary Gastroenterologist:  none  Reason for Consultation: Anasarca-possible hepatic dysfunction  HPI: Luis Cisneros is a 78 y.o. male , who we are asked to see regarding concern for possible underlying liver disease with development of diffuse anasarca, mild coagulopathy, thrombocytopenia and mildly elevated LFTs. She has no prior history of liver disease.  He has history of arthritis, depression and is status post total hip replacement.  He was initially admitted 07/18/2018 with shortness of breath and fatigue.  He had previously been diagnosed with a pulmonary embolism in 2006 was provoked.  He was found to have bilateral lower extremity DVTs and a large PE with concern for right heart strain on CT angio.  He also underwent CT of the abdomen and pelvis at that time because of an elevated bilirubin.  Liver was felt to be normal, there was mild left hydronephrosis in [place] to a 7 mm proximal left ureteral calculus otherwise negative study. Patient was discharged home on Xarelto. He was readmitted 6-21 through 07/29/2018 with weakness fever confusion and failure to thrive.  He was noted to have some persistent elevation in LFTs, also with thrombocytopenia.  He was seen by hematology.  It was suspected that actually had an area of infarct in the left lung and was for repeat CT in 3 months.  Thrombocytopenia was felt likely consumptive. Upper abdominal ultrasound on 07/27/2018 noted some increased hepatic echogenicity, a small cyst in the left hepatic lobe, portal vein patent on Doppler, gallbladder sludge. Repeat CT angios 21 2020 showed a nearly occlusive pulmonary embolus within a basilar segment of the left lower lobe, small pericardial effusion, peripheral opacity in the left lung base distal to the embolus possible developing pulmonary infarct right-sided pulmonary nodules  Hepatic Doppler 22 2020  normal hepatic venous Doppler. He was discharged again on 07/29/2018, and brought back to the emergency room yesterday afternoon by his wife after he fell trying to get out of the car.  He was noted to be very weak and lethargic, confused  CT of the head was negative showing only mild chronic microvascular ischemia Chest x-ray showed mild left basilar atelectasis minimal left effusion. CT the abdomen was repeated again showing small bilateral pleural effusions, small pericardial effusion normal-appearing liver with no suspicious hepatic findings there is progressive splenomegaly, spleen measuring 16 x 11.3 x 19.7 demonstrated peripheral wedge-shaped areas of decreased attenuation suspicious for splenic infarcts.  Was tachycardic on admit with temp 99.9.  Patient had had 2 previous COVID test negative, repeat test pending.  Patient hypoxic with PO2 71 on admit Blood cultures ordered and pending WBC 12, hemoglobin 10 hematocrit 33.3, platelets 38 (this is down from 72 days ago) T bili 2.2/alk phos 203/ALT 34/AST 101/albumin 1.1 Lactate elevated at 2.8, improved today INR 1.5.  Patient is not able to give me any history today, he awakens to voice, moves his arms, attempted to [facility] and answer but not able to give any history.     ,  Past Medical History:  Diagnosis Date  . Ankle fracture 2006   right  . Arthritis   . Depression   . Hyperlipidemia   . Hypertension   . Pulmonary embolism ([place])   . Vitamin D deficiency     Past Surgical History:  Procedure Laterality Date  . TONSILLECTOMY    . 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 TOTAL HIP  ARTHROPLASTY Left 04/28/2014   Procedure: TOTAL HIP ARTHROPLASTY;  Surgeon: Frederik Pear, MD;  Location: [place];  Service: Orthopedics;  Laterality: Left;    Prior to Admission medications   Medication Sig Start Date End Date Taking? Authorizing Provider  buPROPion (WELLBUTRIN XL) 300 MG 24 hr tablet TAKE 1 TABLET BY MOUTH EVERY  DAY 07/28/18  Yes Baxley, Cresenciano Lick, MD  camphor-menthol [facility]) lotion Apply 1 application topically 2 (two) times a day.   Yes [provider]  Cholecalciferol (VITAMIN D3) 250 MCG (10000 UT) capsule Take 10,000 Units by mouth daily.   Yes [provider]  feeding supplement, ENSURE ENLIVE, (ENSURE ENLIVE) LIQD Take 237 mLs by mouth daily. 07/29/18  Yes Sheikh, Omair Latif, DO  Multiple Vitamin (MULTIVITAMIN) tablet Take 1 tablet by mouth daily.     Yes [provider]  Rivaroxaban 15 & 20 MG TBPK Take as directed on package: Start with one '15mg'$  tablet by mouth twice a day with food. On Day 22, switch to one '20mg'$  tablet once a day with food. Patient taking differently: Take 15-20 mg by mouth See admin instructions. Take as directed on package: Start with 15 mg by mouth twice daily with food. On Day 22, switch to 20 mg daily with food. 07/22/18  Yes Eugenie Filler, MD  traMADol (ULTRAM) 50 MG tablet Take 1 tablet (50 mg total) by mouth every 6 (six) hours as needed. Patient taking differently: Take 50 mg by mouth every 6 (six) hours as needed for moderate pain.  12/19/17  Yes Stefanie Libel, MD  senna (SENOKOT) 8.6 MG TABS tablet Take 1 tablet (8.6 mg total) by mouth at bedtime. Patient not taking: Reported on 07/19/2018 07/22/18   Eugenie Filler, MD    Current Facility-Administered Medications  Medication Dose Route Frequency Provider Last Rate Last Dose  . acetaminophen (TYLENOL) tablet 650 mg  650 mg Oral Q6H PRN [name], MD       Or  . acetaminophen (TYLENOL) suppository 650 mg  650 mg Rectal Q6H PRN [name], MD      . buPROPion (WELLBUTRIN XL) 24 hr tablet 300 mg  300 mg Oral Daily [name], MD   300 mg at 07/31/18 1015  . camphor-menthol (SARNA) lotion   Topical BID [name], NP      . Derrill Memo ON 08/01/2018] Chlorhexidine Gluconate Cloth 2 % PADS 6 each  6 each Topical Daily [name], MD      . enoxaparin (LOVENOX) injection 80  mg  80 mg Subcutaneous Q12H Dorrene German, RPH   80 mg at 07/31/18 1014  . lactulose (CHRONULAC) 10 GM/15ML solution 10 g  10 g Oral TID [name], MD   10 g at 07/31/18 1015  . MEDLINE mouth rinse  15 mL Mouth Rinse BID [name], MD   15 mL at 07/31/18 0810  . ondansetron (ZOFRAN) tablet 4 mg  4 mg Oral Q6H PRN [name], MD       Or  . ondansetron (ZOFRAN) injection 4 mg  4 mg Intravenous Q6H PRN [name], MD      . rifaximin Doreene Nest) tablet 550 mg  550 mg Oral BID [name], MD   550 mg at 07/31/18 1014  . senna-docusate (Senokot-S) tablet 1 tablet  1 tablet Oral BID [name], MD   1 tablet at 07/28/2018 2359    Allergies as of 07/16/2018 -  Review Complete 07/16/2018  Allergen Reaction Noted  . Cephalexin Nausea Only 10/13/2010  . Feldene [piroxicam] Hives, Nausea Only, and Other (See Comments) 10/13/2010    Family History  Problem Relation Age of Onset  . Aortic aneurysm Mother   . Heart disease Father     Social History   Socioeconomic History  . Marital status: Married    Spouse name: Not on file  . Number of children: Not on file  . Years of education: Not on file  . Highest education level: Not on file  Occupational History  . Not on file  Social Needs  . Financial resource strain: Not on file  . Food insecurity    Worry: Not on file    Inability: Not on file  . Transportation needs    Medical: Not on file    Non-medical: Not on file  Tobacco Use  . Smoking status: Former Smoker    Packs/day: 1.00    Types: Cigarettes    Quit date: 02/06/1987    Years since quitting: 31.5  . Smokeless tobacco: Never Used  Substance and Sexual Activity  . Alcohol use: Yes    Comment: 1 drink  4-5 days a week  . Drug use: No  . Sexual activity: Not on file  Lifestyle  . Physical activity    Days per week: Not on file    Minutes per session: Not on file  . Stress: Not on file  Relationships  . Social Herbalist on  phone: Not on file    Gets together: Not on file    Attends religious service: Not on file    Active member of club or organization: Not on file    Attends meetings of clubs or organizations: Not on file    Relationship status: Not on file  . Intimate partner violence    Fear of current or ex partner: Not on file    Emotionally abused: Not on file    Physically abused: Not on file    Forced sexual activity: Not on file  Other Topics Concern  . Not on file  Social History Narrative  . Not on file    Review of Systems: Pertinent positive and negative review of systems were noted in the above HPI section.  All other review of systems was otherwise negative.Marland Kitchen  Physical Exam: Vital signs in last 24 hours: Temp:  [97.7 F (36.5 C)-99.9 F (37.7 C)] 97.7 F (36.5 C) (06/25 0400) Pulse Rate:  [105-117] 105 (06/25 0900) Resp:  [17-25] 23 (06/25 0900) BP: (99-126)/(41-74) 124/61 (06/25 0900) SpO2:  [93 %-98 %] 97 % (06/25 0900) Weight:  [82.1 kg-84.8 kg] 84.8 kg (06/25 0418) Last BM Date: 07/23/18 General:   Alert,  Well-developed, well-nourished, pleasant and cooperative in NAD Head:  Normocephalic and atraumatic. Eyes:  Sclera clear, no icterus.   Conjunctiva pink. Ears:  Normal auditory acuity. Nose:  No deformity, discharge,  or lesions. Mouth:  No deformity or lesions.   Neck:  Supple; no masses or thyromegaly. Lungs:  Clear throughout to auscultation.   No wheezes, crackles, or rhonchi. Heart:  Regular rate and rhythm; no murmurs, clicks, rubs,  or gallops. Abdomen:  Soft,nontender, BS active,nonpalp mass or hsm.   Rectal:  Deferred  Msk:  Symmetrical without gross deformities. . Pulses:  Normal pulses noted. Extremities:  Without clubbing or edema. Neurologic:  Alert and  oriented x4;  grossly normal neurologically. Skin:  Intact without significant lesions or rashes.Marland Kitchen  Psych:  Alert and cooperative. Normal mood and affect.  Intake/Output from previous day: 06/24 0701 -  06/25 0700 In: 1495.1 [I.V.:3; IV Piggyback:1492.1] Out: 100 [Urine:100] Intake/Output this shift: No intake/output data recorded.  Lab Results: Recent Labs    07/29/18 1502 07/08/2018 1709 07/31/18 0212  WBC 8.5 12.0* 12.0*  HGB 10.7* 10.6* 10.0*  HCT 34.3* 33.6* 33.3*  PLT 62* 45* 38*   BMET Recent Labs    07/29/18 0352 07/29/2018 1709 07/31/18 0212  NA 132* 127* 129*  K 3.9 4.0 4.0  CL 103 98 100  CO2 22 22 19*  GLUCOSE 75 102* 89  BUN '13 22 21  '$ CREATININE 0.66 0.78 0.67  CALCIUM 8.0* 8.0* 7.9*   LFT Recent Labs    07/31/18 0212  PROT 5.0*  ALBUMIN 1.7*  AST 101*  ALT 34  ALKPHOS 203*  BILITOT 2.2*   PT/INR Recent Labs    07/16/2018 1709  LABPROT 17.9*  INR 1.5*   Hepatitis Panel No results for input(s): HEPBSAG, HCVAB, HEPAIGM, HEPBIGM in the last 72 hours.    IMPRESSION:  #63 78 year old white male diagnosed with bilateral lower extremity DVTs and PE on 07/18/2018, there was concern for right heart strain on CT angios, and echo did show right ventricular strain with EF of 55 to 60%. Patient has been readmitted to the hospital twice since that diagnosis.  He has had persistent decline, now with altered mental status, extreme weakness, lethargy and low-grade fevers.  He was hypoxic on readmit last evening.  He has now developed splenic infarcts and splenomegaly Also repeat CT Angio of the chest concerning for pulmonary infarct on the left.  He has developed some subcutaneous edema, small left effusion and small pericardial effusion.  I do not think he has anasarca from underlying liver disease.  There is no ascites or peripheral edema and hepatic imaging has been unremarkable including hepatic Dopplers.  I am concerned that he has symptoms of right heart failure, secondary to large PE.  This could be associated with elevated LFTs secondary to congestion.  #2 thrombocytopenia-likely consumptive with less of splenomegaly and splenic infarcts And unlikely  secondary to underlying liver disease  Plan; check BNP Repeat 2D echo today I discontinued Xifaxan, doubt hepatic encephalopathy though he may have a toxic metabolic encephalopathy Would consider pulmonary and cardiology consultation-?TEE    Amy Esterwood PA-C 07/31/2018, 11:41 AM     Attending physician's note   I have taken an interval history, reviewed the chart and examined the patient. I agree with the Advanced Practitioner's note, impression and recommendations.   CT abdo/pelvis 6/12, 6/24 reviewed. Hepatomegaly and and splenomegaly has increased in size from CT 6/12 to 6/24 with IVC dilatation. Has splenic infarcts as well.  Abnormal LFTs likely d/t congestive hepatomegaly.  No evidence of liver cirrhosis. Suspect right heart failure due to large PE.  Thrombocytopenia is also likely d/t congestive splenomegaly. Pt with generalized anasarca, mild ascites, peripheral edema, pleural/pericardial effusion.  Plan: -2D echo -Discontinue rifaximin. -Consider pulmonary/cardiac consultation. -Trend LFTs. -We will have GI radiologist review both CTs tomorrow. -We will follow along for now.   Carmell Austria, MD [facility] [phone].

## 2018-07-31 NOTE — Progress Notes (Signed)
Initial Nutrition Assessment  RD working remotely.   DOCUMENTATION CODES:   (unable to assess for malnutrition at this time.)  INTERVENTION:  - diet advancement as medically feasible. - will order oral nutrition supplements with diet advancement.    NUTRITION DIAGNOSIS:   Increased nutrient needs related to acute illness, other (see comment)(multiple hospitalizations in 2 weeks) as evidenced by estimated needs.  GOAL:   Patient will meet greater than or equal to 90% of their needs  MONITOR:   Diet advancement, PO intake, Labs, Weight trends  REASON FOR ASSESSMENT:   Malnutrition Screening Tool, Consult Assessment of nutrition requirement/status  ASSESSMENT:   78 year-old male with medical history significant for recently diagnosed pulmonary embolus and BLE DVTs on Xarelto, HTN, hyperlipidemia, depression, and hard of hearing. He presented to the ED for evaluation of generalized weakness, confusion, abdominal distention. Patient was recently admitted from 6/12-6/16 for a L segmental pulmonary embolus and BLE DVTs. He was discharged on home supplemental oxygen due to persistent hypoxia while admitted. He was re-admitted 6/21-6/23 for generalized weakness/confusion and lethargy. Repeat CTA chest showed persistent known PE. He returned to the ED on 6/24 per PCP recommendation.  Patient on droplet and contact precautions for r/o COVID-19. Able to talk with RN outside of patient's room and she reports patient had 2 day test done and results not yet back. Wife is at bedside. RD communicated with RN plan to call into room to talk with wife.  During phone conversation patient's wife provided all information. Patient was having a slowly declining appetite for several weeks and then since admission on 6/12 his appetite and intakes have very drastically decreased to the point of him eating 1/3 or less of the amount he would previously eat. He typically loves savory, salty items (salts most of  his foods and enjoys snacks such as chips) but within the past 2 weeks he has been disinterested in these items and has been preferring cold, sweet items (examples given included gatorade, italian ice, applesauce, jello, and ice cream). Wife reports this is very unusual as he usually "doesn't want more than one jelly bean" in explaining he rarely preferred sweets previously.   He has been having difficulty with meat d/t the time and energy required to chew and consume this item. He has preferred soft, easy to chew items (such as those listed above or squash casserole). The last things he ate were yogurt with granola for breakfast yesterday and canned peaches and watermelon around 2 PM yesterday.   Wife states that she has noticed decrease in muscle tone and wasting to legs, buttocks, waist/hips, and especially in facial area. She reports that abdomen remains distended. Patient usually wears a size 36 pant but recently even a size 34 has been too big. Patient is often very active and "the man laying in this bed is not the same person."   Per chart review, current weight is 187 lb. Wife reports weight on 6/1 was 181 lb and weight in January was 199-202 lb. Will monitor weights closely. Highly suspect some degree of malnutrition but unable to confirm degree without NFPE.    Medications reviewed; 10 lactulose TID, 1 tablet senokot BID. 30 mmol IV NaPhos x1 run 6/25. Labs reviewed; Na: 129 mmol/l, Ca: 7.9 mg/dl, Phos: 1.7 mg/dl, ammonia: 37 umol/L.    NUTRITION - FOCUSED PHYSICAL EXAM:  unable to complete at this time.   Diet Order:   Diet Order  Diet NPO time specified Except for: Sips with Meds  Diet effective now              EDUCATION NEEDS:   Not appropriate for education at this time  Skin:  Skin Assessment: Reviewed RN Assessment  Last BM:  PTA/unknown  Height:   Ht Readings from Last 1 Encounters:  07/28/2018 5\' 10"  (1.778 m)    Weight:   Wt Readings from Last 1  Encounters:  07/31/18 84.8 kg    Ideal Body Weight:  75.4 kg  BMI:  Body mass index is 26.82 kg/m.  Estimated Nutritional Needs:   Kcal:  7169-6789 kcal  Protein:  102-120 grams  Fluid:  >/= 2.1 L/day     Jarome Matin, MS, RD, LDN, [facility] Inpatient Clinical Dietitian Pager # [phone] After hours/weekend pager # [phone]

## 2018-07-31 NOTE — Evaluation (Signed)
Occupational Therapy Evaluation Patient Details Name: Luis Cisneros MRN: [id number] DOB: 1940-12-05 Today's Date: 07/31/2018    History of Present Illness 78 year old with 2 recent admissions, readmitted for generalized weakness, fall,  intermitten confusion and abdominal distention.  Pt with recent PE, HTN, depression and h/o subacute infarcts   Clinical Impression   Pt was admitted for the above.  He is coming in from home for the above. Unsure of how much assistance he needed at home.  Pt requires total A at this time. He was not following commands but did verbally interact, although much of it did not make sense. Will follow in acute setting with the goals listed below.      Follow Up Recommendations  SNF    Equipment Recommendations  (tba further)    Recommendations for Other Services       Precautions / Restrictions Precautions Precautions: Fall Precaution Comments: monitor O2 sats and HR Restrictions Weight Bearing Restrictions: No      Mobility Bed Mobility Overal bed mobility: Needs Assistance Bed Mobility: Rolling Rolling: Total assist   Supine to sit: Total assist;+2 for physical assistance Sit to supine: Total assist;+2 for physical assistance   General bed mobility comments: pt did not assist with bed mobility  Transfers                 General transfer comment: not attempted    Balance     Sitting balance-Leahy Scale: Poor         Standing balance comment: at least mod A to sit up                           ADL either performed or assessed with clinical judgement   ADL Overall ADL's : Needs assistance/impaired                                       General ADL Comments: total A for all adls; +2 for LB for rolling     Vision         Perception     Praxis      Pertinent Vitals/Pain Pain Assessment: Faces Faces Pain Scale: Hurts little more Pain Location: ? Pain Descriptors / Indicators:  Grimacing Pain Intervention(s): Limited activity within patient's tolerance;Monitored during session     Hand Dominance Right   Extremity/Trunk Assessment Upper Extremity Assessment Upper Extremity Assessment: Generalized weakness(did not initiate moving arms)  pt grimaced when RUE moved beyond 60 degrees         Communication Communication Communication: (hoh, incomplete thoughts expressed, soft spoken)   Cognition Arousal/Alertness: Awake/alert Behavior During Therapy: WFL for tasks assessed/performed Overall Cognitive Status: Impaired/Different from baseline                     Current Attention Level: Focused   Following Commands: (not following commands)       General Comments: pt's verbalized and did say he had to lie down; much of what he said was incomplete and did not make sense   General Comments  on 4 liters 02; sats stable.  HR 110-121    Exercises     Shoulder Instructions      Home Living Family/patient expects to be discharged to:: Unsure  Additional Comments: was at home with wife.  Has walker and grab bars at toilet      Prior Functioning/Environment          Comments: pt unable to state.         OT Problem List: Decreased strength;Decreased range of motion;Decreased activity tolerance;Cardiopulmonary status limiting activity;Decreased knowledge of use of DME or AE;Decreased safety awareness;Decreased cognition;Pain      OT Treatment/Interventions: Self-care/ADL training;Therapeutic exercise;Energy conservation;Therapeutic activities;Patient/family education;Balance training;Cognitive remediation/compensation    OT Goals(Current goals can be found in the care plan section) Acute Rehab OT Goals Patient Stated Goal: none stated OT Goal Formulation: With patient Time For Goal Achievement: 08/14/18 Potential to Achieve Goals: Fair ADL Goals Additional ADL Goal #1: pt will sit unsupported  eob with min guard in preparation for adls Additional ADL Goal #2: pt will follow basic one step commands in context 50% of time Additional ADL Goal #3: pt will perform self feeding, and grooming with min A  OT Frequency: Min 2X/week   Barriers to D/C:            Co-evaluation              AM-PAC OT "6 Clicks" Daily Activity     Outcome Measure Help from another person eating meals?: Total Help from another person taking care of personal grooming?: Total Help from another person toileting, which includes using toliet, bedpan, or urinal?: Total Help from another person bathing (including washing, rinsing, drying)?: Total Help from another person to put on and taking off regular upper body clothing?: Total Help from another person to put on and taking off regular lower body clothing?: Total 6 Click Score: 6   End of Session    Activity Tolerance: Patient limited by fatigue Patient left: in bed;with call bell/phone within reach;with family/visitor present  OT Visit Diagnosis: Muscle weakness (generalized) (M62.81)                Time: 0300-9233 OT Time Calculation (min): 22 min Charges:  OT General Charges $OT Visit: 1 Visit OT Evaluation $OT Eval Low Complexity: [place], OTR/L Acute Rehabilitation Services [phone] [facility] pager [phone] office 07/31/2018  [place] 07/31/2018, 4:22 PM

## 2018-08-01 ENCOUNTER — Inpatient Hospital Stay (HOSPITAL_COMMUNITY): Payer: Medicare Other

## 2018-08-01 DIAGNOSIS — E861 Hypovolemia: Secondary | ICD-10-CM

## 2018-08-01 DIAGNOSIS — J9601 Acute respiratory failure with hypoxia: Secondary | ICD-10-CM | POA: Diagnosis present

## 2018-08-01 DIAGNOSIS — R0689 Other abnormalities of breathing: Secondary | ICD-10-CM

## 2018-08-01 DIAGNOSIS — I2699 Other pulmonary embolism without acute cor pulmonale: Secondary | ICD-10-CM

## 2018-08-01 DIAGNOSIS — R0600 Dyspnea, unspecified: Secondary | ICD-10-CM

## 2018-08-01 DIAGNOSIS — R740 Nonspecific elevation of levels of transaminase and lactic acid dehydrogenase [LDH]: Secondary | ICD-10-CM

## 2018-08-01 DIAGNOSIS — R6510 Systemic inflammatory response syndrome (SIRS) of non-infectious origin without acute organ dysfunction: Secondary | ICD-10-CM

## 2018-08-01 DIAGNOSIS — I959 Hypotension, unspecified: Secondary | ICD-10-CM | POA: Insufficient documentation

## 2018-08-01 DIAGNOSIS — I9589 Other hypotension: Secondary | ICD-10-CM

## 2018-08-01 DIAGNOSIS — G9340 Encephalopathy, unspecified: Secondary | ICD-10-CM

## 2018-08-01 DIAGNOSIS — R531 Weakness: Secondary | ICD-10-CM

## 2018-08-01 DIAGNOSIS — R0682 Tachypnea, not elsewhere classified: Secondary | ICD-10-CM

## 2018-08-01 LAB — NOVEL CORONAVIRUS, NAA (HOSP ORDER, SEND-OUT TO REF LAB; TAT 18-24 HRS): SARS-CoV-2, NAA: NOT DETECTED

## 2018-08-01 LAB — DIC (DISSEMINATED INTRAVASCULAR COAGULATION)PANEL
D-Dimer, Quant: 2.59 ug/mL-FEU — ABNORMAL HIGH (ref 0.00–0.50)
Fibrinogen: 241 mg/dL (ref 210–475)
INR: 1.7 — ABNORMAL HIGH (ref 0.8–1.2)
Platelets: 34 10*3/uL — ABNORMAL LOW (ref 150–400)
Prothrombin Time: 19.3 seconds — ABNORMAL HIGH (ref 11.4–15.2)
Smear Review: NONE SEEN
aPTT: 61 seconds — ABNORMAL HIGH (ref 24–36)

## 2018-08-01 LAB — IRON AND TIBC
Iron: 109 ug/dL (ref 45–182)
Saturation Ratios: 67 % — ABNORMAL HIGH (ref 17.9–39.5)
TIBC: 163 ug/dL — ABNORMAL LOW (ref 250–450)
UIBC: 54 ug/dL

## 2018-08-01 LAB — PREPARE PLATELET PHERESIS: Unit division: 0

## 2018-08-01 LAB — BLOOD CULTURE ID PANEL (REFLEXED)

## 2018-08-01 LAB — COMPREHENSIVE METABOLIC PANEL
ALT: 32 U/L (ref 0–44)
AST: 102 U/L — ABNORMAL HIGH (ref 15–41)
Albumin: 1.7 g/dL — ABNORMAL LOW (ref 3.5–5.0)
Alkaline Phosphatase: 178 U/L — ABNORMAL HIGH (ref 38–126)
Anion gap: 12 (ref 5–15)
BUN: 27 mg/dL — ABNORMAL HIGH (ref 8–23)
CO2: 17 mmol/L — ABNORMAL LOW (ref 22–32)
Calcium: 7.7 mg/dL — ABNORMAL LOW (ref 8.9–10.3)
Chloride: 104 mmol/L (ref 98–111)
Creatinine, Ser: 0.74 mg/dL (ref 0.61–1.24)
GFR calc Af Amer: >60 mL/min (ref >60)
GFR calc non Af Amer: >60 mL/min (ref >60)
Glucose, Bld: 71 mg/dL (ref 70–99)
Potassium: 3.8 mmol/L (ref 3.5–5.1)
Sodium: 133 mmol/L — ABNORMAL LOW (ref 135–145)
Total Bilirubin: 2.6 mg/dL — ABNORMAL HIGH (ref 0.3–1.2)
Total Protein: 4.8 g/dL — ABNORMAL LOW (ref 6.5–8.1)

## 2018-08-01 LAB — CBC WITH DIFFERENTIAL/PLATELET
Abs Immature Granulocytes: 0.37 10*3/uL — ABNORMAL HIGH (ref 0.00–0.07)
Basophils Absolute: 0.1 10*3/uL (ref 0.0–0.1)
Basophils Relative: 1 %
Eosinophils Absolute: 0 10*3/uL (ref 0.0–0.5)
Eosinophils Relative: 0 %
HCT: 31.4 % — ABNORMAL LOW (ref 39.0–52.0)
Hemoglobin: 9.6 g/dL — ABNORMAL LOW (ref 13.0–17.0)
Immature Granulocytes: 3 %
Lymphocytes Relative: 32 %
Lymphs Abs: 4.5 10*3/uL — ABNORMAL HIGH (ref 0.7–4.0)
MCH: 26.7 pg (ref 26.0–34.0)
MCHC: 30.6 g/dL (ref 30.0–36.0)
MCV: 87.2 fL (ref 80.0–100.0)
Monocytes Absolute: 5.3 10*3/uL — ABNORMAL HIGH (ref 0.1–1.0)
Monocytes Relative: 37 %
Neutro Abs: 3.8 10*3/uL (ref 1.7–7.7)
Neutrophils Relative %: 27 %
Platelets: 36 10*3/uL — ABNORMAL LOW (ref 150–400)
RBC: 3.6 MIL/uL — ABNORMAL LOW (ref 4.22–5.81)
RDW: 16.4 % — ABNORMAL HIGH (ref 11.5–15.5)
WBC: 14.2 10*3/uL — ABNORMAL HIGH (ref 4.0–10.5)
nRBC: 4.9 % — ABNORMAL HIGH (ref 0.0–0.2)

## 2018-08-01 LAB — ECHOCARDIOGRAM LIMITED
Height: 70 in
Weight: 3040.58 oz

## 2018-08-01 LAB — BPAM PLATELET PHERESIS
Blood Product Expiration Date: 202006252359
ISSUE DATE / TIME: 202006251735
Unit Type and Rh: 7300

## 2018-08-01 LAB — BLOOD GAS, ARTERIAL
Acid-base deficit: 8.5 mmol/L — ABNORMAL HIGH (ref 0.0–2.0)
Bicarbonate: 15.1 mmol/L — ABNORMAL LOW (ref 20.0–28.0)
Drawn by: 103701
O2 Content: 2 L/min
O2 Saturation: 92.4 %
Patient temperature: 98.6
pCO2 arterial: 26.2 mmHg — ABNORMAL LOW (ref 32.0–48.0)
pH, Arterial: 7.379 (ref 7.350–7.450)
pO2, Arterial: 71.9 mmHg — ABNORMAL LOW (ref 83.0–108.0)

## 2018-08-01 LAB — LUPUS ANTICOAGULANT PANEL
DRVVT: 78.8 s — ABNORMAL HIGH (ref 0.0–47.0)
PTT Lupus Anticoagulant: 75.4 s — ABNORMAL HIGH (ref 0.0–51.9)

## 2018-08-01 LAB — FERRITIN: Ferritin: 2529 ng/mL — ABNORMAL HIGH (ref 24–336)

## 2018-08-01 LAB — PTT-LA MIX: PTT-LA Mix: 72 s — ABNORMAL HIGH (ref 0.0–48.9)

## 2018-08-01 LAB — PREALBUMIN: Prealbumin: <5 mg/dL — ABNORMAL LOW (ref 18–38)

## 2018-08-01 LAB — LACTIC ACID, PLASMA
Lactic Acid, Venous: 6.8 mmol/L (ref 0.5–1.9)
Lactic Acid, Venous: 7.1 mmol/L (ref 0.5–1.9)
Lactic Acid, Venous: 7 mmol/L (ref 0.5–1.9)

## 2018-08-01 LAB — DRVVT CONFIRM: dRVVT Confirm: 1.1 ratio (ref 0.8–1.2)

## 2018-08-01 LAB — DRVVT MIX: dRVVT Mix: 49 s — ABNORMAL HIGH (ref 0.0–47.0)

## 2018-08-01 LAB — HEXAGONAL PHASE PHOSPHOLIPID: Hexagonal Phase Phospholipid: 0 s (ref 0–11)

## 2018-08-01 MED ORDER — SODIUM CHLORIDE 0.9% FLUSH
10.0000 mL | Freq: Two times a day (BID) | INTRAVENOUS | Status: DC
  Administered 2018-08-01: 23:00:00 10 mL
  Administered 2018-08-02: 10:00:00 20 mL
  Administered 2018-08-02 – 2018-08-03 (×3): 10 mL

## 2018-08-01 MED ORDER — SODIUM CHLORIDE 0.9 % IV SOLN
INTRAVENOUS | Status: DC
  Administered 2018-08-01: 14:00:00 via INTRAVENOUS

## 2018-08-01 MED ORDER — SODIUM CHLORIDE 0.9 % IV BOLUS
1000.0000 mL | Freq: Once | INTRAVENOUS | Status: AC
  Administered 2018-08-01: 18:00:00 1000 mL via INTRAVENOUS

## 2018-08-01 MED ORDER — SODIUM CHLORIDE 0.9 % IV BOLUS: 500.0000 mL | Freq: Once | INTRAVENOUS | Status: DC

## 2018-08-01 MED ORDER — SODIUM CHLORIDE 0.9% FLUSH
10.0000 mL | Freq: Two times a day (BID) | INTRAVENOUS | Status: DC
  Administered 2018-08-02: 20 mL
  Administered 2018-08-02 – 2018-08-06 (×7): 10 mL
  Administered 2018-08-06: 30 mL

## 2018-08-01 MED ORDER — SODIUM CHLORIDE 0.9% FLUSH: 10.0000 mL | INTRAVENOUS | Status: DC | PRN

## 2018-08-01 MED ORDER — SODIUM CHLORIDE 0.9 % IV BOLUS
500.0000 mL | Freq: Once | INTRAVENOUS | Status: AC
  Administered 2018-08-01: 21:00:00 500 mL via INTRAVENOUS

## 2018-08-01 MED ORDER — SODIUM CHLORIDE 0.9 % IV BOLUS
500.0000 mL | Freq: Once | INTRAVENOUS | Status: AC
  Administered 2018-08-01: 500 mL via INTRAVENOUS

## 2018-08-01 NOTE — Progress Notes (Signed)
RN spoke with wife and pt. While in the room at 1000.

## 2018-08-01 NOTE — Consult Note (Addendum)
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 Now whether he has [place] even though his echo at acute presentation was minimally abnormal and echo has been done pm 6/26 but is pending at time of PCCM consultation for labile bp and ? intravasc volume status.    HISTORY OF PRESENT ILLNESS   Patient was admitted 6/12-06/16/2020 with shortness of breath and fatigue and was found to have bilateral DVTs and a large PE in the left lung, concern for developing pulmonary infarct distal to the embolus.CT of abdomen and pelvis showed hydronephrosis and aortic atherosclerosis. 2D echo 6/13 was negative for right heart strain, LVEF 55-60%, mildly enlarged RV, IVC >50% respiratory variability. Hypercoaguable work-up 6/12 was notable for decreased protein S and protein C. Started on Xarelto  He was readmitted 6/21 to 6/23 with weakness, confusion, fever, and failure to thrive and he was found to have elevated LFTs with worsening thrombocytopenia.    Main c/o is weakness / confusion - deneis cp/ cough or even sob   SIGNIFICANT PAST MEDICAL HISTORY    HTN, hyperlipidemia, depression, hypercoagulability,  SIGNIFICANT EVENTS:   STUDIES:   Echo cardiogram 6/26   CULTURES:  BC 08/02/2018 >>> [place] virus pcr 07/29/2018 neg  MRSA screening 07/26/2018 neg   ANTIBIOTICS:  rafiamin 6/24   LINES/TUBES:    CONSULTANTS:  Hematology GI   Cardiology 6/26  PCCM   6/26   SUBJECTIVE:  Confused but denies sob /cp / cough  CONSTITUTIONAL: BP 96/70 (BP Location: Left Arm)   Pulse (!) 113   Temp (!) 97.5 F (36.4 C) (Oral)   Resp (!) 30   Ht 5\' 10"  (1.778 m)   Wt 86.2 kg   SpO2 95%   BMI  27.27 kg/m      Intake/Output Summary (Last 24 hours) at 08/01/2018 1737 Last data filed at 08/01/2018 1600 Gross per 24 hour  Intake 653.33 ml  Output 210 ml  Net 443.33 ml          PHYSICAL EXAM: General: chronically > acutely ill  Neuro:  Oriented to person  HEENT:  Neck supple Cardiovascular:  RRR Sinus Tach with pulse 116 no increase P2  Lungs: minimal decrease in bs based  Abdomen:  Mod distended but soft Musculoskeletal:  Wasted  Skin:  No skin breakdown   RESOLVED PROBLEM LIST   ASSESSMENT AND PLAN    1) 02 dep resp failure s/p PE with infarcts but no evidence clinically of TEPAH and echo should sort this out - if he does have RV failure now it is most likely due to ongoing VTE's while on doac suggesting underlying malignancy (with septic emboli much less likely) so agree with Lovenox despite  Low plts as per hematology recs  >>> rx is keep sats high 90s  2) borderline hbp/ tachycardia  - agree with Dr Alba Cory he's likley intravascularly vol depleted related to low albumin so may need volume expansion, not diuresis, pending echo of course (no need for invasive monitoring at this point or certainly not RHC)   3) AMS c/w TME / liver dz > on chronulac per GI  4) Multiple pulmonary nodules  Too small for PET or bx, not suspicious enough or practical at this point to attempt any kind of bx  > really only option for now is follow the Fleischner society guidelines as rec by radiology.      LABS  Glucose Recent Labs  Lab 07/28/18 0635 07/28/2018 1730  GLUCAP 90 103*    BMET Recent Labs  Lab 07/15/2018 1709 07/31/18 0212 08/01/18 0217  NA 127* 129* 133*  K 4.0 4.0 3.8  CL 98 100 104  CO2 22 19* 17*  BUN 22 21 27*  CREATININE 0.78 0.67 0.74  GLUCOSE 102* 89 71    Liver Enzymes Recent Labs  Lab 07/15/2018 1709 07/31/18 0212 08/01/18 0217  AST 111* 101* 102*  ALT 37 34 32  ALKPHOS 239* 203* 178*  BILITOT 1.9* 2.2* 2.6*  ALBUMIN 1.7* 1.7* 1.7*     Electrolytes Recent Labs  Lab 07/27/18 1146  07/29/18 0352 07/09/2018 1709 07/31/18 0212 08/01/18 0217  CALCIUM 9.1   < > 8.0* 8.0* 7.9* 7.7*  MG 2.0  --  1.9  --  2.1  --   PHOS  --   --  2.0*  --  1.7*  --    < > = values in this interval not displayed.    CBC Recent Labs  Lab 07/31/18 0212 07/31/18 2249 08/01/18 0217 08/01/18 1035  WBC 12.0* 13.3* 14.2*  --   HGB 10.0* 9.8* 9.6*  --   HCT 33.3* 31.1* 31.4*  --   PLT 38* 44* 36* 34*    ABG Recent Labs  Lab 07/28/18 1052 08/01/18 1508  PHART 7.472* 7.379  PCO2ART 33.4 26.2*  PO2ART 69.1* 71.9*    Coag's Recent Labs  Lab 07/27/18 1529  07/28/18 1608 07/29/18 0352 07/14/2018 1709 08/01/18 1035  APTT 49*   < > 197* 92*  --  61*  INR 1.7*  --   --   --  1.5* 1.7*   < > = values in this interval not displayed.    Sepsis Markers Recent Labs  Lab 07/31/18 0646 08/01/18 1310 08/01/18 1535  LATICACIDVEN 2.2* 6.8* 7.1*    Cardiac Enzymes No results for input(s): TROPONINI, PROBNP in the last 168 hours.  PAST MEDICAL HISTORY :   He  has a past medical history of Ankle fracture (2006), Arthritis, Depression, Hyperlipidemia, Hypertension, Pulmonary embolism ([place]), and Vitamin D deficiency.  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  Allergies  Allergen Reactions  . Cephalexin Nausea Only  . Feldene [Piroxicam] Hives, Nausea Only and Other (See Comments)    Makes pt feel loopy    No current facility-administered medications on file prior to encounter.    Current Outpatient Medications on File Prior to Encounter  Medication Sig  . buPROPion (WELLBUTRIN XL) 300 MG 24 hr tablet TAKE 1 TABLET BY MOUTH EVERY DAY  . camphor-menthol (SARNA) lotion Apply 1 application topically 2 (two) times a day.  . Cholecalciferol (VITAMIN D3) 250 MCG (10000 UT) capsule Take 10,000 Units  by mouth daily.  . feeding supplement, ENSURE ENLIVE, (ENSURE ENLIVE) LIQD Take 237 mLs by mouth daily.  . Multiple Vitamin (MULTIVITAMIN) tablet Take 1 tablet by mouth daily.    . Rivaroxaban 15 & 20 MG TBPK Take as directed on package: Start with one 15mg  tablet by mouth twice a day with food. On Day 22,  switch to one 20mg  tablet once a day with food. (Patient taking differently: Take 15-20 mg by mouth See admin instructions. Take as directed on package: Start with 15 mg by mouth twice daily with food. On Day 22, switch to 20 mg daily with food.)  . traMADol (ULTRAM) 50 MG tablet Take 1 tablet (50 mg total) by mouth every 6 (six) hours as needed. (Patient taking differently: Take 50 mg by mouth every 6 (six) hours as needed for moderate pain. )  . senna (SENOKOT) 8.6 MG TABS tablet Take 1 tablet (8.6 mg total) by mouth at bedtime. (Patient not taking: Reported on 07/26/2018)    FAMILY HISTORY:   His family history includes Aortic aneurysm in his mother; Heart disease in his father.  SOCIAL HISTORY:  He  reports that he quit smoking about 31 years ago. His smoking use included cigarettes. He smoked 1.00 pack per day. He has never used smokeless tobacco. He reports current alcohol use. He reports that he does not use drugs.     Christinia Gully, MD Pulmonary and [place] [phone] After 5:30 PM or weekends, use [facility] [phone]

## 2018-08-01 NOTE — Progress Notes (Signed)
CRITICAL VALUE ALERT  Critical Value:  LA 6.8  Date & Time Notied:  08/01/18 1400  Provider Notified: Lonny Prude  Orders Received/Actions taken: Awaiting Echo results NS fluids started.

## 2018-08-01 NOTE — Progress Notes (Signed)
HEMATOLOGY-ONCOLOGY PROGRESS NOTE  SUBJECTIVE: Remains confused. Breathing better. No chest pain. Still with LE edema. No bleeding.   REVIEW OF SYSTEMS: A comprehensive review of systems could not be obtained secondary to patient confusion.  I have reviewed the past medical history, past surgical history, social history and family history with the patient and they are unchanged from previous note.   PHYSICAL EXAMINATION: ECOG PERFORMANCE STATUS: 4 - Bedbound  Vitals:   08/01/18 0900 08/01/18 1000  BP: (!) 111/56 107/62  Pulse: (!) 117 (!) 118  Resp: (!) 24 (!) 30  Temp:    SpO2: 98% 96%   Filed Weights   07/14/2018 1646 07/31/18 0418 08/01/18 0500  Weight: 181 lb (82.1 kg) 186 lb 15.2 oz (84.8 kg) 190 lb 0.6 oz (86.2 kg)    Intake/Output from previous day: 06/25 0701 - 06/26 0700 In: -  Out: 210 [Urine:210]  GENERAL:alert, no distress LUNGS: Decreased breath sounds right lower lobe HEART: Tachycardic, no murmurs, edematous ABDOMEN:abdomen soft, non-tender and normal bowel sounds Musculoskeletal:no cyanosis of digits and no clubbing  NEURO: Confused, no focal motor/sensory deficits  LABORATORY DATA:  I have reviewed the data as listed CMP Latest Ref Rng & Units 08/01/2018 07/31/2018 07/09/2018  Glucose 70 - 99 mg/dL 71 89 102(H)  BUN 8 - 23 mg/dL 27(H) 21 22  Creatinine 0.61 - 1.24 mg/dL 0.74 0.67 0.78  Sodium 135 - 145 mmol/L 133(L) 129(L) 127(L)  Potassium 3.5 - 5.1 mmol/L 3.8 4.0 4.0  Chloride 98 - 111 mmol/L 104 100 98  CO2 22 - 32 mmol/L 17(L) 19(L) 22  Calcium 8.9 - 10.3 mg/dL 7.7(L) 7.9(L) 8.0(L)  Total Protein 6.5 - 8.1 g/dL 4.8(L) 5.0(L) 5.4(L)  Total Bilirubin 0.3 - 1.2 mg/dL 2.6(H) 2.2(H) 1.9(H)  Alkaline Phos 38 - 126 U/L 178(H) 203(H) 239(H)  AST 15 - 41 U/L 102(H) 101(H) 111(H)  ALT 0 - 44 U/L 32 34 37    Lab Results  Component Value Date   WBC 14.2 (H) 08/01/2018   HGB 9.6 (L) 08/01/2018   HCT 31.4 (L) 08/01/2018   MCV 87.2 08/01/2018   PLT 34 (L)  08/01/2018   NEUTROABS 3.8 08/01/2018    Dg Chest 2 View  Result Date: 07/27/2018 CLINICAL DATA:  Hypoxia and altered mental status. EXAM: CHEST - 2 VIEW COMPARISON:  07/20/2018 and 07/04/2018 FINDINGS: Lungs are adequately inflated without focal lobar consolidation or effusion. Cardiomediastinal silhouette and remainder the exam is unchanged. IMPRESSION: No active cardiopulmonary disease. Electronically Signed   By: Marin Olp M.D.   On: 07/27/2018 12:31   Dg Chest 2 View  Result Date: 07/04/2018 CLINICAL DATA:  Productive cough, shortness of breath EXAM: CHEST - 2 VIEW COMPARISON:  04/16/2014 FINDINGS: Heart and mediastinal contours are within normal limits. No focal opacities or effusions. No acute bony abnormality. IMPRESSION: No active cardiopulmonary disease. Electronically Signed   By: Rolm Baptise M.D.   On: 07/04/2018 11:45   Ct Head Wo Contrast  Result Date: 07/24/2018 CLINICAL DATA:  78 year old male with altered mental status. EXAM: CT HEAD WITHOUT CONTRAST TECHNIQUE: Contiguous axial images were obtained from the base of the skull through the vertex without intravenous contrast. COMPARISON:  None. FINDINGS: Brain: The ventricles and sulci appropriate size for patient's age. Mild periventricular and deep white matter chronic microvascular ischemic changes noted. There is no acute intracranial hemorrhage. No mass effect or midline shift. No extra-axial fluid collection. Vascular: No hyperdense vessel or unexpected calcification. Skull: Normal. Negative for fracture or  focal lesion. Sinuses/Orbits: No acute finding. Other: None IMPRESSION: 1. No acute intracranial hemorrhage. 2. Mild chronic microvascular ischemic changes. Electronically Signed   By: Anner Crete M.D.   On: 07/19/2018 20:30   Ct Angio Chest Pe W Or Wo Contrast  Result Date: 07/27/2018 CLINICAL DATA:  Dyspnea. History of pulmonary embolus. EXAM: CT ANGIOGRAPHY CHEST WITH CONTRAST TECHNIQUE: Multidetector CT imaging  of the chest was performed using the standard protocol during bolus administration of intravenous contrast. Multiplanar CT image reconstructions and MIPs were obtained to evaluate the vascular anatomy. CONTRAST:  151mL OMNIPAQUE IOHEXOL 350 MG/ML SOLN COMPARISON:  07/18/2018 FINDINGS: Cardiovascular: Again seen is nearly occlusive pulmonary embolus within a basilar segment of the left lower lobe with a similar clot burden. RV/LV ratio equals 1. There is a small pericardial effusion with maximum thickness of 7 mm at the base of the heart. Mediastinum/Nodes: Borderline enlarged mediastinal lymph nodes have increased since the last exam, and measure up to 13 mm in short axis. Normal appearance of the thyroid gland and esophagus. Lungs/Pleura: Peripheral opacity in the left lung base, distal to the pulmonary embolus may represent a subpleural pulmonary mass or an area of developing pulmonary infarction. Minimal left pleural effusion. There are several right-sided pulmonary nodules. Mainly, there is a 9 mm pulmonary nodule in the right lower lobe, image 89/157, sequence 10. Smaller more anterior 5 mm pulmonary nodule is also seen in the right lower lobe, image 88/157, sequence 10. A 5 mm perifissural pulmonary nodule is seen in the right middle lobe, image 82/157, sequence 10. Upper Abdomen: 1.9 cm water density left hepatic lobe mass likely represents a cyst. Musculoskeletal: Multilevel spondylosis of the thoracic spine. Review of the MIP images confirms the above findings. IMPRESSION: 1. Known nearly occlusive pulmonary embolus within a basilar segment of the left lower lobar pulmonary artery with similar clot burden. 2. Peripheral opacity in the left lung base, distal to the embolus, may represent a pulmonary mass or developing pulmonary infarction. 3. Several right-sided pulmonary nodules, the largest of which measures 9 mm. Non-contrast chest CT at 3-6 months is recommended. If the nodules are stable at time of  repeat CT, then future CT at 18-24 months (from today's scan) is considered optional for low-risk patients, but is recommended for high-risk patients. This recommendation follows the consensus statement: Guidelines for Management of Incidental Pulmonary Nodules Detected on CT Images: From the Fleischner Society 2017; Radiology 2017; 284:228-243. Aortic Atherosclerosis (ICD10-I70.0). Electronically Signed   By: Fidela Salisbury M.D.   On: 07/27/2018 18:05   Ct Angio Chest Pe W And/or Wo Contrast  Result Date: 07/18/2018 CLINICAL DATA:  Leg weakness with exertion EXAM: CT ANGIOGRAPHY CHEST WITH CONTRAST TECHNIQUE: Multidetector CT imaging of the chest was performed using the standard protocol during bolus administration of intravenous contrast. Multiplanar CT image reconstructions and MIPs were obtained to evaluate the vascular anatomy. CONTRAST:  193mL OMNIPAQUE IOHEXOL 350 MG/ML SOLN COMPARISON:  11/17/2009 FINDINGS: Cardiovascular: Examination for pulmonary embolism is limited by patient and breath motion artifact throughout. Within this limitation, positive examination for pulmonary embolism, with segmental embolus present in the left lung base (series 6, image 53). There is no definite embolus identified elsewhere. The RV:LV ratio is enlarged, measuring up to 1.5:1. Left coronary artery calcifications. No pericardial effusion. Mediastinum/Nodes: No enlarged mediastinal, hilar, or axillary lymph nodes. Thyroid gland, trachea, and esophagus demonstrate no significant findings. Lungs/Pleura: There is a subpleural heterogeneous opacity of the left lung base distal to embolus, concerning for  developing infarction. No pleural effusion or pneumothorax. Upper Abdomen: No acute abnormality. Musculoskeletal: No chest wall abnormality. No acute or significant osseous findings. Review of the MIP images confirms the above findings. IMPRESSION: 1. Examination for pulmonary embolism is limited by patient and breath  motion artifact throughout. Within this limitation, positive examination for pulmonary embolism, with segmental embolus present in the left lung base (series 6, image 53). There is no definite embolus identified elsewhere. 2. There is a subpleural heterogeneous opacity of the left lung base distal to embolus, concerning for developing infarction. 3. The RV:LV ratio is enlarged, measuring up to 1.5:1. This is concerning for right heart strain by criteria in the setting of pulmonary embolism, however there is a relatively small, distal burden of embolus. Correlate with clinical concern for right heart strain and echocardiogram. 4.  Coronary artery disease. These results were called by telephone at the time of interpretation on 07/18/2018 at 3:33 pm to Dr. Virgel Manifold , who verbally acknowledged these results. Electronically Signed   By: Eddie Candle M.D.   On: 07/18/2018 15:36   Ct Abdomen Pelvis W Contrast  Result Date: 07/25/2018 CLINICAL DATA:  Increasing weakness and altered mental status. Recent hospitalization for pulmonary embolism. EXAM: CT ABDOMEN AND PELVIS WITH CONTRAST TECHNIQUE: Multidetector CT imaging of the abdomen and pelvis was performed using the standard protocol following bolus administration of intravenous contrast. CONTRAST:  168mL OMNIPAQUE IOHEXOL 300 MG/ML  SOLN COMPARISON:  Abdominopelvic CT 07/18/2018.  Ultrasound 07/27/2018. FINDINGS: Lower chest: There are small bilateral pleural effusions and mild bibasilar atelectasis. There is a small pericardial effusion. Hepatobiliary: There is a stable cyst in the left hepatic lobe. No suspicious hepatic findings. No evidence of gallstones, gallbladder wall thickening or biliary dilatation. Pancreas: Unremarkable. No pancreatic ductal dilatation or surrounding inflammation. Spleen: Progressive splenomegaly. The spleen measures 16.9 x 11.3 x 17.7 cm (volume = 1770 cm^3) and demonstrates peripheral wedge shaped areas of decreased attenuation  suspicious for small splenic infarcts. There is no perisplenic fluid collection. Adrenals/Urinary Tract: The adrenal glands appear stable. 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 There is stable mild fullness of the left adrenal gland. There is a nonobstructing calculus in the upper pole of the left kidney and bilateral renal sinus cyst formation. Proximal left ureteral calculus measuring 5 mm on image 55/2 is unchanged in position. There is no significant hydronephrosis or delay in contrast excretion. Mild bladder wall thickening, likely due to incomplete distension. Stomach/Bowel: No evidence of bowel wall thickening, distention or surrounding inflammatory change. The appendix is not definitely visualized. There are mild diverticular changes of the distal colon. Vascular/Lymphatic: Mildly prominent lymph nodes within the gastrohepatic ligament and porta hepatis are stable. There is no retroperitoneal adenopathy. There is diffuse aortic and branch vessel atherosclerosis without acute vascular findings. Reproductive: Stable mild enlargement of the prostate gland with dystrophic calcifications. Other: There is diffuse edema throughout the subcutaneous and intra-abdominal fat, including the perirectal fat. There is a small amount of pelvic ascites. No focal extraluminal fluid collection or pneumoperitoneum. Musculoskeletal: No acute or significant osseous findings. There are diffuse degenerative changes throughout the lumbar spine. Patient is status post left total hip arthroplasty. Moderate right hip degenerative changes are present. IMPRESSION: 1. Progressive splenomegaly with wedge-shaped areas of decreased attenuation peripherally suspicious for subacute splenic infarcts. 2. Unchanged position of proximal left ureteral calculus compared with CT of 12 days ago. No significant hydronephrosis or delay in contrast excretion. 3. Interval development  of diffuse soft  tissue edema, ascites and bilateral pleural effusions consistent with anasarca. 4.  Aortic Atherosclerosis (ICD10-I70.0). Electronically Signed   By: Richardean Sale M.D.   On: 07/16/2018 21:01   Ct Abdomen Pelvis W Contrast  Result Date: 07/18/2018 CLINICAL DATA:  Painless jaundice, weight loss. EXAM: CT ABDOMEN AND PELVIS WITH CONTRAST TECHNIQUE: Multidetector CT imaging of the abdomen and pelvis was performed using the standard protocol following bolus administration of intravenous contrast. CONTRAST:  173mL OMNIPAQUE IOHEXOL 350 MG/ML SOLN COMPARISON:  CT scan of November 17, 2009. FINDINGS: Lower chest: No acute abnormality. Hepatobiliary: No cholelithiasis or biliary dilatation is noted. Simple left hepatic cyst is noted. Pancreas: Unremarkable. No pancreatic ductal dilatation or surrounding inflammatory changes. Spleen: Normal in size without focal abnormality. Adrenals/Urinary Tract: Left adrenal gland appears normal. Stable calcified right adrenal myelolipoma is noted. Small nonobstructive calculus seen in upper pole calyx of left kidney. Mild left hydronephrosis is noted secondary to 7 mm calculus in proximal left ureter. Urinary bladder is unremarkable. Stomach/Bowel: The stomach appears normal. There is no evidence of bowel obstruction or inflammation. The appendix is not visualized. Vascular/Lymphatic: Aortic atherosclerosis. No enlarged abdominal or pelvic lymph nodes. Reproductive: Mild prostatic enlargement is noted. Other: No abdominal wall hernia or abnormality. No abdominopelvic ascites. Musculoskeletal: Status post left total hip arthroplasty. No acute osseous abnormality is noted. IMPRESSION: Mild left hydronephrosis is noted secondary to 7 mm proximal left ureteral calculus. Stable partially calcified right adrenal myelolipoma is noted. Mild prostatic enlargement. Aortic Atherosclerosis (ICD10-I70.0). Electronically Signed   By: Marijo Conception M.D.   On: 07/18/2018 15:51   US  Renal  Result Date: 07/27/2018 CLINICAL DATA:  Hematuria EXAM: RENAL / URINARY TRACT ULTRASOUND COMPLETE COMPARISON:  CT 07/18/2018 FINDINGS: Right Kidney: Renal measurements: 12.2 x 5.6 x 6.2 cm = volume: 219.4 mL . Echogenicity within normal limits. No mass or hydronephrosis visualized. Left Kidney: Renal measurements: 13.8 x 5.7 x 6.7 cm = volume: 276 mL. Cortical echogenicity within normal limits. No hydronephrosis. Left parapelvic cyst measuring up to 11 mm in the lower pole. Bladder: Appears normal for degree of bladder distention. Mild debris in the urinary bladder IMPRESSION: 1. Negative for hydronephrosis or shadowing stone. Small cyst in the left kidney 2. Mild debris in the urinary bladder Electronically Signed   By: Donavan Foil M.D.   On: 07/27/2018 17:19   Dg Chest Port 1 View  Result Date: 07/18/2018 CLINICAL DATA:  Altered mental status EXAM: PORTABLE CHEST 1 VIEW COMPARISON:  Chest radiograph and chest CT July 27, 2018 FINDINGS: There is mild atelectasis in the left base with minimal left pleural effusion. Lungs elsewhere clear. Heart is upper normal in size with pulmonary vascularity normal. No adenopathy. No bone lesions. IMPRESSION: Mild left base atelectasis with minimal left pleural effusion. Lungs elsewhere clear. Stable cardiac silhouette. Electronically Signed   By: Lowella Grip III M.D.   On: 07/28/2018 19:00   Dg Chest Port 1 View  Result Date: 07/20/2018 CLINICAL DATA:  Shortness of breath EXAM: PORTABLE CHEST 1 VIEW COMPARISON:  Jul 04, 2018 FINDINGS: The heart, hila, and mediastinum are normal. No pneumothorax. No nodules or masses. A small left pleural effusion is not excluded on this study. A PA and lateral chest x-ray could better evaluate. IMPRESSION: Question of a small left pleural effusion. A PA and lateral chest x-ray could better evaluate if clinically warranted. Electronically Signed   By: Dorise Bullion III M.D   On: 07/20/2018 08:53  US Liver  Doppler  Result Date: 07/28/2018 CLINICAL DATA:  Elevated LFTs EXAM: DUPLEX ULTRASOUND OF LIVER TECHNIQUE: Color and duplex Doppler ultrasound was performed to evaluate the hepatic in-flow and out-flow vessels. COMPARISON:  07/27/2018 FINDINGS: Portal Vein Velocities Main:  25 cm/sec Right:  28 cm/sec Left:  17 cm/sec Hepatic Vein Velocities Right:  98 cm/sec Middle:  44 cm/sec Left:  38 cm/sec Hepatic Artery Velocity:  112 cm/sec Splenic Vein Velocity:  19 cm/sec Varices: Not visualized Ascites: Visualized Patent portal, hepatic and splenic veins with normal directional flow. Negative for portal vein occlusion or thrombus. IMPRESSION: Normal hepatic venous Doppler. Electronically Signed   By: Jerilynn Mages.  Shick M.D.   On: 07/28/2018 20:47   Vas [place] Lower Extremity Venous (dvt)  Result Date: 07/20/2018  Lower Venous Study Indications: Pulmonary embolism.  Comparison Study: No prior study Performing Technologist: Maudry Mayhew MHA, RDMS, RVT, RDCS  Examination Guidelines: A complete evaluation includes B-mode imaging, spectral Doppler, color Doppler, and power Doppler as needed of all accessible portions of each vessel. Bilateral testing is considered an integral part of a complete examination. Limited examinations for reoccurring indications may be performed as noted.  +---------+---------------+---------+-----------+----------+--------------+ RIGHT    CompressibilityPhasicitySpontaneityPropertiesSummary        +---------+---------------+---------+-----------+----------+--------------+ CFV      Full           No       Yes                  Pulsatile flow +---------+---------------+---------+-----------+----------+--------------+ SFJ      Full                                                        +---------+---------------+---------+-----------+----------+--------------+ FV Prox  None                    No                   Acute           +---------+---------------+---------+-----------+----------+--------------+ FV Mid   Full                                                        +---------+---------------+---------+-----------+----------+--------------+ FV DistalFull                                                        +---------+---------------+---------+-----------+----------+--------------+ PFV      None                    No                   Acute          +---------+---------------+---------+-----------+----------+--------------+ POP      Full           No       Yes                  Pulsatile flow +---------+---------------+---------+-----------+----------+--------------+ PTV  Full                                                        +---------+---------------+---------+-----------+----------+--------------+ PERO     Full                                                        +---------+---------------+---------+-----------+----------+--------------+   +---------+---------------+---------+-----------+----------+--------------+ LEFT     CompressibilityPhasicitySpontaneityPropertiesSummary        +---------+---------------+---------+-----------+----------+--------------+ CFV      Full           No       Yes                  Pulsatile flow +---------+---------------+---------+-----------+----------+--------------+ SFJ      Full                                                        +---------+---------------+---------+-----------+----------+--------------+ FV Prox  None                    No                   Acute          +---------+---------------+---------+-----------+----------+--------------+ FV Mid   Full                                                        +---------+---------------+---------+-----------+----------+--------------+ FV DistalNone                    No                   Acute           +---------+---------------+---------+-----------+----------+--------------+ PFV      Full                                                        +---------+---------------+---------+-----------+----------+--------------+ POP      Full                                         Pulsatile flow +---------+---------------+---------+-----------+----------+--------------+ PTV      Full                                                        +---------+---------------+---------+-----------+----------+--------------+ PERO     Full                                                        +---------+---------------+---------+-----------+----------+--------------+  Gastroc  None                    No                   Acute          +---------+---------------+---------+-----------+----------+--------------+     Summary: Right: Findings consistent with acute deep vein thrombosis involving the right femoral vein, and right proximal profunda vein. No cystic structure found in the popliteal fossa. Left: Findings consistent with acute deep vein thrombosis involving the left femoral vein, and left gastrocnemius vein. No cystic structure found in the popliteal fossa.  Venous flow is pulsatile bilaterally, suggestive of possibly elevated right-sided heart pressure. *See table(s) above for measurements and observations. Electronically signed by Harold Barban MD on 07/20/2018 at 4:35:55 PM.    Final    US Abdomen Limited Ruq  Result Date: 07/27/2018 CLINICAL DATA:  Elevated LFT EXAM: ULTRASOUND ABDOMEN LIMITED RIGHT UPPER QUADRANT COMPARISON:  CT 07/18/2018 FINDINGS: Gallbladder: Moderate sludge. No shadowing stone. Normal wall thickness. Negative sonographic Murphy. Common bile duct: Diameter: 3 mm Liver: Increased hepatic echogenicity. 2.2 cm cyst in the left hepatic lobe. Portal vein is patent on color Doppler imaging with normal direction of blood flow towards the liver. IMPRESSION: 1. Gallbladder  sludge without sonographic evidence for acute cholecystitis 2. Echogenic liver suggesting steatosis and or hepatocellular disease. Cyst in the left hepatic lobe Electronically Signed   By: Donavan Foil M.D.   On: 07/27/2018 17:21    ASSESSMENT AND PLAN: 1. Severe Thrombocytopenia: Suspect increased consumption rather than decreased production.  -Immature platelet fraction - Smear: No schistocytes -Status post 1 unit of platelets on 07/31/2018 with initial bump from 38,000 up to 44,000.  Now down to 36,000 this morning.  He has no bleeding. -We will discuss with Dr. Lindi Adie need for platelet transfusion today secondary to anticoagulation.   2. PE : suspect that its causing the CHF. On Lovenox. HIT Ab Neg. Cardiology consult pending. 3. Lupus Anticoagulant not detected.  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 4. Elevated LFTs and Ammonia.  Will follow closely.    LOS: 2 days   Luis Bussing, DNP, AGPCNP-BC, AOCNP 08/01/18

## 2018-08-01 NOTE — Progress Notes (Signed)
ANTICOAGULATION CONSULT NOTE - Initial Consult  Pharmacy Consult for Lovenox Indication: pulmonary embolus  Allergies  Allergen Reactions  . Cephalexin Nausea Only  . Feldene [Piroxicam] Hives, Nausea Only and Other (See Comments)    Makes pt feel loopy    Patient Measurements: Height: 5\' 10"  (177.8 cm) Weight: 190 lb 0.6 oz (86.2 kg) IBW/kg (Calculated) : 73   Vital Signs: Temp: 98.2 F (36.8 C) (06/26 0400) Temp Source: Oral (06/26 0400) BP: 118/59 (06/26 0000) Pulse Rate: 115 (06/26 0000)  Labs: Recent Labs    07/29/18 1254  07/18/2018 1709 07/31/18 0212 07/31/18 2249 08/01/18 0217  HGB  --    < > 10.6* 10.0* 9.8* 9.6*  HCT  --    < > 33.6* 33.3* 31.1* 31.4*  PLT  --    < > 45* 38* 44* 36*  LABPROT  --   --  17.9*  --   --   --   INR  --   --  1.5*  --   --   --   HEPARINUNFRC 0.42  --   --   --   --   --   CREATININE  --   --  0.78 0.67  --  0.74   < > = values in this interval not displayed.    Estimated Creatinine Clearance: 78.6 mL/min (by C-G formula based on SCr of 0.74 mg/dL).   Medical History: Past Medical History:  Diagnosis Date  . Ankle fracture 2006   right  . Arthritis   . Depression   . Hyperlipidemia   . Hypertension   . Pulmonary embolism ([place])   . Vitamin D deficiency     Medications:  Scheduled:  . buPROPion  300 mg Oral Daily  . camphor-menthol   Topical BID  . Chlorhexidine Gluconate Cloth  6 each Topical Daily  . enoxaparin (LOVENOX) injection  80 mg Subcutaneous Q12H  . lactulose  10 g Oral TID  . mouth rinse  15 mL Mouth Rinse BID  . senna-docusate  1 tablet Oral BID   Infusions:    Assessment: 17 yoM recently [facility] now with weakness and lethargy. On PTA xarelto for recent PE. Switching to Lovenox per Hematology recommendations with Pharmacy to dose.   CBC: Hgb continues to slowly drift down, no transfusion required yet; Plt < 50 - per Hematology suspect consumptive process; HIT Ab negative  Lupus  anticoag pending  SCr: > 60 ml/min  Previous anticoagulation: Xarelto 15 mg bid, LD 6/24 AM   Goal of Therapy: Anti-Xa level 0.6-1 units/ml 4hrs after LMWH dose given  Plan:  Continue Lovenox 80 mg SQ q12 hr  Daily CBC ordered through 6/30; weekly SCr  Monitor for signs of bleeding or worsening thrombosis   Reuel Boom, PharmD, BCPS [phone] 08/01/2018, 8:38 AM

## 2018-08-01 NOTE — Progress Notes (Signed)
Informed pt wife that he had a bowel mvt.  Due to she had been very concerned about pt last BM had been a while.

## 2018-08-01 NOTE — Progress Notes (Signed)
RN spoke with pt wife and pt in the room at 2300.

## 2018-08-01 NOTE — Consult Note (Addendum)
Cardiology Consultation:   Patient ID: [name] MRN: [id number]; DOB: 10-Dec-1940  Admit date: 07/15/2018 Date of Consult: 08/01/2018  Primary Care Provider: Elby Showers, MD Primary Cardiologist: No primary care provider on file. new, Dr Percival Spanish Primary Electrophysiologist:  None    Patient Profile:   Luis Cisneros is a 78 y.o. male with a h/o of bilateral PE in 2006 post surgery leg fx of HTN, hyperlipidemia, depression, hypercoagulability, recently diagnosed Pulmonary embolism and bilateral lower extremity DVTs on Xarelto who is being seen today for the evaluation of possible right sided heart failure secondary to Pulmonary Embolus at the request of Dr. Lonny Prude  History of Present Illness:   Mr. Swetz has no cardiac history. Patient was admitted 6/12-06/16/2020 with shortness of breath and fatigue and was found to have bilateral DVTs and a large PE in the left lung, concern for developing pulmonary infarct distal to the embolus.CT of abdomen and pelvis showed hydronephrosis and aortic atherosclerosis. 2D echo 6/13 was negative for right heart strain, LVEF 55-60%, mildly enlarged RV, IVC >50% respiratory variability. Hypercoaguable work-up 6/12 was notable for decreased protein S and protein C. Started on Xarelto  He was readmitted 6/21 to 6/23 with weakness, confusion, fever, and failure to thrive and he was found to have elevated LFTs with worsening thrombocytopenia. Patient was seen by hematology. Abdominal US 6/21 noted increased hepatic echogenicity, small cyst, gallbladder sludge suggesting steatosis and/or hepatocellular disease. Follow up US liver Doppler showed normal hepatic veins.Repeat CT angio 6/21 showed nearly occlusive pulmonary embolus within a basilar segment on the left lower lobe, small pericardial effusion, peripheral opacity in the left lung base distal to the embolus, and possible pulmonary infarct, and several right sided pulmonary nodules (Non-contrast CT  recommended 3-6 months). He was treated with supportive care and discharged 6/23 still on Xarelto.    His wife brought patient back to the ER 6/24 for weakness and encephalopathy. She says patient fell trying to get out of the car. Wife reported increasing swelling of the abdomen and legs, poor apetite. Patient started on IVF and lovenox and was admitted. CT head was negative with microvascular ischemia. CXR revealed left bibasilar atelectasis and minimal left effusion. Repeat CT abdomen showed small bilateral pleural effusions, small pericardial effusion, diffuse soft tissue edema and ascitics. Patient was seen by GI who suspects cardiac etiology. Thrombocytopenia likely due to congestive splenomegaly and abnormal LFTs likely due to hepatomegaly. Seen by hematology, recommended platelet transfusion. HIT ab negative. TTE was ordered to assess RV function.   BNP stable 33.8. BP on admission was lower but improved with 2L fluid. B/Ps have been in the 120s/80s. Electrolytes improved. Troponin from 6/13 0.03. Patient denied chest pain or sob. COVID-19 r/o 2 day test performed, awaiting results.  Patient still confused with place and time. States his breathing is better today. Spoke to patients wife on the phone. She denies any significant cardiac history. PCP was managing HTN with amlodipine and hyperlipidemia with simvastatin x 10 years previous to admission. She says before pulmonary embolism patient was very active. Since first admission she noticed general decline and failure to thrive. She has not been able to see patient since Wednesday 6/24. She denies any history of leg edema. Patient quit smoking 30 years ago.   Heart Pathway Score:     Past Medical History:  Diagnosis Date   Ankle fracture 2006   right   Arthritis    Depression    Hyperlipidemia    Hypertension  Pulmonary embolism ([place])    Vitamin D deficiency     Past Surgical History:  Procedure Laterality Date    TONSILLECTOMY     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   dr Mayer Camel   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   Procedure: TOTAL HIP ARTHROPLASTY;  Surgeon: Frederik Pear, MD;  Location: [place];  Service: Orthopedics;  Laterality: Left;     Home Medications:  Prior to Admission medications   Medication Sig Start Date End Date Taking? Authorizing Provider  buPROPion (WELLBUTRIN XL) 300 MG 24 hr tablet TAKE 1 TABLET BY MOUTH EVERY DAY 07/28/18  Yes Baxley, Cresenciano Lick, MD  camphor-menthol [facility]) lotion Apply 1 application topically 2 (two) times a day.   Yes [provider]  Cholecalciferol (VITAMIN D3) 250 MCG (10000 UT) capsule Take 10,000 Units by mouth daily.   Yes [provider]  feeding supplement, ENSURE ENLIVE, (ENSURE ENLIVE) LIQD Take 237 mLs by mouth daily. 07/29/18  Yes Sheikh, Omair Latif, DO  Multiple Vitamin (MULTIVITAMIN) tablet Take 1 tablet by mouth daily.     Yes [provider]  Rivaroxaban 15 & 20 MG TBPK Take as directed on package: Start with one 15mg  tablet by mouth twice a day with food. On Day 22, switch to one 20mg  tablet once a day with food. Patient taking differently: Take 15-20 mg by mouth See admin instructions. Take as directed on package: Start with 15 mg by mouth twice daily with food. On Day 22, switch to 20 mg daily with food. 07/22/18  Yes Eugenie Filler, MD  traMADol (ULTRAM) 50 MG tablet Take 1 tablet (50 mg total) by mouth every 6 (six) hours as needed. Patient taking differently: Take 50 mg by mouth every 6 (six) hours as needed for moderate pain.  12/19/17  Yes Stefanie Libel, MD  senna (SENOKOT) 8.6 MG TABS tablet Take 1 tablet (8.6 mg total) by mouth at bedtime. Patient not taking: Reported on 07/17/2018 07/22/18   Eugenie Filler, MD    Inpatient Medications: Scheduled Meds:  buPROPion  300 mg Oral Daily   camphor-menthol   Topical BID   Chlorhexidine Gluconate Cloth  6 each Topical Daily   enoxaparin (LOVENOX) injection   80 mg Subcutaneous Q12H   lactulose  10 g Oral TID   mouth rinse  15 mL Mouth Rinse BID   senna-docusate  1 tablet Oral BID   Continuous Infusions:  PRN Meds: acetaminophen **OR** acetaminophen, ondansetron **OR** ondansetron (ZOFRAN) IV  Allergies:    Allergies  Allergen Reactions   Cephalexin Nausea Only   Feldene [Piroxicam] Hives, Nausea Only and Other (See Comments)    Makes pt feel loopy    Social History:   Social History   Socioeconomic History   Marital status: Married    Spouse name: Not on file   Number of children: Not on file   Years of education: Not on file   Highest education level: Not on file  Occupational History   Not on file  Social Needs   Financial resource strain: Not on file   Food insecurity    Worry: Not on file    Inability: Not on file   Transportation needs    Medical: Not on file    Non-medical: Not on file  Tobacco Use   Smoking status: Former Smoker    Packs/day: 1.00    Types: Cigarettes    Quit date: 02/06/1987    Years since quitting: 31.5   Smokeless tobacco:  Never Used  Substance and Sexual Activity   Alcohol use: Yes    Comment: 1 drink  4-5 days a week   Drug use: No   Sexual activity: Not on file  Lifestyle   Physical activity    Days per week: Not on file    Minutes per session: Not on file   Stress: Not on file  Relationships   Social connections    Talks on phone: Not on file    Gets together: Not on file    Attends religious service: Not on file    Active member of club or organization: Not on file    Attends meetings of clubs or organizations: Not on file    Relationship status: Not on file   Intimate partner violence    Fear of current or ex partner: Not on file    Emotionally abused: Not on file    Physically abused: Not on file    Forced sexual activity: Not on file  Other Topics Concern   Not on file  Social History Narrative   Not on file    Family History:   Family  History  Problem Relation Age of Onset   Aortic aneurysm Mother    Heart disease Father      ROS:  Please see the history of present illness.  All other ROS reviewed and negative.     Physical Exam/Data:   Vitals:   07/31/18 2300 08/01/18 0000 08/01/18 0400 08/01/18 0500  BP: (!) 100/48 (!) 118/59    Pulse: (!) 112 (!) 115    Resp:      Temp:  98.5 F (36.9 C) 98.2 F (36.8 C)   TempSrc:  Oral Oral   SpO2: 95% 90%    Weight:    86.2 kg  Height:        Intake/Output Summary (Last 24 hours) at 08/01/2018 0740 Last data filed at 07/31/2018 1751 Gross per 24 hour  Intake --  Output 210 ml  Net -210 ml   Last 3 Weights 08/01/2018 07/31/2018 07/31/2018  Weight (lbs) 190 lb 0.6 oz 186 lb 15.2 oz 181 lb  Weight (kg) 86.2 kg 84.8 kg 82.101 kg     Body mass index is 27.27 kg/m.  General:  Well nourished, well developed, in no acute distress; unable to answer questions fully;  HEENT: normal Lymph: no adenopathy Neck: mild JVD Endocrine:  No thryomegaly Vascular: No carotid bruits; FA pulses 2+ bilaterally without bruits  Cardiac:  normal S1, S2; tachycardic; no obvious murmur  Lungs: 2L RA, Decreased breath sounds RLL, tachypnea, no wheezing, rhonchi or rales  Abd: soft, no hepatomegaly, distended Ext: bilateral edema 1+ Musculoskeletal:  No deformities, BUE and BLE strength normal and equal Skin: warm and dry  Neuro: confused to time and place; CNs 2-12 intact Psych:  Normal affect   EKG:  The EKG was personally reviewed and demonstrates:  Sinus Tachycardia, HR 117, compared to EKG 2017 low voltage QRS Telemetry:  Telemetry was personally reviewed and demonstrates:  Sinus tahycardia, HR 120s  Relevant CV Studies: 06/26 Echo to assess RV ordered 6/13 Echo 1. The left ventricle has normal systolic function, with an ejection fraction of 55-60%. The cavity size was normal. Left ventricular diastolic Doppler parameters are consistent with impaired relaxation.  2. The right  ventricle has normal systolic function. The cavity was mildly enlarged. There is no increase in right ventricular wall thickness.  3. The aortic valve is tricuspid. Moderate thickening of  the aortic valve. Moderate calcification of the aortic valve. No stenosis of the aortic valve. Moderate aortic annular calcification noted.  4. The aortic root is normal in size and structure.  5. Pulmonary hypertension is indeterminant, inadequate TR jet.  6. The inferior vena cava was dilated in size with >50% respiratory variability.  7. The interatrial septum was not well visualized.  FINDINGS  Left Ventricle: The left ventricle has normal systolic function, with an ejection fraction of 55-60%. The cavity size was normal. There is no increase in left ventricular wall thickness. Left ventricular diastolic Doppler parameters are consistent with  impaired relaxation. Normal left ventricular filling pressures   Laboratory Data:  High Sensitivity Troponin:  No results for input(s): TROPONINIHS in the last 720 hours.   Cardiac EnzymesNo results for input(s): TROPONINI in the last 168 hours. No results for input(s): TROPIPOC in the last 168 hours.  Chemistry Recent Labs  Lab 07/12/2018 1709 07/31/18 0212 08/01/18 0217  NA 127* 129* 133*  K 4.0 4.0 3.8  CL 98 100 104  CO2 22 19* 17*  GLUCOSE 102* 89 71  BUN 22 21 27*  CREATININE 0.78 0.67 0.74  CALCIUM 8.0* 7.9* 7.7*  GFRNONAA >60 >60 >60  GFRAA >60 >60 >60  ANIONGAP 7 10 12     Recent Labs  Lab 07/13/2018 1709 07/31/18 0212 08/01/18 0217  PROT 5.4* 5.0* 4.8*  ALBUMIN 1.7* 1.7* 1.7*  AST 111* 101* 102*  ALT 37 34 32  ALKPHOS 239* 203* 178*  BILITOT 1.9* 2.2* 2.6*   Hematology Recent Labs  Lab 07/31/18 0212 07/31/18 2249 08/01/18 0217  WBC 12.0* 13.3* 14.2*  RBC 3.82* 3.63* 3.60*  HGB 10.0* 9.8* 9.6*  HCT 33.3* 31.1* 31.4*  MCV 87.2 85.7 87.2  MCH 26.2 27.0 26.7  MCHC 30.0 31.5 30.6  RDW 16.0* 16.1* 16.4*  PLT 38* 44* 36*    BNP Recent Labs  Lab 07/31/18 1229  BNP 33.8      Radiology/Studies:  Ct Head Wo Contrast  Result Date: 07/31/2018 CLINICAL DATA:  78 year old male with altered mental status. EXAM: CT HEAD WITHOUT CONTRAST TECHNIQUE: Contiguous axial images were obtained from the base of the skull through the vertex without intravenous contrast. COMPARISON:  None. FINDINGS: Brain: The ventricles and sulci appropriate size for patient's age. Mild periventricular and deep white matter chronic microvascular ischemic changes noted. There is no acute intracranial hemorrhage. No mass effect or midline shift. No extra-axial fluid collection. Vascular: No hyperdense vessel or unexpected calcification. Skull: Normal. Negative for fracture or focal lesion. Sinuses/Orbits: No acute finding. Other: None IMPRESSION: 1. No acute intracranial hemorrhage. 2. Mild chronic microvascular ischemic changes. Electronically Signed   By: Anner Crete M.D.   On: 07/27/2018 20:30   Ct Abdomen Pelvis W Contrast  Result Date: 07/21/2018 CLINICAL DATA:  Increasing weakness and altered mental status. Recent hospitalization for pulmonary embolism. EXAM: CT ABDOMEN AND PELVIS WITH CONTRAST TECHNIQUE: Multidetector CT imaging of the abdomen and pelvis was performed using the standard protocol following bolus administration of intravenous contrast. CONTRAST:  128mL OMNIPAQUE IOHEXOL 300 MG/ML  SOLN COMPARISON:  Abdominopelvic CT 07/18/2018.  Ultrasound 07/27/2018. FINDINGS: Lower chest: There are small bilateral pleural effusions and mild bibasilar atelectasis. There is a small pericardial effusion. Hepatobiliary: There is a stable cyst in the left hepatic lobe. No suspicious hepatic findings. No evidence of gallstones, gallbladder wall thickening or biliary dilatation. Pancreas: Unremarkable. No pancreatic ductal dilatation or surrounding inflammation. Spleen: Progressive splenomegaly. The spleen measures 16.9 x  11.3 x 17.7 cm (volume = 1770  cm^3) and demonstrates peripheral wedge shaped areas of decreased attenuation suspicious for small splenic infarcts. There is no perisplenic fluid collection. Adrenals/Urinary Tract: The adrenal glands appear stable. 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 There is stable mild fullness of the left adrenal gland. There is a nonobstructing calculus in the upper pole of the left kidney and bilateral renal sinus cyst formation. Proximal left ureteral calculus measuring 5 mm on image 55/2 is unchanged in position. There is no significant hydronephrosis or delay in contrast excretion. Mild bladder wall thickening, likely due to incomplete distension. Stomach/Bowel: No evidence of bowel wall thickening, distention or surrounding inflammatory change. The appendix is not definitely visualized. There are mild diverticular changes of the distal colon. Vascular/Lymphatic: Mildly prominent lymph nodes within the gastrohepatic ligament and porta hepatis are stable. There is no retroperitoneal adenopathy. There is diffuse aortic and branch vessel atherosclerosis without acute vascular findings. Reproductive: Stable mild enlargement of the prostate gland with dystrophic calcifications. Other: There is diffuse edema throughout the subcutaneous and intra-abdominal fat, including the perirectal fat. There is a small amount of pelvic ascites. No focal extraluminal fluid collection or pneumoperitoneum. Musculoskeletal: No acute or significant osseous findings. There are diffuse degenerative changes throughout the lumbar spine. Patient is status post left total hip arthroplasty. Moderate right hip degenerative changes are present. IMPRESSION: 1. Progressive splenomegaly with wedge-shaped areas of decreased attenuation peripherally suspicious for subacute splenic infarcts. 2. Unchanged position of proximal left ureteral calculus compared with CT of 12 days ago. No significant  hydronephrosis or delay in contrast excretion. 3. Interval development of diffuse soft tissue edema, ascites and bilateral pleural effusions consistent with anasarca. 4.  Aortic Atherosclerosis (ICD10-I70.0). Electronically Signed   By: Richardean Sale M.D.   On: 08/02/2018 21:01   Dg Chest Port 1 View  Result Date: 07/27/2018 CLINICAL DATA:  Altered mental status EXAM: PORTABLE CHEST 1 VIEW COMPARISON:  Chest radiograph and chest CT July 27, 2018 FINDINGS: There is mild atelectasis in the left base with minimal left pleural effusion. Lungs elsewhere clear. Heart is upper normal in size with pulmonary vascularity normal. No adenopathy. No bone lesions. IMPRESSION: Mild left base atelectasis with minimal left pleural effusion. Lungs elsewhere clear. Stable cardiac silhouette. Electronically Signed   By: Lowella Grip III M.D.   On: 07/08/2018 19:00   US Liver Doppler  Result Date: 07/28/2018 CLINICAL DATA:  Elevated LFTs EXAM: DUPLEX ULTRASOUND OF LIVER TECHNIQUE: Color and duplex Doppler ultrasound was performed to evaluate the hepatic in-flow and out-flow vessels. COMPARISON:  07/27/2018 FINDINGS: Portal Vein Velocities Main:  25 cm/sec Right:  28 cm/sec Left:  17 cm/sec Hepatic Vein Velocities Right:  98 cm/sec Middle:  44 cm/sec Left:  38 cm/sec Hepatic Artery Velocity:  112 cm/sec Splenic Vein Velocity:  19 cm/sec Varices: Not visualized Ascites: Visualized Patent portal, hepatic and splenic veins with normal directional flow. Negative for portal vein occlusion or thrombus. IMPRESSION: Normal hepatic venous Doppler. Electronically Signed   By: Jerilynn Mages.  Shick M.D.   On: 07/28/2018 20:47    Assessment and Plan:   1. Diffuse anasarca: - patient presented with PE 6/13 discharged on Xarelto. - Admitted again 6/21-6/23, supportive treatment. - Readmitted 6/24 for weakness, fall, increased swelling.  - Despite elevated liver enzymes, GI suspects cardiac etiology.  - Patient on lovenox per  hematology. -  Echo 6/13 revealed LVEF 55-60% mildly enlarged RV but no RV strain.  Echo 6/25 pending - Physical exam shows, bilateral leg edema, tachypenic 2L O2. Patient says breathing improved since yesterday - albumin 1.7, overall decline in health possibly due to other underlying etiology? - would not diurese due to low pressures.   2. HTN:  - per wife h/o of HTN on amlodipine x 3 years but discontinued 6/13 -B/P low on admission, B/Ps still lower 100s/60s - persistent tachycardia, 110-120s, likely due to fluid overload    For questions or updates, please contact [facility] Please consult [email or web address] for contact info under   Signed, Cadence Ninfa Meeker, PA-C  08/01/2018 7:40 AM   History and all data above reviewed.  Patient examined.  I agree with the findings as above.  The patient has had progressive failure to thrive since his pulmonary embolism.  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  Regardless since his pulmonary embolism he has had continued decline.  I went back and reviewed both hospitalizations recently and his imaging.  There was suggestion on the CT of some RV dysfunction but this was not evident on the echo that was done at the time of his pulmonary embolism.  There probably is some RV strain given the size of his embolism.  He has had now progressive weakness.  He presents with edema.  He had hypotension.  Looking in his blood work he has significant abnormalities as above including his thrombocytopenia, anemia, hypoalbuminemia.  BNP has been okay.  EKG is with sinus tach.  He is not had chest pressure or neck pain doctor.  He only reports severe fatigue and does not report shortness of breath but has not been doing anything.  He is lying flat in the bed.  The patient exam reveals GENERAL: He is severely chronically ill-appearing, malnourished but not in acute distress COR: Tachycardia, no murmurs, no  jugular venous distention,  Lungs: Decreased breath sounds bilaterally,  Abd: Splenomegaly evident, no hepatomegaly, decreased bowel sounds, Ext diffuse edema.  All available labs, radiology testing, previous records reviewed. Agree with documented assessment and plan.   ANASARCA: Other might be some component of RV dysfunction elevated RV pressures I do not think that this is the etiology for his overall picture.  I think he has a failure to thrive and malnutrition leading to hypoalbuminemia and diffuse edema.  In fact I suggest he is probably intravascularly volume depleted and would tolerate diuresis.  I do not think a right heart cath would help the situation.  I spoke with his primary team and suggested further investigation and discussion with hematology or others about lung nodules that were identified previously and his splenomegaly.  I would not strongly suspect heart failure including RV failure as a severe contributing factor to his overall decline.   Kalil Dorisann Schwanke  12:31 PM  08/01/2018

## 2018-08-01 NOTE — Progress Notes (Signed)
PHARMACY - PHYSICIAN COMMUNICATION CRITICAL VALUE ALERT - BLOOD CULTURE IDENTIFICATION (BCID)  Luis REUST is an 78 y.o. male who presented to [facility] on 07/11/2018 with a chief complaint of generalized weakness, confusion, fall.   Assessment:  None (include suspected source if known) 1 of 4 bottle Gm + cocci. No ID, likely contaminant Name of physician (or Provider) Contacted: B.Kyere, NP  Current antibiotics: none  Changes to prescribed antibiotics recommended:  Recommendations accepted by provider  Results for orders placed or performed during the hospital encounter of 07/31/2018  Blood Culture ID Panel (Reflexed) (Collected: 07/29/2018  5:09 PM)  Result Value Ref Range   Enterococcus species NOT DETECTED NOT DETECTED   Listeria monocytogenes NOT DETECTED NOT DETECTED   Staphylococcus species NOT DETECTED NOT DETECTED   Staphylococcus aureus (BCID) NOT DETECTED NOT DETECTED   Streptococcus species NOT DETECTED NOT DETECTED   Streptococcus agalactiae NOT DETECTED NOT DETECTED   Streptococcus pneumoniae NOT DETECTED NOT DETECTED   Streptococcus pyogenes NOT DETECTED NOT DETECTED   Acinetobacter baumannii NOT DETECTED NOT DETECTED   Enterobacteriaceae species NOT DETECTED NOT DETECTED   Enterobacter cloacae complex NOT DETECTED NOT DETECTED   Escherichia coli NOT DETECTED NOT DETECTED   Klebsiella oxytoca NOT DETECTED NOT DETECTED   Klebsiella pneumoniae NOT DETECTED NOT DETECTED   Proteus species NOT DETECTED NOT DETECTED   Serratia marcescens NOT DETECTED NOT DETECTED   Haemophilus influenzae NOT DETECTED NOT DETECTED   Neisseria meningitidis NOT DETECTED NOT DETECTED   Pseudomonas aeruginosa NOT DETECTED NOT DETECTED   Candida albicans NOT DETECTED NOT DETECTED   Candida glabrata NOT DETECTED NOT DETECTED   Candida krusei NOT DETECTED NOT DETECTED   Candida parapsilosis NOT DETECTED NOT DETECTED   Candida tropicalis NOT DETECTED NOT DETECTED    Dorrene German 08/01/2018  12:38 AM

## 2018-08-01 NOTE — Progress Notes (Signed)
  Echocardiogram 2D Echocardiogram has been performed.  Luis Cisneros 08/01/2018, 3:10 PM

## 2018-08-01 NOTE — Progress Notes (Signed)
CRITICAL VALUE ALERT  Critical Value:  LA 7.1  Date & Time Notied:  08/01/18 1630  Provider Notified: Dr. Lonny Prude  Orders Received/Actions taken: Continue watching pt. Closely.

## 2018-08-01 NOTE — Progress Notes (Addendum)
PROGRESS NOTE  Luis Cisneros WGY:[id number] DOB: 23-Feb-1940 DOA: 08/02/2018 PCP: Elby Showers, MD  Brief History   Luis Cisneros is a 78 y.o. year old male with medical history significant for recently diagnosed pulmonary embolus and bilateral lower extremity DVTs on Xarelto anticoagulation, hypertension, hyperlipidemia, depression, and hard of hearingwho presented on 07/21/2018 with complaints of worsening weakness, confusion and abdominal distension after most recent discharge from hospital on 6/23.  His wife was concerned as after discharge he was unable to even bear his own weight to get out of the car and she required the assistance of his neighbors with reported fall.  He was seen by PT at home who was also concerned about his significant weakness, lethargy, and decreased alertness. She discussed his significant weakness with his PCP in addition to worsening abdominal and lower leg swelling and was advised to come to ED.    Wife has noticed steady decline that started for initial evaluation of prolonged cough ( starting in April, COVID negative at time) treated with doxycycline x 10 days when evaluated by [facility] in 5/29. Labwork obtained for evaluation of unexplained fatigue found to have mildly elevated liver enzymes (AST, ALT, alk phos) on follow up with PCP on 6/12.  During that visit patient looked much weaker, confused, tachycardic and hypoxic.  His PCP instructed him to go to ED at that time which is when he was initially admitted on 6/12 and found to have PE (discussed more below).  Per wife he has not had a bowel movement in a week, has difficulty initiating urination, continues to have intermittent confusion and lethargy ( ongoing since May).  his previous baseline he was functional and independent). 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 Also she noticed hand shakes when reaching for something around the same time.  Wife noticed confusion about 24-36 hours before June 12 admission. She would say something to him and it would take him a while to process it and understand it. She attributed to his poor hearing ( doesn't always wear hearing aids). Sometimes it would be confusion about conversations he would think he had with his wife but really had with his daughter. Prior to this she states his memory was great.    Hospital course from prior admissions 07/18/2018-07/22/2018 Found to have a left segmental pulmonary embolus and bilateral lower extremity DVTs. Patient was treated with IV heparin for 72 hours and transition to oral Xarelto on discharge. He was discharged on 2 L home supplemental oxygen due to persistent hypoxia while admitted. 2D echo 07/19/2018 was negative for right ventricular strain was 55-60%.   Hypercoagulable work-up obtained 07/18/2018 was notable for decreased protein S activity of 37% with total protein S antigen within normal limits at 143%. Lupus anticoagulant DRVVT was 55.2 and corrected on mixing study. Total protein C was decreased at 53% with protein C activity within normal limits at 78%.  07/27/2018-07/29/2018  Chief complaint of generalized weakness/confusion and lethargy. He was treated with supportive care. Repeat CTA chest showed persistent known PE. He was also noted to have worsening thrombocytopenia concerning for HIT. HIT antibody was obtained and has resulted negative today 07/27/2018. He has been on continued Xarelto anticoagulation.   He was also noted to have continued abnormal LFTs of unknown etiology. Previous hepatitis B panel was negative and acute hepatitis panel was negative. RUQ ultrasound showed  gallbladder sludge without sonographic evidence for acute cholecystitis but did show echogenic liver suggesting steatosis and/or hepatocellular disease. Left hepatic  lobe cyst was noted. Follow-up ultrasound liver Doppler showed normal hepatic venous Dopplers. PT/OT were consulted and recommended home health upon discharge.    A & P  1. Hypotension and lactic acidosis .  SBP's in 80s to 90s still oriented x4 but sleepy (arousable easily). Lactic acid this pm was 6.8, trend q4H--improved to 104/60s ( MAP 70s) after 500 cc bolus.  Start maintenance fluids TTE for evaluation of RV systolic function, CXR unremarkable, no abdominal pain. Remains afebrile has 1 of 2 cultures of GPC's with no ID on blood culture. Curbsided ID who agreed to monitor given no indwelling ports/catheters and no need to start antibiotics, I suspect likely contaminant. If becomes febrile will start empiric antibiotics  Repeating blood cultures.   2. SIRS criteria: tachycardic, leukocytosis now with worsening hypotension and lactic acidosis. Has remained afebrile. Failure to thrive picture. No clear infectious etiology ( blood cultures only showing suspected contaminant), CT abd on admission with no infectious possibilties, repeat CXR shows atelectasis and stable small pleural effusion, if febrile will start empiric antibiotics. TTE is pending to evaluate RV function and even pericardial effusion noted on prior imaging. Repeated blood cultures today given persistent hypotn/lactic acidosis  3. Diffuse Anasarca.  Bilateral pleural effusion, ascites, small pericardial effusion on imaging with very low albumin. elevated INR, thrombocytopenia, mildly elevated LFTs and findings on recent abdominal ultrasound but GI doubt this is primary liver etiology they are reviewing imaging, hypoalbuminemia is also likely contributor but unclear why query possible malignancy given overall failure to thrive and  pulmonary nodules on recent CTA, unfortunately no obvious area to biopsy, discussed with heme/onc   4. Concern for liver disease.  Repeating TTE for formal evaluation of right heart strain related to PE,  cardiology agrees there is some component of RV dysfunction but do not feel this is primary etiology of patient's poor clinical status and more concerned for failure to thrive/malnutrition/hypoalbuminemia and diffuse edema they do not recommend any diuresis given his relative hypotension and believe he is more intravascularly depleted related to his low albumin.   5. Acute hyponatremia, slowly improving likely due to intravascular depletion from hypoalbuminemia. Continue IVF pending CXR and TTE, carefully while awaiting formal evalution of RV function ( if there is some dysfunction systolically patient would like to be preload dependent)  6. Subacute Intermittent Confusion, slow speech and decrease in independent activities progressive x 2 months, unclear etiology.  Alert and oriented to place, time, person, context on my exam though very slow in speech with no other focal deficits.  Ammonia within normal limits with no asterixis, GI recommended discontinuing rifaximin, still on lactulose though doubt hepatic encephalopathy.Normal CO2 on VBG, CT head chronic microvascular changes, nothing acute  7. Splenomegaly and subacute infarcts. Has known blood clots in lung as well.?  Possible malignancy causing hypercoagulable episodes?  Hematology/oncology recommended switching from home Xarelto to Lovenox on admission, closely monitor   8. Chronic hypoxic respiratory failure secondary to recent PE (also bilateral DV) Stable O2 requirements of  2L, more tachypneic today and some conversational dyspnea, no obvious changes heard on exam. Get stat CXR . CTA (6/22) showed near occlusive pe of left lower lobe with similar clot burden. Peripheral opacity ? supleural mass or developing pulmonary infarction.  Consulted Pulmonary/PCCM and discussed case this am. Agree with TTE to assess RV function given hemodynamic stability ( mild tachycardia,  normal BP, stable O2)   9. Several Right sided Pulmonary nodules of right lower  lobe. Non-contrast chest CT in 3-6 months is recommended as outpatient  10. Subacute Thrombocytopenia, stable Hem suspects consumptive process (hypersplenism). HIT ab  negative. Hematology recommended lovenox and platelet transfusion (plt 38), closely monitor, no current bleeding. Elevated ddimer and normal fibrinogen on DIC panel  11. Mildly Elevated AST/Alk phos, stable.  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 Ongoing since end of May. Hep panel negative. No longer taking statin since last admission concern for hepatic disease. Slight elevation in alk phos and total bilirubin no abd pain on exam. Trend cmp  12. Normocytic anemia. Seems new since 06/2018. Check iron panel. ? Anemia of chronic disease? No bleeding episodes  13. Constipation. No BM for a week per wife. Monitor on lactulose. No acute findings on ct abdomen.    14. Generalized weakness. Likely attributed to diffuse anasarca, electrolyte abnormalities and poor alertness related to encephalopathy. Try to reverse medical conditions. PT/OT consulted recs SNF  15. Depression. Stable. Home bupropion  16. GPR in culture from 6/22 and GPC in 1 of 2 (aerobic) culture from 6/24.  6/24 with GPC but negative BCID. Spoke over phone with ID (Dr. Johnnye Sima) who agreed to monitor given no indwelling ports/catheters and no need to start antibiotics, I suspect likely contaminant. If becomes febrile will start empiric antibiotics  Contamination suspected will discuss with ID    I spent greater than 30 minutes coordinating care for this patient  DVT prophylaxis: Lovenox Code Status: FULL Family Communication: will call wife and give update Disposition Plan: Continue to monitor in stepdown given hypotension, fluctuating mental status, thrombocytopenia.  Awaiting TTE, close monitoring of blood pressure, low threshold to add antibiotics and or pressors   Luis Cisneros D Orvan Papadakis  [facility] Direct contact: see [email or web address] (further directions at bottom of note if needed) 7PM-7AM contact night coverage as at bottom of note 08/01/2018, 12:38 PM  LOS: 2 days   Consultants   PCCM/pulmonary  Cardiology  Gastroenterology  Procedures   TTE pending  Antibiotics   None  Interval History/Subjective  This morning he feels his breathing is little bit more labored Denies any belly pain  Objective   Vitals:  Vitals:   08/01/18 1200 08/01/18 1210  BP: (!) 63/21 (!) 96/54  Pulse: (!) 121 (!) 119  Resp: (!) 28 (!) 27  Temp: 97.9 F (36.6 C)   SpO2: 96% 96%    Exam:  Constitutional:   Appears calm and comfortable, chronically ill-appearing Eyes:   pupils and irises appear normal  Normal lids and conjunctivae ENMT:   Very hard of hearing  Lips appear normal  external ears, nose appear normal  Oropharynx: mucosa, tongue,posterior pharynx appear normal Neck:   neck appears normal, no masses, normal ROM, supple Respiratory:   Increased work of breathing, with no accessory muscle usage, tachypneic, breath sounds heard on anterior chest, no wheezes or rhonchi or crackles Cardiovascular:   Tachycardic, 2+ pitting edema of bilateral legs, no appreciable murmurs rubs or gallops  Abdomen:   Abdomen appears normal; no tenderness, splenomegaly   Skin:   No rashes, lesions, ulcers  palpation of skin: no induration or nodules Neurologic:   Sensation all 4 extremities intact Psychiatric:   Mental status o Flat affect o Orientation to person, place, time, context     I have personally reviewed the following:   Today's Data   Lactic  acid 6.8--7.1  Lab Data   Iron profile pending  WBC 14.2, hemoglobin 9.6, platelets 36  BUN 27, creatinine 0.74, CO2 17, sodium 133, AST 102, T bili 2.6    Micro Data   6/24 1 of 2 blood cultures growing GPC's, blood culture ID negative  6/22 1 of 2 blood cultures growing GPR  Imaging    Chest x-ray, 6/26: Stable left atelectasis with minimal left pleural effusion  6/24 CT abdomen: Progressive splenomegaly, wedge-shaped areas suspicious for subacute splenic infarcts, unchanged left ureteral calculus, no hydronephrosis, interval development of diffuse soft tissue edema, ascites and bilateral pleural effusions consistent with anasarca  6/24 CT  head: No acute intracranial hemorrhage, mild chronic microvascular ischemic changes  6/24 chest x-ray: Mild left base atelectasis with minimal left lower effusion  Cardiology Data   6/26 TTE pending  6/13 TEE: EF 60%, normal right ventricle systolic function, mildly enlarged, no increase in right ventricular wall thickness, pulmonary hypertension is indeterminate, inadequate TR jet, inferior vena cava was dilated in size with greater than 50% respiratory variability  Other Data     Scheduled Meds:  buPROPion  300 mg Oral Daily   camphor-menthol   Topical BID   Chlorhexidine Gluconate Cloth  6 each Topical Daily   enoxaparin (LOVENOX) injection  80 mg Subcutaneous Q12H   lactulose  10 g Oral TID   mouth rinse  15 mL Mouth Rinse BID   senna-docusate  1 tablet Oral BID   Continuous Infusions:  Principal Problem:   Generalized weakness Active Problems:   History of pulmonary embolism   PE (pulmonary thromboembolism) (HCC)   Depression   Transaminitis   Constipation   Hyponatremia   Thrombocytopenia (HCC)   Encephalopathy   Alkaline phosphatase elevation   Lactic acid acidosis   Anasarca   Elevated INR   Splenic infarct   Hypoalbuminemia   LOS: 2 days   Desiree Hane  [facility] How to contact the [facility] Attending or Consulting provider [place] or covering provider during after hours 7P -7A, for this patient?  1. Check the care team in [facility] and look for a) attending/consulting [facility] provider listed and b) the [facility], [facility] team listed 2. Log into [email or web address] and use [place]'s universal password to  access. If you do not have the password, please contact the hospital operator. 3. Locate the [facility] provider you are looking for under [facility] and page to a number that you can be directly reached. 4. If you still have difficulty reaching the provider, please page the [facility] (Director on Call) for the Hospitalists listed on amion for assistance.

## 2018-08-02 ENCOUNTER — Inpatient Hospital Stay (HOSPITAL_COMMUNITY): Payer: Medicare Other

## 2018-08-02 DIAGNOSIS — R579 Shock, unspecified: Secondary | ICD-10-CM

## 2018-08-02 LAB — BASIC METABOLIC PANEL
Anion gap: 16 — ABNORMAL HIGH (ref 5–15)
BUN: 55 mg/dL — ABNORMAL HIGH (ref 8–23)
CO2: 13 mmol/L — ABNORMAL LOW (ref 22–32)
Calcium: 7.2 mg/dL — ABNORMAL LOW (ref 8.9–10.3)
Chloride: 107 mmol/L (ref 98–111)
Creatinine, Ser: 1.79 mg/dL — ABNORMAL HIGH (ref 0.61–1.24)
GFR calc Af Amer: 41 mL/min — ABNORMAL LOW (ref >60)
GFR calc non Af Amer: 36 mL/min — ABNORMAL LOW (ref >60)
Glucose, Bld: 87 mg/dL (ref 70–99)
Potassium: 5.1 mmol/L (ref 3.5–5.1)
Sodium: 136 mmol/L (ref 135–145)

## 2018-08-02 LAB — CBC WITH DIFFERENTIAL/PLATELET
Band Neutrophils: 1 %
Basophils Absolute: 0.2 10*3/uL — ABNORMAL HIGH (ref 0.0–0.1)
Basophils Relative: 1 %
Blasts: 0 %
Eosinophils Absolute: 0 10*3/uL (ref 0.0–0.5)
Eosinophils Relative: 0 %
HCT: 31.9 % — ABNORMAL LOW (ref 39.0–52.0)
Hemoglobin: 9.5 g/dL — ABNORMAL LOW (ref 13.0–17.0)
Lymphocytes Relative: 35 %
Lymphs Abs: 7 10*3/uL — ABNORMAL HIGH (ref 0.7–4.0)
MCH: 26.8 pg (ref 26.0–34.0)
MCHC: 29.8 g/dL — ABNORMAL LOW (ref 30.0–36.0)
MCV: 90.1 fL (ref 80.0–100.0)
Metamyelocytes Relative: 3 %
Monocytes Absolute: 1.4 10*3/uL — ABNORMAL HIGH (ref 0.1–1.0)
Monocytes Relative: 7 %
Myelocytes: 4 %
Neutro Abs: 11.4 10*3/uL — ABNORMAL HIGH (ref 1.7–7.7)
Neutrophils Relative %: 49 %
Other: 0 %
Platelets: 30 10*3/uL — ABNORMAL LOW (ref 150–400)
Promyelocytes Relative: 0 %
RBC: 3.54 MIL/uL — ABNORMAL LOW (ref 4.22–5.81)
RDW: 16.5 % — ABNORMAL HIGH (ref 11.5–15.5)
WBC: 20 10*3/uL — ABNORMAL HIGH (ref 4.0–10.5)
nRBC: 21 /100 WBC — ABNORMAL HIGH
nRBC: 7.4 % — ABNORMAL HIGH (ref 0.0–0.2)

## 2018-08-02 LAB — LACTIC ACID, PLASMA
Lactic Acid, Venous: 8.4 mmol/L (ref 0.5–1.9)
Lactic Acid, Venous: 8.7 mmol/L (ref 0.5–1.9)
Lactic Acid, Venous: 8.6 mmol/L (ref 0.5–1.9)

## 2018-08-02 LAB — BLOOD CULTURE ID PANEL (REFLEXED)

## 2018-08-02 LAB — COMPREHENSIVE METABOLIC PANEL
ALT: 32 U/L (ref 0–44)
AST: 133 U/L — ABNORMAL HIGH (ref 15–41)
Albumin: 1.6 g/dL — ABNORMAL LOW (ref 3.5–5.0)
Alkaline Phosphatase: 163 U/L — ABNORMAL HIGH (ref 38–126)
Anion gap: 17 — ABNORMAL HIGH (ref 5–15)
BUN: 44 mg/dL — ABNORMAL HIGH (ref 8–23)
CO2: 12 mmol/L — ABNORMAL LOW (ref 22–32)
Calcium: 7.3 mg/dL — ABNORMAL LOW (ref 8.9–10.3)
Chloride: 106 mmol/L (ref 98–111)
Creatinine, Ser: 1.26 mg/dL — ABNORMAL HIGH (ref 0.61–1.24)
GFR calc Af Amer: >60 mL/min (ref >60)
GFR calc non Af Amer: 54 mL/min — ABNORMAL LOW (ref >60)
Glucose, Bld: 47 mg/dL — ABNORMAL LOW (ref 70–99)
Potassium: 4.7 mmol/L (ref 3.5–5.1)
Sodium: 135 mmol/L (ref 135–145)
Total Bilirubin: 2.8 mg/dL — ABNORMAL HIGH (ref 0.3–1.2)
Total Protein: 4.3 g/dL — ABNORMAL LOW (ref 6.5–8.1)

## 2018-08-02 LAB — GLUCOSE, CAPILLARY
Glucose-Capillary: 21 mg/dL — CL (ref 70–99)
Glucose-Capillary: 67 mg/dL — ABNORMAL LOW (ref 70–99)
Glucose-Capillary: 76 mg/dL (ref 70–99)
Glucose-Capillary: 96 mg/dL (ref 70–99)
Glucose-Capillary: 100 mg/dL — ABNORMAL HIGH (ref 70–99)
Glucose-Capillary: 70 mg/dL (ref 70–99)
Glucose-Capillary: 55 mg/dL — ABNORMAL LOW (ref 70–99)
Glucose-Capillary: 72 mg/dL (ref 70–99)
Glucose-Capillary: 114 mg/dL — ABNORMAL HIGH (ref 70–99)

## 2018-08-02 LAB — CULTURE, BLOOD (ROUTINE X 2)
Culture: NO GROWTH
Special Requests: ADEQUATE
Special Requests: ADEQUATE

## 2018-08-02 LAB — BLOOD GAS, ARTERIAL
Acid-base deficit: 13 mmol/L — ABNORMAL HIGH (ref 0.0–2.0)
Bicarbonate: 11.3 mmol/L — ABNORMAL LOW (ref 20.0–28.0)
Drawn by: 514251
FIO2: 28
O2 Saturation: 90.5 %
Patient temperature: 97.4
pCO2 arterial: 21 mmHg — ABNORMAL LOW (ref 32.0–48.0)
pH, Arterial: 7.345 — ABNORMAL LOW (ref 7.350–7.450)
pO2, Arterial: 65.5 mmHg — ABNORMAL LOW (ref 83.0–108.0)

## 2018-08-02 LAB — CBC
HCT: 30 % — ABNORMAL LOW (ref 39.0–52.0)
Hemoglobin: 9 g/dL — ABNORMAL LOW (ref 13.0–17.0)
MCH: 26.8 pg (ref 26.0–34.0)
MCHC: 30 g/dL (ref 30.0–36.0)
MCV: 89.3 fL (ref 80.0–100.0)
Platelets: 34 10*3/uL — ABNORMAL LOW (ref 150–400)
RBC: 3.36 MIL/uL — ABNORMAL LOW (ref 4.22–5.81)
RDW: 17 % — ABNORMAL HIGH (ref 11.5–15.5)
WBC: 29.1 10*3/uL — ABNORMAL HIGH (ref 4.0–10.5)
nRBC: 8.5 % — ABNORMAL HIGH (ref 0.0–0.2)

## 2018-08-02 LAB — PLATELET COUNT: Platelets: 40 10*3/uL — ABNORMAL LOW (ref 150–400)

## 2018-08-02 LAB — CORTISOL: Cortisol, Plasma: 20.3 ug/dL

## 2018-08-02 LAB — BILIRUBIN, DIRECT: Bilirubin, Direct: 1.9 mg/dL — ABNORMAL HIGH (ref 0.0–0.2)

## 2018-08-02 LAB — LACTATE DEHYDROGENASE: LDH: 2645 U/L — ABNORMAL HIGH (ref 98–192)

## 2018-08-02 LAB — PROCALCITONIN: Procalcitonin: 7.58 ng/mL

## 2018-08-02 LAB — GAMMA GT: GGT: 83 U/L — ABNORMAL HIGH (ref 7–50)

## 2018-08-02 MED ORDER — NOREPINEPHRINE 4 MG/250ML-% IV SOLN
0.0000 ug/min | INTRAVENOUS | Status: DC
  Administered 2018-08-02: 2 ug/min via INTRAVENOUS
  Filled 2018-08-02 (×3): qty 250

## 2018-08-02 MED ORDER — DEXTROSE 50 % IV SOLN
INTRAVENOUS | Status: AC
  Administered 2018-08-02: 05:00:00
  Filled 2018-08-02: qty 50

## 2018-08-02 MED ORDER — SODIUM CHLORIDE 0.9% IV SOLUTION
Freq: Once | INTRAVENOUS | Status: AC
  Administered 2018-08-02: 12:00:00 via INTRAVENOUS

## 2018-08-02 MED ORDER — PHENYLEPHRINE HCL-NACL 10-0.9 MG/250ML-% IV SOLN
0.0000 ug/min | INTRAVENOUS | Status: DC
  Administered 2018-08-02: 03:00:00 20 ug/min via INTRAVENOUS
  Administered 2018-08-02: 07:00:00 50 ug/min via INTRAVENOUS
  Filled 2018-08-02 (×3): qty 250
  Filled 2018-08-02: qty 500

## 2018-08-02 MED ORDER — DEXTROSE 50 % IV SOLN
INTRAVENOUS | Status: AC
  Administered 2018-08-02: 17:00:00
  Filled 2018-08-02: qty 50

## 2018-08-02 MED ORDER — HYDROCORTISONE NA SUCCINATE PF 100 MG IJ SOLR
100.0000 mg | Freq: Four times a day (QID) | INTRAMUSCULAR | Status: DC
  Administered 2018-08-02 – 2018-08-04 (×7): 100 mg via INTRAVENOUS
  Filled 2018-08-02 (×7): qty 2

## 2018-08-02 MED ORDER — VANCOMYCIN HCL IN DEXTROSE 1-5 GM/200ML-% IV SOLN
1000.0000 mg | INTRAVENOUS | Status: DC
  Administered 2018-08-03 – 2018-08-04 (×2): 1000 mg via INTRAVENOUS
  Filled 2018-08-02 (×2): qty 200

## 2018-08-02 MED ORDER — VANCOMYCIN HCL 10 G IV SOLR
1500.0000 mg | Freq: Once | INTRAVENOUS | Status: AC
  Administered 2018-08-02: 1500 mg via INTRAVENOUS
  Filled 2018-08-02: qty 1500

## 2018-08-02 MED ORDER — NOREPINEPHRINE BITARTRATE 1 MG/ML IV SOLN
0.0000 ug/min | INTRAVENOUS | Status: DC
  Filled 2018-08-02: qty 4

## 2018-08-02 MED ORDER — PHENYLEPHRINE HCL-NACL 40-0.9 MG/250ML-% IV SOLN
0.0000 ug/min | INTRAVENOUS | Status: DC
  Administered 2018-08-02: 12:00:00 160 ug/min via INTRAVENOUS
  Filled 2018-08-02: qty 250

## 2018-08-02 MED ORDER — SODIUM CHLORIDE 0.9 % IV BOLUS
1000.0000 mL | Freq: Once | INTRAVENOUS | Status: AC
  Administered 2018-08-02: 1000 mL via INTRAVENOUS

## 2018-08-02 MED ORDER — SODIUM CHLORIDE 0.9 % IV SOLN
500.0000 mg | Freq: Three times a day (TID) | INTRAVENOUS | Status: DC
  Administered 2018-08-02 – 2018-08-04 (×6): 500 mg via INTRAVENOUS
  Filled 2018-08-02 (×6): qty 500

## 2018-08-02 MED ORDER — LIP MEDEX EX OINT
TOPICAL_OINTMENT | CUTANEOUS | Status: AC
  Administered 2018-08-02: 10:00:00
  Filled 2018-08-02: qty 7

## 2018-08-02 MED ORDER — ALBUMIN HUMAN 5 % IV SOLN
12.5000 g | Freq: Once | INTRAVENOUS | Status: AC
  Administered 2018-08-02: 02:00:00 12.5 g via INTRAVENOUS
  Filled 2018-08-02: qty 250

## 2018-08-02 MED ORDER — VASOPRESSIN 20 UNIT/ML IV SOLN
0.0300 [IU]/min | INTRAVENOUS | Status: DC
  Administered 2018-08-02: 0.03 [IU]/min via INTRAVENOUS
  Filled 2018-08-02: qty 2

## 2018-08-02 MED ORDER — DEXTROSE-NACL 5-0.9 % IV SOLN
INTRAVENOUS | Status: DC
  Administered 2018-08-02: 05:00:00 via INTRAVENOUS

## 2018-08-02 NOTE — Progress Notes (Signed)
Pt. Has only had 121ml of urine output today. Md made aware.

## 2018-08-02 NOTE — Progress Notes (Signed)
eLink Physician-Brief Progress Note Patient Name: ALMA MUEGGE DOB: October 04, 1940 MRN: [id number]   Date of Service  08/02/2018  HPI/Events of Note  Patient persistently hypotensive despite fluid bolus and albumin.  Now with increased RR and increased WOB.  BP is currently 76/49 (55), RR 29, O2 sats of 96% and HR of 118.  eICU Interventions  Plan: ABG PCXR Aline to verify BP NEO gtt for BP support     Intervention Category Intermediate Interventions: Respiratory distress - evaluation and management;Hypotension - evaluation and management  Willodean Leven 08/02/2018, 2:53 AM

## 2018-08-02 NOTE — Progress Notes (Signed)
Throughout the day pt. Has continued to drop his pressures. RN continued to turn up Neo and let MD know when Neo was being turned up every 5-88min. MD then ordered 1L bolus for pt. And for pt. To be on Levo. RN started pt. On Levo. Pressures continued to drop at 10 of Levo. RN made MD aware and ordered Vaso. RN started vaso and pt. Pressures have stabilized. Blood cultures returned MD ordered Vancomycin and Primaxin. Pt. Only had a midline at this time, pt. Other IV was burning. RN could not run other medications and antibiotics through one line. MD made aware RN was told to let IV team. IV team was able to get a right hand IV RN started Primaxin first as ordered by MD. Luis Cisneros was still on hold because antibiotics were incompatible. MD was waiting for a central line to see what the pt. Platelets were.Platelets came back at 30, MD came and put in a triple lumen left subclavian. RN started Vanc on pt. RN then gave report to night RN.

## 2018-08-02 NOTE — Progress Notes (Signed)
Pt. Is complaining about burning at the foley site. RN checked the site and flushed the foley with 10cc it seems to be working properly. Pt. Has only put out 67ml of urine today. RN also bladder scanned to make sure it was eliminating properly and found nothing in the bladder. RN will continue to monitor closely.

## 2018-08-02 NOTE — Progress Notes (Signed)
Discovered pt has low cbg, treated per protocol.  MD made aware, new orders to change pt IVF

## 2018-08-02 NOTE — Progress Notes (Signed)
CRITICAL VALUE ALERT  Critical Value:  Lactic Acid 8.4  Date & Time Notied:  08/02/2018  Provider Notified: E-Link, MD  Orders Received/Actions taken: will follow previous orders just received.

## 2018-08-02 NOTE — Progress Notes (Addendum)
[facility] Progress Note  CC:  Anasarca, elevated LFTs and splenomegaly   Subjective: He denies having any pain, answering simple questions, follows commands, appears restless. Hypotensive overnight, now on Neo gtt IV. Nurse reports patient had normal brown stool last night, no blood or melena.  Objective:  Vital signs in last 24 hours: Temp:  [97.4 F (36.3 C)-98.5 F (36.9 C)] 97.4 F (36.3 C) (06/27 0400) Pulse Rate:  [109-127] 117 (06/27 0630) Resp:  [21-34] 25 (06/27 0630) BP: (59-111)/(21-70) 65/46 (06/27 0400) SpO2:  [91 %-100 %] 94 % (06/27 0630) Arterial Line BP: (81-132)/(14-43) 104/35 (06/27 0645) Weight:  [90.3 kg] 90.3 kg (06/27 0500) Last BM Date: 07/23/18 General:   Alert to name, critically ill appearing male Eyes: no scleral icterus. PERRLA. Heart: Tachycardic, no murmur Pulm: Coarse breath sounds throughout  Abdomen: Distended, soft, ? Ascites, + splenomegaly, hypoactive BS x 4 quads Extremities: Anasarca to LEs Neurologic:  Alert and  oriented x4;  grossly normal neurologically. Patient unable to perform asterixis exam. Psych:  Alert to name, appears restless, follows some simple commands.  Intake/Output from previous day: 06/26 0701 - 06/27 0700 In: 2203.7 [I.V.:1703.7; IV Piggyback:500] Out: 485 [Urine:485] Intake/Output this shift: No intake/output data recorded.  Lab Results: Recent Labs    07/31/18 2249 08/01/18 0217 08/01/18 1035 08/01/18 2350  WBC 13.3* 14.2*  --  20.0*  HGB 9.8* 9.6*  --  9.5*  HCT 31.1* 31.4*  --  31.9*  PLT 44* 36* 34* 30*   BMET Recent Labs    07/31/18 0212 08/01/18 0217 08/01/18 2350  NA 129* 133* 135  K 4.0 3.8 4.7  CL 100 104 106  CO2 19* 17* 12*  GLUCOSE 89 71 47*  BUN 21 27* 44*  CREATININE 0.67 0.74 1.26*  CALCIUM 7.9* 7.7* 7.3*   LFT Recent Labs    08/01/18 2350  PROT 4.3*  ALBUMIN 1.6*  AST 133*  ALT 32  ALKPHOS 163*  BILITOT 2.8*   PT/INR Recent Labs     08/04/2018 1709 08/01/18 1035  LABPROT 17.9* 19.3*  INR 1.5* 1.7*   Hepatitis Panel No results for input(s): HEPBSAG, HCVAB, HEPAIGM, HEPBIGM in the last 72 hours.  Dg Chest Port 1 View  Result Date: 08/02/2018 CLINICAL DATA:  78 year old male with respiratory distress EXAM: PORTABLE CHEST 1 VIEW COMPARISON:  Chest radiograph dated 08/01/2018 FINDINGS: Shallow inspiration with probable bibasilar atelectasis, left greater right. Developing infiltrate is not excluded. No pleural effusion or pneumothorax. Stable cardiac silhouette. No acute osseous pathology. IMPRESSION: Shallow inspiration with bibasilar atelectasis. Developing infiltrate is not excluded. Electronically Signed   By: Anner Crete M.D.   On: 08/02/2018 03:26   Dg Chest Port 1 View  Result Date: 08/01/2018 CLINICAL DATA:  Dyspnea, tachypnea. EXAM: PORTABLE CHEST 1 VIEW COMPARISON:  Radiograph of July 30, 2018. FINDINGS: Stable cardiomediastinal silhouette. No pneumothorax is noted. Right lung is clear. Minimal left basilar subsegmental atelectasis is noted with possible minimal left pericardial effusion. Bony thorax is unremarkable. IMPRESSION: Stable minimal left basilar subsegmental atelectasis with minimal left pleural effusion. Electronically Signed   By: Marijo Conception M.D.   On: 08/01/2018 13:24   Dg Abd Portable 2v  Result Date: 08/01/2018 CLINICAL DATA:  Abdominal distension EXAM: PORTABLE ABDOMEN - 2 VIEW COMPARISON:  CT dated July 30, 2018 FINDINGS: The bowel gas pattern is nonobstructive. Again noted is splenomegaly. There is no pneumatosis or free air. The known left-sided ureteral stone is not well visualized  on this exam. Phleboliths project over the patient's pelvis. There are end-stage degenerative changes of the right hip. The patient is status post prior total hip arthroplasty on the left. Degenerative changes are noted throughout the visualized lumbar spine. IMPRESSION: 1. Nonobstructive bowel gas pattern. 2.  Chronic changes as above. Electronically Signed   By: Constance Holster M.D.   On: 08/01/2018 20:24    Assessment / Plan:  1. 78 y.o. male with elevated LFTs, initially thought to be related to right heart failure. Repeat ECHO 6/26 showed normal RV function, IVC dilated > 50%.  LEV 60-65%. Acute hepatitis panel negative. 6/27: INR 1.7. Albumin 1.6. Alk phos 163. AST 133. ALT 32. AST > ALT ratio is suggestive of EtOH hepatitis but patient's reported history does not support this. T. Bili 2.8 >> 2.6. Abd sono  6/21: gallbladder sludge, no stones, CBD 54m, hepatic steatosis vs hepatocellular dz, 2.2cm left lobe cyst. Patent portal vein. Normal liver doppler. CTAB 6/24: progressive splenomegaly suspicious for small splenic infarcts, stable liver cyst, mildly prominent lymph nodes within the gastrohepatic ligament and porta hepatis, anasarca and a small amount of pelvic ascites. -add direct/indirect bili to am labs -check GGT -not a candidate for liver biopsy at this time -consider repeat abd sono to assess for ascites -await TEE results  2. Splenomegaly with infarct unclear etiology. No obvious cirrhosis. HIV negative. Unlikely viral (EBV), ? Malignancy ie: myeloproliferative disease, multiple myeloma, lymphoma. -check EBV IgM -check SPEP if not already done -appreciate further input from heme/onc  3. Bilateral DVT/PE  Secondary to hypercoagulable disorder of unclear etiology. Protein S and Protein C deficiency.  On Lovenox. INR 1.7.  4.  Thrombocytopenia secondary to splenomegaly. PLT 30 < 34.  -heme/onc following -plt transfusion as needed  5. Normocytic Anemia. Hg 9.5. HCT 31.9. MCV 90.1. Iron 109. -monitor H/H -transfuse for Hg < 7-8  6. Leukocytosis: WBC 20 >> 14.2. ANC 11.4. Blood cx pending. He remains afebrile.  7. Anasarca of unclear etiology, hypoalbuminemia, ? underlying liver disease, malignant process vs nephrotic syndrome. Received Albumin IV at 2am today.  8.  AKI  secondary to hypovolemia.  Possible nephrotic syndrome. Creatinine 1.26 up from 0.74. UA 6/24 +protein mg/d, + RBC/HPF 11-20.  -consider nephrology consult, 24hr urine protein level, r/o nephrotic syndrome.  If patient found to have nephrotic syndrome this could explain etiology for DVT/PE, splenic and pulmonary infarct as there is a higher incidence of venous and arterial thrombosis in nephrotic syndrome.   9. Lactic Acid levels continue to rise 8.4 >> 7.0 concerning for ongoing ischemia  10. Hypotension now on Neo infusion  11. Encephalopathy, multifactorial. On Lactulose.  6/25 Ammonia 37 -stop lactulose     Further recommendations per Dr. GLyndel Safe    LOS: 3 days   CNoralyn Pick 08/02/2018, 7:06 AM     Attending physician's note   I have reviewed the chart and examined the patient. I agree with the Advanced Practitioner's note, impression and recommendations.   Have reviewed CTs with Dr GAletta Edouard Have discussed briefly with Dr GLindi Adie  Abn LFTs with hepatosplenomegaly s/o congestive or infiltrative process (like amyloidosis, lymphoma etc).  Not a candidate for liver biopsy.  [place]orea[place]ppler negative for Budd-Chiari syndrome.  No underlying liver cirrhosis.  Plan: -Continue supportive treatment. -Trend LFTs. -Agree with IV albumin. -Will follow along. Poor overall prognosis.  RCarmell Austria MD LVelora HecklerGI [phone]

## 2018-08-02 NOTE — Progress Notes (Addendum)
PHARMACY - PHYSICIAN COMMUNICATION CRITICAL VALUE ALERT - BLOOD CULTURE IDENTIFICATION (BCID)  Luis Cisneros is an 78 y.o. male who presented to [facility] 07[facility] with a chief complaint of weakness, AMS, and anasarca in setting of recent PE/DVT. Hemodynamically unstable requiring pressors this admission.  Assessment:  1/4 bottles methicillin-resistant CoNS, source unknown  Name of physician (or Provider) Contacted: Wert  Current antibiotics: Vancomycin, Primaxin  Changes to prescribed antibiotics recommended: continue current abx; usually attributable to contamination but patient appears septic   Results for orders placed or performed during the hospital encounter of 07/18/2018  Blood Culture ID Panel (Reflexed) (Collected: 08/01/2018  6:49 PM)  Result Value Ref Range   Enterococcus species NOT DETECTED NOT DETECTED   Listeria monocytogenes NOT DETECTED NOT DETECTED   Staphylococcus species DETECTED (A) NOT DETECTED   Staphylococcus aureus (BCID) NOT DETECTED NOT DETECTED   Methicillin resistance DETECTED (A) NOT DETECTED   Streptococcus species NOT DETECTED NOT DETECTED   Streptococcus agalactiae NOT DETECTED NOT DETECTED   Streptococcus pneumoniae NOT DETECTED NOT DETECTED   Streptococcus pyogenes NOT DETECTED NOT DETECTED   Acinetobacter baumannii NOT DETECTED NOT DETECTED   Enterobacteriaceae species NOT DETECTED NOT DETECTED   Enterobacter cloacae complex NOT DETECTED NOT DETECTED   Escherichia coli NOT DETECTED NOT DETECTED   Klebsiella oxytoca NOT DETECTED NOT DETECTED   Klebsiella pneumoniae NOT DETECTED NOT DETECTED   Proteus species NOT DETECTED NOT DETECTED   Serratia marcescens NOT DETECTED NOT DETECTED   Haemophilus influenzae NOT DETECTED NOT DETECTED   Neisseria meningitidis NOT DETECTED NOT DETECTED   Pseudomonas aeruginosa NOT DETECTED NOT DETECTED   Candida albicans NOT DETECTED NOT DETECTED   Candida glabrata NOT DETECTED NOT DETECTED   Candida krusei NOT  DETECTED NOT DETECTED   Candida parapsilosis NOT DETECTED NOT DETECTED   Candida tropicalis NOT DETECTED NOT DETECTED    Cayce Paschal A 08/02/2018  6:29 PM

## 2018-08-02 NOTE — Progress Notes (Signed)
Phenylephrine drip conc changed to quad. strength per pt's RN request Alyson Ingles).  Dia Sitter, PharmD, BCPS 08/02/2018 10:54 AM

## 2018-08-02 NOTE — Progress Notes (Signed)
HEMATOLOGY-ONCOLOGY PROGRESS NOTE  SUBJECTIVE: Patient does not appear to be alert or oriented.  He appears to be markedly short of breath.  He was not answering any questions. I reviewed the chart in detail. Extensive DVTs PEs and infarcts on Lovenox Currently on blood pressure support  OBJECTIVE: REVIEW OF SYSTEMS:   Not obtainable because the patient is not answering questions  PHYSICAL EXAMINATION: ECOG PERFORMANCE STATUS: 4 - Bedbound  Vitals:   08/02/18 0630 08/02/18 0800  BP:  (!) 101/43  Pulse: (!) 117 (!) 115  Resp: (!) 25 (!) 25  Temp:    SpO2: 94% 95%   Filed Weights   07/31/18 0418 08/01/18 0500 08/02/18 0500  Weight: 186 lb 15.2 oz (84.8 kg) 190 lb 0.6 oz (86.2 kg) 199 lb 1.2 oz (90.3 kg)    GENERAL:alert, no distress and comfortable LUNGS: Tachypnea HEART: Tachycardia ABDOMEN:abdomen soft, non-tender and normal bowel sounds Musculoskeletal:no cyanosis of digits and no clubbing  NEURO: Moving all extremities.  Not oriented Extremities: Very mild edema  LABORATORY DATA:  I have reviewed the data as listed CMP Latest Ref Rng & Units 08/01/2018 08/01/2018 07/31/2018  Glucose 70 - 99 mg/dL 47(L) 71 89  BUN 8 - 23 mg/dL 44(H) 27(H) 21  Creatinine 0.61 - 1.24 mg/dL 1.26(H) 0.74 0.67  Sodium 135 - 145 mmol/L 135 133(L) 129(L)  Potassium 3.5 - 5.1 mmol/L 4.7 3.8 4.0  Chloride 98 - 111 mmol/L 106 104 100  CO2 22 - 32 mmol/L 12(L) 17(L) 19(L)  Calcium 8.9 - 10.3 mg/dL 7.3(L) 7.7(L) 7.9(L)  Total Protein 6.5 - 8.1 g/dL 4.3(L) 4.8(L) 5.0(L)  Total Bilirubin 0.3 - 1.2 mg/dL 2.8(H) 2.6(H) 2.2(H)  Alkaline Phos 38 - 126 U/L 163(H) 178(H) 203(H)  AST 15 - 41 U/L 133(H) 102(H) 101(H)  ALT 0 - 44 U/L 32 32 34    Lab Results  Component Value Date   WBC 20.0 (H) 08/01/2018   HGB 9.5 (L) 08/01/2018   HCT 31.9 (L) 08/01/2018   MCV 90.1 08/01/2018   PLT 30 (L) 08/01/2018   NEUTROABS 11.4 (H) 08/01/2018    ASSESSMENT AND PLAN: 1.  Extensive DVTs and PE: Currently on  Lovenox injections 2.  Severe thrombocytopenia: Consumption coagulopathy.  DIC panel done yesterday revealed INR of 1.7 with a fibrinogen of 241 PT and PTT are prolonged. He could still have low-grade DIC.  For which anticoagulation is a treatment. No schistocytes so there is no concern for TTP/HUS. Prior lupus anticoagulant testing was negative. Plan to give him platelets today 3.  Leukocytosis with hypotension: Possibly underlying sepsis 4.  Splenomegaly with elevated LFTs: He may have an infiltrative disease like lymphoma.  However biopsy is not feasible given his current health.  Plan: platelet transfusion today.  I discussed this with his wife today.  I stressed the fact that he may be slipping away in terms of his overall prognosis.  She fully understood the severity of his health and her only request was to be able to see him.  I will ask our nursing staff to see if that is feasible.

## 2018-08-02 NOTE — Consult Note (Deleted)
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 intravasc volume status so PCCM service consulted 6/26 pm   HISTORY OF PRESENT ILLNESS   Patient was admitted 6/12-06/16/2020 with shortness of breath and fatigue and was found to have bilateral DVTs and a large PE in the left lung, concern for developing pulmonary infarct distal to the embolus.CT of abdomen and pelvis showed Mild left hydronephrosis is noted secondary to 7 mm calculus in proximal left ureter  And 2D echo 6/13 was negative for right heart strain, LVEF 55-60%, mildly enlarged RV, IVC >50% respiratory variability. Hypercoaguable work-up 6/12 was notable for decreased protein S and protein C. Started on Xarelto  He was readmitted 6/21 to 6/23 with weakness, confusion, fever, and failure to thrive and he was found to have elevated LFTs with worsening thrombocytopenia.    Main c/o is weakness / confusion - deneis cp/ cough or even sob   SIGNIFICANT PAST MEDICAL HISTORY    HTN, hyperlipidemia, depression, hypercoagulability,  SIGNIFICANT EVENTS:  6/27 am did not respond to fluids/ alb bolus so placed on neo  STUDIES:   Echo cardiogram 6/26 >  Unremarkable with RAP est  10 mmhg and nl RV /LV fxn  CULTURES:  [facility] 07/17/2018 >>> 1/2 micrococcus Luteus/ Lylae  Corona virus pcr 07/29/2018 neg  MRSA screening 07/16/2018 neg  BC 6/26 x 2 >>>  ANTIBIOTICS:  rafixamin 6/24 > 6/25   LINES/TUBES:  L radial Art line 6/27    CONSULTANTS:  Hematology GI   Cardiology 6/26  PCCM   6/26   SUBJECTIVE:  Knows name / no other specificis  CONSTITUTIONAL: BP (!) 65/46   Pulse (!) 117   Temp  (!) 97.4 F (36.3 C) (Axillary) Comment: RN notified  Resp (!) 25   Ht _0  (1.778 m)   Wt 90.3 kg   SpO2 94%   BMI 28.56 kg/m      Intake/Output Summary (Last 24 hours) at 08/02/2018 0841 Last data filed at 08/02/2018 0700 Gross per 24 hour  Intake 2351.24 ml  Output 585 ml  Net 1766.24 ml         PHYSICAL EXAM:  Pt alert, confused s specific complaints No jvd Oropharynx clear,  mucosa nl/ poor dentition Neck supple Lungs with a few scattered exp > insp rhonchi bilaterally RRR no s3 or or sign murmur Abd mod distended  Extr warm with trace edema  - no clubbing Neuro  Moving all 4 / oriented only to person    RESOLVED PROBLEM LIST   ASSESSMENT AND PLAN    1) 02 dep resp failure s/p PE with infarcts but no evidence at all to support TEPAH or any R ht failure >>> rx is keep sats high 90s  2) low bp/  tachycardia ? Sepsis  - agree with Dr Alba Cory he's likley intravascularly vol depleted related to low albumin so may need volume expansion,   pending echo of course (no need for invasive monitoring at this point or certainly not RHC esp with plt so low on lovenox rx per heme >  rec check cortisol/pct  > continue low dose neo until we sort this out   3) AMS c/w TME / liver dz > on chronulac per GI   4) Multiple pulmonary nodules  Too small for PET or bx, not suspicious enough or practical at this point to attempt any kind of bx  > really only option for now is follow the Fleischner society guidelines as rec by radiology - I doubt this has anything to do with his presentation but continue to be concerned about possible met ca here.  5) Progressive met acidosis with with AG = lactic acidosis assoc with hypotension and worsening renal function c/w MODS  In pt with low plt and splenomegaly that is not likely due to passive congestion based on echo 6/26 - do see any kind of a unifying dx here but will send pct/ cortisol level today  - Heme onc following ? Some form of TTP  (absence of MAHE is only thing we're missing here) or is this dic?     LABS  Glucose Recent Labs  Lab 07/17/2018 1730 08/02/18 0435 08/02/18 0450 08/02/18 0506 08/02/18 0522 08/02/18 0552  GLUCAP 103* 21* 67* 76 96 100*    BMET Recent Labs  Lab 07/31/18 0212 08/01/18 0217 08/01/18 2350  NA 129* 133* 135  K 4.0 3.8 4.7  CL 100 104 106  CO2 19* 17* 12*  BUN 21 27* 44*  CREATININE 0.67 0.74 1.26*  GLUCOSE 89 71 47*    Liver Enzymes Recent Labs  Lab 07/31/18 0212 08/01/18 0217 08/01/18 2350  AST 101* 102* 133*  ALT 34 32 32  ALKPHOS 203* 178* 163*  BILITOT 2.2* 2.6* 2.8*  ALBUMIN 1.7* 1.7* 1.6*    Electrolytes Recent Labs  Lab 07/27/18 1146  07/29/18 0352  07/31/18 0212 08/01/18 0217 08/01/18 2350  CALCIUM 9.1   < > 8.0*   < > 7.9* 7.7* 7.3*  MG 2.0  --  1.9  --  2.1  --   --   PHOS  --   --  2.0*  --  1.7*  --   --    < > = values in this interval not displayed.    CBC Recent Labs  Lab 07/31/18 2249 08/01/18 0217 08/01/18 1035 08/01/18 2350  WBC 13.3* 14.2*  --  20.0*  HGB 9.8* 9.6*  --  9.5*  HCT 31.1* 31.4*  --  31.9*  PLT 44* 36* 34* 30*    ABG Recent Labs  Lab 07/28/18 1052 08/01/18 1508 08/02/18 0425  PHART 7.472* 7.379 7.345*  PCO2ART 33.4 26.2* 21.0*  PO2ART 69.1* 71.9* 65.5*    Coag's Recent Labs  Lab 07/27/18 1529  07/28/18 1608 07/29/18 0352 08/02/2018 1709 08/01/18 1035  APTT 49*   < > 197* 92*  --  61*  INR 1.7*  --   --   --  1.5* 1.7*   < > = values in this interval not displayed.    Sepsis Markers Recent Labs  Lab 08/01/18 1803 08/02/18 0130 08/02/18 0642  LATICACIDVEN 7.0* 8.4* 8.7*     The patient is critically ill with multiple organ systems failure and requires high complexity decision making for assessment and support, frequent evaluation and titration of therapies, application of advanced monitoring technologies and extensive interpretation of multiple databases. Critical Care Time devoted to patient  care services described in this note is 45 minutes.      Christinia Gully, MD [facility]  Cell [phone] After 5:30 PM or weekends, use Beeper [phone]

## 2018-08-02 NOTE — Progress Notes (Signed)
Hypoglycemic Event  CBG: 55  Treatment: D50 50 mL (25 gm)  Symptoms: Nervous/irritable  Follow-up CBG: Time:1711 CBG Result:114  Possible Reasons for Event: Inadequate meal intake  Comments/MD notified:Dr. Collene Mares

## 2018-08-02 NOTE — Progress Notes (Signed)
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 intravasc volume status so PCCM service consulted 6/26 pm   HISTORY OF PRESENT ILLNESS   Patient was admitted 6/12-06/16/2020 with shortness of breath and fatigue and was found to have bilateral DVTs and a large PE in the left lung, concern for developing pulmonary infarct distal to the embolus.CT of abdomen and pelvis showed Mild left hydronephrosis is noted secondary to 7 mm calculus in proximal left ureter  And 2D echo 6/13 was negative for right heart strain, LVEF 55-60%, mildly enlarged RV, IVC >50% respiratory variability. Hypercoaguable work-up 6/12 was notable for decreased protein S and protein C. Started on Xarelto  He was readmitted 6/21 to 6/23 with weakness, confusion, fever, and failure to thrive and he was found to have elevated LFTs with worsening thrombocytopenia.    Main c/o is weakness / confusion - deneis cp/ cough or even sob   SIGNIFICANT PAST MEDICAL HISTORY    HTN, hyperlipidemia, depression, hypercoagulability,  SIGNIFICANT EVENTS:  6/27 am did not respond to fluids/ alb bolus so placed on neo  STUDIES:   Echo cardiogram 6/26 >  Unremarkable with RAP est  10 mmhg and nl RV /LV fxn  CULTURES:  [facility] 07/28/2018 >>> 1/2 micrococcus Luteus/ Lylae  Corona virus pcr 08/01/2018 neg  MRSA screening 07/26/2018 neg  BC 6/26 x 2 >>>  ANTIBIOTICS:  rafixamin 6/24 > 6/25   LINES/TUBES:  L radial Art line 6/27    CONSULTANTS:  Hematology GI   Cardiology 6/26  PCCM   6/26   SUBJECTIVE:  Knows name / no other specificis  CONSTITUTIONAL: BP (!) 101/43   Pulse (!) 115   Temp  (!) 97.4 F (36.3 C) (Axillary) Comment: RN notified  Resp (!) 25   Ht '5\' 10"'$  (1.778 m)   Wt 90.3 kg   SpO2 95%   BMI 28.56 kg/m      Intake/Output Summary (Last 24 hours) at 08/02/2018 1941 Last data filed at 08/02/2018 0700 Gross per 24 hour  Intake 2351.24 ml  Output 585 ml  Net 1766.24 ml         PHYSICAL EXAM:  Pt alert, confused s specific complaints No jvd Oropharynx clear,  mucosa nl/ poor dentition Neck supple Lungs with a few scattered exp > insp rhonchi bilaterally RRR no s3 or or sign murmur Abd mod distended  Extr warm with trace edema  - no clubbing Neuro  Moving all 4 / oriented only to person    RESOLVED PROBLEM LIST   ASSESSMENT AND PLAN    1) 02 dep resp failure s/p PE with infarcts but no evidence at all to support TEPAH or any R ht failure >>> rx is keep sats high 90s  2) low bp/  tachycardia ? Sepsis  - agree with Dr Alba Cory he's likley intravascularly vol depleted related to low albumin so may need volume expansion,   pending echo of course (no need for invasive monitoring at this point or certainly not RHC esp with plt so low on lovenox rx per heme >  rec check cortisol/pct  > continue low dose neo until we sort this out   3) AMS c/w TME / liver dz > on chronulac per GI   4) Multiple pulmonary nodules  Too small for PET or bx, not suspicious enough or practical at this point to attempt any kind of bx  > really only option for now is follow the Fleischner society guidelines as rec by radiology - I doubt this has anything to do with his presentation but continue to be concerned about possible met ca here.  5) Progressive met acidosis with with AG = lactic acidosis assoc with hypotension and worsening renal function c/w MODS  In pt with low plt and splenomegaly that is not likely due to passive congestion based on echo 6/26 - do see any kind of a unifying dx here but will send pct/ cortisol level today  - Heme onc following ? Some form of TTP  (absence of MAHE is only thing we're missing here) or is this dic?     LABS  Glucose Recent Labs  Lab 07/29/2018 1730 08/02/18 0435 08/02/18 0450 08/02/18 0506 08/02/18 0522 08/02/18 0552  GLUCAP 103* 21* 67* 76 96 100*    BMET Recent Labs  Lab 07/31/18 0212 08/01/18 0217 08/01/18 2350  NA 129* 133* 135  K 4.0 3.8 4.7  CL 100 104 106  CO2 19* 17* 12*  BUN 21 27* 44*  CREATININE 0.67 0.74 1.26*  GLUCOSE 89 71 47*    Liver Enzymes Recent Labs  Lab 07/31/18 0212 08/01/18 0217 08/01/18 2350  AST 101* 102* 133*  ALT 34 32 32  ALKPHOS 203* 178* 163*  BILITOT 2.2* 2.6* 2.8*  ALBUMIN 1.7* 1.7* 1.6*    Electrolytes Recent Labs  Lab 07/27/18 1146  07/29/18 0352  07/31/18 0212 08/01/18 0217 08/01/18 2350  CALCIUM 9.1   < > 8.0*   < > 7.9* 7.7* 7.3*  MG 2.0  --  1.9  --  2.1  --   --   PHOS  --   --  2.0*  --  1.7*  --   --    < > = values in this interval not displayed.    CBC Recent Labs  Lab 07/31/18 2249 08/01/18 0217 08/01/18 1035 08/01/18 2350  WBC 13.3* 14.2*  --  20.0*  HGB 9.8* 9.6*  --  9.5*  HCT 31.1* 31.4*  --  31.9*  PLT 44* 36* 34* 30*    ABG Recent Labs  Lab 07/28/18 1052 08/01/18 1508 08/02/18 0425  PHART 7.472* 7.379 7.345*  PCO2ART 33.4 26.2* 21.0*  PO2ART 69.1* 71.9* 65.5*    Coag's Recent Labs  Lab 07/27/18 1529  07/28/18 1608 07/29/18 0352 07/28/2018 1709 08/01/18 1035  APTT 49*   < > 197* 92*  --  61*  INR 1.7*  --   --   --  1.5* 1.7*   < > = values in this interval not displayed.    Sepsis Markers Recent Labs  Lab 08/01/18 1803 08/02/18 0130 08/02/18 0642  LATICACIDVEN 7.0* 8.4* 8.7*     The patient is critically ill with multiple organ systems failure and requires high complexity decision making for assessment and support, frequent evaluation and titration of therapies, application of advanced monitoring technologies and extensive interpretation of multiple databases. Critical Care Time devoted to patient  care services described in this note is 45 minutes.      Christinia Gully, MD Pulmonary and [place]  Cell [phone] After 5:30 PM or weekends, use Beeper [phone]

## 2018-08-02 NOTE — Progress Notes (Signed)
Wedding ring was taken off and given to wife.

## 2018-08-02 NOTE — Progress Notes (Signed)
Informed pt wife that pt needed an Arterial line for more accurate blood pressures (wife gave consent to place line verified by another RN).  Also, informed her that pt was now on medication to support his blood pressure.

## 2018-08-02 NOTE — Progress Notes (Addendum)
Pharmacy Antibiotic Note  Luis Cisneros is a 78 y.o. male admitted on 07/23/2018 with bacteremia. Pharmacy has been consulted for Primaxin and vancomycin dosing.   Plan:  Primaxin 500 mg IV q8 hr Vancomycin 1500 mg IV now, then 1000 mg IV q24 hr (est AUC 459 based on SCr 1.79, Vd 0.72)  Measure vancomycin AUC at steady state as indicated  SCr q48 hr while on vanc  Height: 5\' 10"  (177.8 cm) Weight: 199 lb 1.2 oz (90.3 kg) IBW/kg (Calculated) : 73  Temp (24hrs), Avg:98.9 F (37.2 C), Min:97.4 F (36.3 C), Max:100.5 F (38.1 C)  Recent Labs  Lab 07/13/2018 1709  07/31/18 0212  07/31/18 2249 08/01/18 0217  08/01/18 1535 08/01/18 1803 08/01/18 2350 08/02/18 0130 08/02/18 0642 08/02/18 1040  WBC 12.0*  --  12.0*  --  13.3* 14.2*  --   --   --  20.0*  --   --  29.1*  CREATININE 0.78  --  0.67  --   --  0.74  --   --   --  1.26*  --   --  1.79*  LATICACIDVEN 2.8*   < >  --    < >  --   --    < > 7.1* 7.0*  --  8.4* 8.7* 8.6*   < > = values in this interval not displayed.    Estimated Creatinine Clearance: 38.4 mL/min (A) (by C-G formula based on SCr of 1.79 mg/dL (H)).    Allergies  Allergen Reactions  . Cephalexin Nausea Only  . Feldene [Piroxicam] Hives, Nausea Only and Other (See Comments)    Makes pt feel loopy   Antimicrobials this admission: 6/27 vanc >>  6/27 Primaxin >>   Dose adjustments this admission: n/a  Microbiology results: 6/24 BCx: micrococcus luteus 1/4 bottles 6/26 BCx: GPCs in clusters 1/4 bottles   Thank you for allowing pharmacy to be a part of this patient's care.  Skylah Delauter A 08/02/2018 4:52 PM

## 2018-08-02 NOTE — Procedures (Signed)
Discussed risk (in pt on lovenox, plt 40K) with wife at bedside   Sterile prep/drape/ 1% xylocaine  L SCVein access first stick   #18 gauge tripple lumen passed to 20 cm s difficulty with good blood return   F/u cxr pending/ pt tol well   Wife informed.   Christinia Gully, MD Pulmonary and [place] [phone] After 5:30 PM or weekends, use Beeper [phone]

## 2018-08-03 LAB — CBC WITH DIFFERENTIAL/PLATELET
Abs Immature Granulocytes: 0.65 10*3/uL — ABNORMAL HIGH (ref 0.00–0.07)
Basophils Absolute: 0.1 10*3/uL (ref 0.0–0.1)
Basophils Relative: 1 %
Eosinophils Absolute: 0 10*3/uL (ref 0.0–0.5)
Eosinophils Relative: 0 %
HCT: 29.3 % — ABNORMAL LOW (ref 39.0–52.0)
Hemoglobin: 8.6 g/dL — ABNORMAL LOW (ref 13.0–17.0)
Immature Granulocytes: 3 %
Lymphocytes Relative: 18 %
Lymphs Abs: 4 10*3/uL (ref 0.7–4.0)
MCH: 26.2 pg (ref 26.0–34.0)
MCHC: 29.4 g/dL — ABNORMAL LOW (ref 30.0–36.0)
MCV: 89.3 fL (ref 80.0–100.0)
Monocytes Absolute: 9.2 10*3/uL — ABNORMAL HIGH (ref 0.1–1.0)
Monocytes Relative: 41 %
Neutro Abs: 8.4 10*3/uL — ABNORMAL HIGH (ref 1.7–7.7)
Neutrophils Relative %: 37 %
Platelets: 22 10*3/uL — CL (ref 150–400)
RBC: 3.28 MIL/uL — ABNORMAL LOW (ref 4.22–5.81)
RDW: 17 % — ABNORMAL HIGH (ref 11.5–15.5)
WBC: 22.4 10*3/uL — ABNORMAL HIGH (ref 4.0–10.5)
nRBC: 7.8 % — ABNORMAL HIGH (ref 0.0–0.2)

## 2018-08-03 LAB — EPSTEIN-BARR VIRUS VCA, IGM: EBV VCA IgM: <36 U/mL (ref 0.0–35.9)

## 2018-08-03 LAB — GLUCOSE, CAPILLARY
Glucose-Capillary: 179 mg/dL — ABNORMAL HIGH (ref 70–99)
Glucose-Capillary: 222 mg/dL — ABNORMAL HIGH (ref 70–99)
Glucose-Capillary: 185 mg/dL — ABNORMAL HIGH (ref 70–99)
Glucose-Capillary: 142 mg/dL — ABNORMAL HIGH (ref 70–99)

## 2018-08-03 LAB — CULTURE, BLOOD (ROUTINE X 2): Special Requests: ADEQUATE

## 2018-08-03 LAB — COMPREHENSIVE METABOLIC PANEL
ALT: 186 U/L — ABNORMAL HIGH (ref 0–44)
AST: 964 U/L — ABNORMAL HIGH (ref 15–41)
Albumin: 1.6 g/dL — ABNORMAL LOW (ref 3.5–5.0)
Alkaline Phosphatase: 158 U/L — ABNORMAL HIGH (ref 38–126)
Anion gap: 10 (ref 5–15)
BUN: 66 mg/dL — ABNORMAL HIGH (ref 8–23)
CO2: 14 mmol/L — ABNORMAL LOW (ref 22–32)
Calcium: 6.8 mg/dL — ABNORMAL LOW (ref 8.9–10.3)
Chloride: 114 mmol/L — ABNORMAL HIGH (ref 98–111)
Creatinine, Ser: 1.96 mg/dL — ABNORMAL HIGH (ref 0.61–1.24)
GFR calc Af Amer: 37 mL/min — ABNORMAL LOW (ref >60)
GFR calc non Af Amer: 32 mL/min — ABNORMAL LOW (ref >60)
Glucose, Bld: 211 mg/dL — ABNORMAL HIGH (ref 70–99)
Potassium: 4.8 mmol/L (ref 3.5–5.1)
Sodium: 138 mmol/L (ref 135–145)
Total Bilirubin: 3.9 mg/dL — ABNORMAL HIGH (ref 0.3–1.2)
Total Protein: 4.2 g/dL — ABNORMAL LOW (ref 6.5–8.1)

## 2018-08-03 LAB — PREPARE PLATELET PHERESIS: Unit division: 0

## 2018-08-03 LAB — PLATELET COUNT: Platelets: 18 10*3/uL — CL (ref 150–400)

## 2018-08-03 LAB — BPAM PLATELET PHERESIS
Blood Product Expiration Date: 202006292359
ISSUE DATE / TIME: 202006271122
Unit Type and Rh: 6200

## 2018-08-03 LAB — PROCALCITONIN: Procalcitonin: 8.2 ng/mL

## 2018-08-03 MED ORDER — IMMUNE GLOBULIN (HUMAN) 20 GM/200ML IV SOLN: 400.0000 mg/kg | INTRAVENOUS | Status: DC

## 2018-08-03 MED ORDER — DEXTROSE-NACL 5-0.45 % IV SOLN: INTRAVENOUS | Status: DC

## 2018-08-03 MED ORDER — CHLORHEXIDINE GLUCONATE CLOTH 2 % EX PADS
6.0000 | MEDICATED_PAD | Freq: Every day | CUTANEOUS | Status: DC
  Administered 2018-08-03 – 2018-08-06 (×4): 6 via TOPICAL

## 2018-08-03 MED ORDER — INSULIN ASPART 100 UNIT/ML ~~LOC~~ SOLN
0.0000 [IU] | SUBCUTANEOUS | Status: DC
  Administered 2018-08-03: 2 [IU] via SUBCUTANEOUS
  Administered 2018-08-03 – 2018-08-04 (×4): 1 [IU] via SUBCUTANEOUS
  Administered 2018-08-05 – 2018-08-06 (×12): 2 [IU] via SUBCUTANEOUS

## 2018-08-03 MED ORDER — SODIUM CHLORIDE 0.9 % IV SOLN
0.0000 ug/min | INTRAVENOUS | Status: DC
  Administered 2018-08-03: 10 ug/min via INTRAVENOUS
  Filled 2018-08-03 (×2): qty 4

## 2018-08-03 MED ORDER — SODIUM CHLORIDE 0.9% FLUSH: 10.0000 mL | INTRAVENOUS | Status: DC | PRN

## 2018-08-03 MED ORDER — IMMUNE GLOBULIN (HUMAN) 5 GM/50ML IV SOLN
400.0000 mg/kg | INTRAVENOUS | Status: AC
  Administered 2018-08-03 – 2018-08-06 (×4): 35 g via INTRAVENOUS
  Filled 2018-08-03 (×4): qty 50

## 2018-08-03 MED ORDER — SODIUM CHLORIDE 0.9% FLUSH
10.0000 mL | Freq: Two times a day (BID) | INTRAVENOUS | Status: DC
  Administered 2018-08-03 (×2): 10 mL

## 2018-08-03 MED ORDER — SODIUM CHLORIDE 0.9% IV SOLUTION
Freq: Once | INTRAVENOUS | Status: AC
  Administered 2018-08-03: 12:00:00 via INTRAVENOUS

## 2018-08-03 MED ORDER — SODIUM BICARBONATE 8.4 % IV SOLN
INTRAVENOUS | Status: DC
  Administered 2018-08-03: 10:00:00 via INTRAVENOUS
  Filled 2018-08-03 (×4): qty 50

## 2018-08-03 NOTE — Progress Notes (Signed)
Nutrition Follow-up  INTERVENTION:   -Will monitor for diet advancement and order supplements at that time  NUTRITION DIAGNOSIS:   Increased nutrient needs related to acute illness, other (see comment)(multiple hospitalizations in 2 weeks) as evidenced by estimated needs.  Ongoing.  GOAL:   Patient will meet greater than or equal to 90% of their needs  Not meeting  MONITOR:   Diet advancement, PO intake, Labs, Weight trends, GOC  REASON FOR ASSESSMENT:   Consult Poor PO  ASSESSMENT:   78 year-old male with medical history significant for recently diagnosed pulmonary embolus and BLE DVTs on Xarelto, HTN, hyperlipidemia, depression, and hard of hearing. He presented to the ED for evaluation of generalized weakness, confusion, abdominal distention. Patient was recently admitted from 6/12-6/16 for a L segmental pulmonary embolus and BLE DVTs. He was discharged on home supplemental oxygen due to persistent hypoxia while admitted. He was re-admitted 6/21-6/23 for generalized weakness/confusion and lethargy. Repeat CTA chest showed persistent known PE. He returned to the ED on 6/24 per PCP recommendation.  **RD working remotely**  Initial assessment completed 6/25.  Patient continues to be confused per chart review. Continues to be NPO, weaning off pressors today. Will continue to monitor for ability to order protein supplements given prolonged poor PO.  Per weight records, weights are trending up. Per I/Os: +8L since admit.  Medications reviewed. Labs reviewed: CBGs: 179 GFR: 32  NUTRITION - FOCUSED PHYSICAL EXAM:  Unable to perform-working remotely.  Diet Order:   Diet Order            Diet NPO time specified Except for: Sips with Meds  Diet effective now              EDUCATION NEEDS:   Not appropriate for education at this time  Skin:  Skin Assessment: Reviewed RN Assessment  Last BM:  PTA/unknown  Height:   Ht Readings from Last 1 Encounters:   07/23/2018 5\' 10"  (1.778 m)    Weight:   Wt Readings from Last 1 Encounters:  08/03/18 91 kg    Ideal Body Weight:  75.4 kg  BMI:  Body mass index is 28.79 kg/m.  Estimated Nutritional Needs:   Kcal:  1638-4665 kcal  Protein:  102-120 grams  Fluid:  >/= 2.1 L/day  Clayton Bibles, MS, RD, LDN [place] Dietitian Pager: [phone] After Hours Pager: [phone]

## 2018-08-03 NOTE — Progress Notes (Signed)
HEMATOLOGY-ONCOLOGY PROGRESS NOTE  SUBJECTIVE:Continuing problems with platelets and Blood pressure issues. Rapidly rising LFTs. Patient is awake but confused. Very seriously ill in critical condition  OBJECTIVE: REVIEW OF SYSTEMS:   Not obtained because of confusion  PHYSICAL EXAMINATION: ECOG PERFORMANCE STATUS: 4 - Bedbound  Vitals:   08/03/18 0800 08/03/18 0847  BP:    Pulse: (!) 105   Resp: 19   Temp:  97.9 F (36.6 C)  SpO2:     Filed Weights   08/01/18 0500 08/02/18 0500 08/03/18 0500  Weight: 190 lb 0.6 oz (86.2 kg) 199 lb 1.2 oz (90.3 kg) 200 lb 9.9 oz (91 kg)   Exam: Not performed LABORATORY DATA:  I have reviewed the data as listed CMP Latest Ref Rng & Units 08/03/2018 08/02/2018 08/01/2018  Glucose 70 - 99 mg/dL 211(H) 87 47(L)  BUN 8 - 23 mg/dL 66(H) 55(H) 44(H)  Creatinine 0.61 - 1.24 mg/dL 1.96(H) 1.79(H) 1.26(H)  Sodium 135 - 145 mmol/L 138 136 135  Potassium 3.5 - 5.1 mmol/L 4.8 5.1 4.7  Chloride 98 - 111 mmol/L 114(H) 107 106  CO2 22 - 32 mmol/L 14(L) 13(L) 12(L)  Calcium 8.9 - 10.3 mg/dL 6.8(L) 7.2(L) 7.3(L)  Total Protein 6.5 - 8.1 g/dL 4.2(L) - 4.3(L)  Total Bilirubin 0.3 - 1.2 mg/dL 3.9(H) - 2.8(H)  Alkaline Phos 38 - 126 U/L 158(H) - 163(H)  AST 15 - 41 U/L 964(H) - 133(H)  ALT 0 - 44 U/L 186(H) - 32    Lab Results  Component Value Date   WBC 22.4 (H) 08/03/2018   HGB 8.6 (L) 08/03/2018   HCT 29.3 (L) 08/03/2018   MCV 89.3 08/03/2018   PLT 22 (LL) 08/03/2018   NEUTROABS 8.4 (H) 08/03/2018    ASSESSMENT AND PLAN: 1. Worsening thrombocytopenia: Will give IVIG and platelets today 2. Worsening LFTS: suspected to be due to hypo-perfusion of liver. On pressors 3. Anemia and Leucocytosis: due to acute illness 4. DVT and PE: Ok to convert to Heparin if needed  Spoke to patients wife and she agreed to IVIG and platelets. Explained to her that hes in very critical condition.

## 2018-08-03 NOTE — Progress Notes (Signed)
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 intravasc volume status so PCCM service consulted 6/26 pm   HISTORY OF PRESENT ILLNESS   Patient was admitted 6/12-06/16/2020 with shortness of breath and fatigue and was found to have bilateral DVTs and a large PE in the left lung, concern for developing pulmonary infarct distal to the embolus.CT of abdomen and pelvis showed Mild left hydronephrosis is noted secondary to 7 mm calculus in proximal left ureter  And 2D echo 6/13 was negative for right heart strain, LVEF 55-60%, mildly enlarged RV, IVC >50% respiratory variability. Hypercoaguable work-up 6/12 was notable for decreased protein S and protein C. Started on Xarelto  He was readmitted 6/21 to 6/23 with weakness, confusion, fever, and failure to thrive and he was found to have elevated LFTs with worsening thrombocytopenia.    Main c/o is weakness / confusion - deneid cp/ cough or even sob   SIGNIFICANT PAST MEDICAL HISTORY    HTN, hyperlipidemia, depression, hypercoagulability,  SIGNIFICANT EVENTS:  6/27 am did not respond to fluids/ alb bolus so placed on neo then levophed/versed and stress HC  STUDIES:   Echo cardiogram 6/26 >  Unremarkable with RAP est  10 mmhg and nl RV /LV fxn  CULTURES/micro:  [facility] 08/01/2018 >>> 1/2 micrococcus Luteus/ Lylae  Corona virus pcr 07/08/2018 neg  MRSA screening 07/20/2018 neg  BC 6/26 x 3 >>>  2/3 growing staph species PCT  6/27 7.6 PCT 6/28  8.2     ANTIBIOTICS:  rafixamin 6/24 > 6/25 Primaxin 6/27 Vanc 6/26    LINES/TUBES:  L radial Art line 6/27  L [place] CVL  6/27    CONSULTANTS:   Hematology GI   Cardiology 6/26  PCCM   6/26   SUBJECTIVE/overnight events:  bp improved and weaning pressors, more alert but still not conversant - shakes head "no" when asked if in pain  CONSTITUTIONAL: BP (!) 97/39 (BP Location: Left Arm)   Pulse (!) 105   Temp 97.9 F (36.6 C) (Axillary)   Resp 19   Ht _0  (1.778 m)   Wt 91 kg   SpO2 95%   BMI 28.79 kg/m      Intake/Output Summary (Last 24 hours) at 08/03/2018 0859 Last data filed at 08/03/2018 0600 Gross per 24 hour  Intake 2298.74 ml  Output 400 ml  Net 1898.74 ml    CVP:  [7 mmHg-18 mmHg] 7 mmHg    PHYSICAL EXAM: Chronically ill wm oriented only to person, did say wife's name when she was present No increased wob at 30 degrees  No jvd Oropharynx clear,  mucosa nl Neck supple Lungs with a few scattered exp > insp rhonchi bilaterally RRR no s3 or or sign murmur Abd mild / mod distended, no focal tenderness nl excursion  Extr warm with 1+ putting edema or clubbing noted Neuro  Sensorium as above/  no apparent motor deficits       I personally reviewed images and agree with radiology impression as follows:  PCXR: pm 6/27 No pneumothorax following central line placement. Increased  LEFT lower lobe atelectasis versus consolidation.     RESOLVED PROBLEM LIST   ASSESSMENT AND PLAN    1) 02 dep resp failure s/p PE with infarcts and progressive vol loss LLL ? Infected infarct ? CAP vs HCAP >>> rx is keep sats high 90s  2) Circulatory shock with nl lv/rv complicated by MODS c/w severe sepsis   - keep cvp > 10   To help wean off pressors   3) AMS c/w TME / liver dz  And prob sepsis but ? What source  - gi following   4) Multiple pulmonary nodules  - follow for now   5) Progressive met acidosis with with AG = lactic acidosis assoc with hypotension and worsening renal function c/w MODS  In pt with low plt and splenomegaly that is not likely due to passive congestion based on echo 6/26 c/w circulatory  shock ? Septic  - note elevated trop 6/27 despite absent source other than ? stap epi?  - trending much better am 6/28 p started abx/stress steroids pm 6/27 so continue as is for now    6) elevated LFT's c/w shock liver - gi input appreciated  7) severe thrombocytopenia with elevated LDH/ splenomegaly - ? Could this be TTP  (note LDH elevation) vs underlying hematologic malignancy  - continue lovenox per Heme  8) Acute renal failure with oliguria - improved uop overnight with cvp a bit low > keep up if needed to get off pressors       LABS  Glucose Recent Labs  Lab 08/02/18 0552 08/02/18 1111 08/02/18 1545 08/02/18 1628 08/02/18 1711 08/03/18 0743  GLUCAP 100* 70 72 55* 114* 179*    BMET Recent Labs  Lab 08/01/18 2350 08/02/18 1040 08/03/18 0508  NA 135 136 138  K 4.7 5.1 4.8  CL 106 107 114*  CO2 12* 13* 14*  BUN 44* 55* 66*  CREATININE 1.26* 1.79* 1.96*  GLUCOSE 47* 87 211*    Liver Enzymes Recent Labs  Lab 08/01/18 0217 08/01/18 2350 08/03/18 0508  AST 102* 133* 964*  ALT 32 32 186*  ALKPHOS 178* 163* 158*  BILITOT 2.6* 2.8* 3.9*  ALBUMIN 1.7* 1.6* 1.6*    Electrolytes Recent Labs  Lab 07/27/18 1146  07/29/18 0352  07/31/18 0212  08/01/18 2350 08/02/18 1040 08/03/18 0508  CALCIUM 9.1   < > 8.0*   < > 7.9*   < > 7.3* 7.2* 6.8*  MG 2.0  --  1.9  --  2.1  --   --   --   --   PHOS  --   --  2.0*  --  1.7*  --   --   --   --    < > = values in this interval not displayed.    CBC Recent Labs  Lab 08/01/18 2350 08/02/18 1040 08/02/18 1722 08/03/18 0508  WBC 20.0* 29.1*  --  22.4*  HGB 9.5* 9.0*  --  8.6*  HCT 31.9* 30.0*  --  29.3*  PLT 30* 34* 40* 22*    ABG Recent Labs  Lab 07/28/18 1052 08/01/18 1508 08/02/18 0425  PHART 7.472* 7.379 7.345*  PCO2ART 33.4 26.2* 21.0*  PO2ART 69.1* 71.9* 65.5*    Coag's Recent Labs  Lab 07/27/18 1529  07/28/18 1608 07/29/18 0352 07/07/2018 1709 08/01/18 1035  APTT 49*   < > 197* 92*   --  61*  INR 1.7*  --   --   --  1.5* 1.7*   < > =  values in this interval not displayed.    Sepsis Markers Recent Labs  Lab 08/02/18 0130 08/02/18 0642 08/02/18 0919 08/02/18 1040 08/03/18 0508  LATICACIDVEN 8.4* 8.7*  --  8.6*  --   PROCALCITON  --   --  7.58  --  8.20       The patient is critically ill with multiple organ systems failure and requires high complexity decision making for assessment and support, frequent evaluation and titration of therapies, application of advanced monitoring technologies and extensive interpretation of multiple databases. Critical Care Time devoted to patient care services described in this note is 60 minutes with wife at bedside for a portion of evalutation   Christinia Gully, MD Pulmonary and [place] [phone] After 5:30 PM or weekends, use Beeper [phone]

## 2018-08-03 NOTE — Progress Notes (Addendum)
[facility] Progress Note  CC:  Anasarca, elevated LFTs and splenomegaly   Subjective: Patient is awake, conversing somewhat but confused.   Objective:  Vital signs in last 24 hours: Temp:  [97.9 F (36.6 C)-99.7 F (37.6 C)] 97.9 F (36.6 C) (06/28 0847) Pulse Rate:  [101-118] 104 (06/28 1100) Resp:  [16-29] 25 (06/28 1100) BP: (97-103)/(39-50) 97/39 (06/27 1600) SpO2:  [90 %-99 %] 90 % (06/28 1100) Arterial Line BP: (51-152)/(31-63) 126/51 (06/28 1100) Weight:  [91 kg] 91 kg (06/28 0500) Last BM Date: 08/03/18 General: ill appearing male in no immediate distress. Heart: tachycardic, no murmur. Pulm: lungs clear, decreased in the bases. Abdomen: moderately distended, nontender, + splenomegaly, hypoactive BS x 4 quads. Extremities: LEs with anasarca. Neurologic:  Awake, speech is intermittently comprehensible, equal hand grasp, moves all extremities. No asterixis.  Psych:  Calm.  Intake/Output from previous day: 06/27 0701 - 06/28 0700 In: 2298.7 [I.V.:1758.7; Blood:340; IV Piggyback:200] Out: 400 [Urine:400] Intake/Output this shift: Total I/O In: 3229.7 [I.V.:3007.9; IV Piggyback:221.9] Out: -   Lab Results: Recent Labs    08/01/18 2350 08/02/18 1040 08/02/18 1722 08/03/18 0508  WBC 20.0* 29.1*  --  22.4*  HGB 9.5* 9.0*  --  8.6*  HCT 31.9* 30.0*  --  29.3*  PLT 30* 34* 40* 22*   BMET Recent Labs    08/01/18 2350 08/02/18 1040 08/03/18 0508  NA 135 136 138  K 4.7 5.1 4.8  CL 106 107 114*  CO2 12* 13* 14*  GLUCOSE 47* 87 211*  BUN 44* 55* 66*  CREATININE 1.26* 1.79* 1.96*  CALCIUM 7.3* 7.2* 6.8*   LFT Recent Labs    08/02/18 0919 08/03/18 0508  PROT  --  4.2*  ALBUMIN  --  1.6*  AST  --  964*  ALT  --  186*  ALKPHOS  --  158*  BILITOT  --  3.9*  BILIDIR 1.9*  --    PT/INR Recent Labs    08/01/18 1035  LABPROT 19.3*  INR 1.7*   Hepatitis Panel No results for input(s): HEPBSAG, HCVAB, HEPAIGM, HEPBIGM in the last  72 hours.  Dg Chest 1 View  Result Date: 08/02/2018 CLINICAL DATA:  Central line placement EXAM: CHEST  1 VIEW COMPARISON:  Portable exam 1919 hours compared to 0237 hours FINDINGS: LEFT subclavian line with tip projecting over SVC at azygos arch confluence. Normal heart size, mediastinal contours, and pulmonary vascularity. Increased LEFT lower lobe atelectasis versus consolidation. Minimal RIGHT basilar atelectasis. Upper lungs clear. No pleural effusion or pneumothorax. Bones demineralized. IMPRESSION: No pneumothorax following central line placement. Increased LEFT lower lobe atelectasis versus consolidation. Electronically Signed   By: Lavonia Dana M.D.   On: 08/02/2018 19:52   Dg Chest Port 1 View  Result Date: 08/02/2018 CLINICAL DATA:  78 year old male with respiratory distress EXAM: PORTABLE CHEST 1 VIEW COMPARISON:  Chest radiograph dated 08/01/2018 FINDINGS: Shallow inspiration with probable bibasilar atelectasis, left greater right. Developing infiltrate is not excluded. No pleural effusion or pneumothorax. Stable cardiac silhouette. No acute osseous pathology. IMPRESSION: Shallow inspiration with bibasilar atelectasis. Developing infiltrate is not excluded. Electronically Signed   By: Anner Crete M.D.   On: 08/02/2018 03:26   Dg Chest Port 1 View  Result Date: 08/01/2018 CLINICAL DATA:  Dyspnea, tachypnea. EXAM: PORTABLE CHEST 1 VIEW COMPARISON:  Radiograph of July 30, 2018. FINDINGS: Stable cardiomediastinal silhouette. No pneumothorax is noted. Right lung is clear. Minimal left basilar subsegmental atelectasis is noted with possible  minimal left pericardial effusion. Bony thorax is unremarkable. IMPRESSION: Stable minimal left basilar subsegmental atelectasis with minimal left pleural effusion. Electronically Signed   By: Marijo Conception M.D.   On: 08/01/2018 13:24   Dg Abd Portable 2v  Result Date: 08/01/2018 CLINICAL DATA:  Abdominal distension EXAM: PORTABLE ABDOMEN - 2 VIEW  COMPARISON:  CT dated July 30, 2018 FINDINGS: The bowel gas pattern is nonobstructive. Again noted is splenomegaly. There is no pneumatosis or free air. The known left-sided ureteral stone is not well visualized on this exam. Phleboliths project over the patient's pelvis. There are end-stage degenerative changes of the right hip. The patient is status post prior total hip arthroplasty on the left. Degenerative changes are noted throughout the visualized lumbar spine. IMPRESSION: 1. Nonobstructive bowel gas pattern. 2. Chronic changes as above. Electronically Signed   By: Constance Holster M.D.   On: 08/01/2018 20:24    Assessment / Plan: 1. 78 y.o. male with elevated LFTs, initially thought to be related to right heart failure. Repeat ECHO 6/26 showed normal RV function, IVC dilated > 50%.  LEV 60-65%. Acute hepatitis panel negative. 6/27: INR 1.7. Albumin 1.6. Alk phos 158. AST 964 up from 133. ALT 186 up from 32 most likely due to hypotension, patient required Neo gtt yesterday. T. Bili 3.9. BP has stabilized and Neo was weaned off. Abd sono 6/21: gallbladder sludge, no stones, CBD 48m, hepatic steatosis vs hepatocellular dz, 2.2cm left lobe cyst. Patent portal vein. Normal liver doppler. CTAP 6/24: progressive splenomegaly suspicious for small splenic infarcts, stable liver cyst, mildly prominent lymph nodes within the gastrohepatic ligament and porta hepatis, anasarca and a small amount of pelvic ascites. -continue to monitor hepatic panel  2. Splenomegaly with infarct unclear etiology. No obvious cirrhosis. HIV negative. Unlikely viral (EBV), ? Malignancy ie: myeloproliferative disease, multiple myeloma, lymphoma.  3. Bilateral DVT/PE  Secondary to hypercoagulable disorder of unclear etiology. Protein S and Protein C deficiency.  On Lovenox. INR 1.7.  4.  Thrombocytopenia secondary to splenomegaly. PLT 30 < 34. IVIG and platelet infusion today per heme/onc. -heme/onc following -plt transfusion  as needed  5. Normocytic Anemia.  -monitor H/H -transfuse for Hg < 7-8  6. Leukocytosis: Blood cx grew  Methicillin resistance staphylococcus, on Primaxin and Vanco  7. Anasarca of unclear etiology, hypoalbuminemia, ? underlying liver disease, malignant process vs nephrotic syndrome. Received Albumin IV at 2am today.  8.  AKI. Cr. 1.96 up from 1.79. UA + '100mg'$ /dl protein, 11-20 RBC/HPF on 6/24. UA Na <10.  Further recommendations per Dr. GLyndel Safe  LOS: 4 days   CNoralyn Pick 08/03/2018, 11:43 AM     Attending physician's note   I have taken an interval history, reviewed the chart and examined the patient. I agree with the Advanced Practitioner's note, impression and recommendations.   Critically ill patient with multiorgan failure including resp failure s/p PE, circulatory failure, renal failure with increasing LFTs c/w shock liver.  Has severe thrombocytopenia with splenomegaly. Hematology following. Could have infiltrative process in addition. Not a candidate for liver Bx. Has toxic metabolic encephalopathy rather than hepatic encephalopathy.  No underlying liver cirrhosis.  Plan: -Supportive treatment for now. -Trend LFTs. -Dr. DLoletha Carrowtaking over the GI service tomorrow. -Poor overall prognosis. -D/w patient's wife over face-time.  RCarmell Austria MD LVelora HecklerGI [phone]

## 2018-08-04 ENCOUNTER — Other Ambulatory Visit: Payer: Self-pay | Admitting: *Deleted

## 2018-08-04 ENCOUNTER — Encounter (HOSPITAL_COMMUNITY): Payer: Self-pay | Admitting: Pulmonary Disease

## 2018-08-04 ENCOUNTER — Inpatient Hospital Stay (HOSPITAL_COMMUNITY): Payer: Medicare Other

## 2018-08-04 DIAGNOSIS — R945 Abnormal results of liver function studies: Secondary | ICD-10-CM

## 2018-08-04 DIAGNOSIS — E43 Unspecified severe protein-calorie malnutrition: Secondary | ICD-10-CM

## 2018-08-04 DIAGNOSIS — Z87891 Personal history of nicotine dependence: Secondary | ICD-10-CM

## 2018-08-04 DIAGNOSIS — Z86711 Personal history of pulmonary embolism: Secondary | ICD-10-CM | POA: Diagnosis not present

## 2018-08-04 DIAGNOSIS — R7881 Bacteremia: Secondary | ICD-10-CM

## 2018-08-04 DIAGNOSIS — D649 Anemia, unspecified: Secondary | ICD-10-CM | POA: Diagnosis present

## 2018-08-04 DIAGNOSIS — Z888 Allergy status to other drugs, medicaments and biological substances status: Secondary | ICD-10-CM

## 2018-08-04 DIAGNOSIS — D696 Thrombocytopenia, unspecified: Secondary | ICD-10-CM

## 2018-08-04 DIAGNOSIS — R791 Abnormal coagulation profile: Secondary | ICD-10-CM

## 2018-08-04 DIAGNOSIS — N179 Acute kidney failure, unspecified: Secondary | ICD-10-CM

## 2018-08-04 DIAGNOSIS — M6281 Muscle weakness (generalized): Secondary | ICD-10-CM | POA: Diagnosis not present

## 2018-08-04 DIAGNOSIS — B9562 Methicillin resistant Staphylococcus aureus infection as the cause of diseases classified elsewhere: Secondary | ICD-10-CM

## 2018-08-04 DIAGNOSIS — R601 Generalized edema: Secondary | ICD-10-CM

## 2018-08-04 DIAGNOSIS — Z881 Allergy status to other antibiotic agents status: Secondary | ICD-10-CM

## 2018-08-04 DIAGNOSIS — R748 Abnormal levels of other serum enzymes: Secondary | ICD-10-CM

## 2018-08-04 DIAGNOSIS — R634 Abnormal weight loss: Secondary | ICD-10-CM | POA: Diagnosis present

## 2018-08-04 LAB — PROTEIN ELECTROPHORESIS, SERUM
A/G Ratio: 0.7 (ref 0.7–1.7)
Albumin ELP: 1.7 g/dL — ABNORMAL LOW (ref 2.9–4.4)
Alpha-1-Globulin: 0.2 g/dL (ref 0.0–0.4)
Alpha-2-Globulin: 0.5 g/dL (ref 0.4–1.0)
Beta Globulin: 0.5 g/dL — ABNORMAL LOW (ref 0.7–1.3)
Gamma Globulin: 1.2 g/dL (ref 0.4–1.8)
Globulin, Total: 2.4 g/dL (ref 2.2–3.9)
M-Spike, %: 0.4 g/dL — ABNORMAL HIGH
Total Protein ELP: 4.1 g/dL — ABNORMAL LOW (ref 6.0–8.5)

## 2018-08-04 LAB — CBC WITH DIFFERENTIAL/PLATELET
Abs Immature Granulocytes: 0.26 10*3/uL — ABNORMAL HIGH (ref 0.00–0.07)
Basophils Absolute: 0.1 10*3/uL (ref 0.0–0.1)
Basophils Relative: 1 %
Eosinophils Absolute: 0 10*3/uL (ref 0.0–0.5)
Eosinophils Relative: 0 %
HCT: 27.6 % — ABNORMAL LOW (ref 39.0–52.0)
Hemoglobin: 8 g/dL — ABNORMAL LOW (ref 13.0–17.0)
Immature Granulocytes: 3 %
Lymphocytes Relative: 12 %
Lymphs Abs: 1.3 10*3/uL (ref 0.7–4.0)
MCH: 26.1 pg (ref 26.0–34.0)
MCHC: 29 g/dL — ABNORMAL LOW (ref 30.0–36.0)
MCV: 90.2 fL (ref 80.0–100.0)
Monocytes Absolute: 3.1 10*3/uL — ABNORMAL HIGH (ref 0.1–1.0)
Monocytes Relative: 29 %
Neutro Abs: 5.9 10*3/uL (ref 1.7–7.7)
Neutrophils Relative %: 55 %
Platelets: 12 10*3/uL — CL (ref 150–400)
RBC: 3.06 MIL/uL — ABNORMAL LOW (ref 4.22–5.81)
RDW: 17.3 % — ABNORMAL HIGH (ref 11.5–15.5)
WBC: 10.6 10*3/uL — ABNORMAL HIGH (ref 4.0–10.5)
nRBC: 9.5 % — ABNORMAL HIGH (ref 0.0–0.2)

## 2018-08-04 LAB — GLUCOSE, CAPILLARY
Glucose-Capillary: 117 mg/dL — ABNORMAL HIGH (ref 70–99)
Glucose-Capillary: 119 mg/dL — ABNORMAL HIGH (ref 70–99)
Glucose-Capillary: 122 mg/dL — ABNORMAL HIGH (ref 70–99)
Glucose-Capillary: 128 mg/dL — ABNORMAL HIGH (ref 70–99)
Glucose-Capillary: 129 mg/dL — ABNORMAL HIGH (ref 70–99)
Glucose-Capillary: 151 mg/dL — ABNORMAL HIGH (ref 70–99)
Glucose-Capillary: 89 mg/dL (ref 70–99)

## 2018-08-04 LAB — BPAM PLATELET PHERESIS
Blood Product Expiration Date: 202006302359
ISSUE DATE / TIME: 202006281227
Unit Type and Rh: 6200

## 2018-08-04 LAB — DIRECT ANTIGLOBULIN TEST (NOT AT ARMC)
DAT, IgG: NEGATIVE
DAT, complement: NEGATIVE

## 2018-08-04 LAB — COMPREHENSIVE METABOLIC PANEL
ALT: 140 U/L — ABNORMAL HIGH (ref 0–44)
AST: 482 U/L — ABNORMAL HIGH (ref 15–41)
Albumin: 1.4 g/dL — ABNORMAL LOW (ref 3.5–5.0)
Alkaline Phosphatase: 127 U/L — ABNORMAL HIGH (ref 38–126)
Anion gap: 10 (ref 5–15)
BUN: 70 mg/dL — ABNORMAL HIGH (ref 8–23)
CO2: 14 mmol/L — ABNORMAL LOW (ref 22–32)
Calcium: 6.8 mg/dL — ABNORMAL LOW (ref 8.9–10.3)
Chloride: 116 mmol/L — ABNORMAL HIGH (ref 98–111)
Creatinine, Ser: 1.67 mg/dL — ABNORMAL HIGH (ref 0.61–1.24)
GFR calc Af Amer: 45 mL/min — ABNORMAL LOW (ref >60)
GFR calc non Af Amer: 39 mL/min — ABNORMAL LOW (ref >60)
Glucose, Bld: 128 mg/dL — ABNORMAL HIGH (ref 70–99)
Potassium: 3.7 mmol/L (ref 3.5–5.1)
Sodium: 140 mmol/L (ref 135–145)
Total Bilirubin: 5.6 mg/dL — ABNORMAL HIGH (ref 0.3–1.2)
Total Protein: 4.5 g/dL — ABNORMAL LOW (ref 6.5–8.1)

## 2018-08-04 LAB — CULTURE, BLOOD (ROUTINE X 2)
Culture: NO GROWTH
Special Requests: ADEQUATE
Special Requests: ADEQUATE

## 2018-08-04 LAB — PREPARE PLATELET PHERESIS: Unit division: 0

## 2018-08-04 LAB — PROCALCITONIN: Procalcitonin: 7.36 ng/mL

## 2018-08-04 LAB — PROTIME-INR
INR: 2 — ABNORMAL HIGH (ref 0.8–1.2)
Prothrombin Time: 22.4 seconds — ABNORMAL HIGH (ref 11.4–15.2)

## 2018-08-04 LAB — PATHOLOGIST SMEAR REVIEW

## 2018-08-04 LAB — SEDIMENTATION RATE: Sed Rate: 9 mm/hr (ref 0–16)

## 2018-08-04 LAB — MRSA PCR SCREENING: MRSA by PCR: NEGATIVE

## 2018-08-04 MED ORDER — SODIUM CHLORIDE 0.9% IV SOLUTION
Freq: Once | INTRAVENOUS | Status: AC
  Administered 2018-08-04: 13:00:00 via INTRAVENOUS

## 2018-08-04 MED ORDER — SODIUM BICARBONATE 8.4 % IV SOLN
INTRAVENOUS | Status: DC
  Administered 2018-08-04 – 2018-08-05 (×2): via INTRAVENOUS
  Filled 2018-08-04 (×2): qty 50

## 2018-08-04 MED ORDER — "THROMBI-PAD 3""X3"" EX PADS"
1.0000 | MEDICATED_PAD | Freq: Once | CUTANEOUS | Status: AC
  Administered 2018-08-05: 1 via TOPICAL
  Filled 2018-08-04: qty 1

## 2018-08-04 MED ORDER — ORAL CARE MOUTH RINSE: 15.0000 mL | Freq: Two times a day (BID) | OROMUCOSAL | Status: DC

## 2018-08-04 MED ORDER — TRAVASOL 10 % IV SOLN
INTRAVENOUS | Status: AC
  Administered 2018-08-04: 17:00:00 via INTRAVENOUS
  Filled 2018-08-04: qty 528

## 2018-08-04 MED ORDER — BISACODYL 10 MG RE SUPP: 10.0000 mg | Freq: Every day | RECTAL | Status: DC | PRN

## 2018-08-04 MED ORDER — SODIUM CHLORIDE 0.9% IV SOLUTION
Freq: Once | INTRAVENOUS | Status: AC
  Administered 2018-08-04: 17:00:00 via INTRAVENOUS

## 2018-08-04 MED ORDER — SODIUM CHLORIDE 0.9 % IV SOLN
2.0000 g | Freq: Two times a day (BID) | INTRAVENOUS | Status: DC
  Administered 2018-08-04 – 2018-08-05 (×3): 2 g via INTRAVENOUS
  Filled 2018-08-04 (×3): qty 2

## 2018-08-04 MED ORDER — HEPARIN (PORCINE) 25000 UT/250ML-% IV SOLN
950.0000 [IU]/h | INTRAVENOUS | Status: DC
  Administered 2018-08-04: 950 [IU]/h via INTRAVENOUS
  Filled 2018-08-04: qty 250

## 2018-08-04 MED ORDER — HYDROCORTISONE NA SUCCINATE PF 100 MG IJ SOLR
50.0000 mg | Freq: Four times a day (QID) | INTRAMUSCULAR | Status: DC
  Administered 2018-08-04 – 2018-08-06 (×10): 50 mg via INTRAVENOUS
  Filled 2018-08-04 (×11): qty 2

## 2018-08-04 NOTE — Progress Notes (Addendum)
ANTICOAGULATION CONSULT NOTE - follow up Pharmacy Consult for Lovenox>>>UFH Indication: pulmonary embolus  Allergies  Allergen Reactions  . Cephalexin Nausea Only  . Feldene [Piroxicam] Hives, Nausea Only and Other (See Comments)    Makes pt feel loopy    Patient Measurements: Height: 5\' 10"  (177.8 cm) Weight: 203 lb 4.2 oz (92.2 kg) IBW/kg (Calculated) : 73   Vital Signs: Temp: 96.7 F (35.9 C) (06/29 0800) Temp Source: Axillary (06/29 0800) Pulse Rate: 107 (06/29 0600)  Labs: Recent Labs    08/01/18 1035  08/02/18 1040  08/03/18 0508 08/03/18 1630 08/04/18 0307  HGB  --    < > 9.0*  --  8.6*  --  8.0*  HCT  --    < > 30.0*  --  29.3*  --  27.6*  PLT 34*   < > 34*   < > 22* 18* 12*  APTT 61*  --   --   --   --   --   --   LABPROT 19.3*  --   --   --   --   --   --   INR 1.7*  --   --   --   --   --   --   CREATININE  --    < > 1.79*  --  1.96*  --  1.67*   < > = values in this interval not displayed.    Estimated Creatinine Clearance: 41.6 mL/min (A) (by C-G formula based on SCr of 1.67 mg/dL (H)).  Infusions:  . imipenem-cilastatin Stopped (08/04/18 0815)  . norepinephrine (LEVOPHED) Adult infusion Stopped (08/03/18 1019)  .  sodium bicarbonate  infusion 1000 mL 125 mL/hr at 08/03/18 1716  . vancomycin 1,000 mg (08/04/18 0915)  . vasopressin (PITRESSIN) infusion - *FOR SHOCK* Stopped (08/03/18 6803)    Assessment: 89 yoM recently discharged from [facility] now with weakness and lethargy. On PTA xarelto for recent PE. Switching to Lovenox per Hematology recommendations with Pharmacy to dose.   CBC: Hgb down to 8; Plt < 50 - per Hematology suspect consumptive process; HIT Ab negative Lupus anticoag negative  given platelets by heme 6/28 for low platelets  IVIG started 6/28 x 5 days by heme for low platelets  SCr: 1.67, CrCl ~ 42 ml/min  Previous anticoagulation: Xarelto 15 mg bid, LD 6/24 AM   Goal of Therapy: Anti-Xa level 0.6-1 units/ml 4hrs after LMWH  dose given  Plan:  Continue Lovenox 80 mg SQ q12 hr  Daily CBC ordered through 6/30; weekly SCr  Monitor for signs of bleeding or worsening thrombosis  Eudelia Bunch, Pharm.D 08/04/2018 9:42 AM  Addendum: To transition LMWH to UFH in setting of AKI and severe thrombocytopenia. LMWH 80 mg given at 0913 AM.  INR 2.0.  Hg 8, PLTC 12  Plan: start heparin at 2200 tonight with no bolus at rate of 950 units/hr and check 8 hr HL 6/30 at 6 am  Eudelia Bunch, Pharm.D 08/04/2018 11:12 AM

## 2018-08-04 NOTE — Progress Notes (Addendum)
HEMATOLOGY-ONCOLOGY PROGRESS NOTE  SUBJECTIVE: Remains confused. Unable to answer questions.  Received first dose of IVIG on 08/03/2018 which he tolerated well.  He also received 1 unit of platelets yesterday.  Nursing has not noticed any active bleeding.  He does have multiple ecchymotic areas on his arms and legs.  Remains on pressors.  OBJECTIVE: REVIEW OF SYSTEMS:   Not obtained because of confusion  PHYSICAL EXAMINATION: ECOG PERFORMANCE STATUS: 4 - Bedbound  Vitals:   08/04/18 0900 08/04/18 1000  BP:    Pulse: (!) 103 (!) 105  Resp: 16 15  Temp:    SpO2: 95% 97%   Filed Weights   08/02/18 0500 08/03/18 0500 08/04/18 0500  Weight: 199 lb 1.2 oz (90.3 kg) 200 lb 9.9 oz (91 kg) 203 lb 4.2 oz (92.2 kg)   Exam:  GENERAL: Sleeping, opens eyes but does not answer questions LUNGS:  There HEART: Tachycardia ABDOMEN:abdomen soft, non-tender and normal bowel sounds Musculoskeletal:no cyanosis of digits and no clubbing  NEURO: Moving all extremities.  Not oriented Extremities: Generalized edema Skin: Multiple ecchymotic areas over his arms and legs.  LABORATORY DATA:  I have reviewed the data as listed CMP Latest Ref Rng & Units 08/04/2018 08/03/2018 08/02/2018  Glucose 70 - 99 mg/dL 128(H) 211(H) 87  BUN 8 - 23 mg/dL 70(H) 66(H) 55(H)  Creatinine 0.61 - 1.24 mg/dL 1.67(H) 1.96(H) 1.79(H)  Sodium 135 - 145 mmol/L 140 138 136  Potassium 3.5 - 5.1 mmol/L 3.7 4.8 5.1  Chloride 98 - 111 mmol/L 116(H) 114(H) 107  CO2 22 - 32 mmol/L 14(L) 14(L) 13(L)  Calcium 8.9 - 10.3 mg/dL 6.8(L) 6.8(L) 7.2(L)  Total Protein 6.5 - 8.1 g/dL 4.5(L) 4.2(L) -  Total Bilirubin 0.3 - 1.2 mg/dL 5.6(H) 3.9(H) -  Alkaline Phos 38 - 126 U/L 127(H) 158(H) -  AST 15 - 41 U/L 482(H) 964(H) -  ALT 0 - 44 U/L 140(H) 186(H) -    Lab Results  Component Value Date   WBC 10.6 (H) 08/04/2018   HGB 8.0 (L) 08/04/2018   HCT 27.6 (L) 08/04/2018   MCV 90.2 08/04/2018   PLT 12 (LL) 08/04/2018   NEUTROABS 5.9  08/04/2018    ASSESSMENT AND PLAN: 1. Worsening thrombocytopenia: Will give IVIG and platelets today 2.  Elevated LFTS: suspected to be due to hypo-perfusion of liver. On pressors.  LFTs have improved but total bilirubin is rising. 3. Anemia and Leucocytosis: due to acute illness; improved today 4. DVT and PE: On IV heparin.  Mikey Bussing, DNP, AGPCNP-BC, AOCNP  Attending Note  I personally saw the patient, reviewed the chart and examined the patient.   I agree with the assessment and plan documented above. -Severe and worsening thrombocytopenia: We will plan to administer 2 units of platelets today along with IVIG. -DVT/PE/infarcts/DIC: Unfortunately we have to continue with anticoagulation with heparin -Currently on steroids Unfortunately patient is very critical.

## 2018-08-04 NOTE — Progress Notes (Addendum)
MD ordered to remove arterial line. Pharmacy discontinued IV fluids, after discontinuing IV fluids BP was soft. Spoke with charge nurse Zoe and she recommended to monitor BP once restarting IV fluids. Concern for low BP, wanted to have accurate measurement of BP. BP is now stable, will have night shift nurse remove arterial line.

## 2018-08-04 NOTE — Progress Notes (Signed)
[facility] NOTE   Pharmacy Consult for TPN Indication: critically ill - altered mental status, elevated INR and low platelets preclude enteral tube feed placement  Patient Measurements: Height: 5\' 10"  (177.8 cm) Weight: 203 lb 4.2 oz (92.2 kg) IBW/kg (Calculated) : 73 TPN AdjBW (KG): 82.1 Body mass index is 29.17 kg/m. Usual Weight:   Current Nutrition: NPO  IVF: D545NS 1 amp bicarb at 125 ml/hr  Central access: has triple lumen CVC placed 08/02/18 TPN start date: 08/04/18  ASSESSMENT                                                                                                          HPI: 78 year-old male with medical history significant for recently diagnosed pulmonary embolus and BLE DVTs on Xarelto, HTN, hyperlipidemia, depression, and hard of hearing. He presented to the ED for evaluation of generalized weakness, confusion, abdominal distention. Patient was recently admitted from 6/12-6/16 for a L segmental pulmonary embolus and BLE DVTs. He was discharged on home supplemental oxygen due to persistent hypoxia while admitted. He was re-admitted 6/21-6/23 for generalized weakness/confusion and lethargy. Repeat CTA chest showed persistent known PE. He returned to the ED on 6/24 per PCP recommendation.  Significant events: critically ill with MSOF, off pressors.   Today:    Glucose: 4 U SSI/24 hrs, on solucortef 100 IV q6h  Electrolytes: WNL, CoCa 8.88  Renal:AKI, Scr 1.67, BUN 70;  I/O + 3483, UOP 1025/24  hrs  LFTs: elevated LFTs, GI "may have originally started with passive hepatic congestion/right heart strain from pulmonary embolism, now appears to be largely due to systemic illness and ischemic hepatopathy.  Transaminases are improving, but the bilirubin is expected to rise over the next few days, as is typical with this scenario"  TGs: 104 on 6/13  Prealbumin: < 5 on 6/26, probably due to critical illness  NUTRITIONAL GOALS                                                                                              RD recs: 08/03/18 Kcal:  2094-7096 kcal Protein:  102-120 grams Fluid:  >/= 2.1 L/day  Custom TPN at goal rate of 90 ml/hr provides: -  118.8 g/day protein (55 g/L) - 64..8 g/day Lipid   (30 g/L) - 324  g/day Dextrose (15 %) -  2225 Kcal/day  PLAN  At 1800 today:  Start TPN at 40 ml/hr with no lipids  withhold lipids for first week in ICU (today is ICU D#5), will hold x 2 days then start lipids on 7/1 per critical care guidelines  Electrolytes in TPN: Standard x increased Ca  Cl:Ac ratio max acetate  Plan to advance as tolerated to the goal rate.  TPN to contain standard multivitamins and trace elements on MWF only due to shortage  Reduce IVF to 85 ml/hr to keep total IVF 125 or per MD  Continue current SSI .   TPN lab panels on Mondays & Thursdays.  F/u daily.  Eudelia Bunch, Pharm.D 08/04/2018 11:02 AM

## 2018-08-04 NOTE — Progress Notes (Signed)
GI Progress Note  Chief Complaint: Elevated LFTs  History:  This patient remains critically ill with multisystem organ failure.  Signout received from Dr. Lyndel Safe, events of hospitalization reviewed.  Luis Cisneros remains confused, moaning, reaching out towards staff members, unable to provide history or review of systems. Hematology follow-up note reviewed.  He is no longer on pressors, and is on Primaxin, stress dose hydrocortisone, low molecular weight heparin for PE and IVIG for severe and worsening thrombocytopenia.  ROS: Patient's mental status precludes review of systems. Objective:   Current Facility-Administered Medications:  .  acetaminophen (TYLENOL) tablet 650 mg, 650 mg, Oral, Q6H PRN **OR** acetaminophen (TYLENOL) suppository 650 mg, 650 mg, Rectal, Q6H PRN, Siegel Pronto, [name], MD .  buPROPion (WELLBUTRIN XL) 24 hr tablet 300 mg, 300 mg, Oral, Daily, [name], MD, 300 mg at 08/01/18 0945 .  camphor-menthol (SARNA) lotion, , Topical, BID, [name], NP, 1 application at 03/54/65 2028 .  Chlorhexidine Gluconate Cloth 2 % PADS 6 each, 6 each, Topical, Daily, Desiree Hane, MD, 6 each at 08/03/18 [phone] .  Chlorhexidine Gluconate Cloth 2 % PADS 6 each, 6 each, Topical, Daily, [name], MD, 6 each at 08/03/18 [phone] .  enoxaparin (LOVENOX) injection 80 mg, 80 mg, Subcutaneous, Q12H, Dorrene German, RPH, 80 mg at 08/03/18 2200 .  hydrocortisone sodium succinate (SOLU-CORTEF) 100 MG injection 100 mg, 100 mg, Intravenous, Q6H, [name], MD, 100 mg at 08/04/18 0543 .  imipenem-cilastatin (PRIMAXIN) 500 mg in sodium chloride 0.9 % 100 mL IVPB, 500 mg, Intravenous, Q8H, Polly Cobia, RPH, Stopped at 08/04/18 0815 .  Immune Globulin 10% (PRIVIGEN) IV infusion 35 g, 400 mg/kg, Intravenous, Q24 Hr x 5, Gudena, Vinay, MD, 35 g at 08/03/18 1716 .  insulin aspart (novoLOG) injection 0-9 Units, 0-9 Units, Subcutaneous, Q4H, [name], MD, 1 Units at  08/04/18 [phone] .  MEDLINE mouth rinse, 15 mL, Mouth Rinse, BID, Patel, [name], MD, 15 mL at 08/03/18 2200 .  norepinephrine (LEVOPHED) 4 mg in sodium chloride 0.9 % 250 mL (0.016 mg/mL) infusion, 0-40 mcg/min, Intravenous, Titrated, [name], MD, Stopped at 08/03/18 1019 .  ondansetron (ZOFRAN) tablet 4 mg, 4 mg, Oral, Q6H PRN **OR** ondansetron (ZOFRAN) injection 4 mg, 4 mg, Intravenous, Q6H PRN, Hartstein Pronto, [name], MD .  senna-docusate (Senokot-S) tablet 1 tablet, 1 tablet, Oral, BID, [name], MD, 1 tablet at 08/01/18 2240 .  sodium bicarbonate 50 mEq in dextrose 5 % and 0.45% NaCl 1,000 mL infusion, , Intravenous, Continuous, [name], MD, Last Rate: 125 mL/hr at 08/03/18 1716 .  sodium chloride flush (NS) 0.9 % injection 10-40 mL, 10-40 mL, Intracatheter, Q12H, [name], MD, 10 mL at 08/03/18 1024 .  sodium chloride flush (NS) 0.9 % injection 10-40 mL, 10-40 mL, Intracatheter, PRN, [name], MD .  sodium chloride flush (NS) 0.9 % injection 10-40 mL, 10-40 mL, Intracatheter, Q12H, Desiree Hane, MD, Stopped at 08/04/18 1000 .  sodium chloride flush (NS) 0.9 % injection 10-40 mL, 10-40 mL, Intracatheter, PRN, [name], MD .  sodium chloride flush (NS) 0.9 % injection 10-40 mL, 10-40 mL, Intracatheter, Q12H, [name], MD, Stopped at 08/04/18 1000 .  sodium chloride flush (NS) 0.9 % injection 10-40 mL, 10-40 mL, Intracatheter, PRN, [name], MD .  vancomycin (VANCOCIN) IVPB 1000 mg/200 mL premix, 1,000 mg, Intravenous, Q24H, Polly Cobia, RPH, Stopped at 08/03/18 1123 .  vasopressin (PITRESSIN) 40 Units in sodium chloride 0.9 % 250 mL (0.16 Units/mL) infusion, 0.03 Units/min, Intravenous, Continuous, [name], MD, Stopped at 08/03/18 [phone]  . imipenem-cilastatin Stopped (08/04/18 0815)  . norepinephrine (LEVOPHED) Adult infusion Stopped (08/03/18 1019)  .  sodium bicarbonate  infusion 1000 mL 125 mL/hr at 08/03/18 1716  .  vancomycin Stopped (08/03/18 1123)  . vasopressin (PITRESSIN) infusion - *FOR SHOCK* Stopped (08/03/18 8250)     Vital signs in last 24 hrs: Vitals:   08/04/18 0600 08/04/18 0800  BP:    Pulse: (!) 107   Resp: 20   Temp:  (!) 96.7 F (35.9 C)  SpO2: 94%     Intake/Output Summary (Last 24 hours) at 08/04/2018 0900 Last data filed at 08/04/2018 0710 Gross per 24 hour  Intake 6327.29 ml  Output 1025 ml  Net 5302.29 ml     Physical Exam Critically ill man, confused, moving all extremities.  Moaning and unable to converse  HEENT: sclera anicteric, oral mucosa dry and not well visualized  Neck: supple, no thyromegaly, JVD or lymphadenopathy  Cardiac: Tachycardic and regular (sinus tachycardia on monitor), anasarca and scrotal edema  Pulm: Fair inspiratory effort, no wheezing,   Abdomen: soft, difficult to determine if tenderness, with active bowel sounds.  Patient unable to relax abdominal wall muscles to determine if hepatosplenomegaly or mass.  He does not seem distended, and has active bowel sounds   skin; warm and dry, + jaundice Left subclavian central venous catheter and right upper arm PICC line Recent Labs:  CBC Latest Ref Rng & Units 08/04/2018 08/03/2018 08/03/2018  WBC 4.0 - 10.5 K/uL 10.6(H) - 22.4(H)  Hemoglobin 13.0 - 17.0 g/dL 8.0(L) - 8.6(L)  Hematocrit 39.0 - 52.0 % 27.6(L) - 29.3(L)  Platelets 150 - 400 K/uL 12(LL) 18(LL) 22(LL)    Recent Labs  Lab 08/01/18 1035  INR 1.7*   CMP Latest Ref Rng & Units 08/04/2018 08/03/2018 08/02/2018  Glucose 70 - 99 mg/dL 128(H) 211(H) 87  BUN 8 - 23 mg/dL 70(H) 66(H) 55(H)  Creatinine 0.61 - 1.24 mg/dL 1.67(H) 1.96(H) 1.79(H)  Sodium 135 - 145 mmol/L 140 138 136  Potassium 3.5 - 5.1 mmol/L 3.7 4.8 5.1  Chloride 98 - 111 mmol/L 116(H) 114(H) 107  CO2 22 - 32 mmol/L 14(L) 14(L) 13(L)  Calcium 8.9 - 10.3 mg/dL 6.8(L) 6.8(L) 7.2(L)  Total Protein 6.5 - 8.1 g/dL 4.5(L) 4.2(L) -  Total Bilirubin 0.3 - 1.2 mg/dL 5.6(H)  3.9(H) -  Alkaline Phos 38 - 126 U/L 127(H) 158(H) -  AST 15 - 41 U/L 482(H) 964(H) -  ALT 0 - 44 U/L 140(H) 186(H) -     @ASSESSMENTPLANBEGIN @ Assessment: Elevated liver enzymes-may have originally started with passive hepatic congestion/right heart strain from pulmonary embolism, now appears to be largely due to systemic illness and ischemic hepatopathy.  Transaminases are improving, but the bilirubin is expected to rise over the next few days, as is typical with this scenario.  Severe protein calorie malnutrition due to weeks of severe illness.  He appears to have had no meaningful nutrition since admission since his mental status has precluded oral intake.  The severe thrombocytopenia as well as the coagulopathy patient's fusion, with risk for pulling at tubes and lines, precludes safe placement of nasoduodenal tube.   The nature of this patient's acute illness is somewhat unclear.  He had MRSA bacteremia, negative MRSA swab.  Is not clear if he has further underlying clot causing splenomegaly and thrombocytopenia.  He is on anticoagulation and followed closely by hematology service. Plan:  PT/INR ordered for this morning and tomorrow morning.  Discussed with nursing during my evaluation, and they will draw this off the patient's central line.  He should be evaluated by the pharmacy service for TPN -order placed  Total time 35 minutes, complex patient, extensive chart review.  Nelida Meuse III Office: [phone]

## 2018-08-04 NOTE — Consult Note (Signed)
[place] for Infectious Disease    Date of Admission:  07/17/2018   Total days of antibiotics 3        Day 1 cefepime              Reason for Consult: Progressive decline with elevated liver enzymes and multiorgan failure    Referring Provider: Noe Gens, NP Primary Care Provider: Dr. Tommie Ard Baxley  Assessment: Despite 3 recent hospitalizations and extensive evaluation by multiple specialists, no unifying cause for his multisystem illness has been found.  I do not find convincing evidence of active infection.  I will check cytomegalovirus IgM antibody for the sake of completeness but doubt that this explains his severe progressive illness.  I do not see evidence for pneumonia, UTI, cholecystitis, other intra-abdominal infection or endocarditis.  The 2+ blood cultures represent insignificant contaminants.  I will continue cefepime for now but would strongly consider stopping it soon.  Plan: 1. Continue cefepime for now 2. CMV IgM 3. Agree with palliative care consult  Principal Problem:   Elevated liver enzymes Active Problems:   Generalized weakness   Thrombocytopenia (HCC)   Encephalopathy   Splenic infarct   Acute respiratory failure with hypoxemia (HCC)   Shock circulatory (HCC)   Acute kidney injury (HCC)   Normocytic anemia   Unintentional weight loss   PE (pulmonary thromboembolism) (HCC)   Depression   Constipation   Hyponatremia   Lactic acid acidosis   Anasarca   Elevated INR   Hypoalbuminemia   Scheduled Meds: . camphor-menthol   Topical BID  . Chlorhexidine Gluconate Cloth  6 each Topical Daily  . Chlorhexidine Gluconate Cloth  6 each Topical Daily  . hydrocortisone sod succinate (SOLU-CORTEF) inj  50 mg Intravenous Q6H  . Immune Globulin 10%  400 mg/kg Intravenous Q24 Hr x 5  . insulin aspart  0-9 Units Subcutaneous Q4H  . mouth rinse  15 mL Mouth Rinse BID  . sodium chloride flush  10-40 mL Intracatheter Q12H   Continuous  Infusions: . ceFEPime (MAXIPIME) IV Stopped (08/04/18 1204)  . heparin    .  sodium bicarbonate  infusion 1000 mL    . TPN ADULT (ION)     PRN Meds:.acetaminophen **OR** acetaminophen, bisacodyl, ondansetron **OR** ondansetron (ZOFRAN) IV, sodium chloride flush  HPI: Luis Cisneros is a 78 y.o. male with a remote history of PE in 2006.  Notes by Dr. Renold Genta indicate that he has been losing weight since October 2019 and had become progressively weak.  He was noted to have elevated liver enzymes.  He was hospitalized from June 12-16.  Chest CT scan showed acute pulmonary embolus.  CT scan of the abdomen and pelvis was unremarkable. Test of COVID-19 was negative.  He was started on rivaroxaban and discharged.  He was readmitted on 07/27/2018 with fever, weakness, confusion and hypoxia.  There was a question of lung infarction on scan.  Repeat COVID testing was negative again.  He had some slight improvement and was discharged on 07/29/2018 but fell when he got out of his car at home and came right back for readmission on 07/29/2018.  At this point his liver enzymes were increasing and repeat CT showed mild splenomegaly with probable infarctions.  He has had progressive thrombocytopenia and hypotension.  He remains encephalopathic.  He has not had any documented fever.  Admission blood cultures were negative.  They were repeated on 07/24/2018 and 1 of 2 sets grew  micrococcus.  They were repeated again on 08/01/2018 and 1 of 2 sets grew methicillin-resistant coagulase-negative staph.  He was started on broad empiric antibiotic therapy 3 days ago.  This was narrowed to cefepime today.  Recent testing for hepatitis A, B, C and Epstein-Barr virus were negative.  HIV antibody was negative.  During these admissions he has been evaluated by GI, hematology, cardiology and pulmonary.   Review of Systems: Review of Systems  Unable to perform ROS: Mental acuity    Past Medical History:  Diagnosis Date  . Ankle  fracture 2006   right  . Arthritis   . Depression   . HOH (hard of hearing)    wears hearing aides in both ears  . Hyperlipidemia   . Hypertension   . Pulmonary embolism ([place])   . Vitamin D deficiency     Social History   Tobacco Use  . Smoking status: Former Smoker    Packs/day: 1.00    Types: Cigarettes    Quit date: 02/06/1987    Years since quitting: 31.5  . Smokeless tobacco: Never Used  Substance Use Topics  . Alcohol use: Yes    Comment: 1 drink  4-5 days a week  . Drug use: No    Family History  Problem Relation Age of Onset  . Aortic aneurysm Mother   . Heart disease Father    Allergies  Allergen Reactions  . Cephalexin Nausea Only  . Feldene [Piroxicam] Hives, Nausea Only and Other (See Comments)    Makes pt feel loopy    OBJECTIVE: Blood pressure (!) 97/39, pulse (!) 110, temperature 98.1 F (36.7 C), temperature source Axillary, resp. rate 13, height 5\' 10"  (1.778 m), weight 96.3 kg, SpO2 97 %.  Physical Exam Constitutional:      Comments: He is resting quietly in bed with his eyes closed.  He will occasionally raise his arm to touch his face.  He does not respond to questions.  Cardiovascular:     Rate and Rhythm: Normal rate and regular rhythm.     Heart sounds: No murmur.  Pulmonary:     Effort: Pulmonary effort is normal.     Breath sounds: Normal breath sounds. No wheezing or rales.  Abdominal:     Palpations: Abdomen is soft. There is no mass.     Tenderness: There is no abdominal tenderness.  Musculoskeletal:        General: No swelling or tenderness.  Skin:    Findings: No rash.     Comments: Scattered ecchymoses.     Lab Results Lab Results  Component Value Date   WBC 10.6 (H) 08/04/2018   HGB 8.0 (L) 08/04/2018   HCT 27.6 (L) 08/04/2018   MCV 90.2 08/04/2018   PLT 12 (LL) 08/04/2018    Lab Results  Component Value Date   CREATININE 1.67 (H) 08/04/2018   BUN 70 (H) 08/04/2018   NA 140 08/04/2018   K 3.7 08/04/2018   CL  116 (H) 08/04/2018   CO2 14 (L) 08/04/2018    Lab Results  Component Value Date   ALT 140 (H) 08/04/2018   AST 482 (H) 08/04/2018   ALKPHOS 127 (H) 08/04/2018   BILITOT 5.6 (H) 08/04/2018     Microbiology: Recent Results (from the past 240 hour(s))  Urine culture     Status: Abnormal   Collection Time: 07/27/18 11:46 AM   Specimen: Urine, Random  Result Value Ref Range Status   Specimen Description   Final  URINE, RANDOM Performed at [facility], [place] [address], [place], [place] [address]    Special Requests   Final    NONE Performed at [facility], [place] [address] [place], [place] [address]    Culture MULTIPLE SPECIES PRESENT, SUGGEST RECOLLECTION (A)  Final   Report Status 07/28/2018 FINAL  Final  SARS Coronavirus 2 (CEPHEID - Performed in [facility] hospital lab), Hosp Order     Status: None   Collection Time: 07/27/18 11:47 AM   Specimen: Nasopharyngeal Swab  Result Value Ref Range Status   SARS Coronavirus 2 NEGATIVE NEGATIVE Final    Comment: (NOTE) If result is NEGATIVE SARS-CoV-2 target nucleic acids are NOT DETECTED. The SARS-CoV-2 RNA is generally detectable in upper and lower  respiratory specimens during the acute phase of infection. The lowest  concentration of SARS-CoV-2 viral copies this assay can detect is 250  copies / mL. A negative result does not preclude SARS-CoV-2 infection  and should not be used as the sole basis for treatment or other  patient management decisions.  A negative result may occur with  improper specimen collection / handling, submission of specimen other  than nasopharyngeal swab, presence of viral mutation(s) within the  areas targeted by this assay, and inadequate number of viral copies  (<250 copies / mL). A negative result must be combined with clinical  observations, patient history, and epidemiological information. If result is POSITIVE SARS-CoV-2 target nucleic acids are DETECTED.  The SARS-CoV-2 RNA is generally detectable in upper and lower  respiratory specimens dur ing the acute phase of infection.  Positive  results are indicative of active infection with SARS-CoV-2.  Clinical  correlation with patient history and other diagnostic information is  necessary to determine patient infection status.  Positive results do  not rule out bacterial infection or co-infection with other viruses. If result is PRESUMPTIVE POSTIVE SARS-CoV-2 nucleic acids MAY BE PRESENT.   A presumptive positive result was obtained on the submitted specimen  and confirmed on repeat testing.  While 2019 novel coronavirus  (SARS-CoV-2) nucleic acids may be present in the submitted sample  additional confirmatory testing may be necessary for epidemiological  and / or clinical management purposes  to differentiate between  SARS-CoV-2 and other Sarbecovirus currently known to infect humans.  If clinically indicated additional testing with an alternate test  methodology [phone]) is advised. The SARS-CoV-2 RNA is generally  detectable in upper and lower respiratory sp ecimens during the acute  phase of infection. The expected result is Negative. Fact Sheet for Patients:  StrictlyIdeas.no Fact Sheet for Healthcare Providers: BankingDealers.co.za This test is not yet approved or cleared by the [place] FDA and has been authorized for detection and/or diagnosis of SARS-CoV-2 by FDA under an Emergency Use Authorization (EUA).  This EUA will remain in effect (meaning this test can be used) for the duration of the COVID-19 declaration under Section 564(b)(1) of the Act, 21 U.S.C. section 360bbb-3(b)(1), unless the authorization is terminated or revoked sooner. Performed at [facility], [place] [address], [place], [place] [address]   Culture, blood (routine x 2)     Status: None   Collection Time: 07/28/18  8:49 PM   Specimen:  BLOOD  Result Value Ref Range Status   Specimen Description   Final    BLOOD BLOOD RIGHT HAND Performed at [facility] [address] [place], [place] [address]    Special Requests   Final  BOTTLES DRAWN AEROBIC AND ANAEROBIC Blood Culture adequate volume Performed at [facility] [address], [place], [place] [address]    Culture   Final    NO GROWTH 5 DAYS Performed at [facility], [place] [address] [place], [place] [address]    Report Status 08/02/2018 FINAL  Final  Culture, blood (routine x 2)     Status: None   Collection Time: 07/28/18  8:49 PM   Specimen: BLOOD RIGHT ARM  Result Value Ref Range Status   Specimen Description   Final    BLOOD RIGHT ARM Performed at [facility], [place] [address], [place], [place] [address]    Special Requests   Final    BOTTLES DRAWN AEROBIC AND ANAEROBIC Blood Culture adequate volume Performed at [facility] [place] [address], [place], [place] [address]    Culture  Setup Time   Final    GRAM POSITIVE RODS ANAEROBIC BOTTLE ONLY CRITICAL RESULT CALLED TO, READ BACK BY AND VERIFIED WITH: RN D DESOTA [id number]    Culture   Final    GRAM POSITIVE RODS UNABLE TO FURTHER ID. CALL MICRO LAB IF ID IS NEEDED. Performed at [facility], [place] [address], [place], [place] [address]    Report Status 08/04/2018 FINAL  Final  Culture, blood (Routine x 2)     Status: Abnormal   Collection Time: 07/07/2018  5:09 PM   Specimen: BLOOD LEFT FOREARM  Result Value Ref Range Status   Specimen Description   Final    BLOOD LEFT FOREARM Performed at [facility] [address], [place], [place] [address]    Special Requests   Final    BOTTLES DRAWN AEROBIC AND ANAEROBIC Blood Culture adequate volume Performed at [place] [address] [place], [place] [address]    Culture  Setup Time   Final    GRAM POSITIVE COCCI AEROBIC  BOTTLE ONLY CRITICAL RESULT CALLED TO, READ BACK BY AND VERIFIED WITH: B GREEN PHARMD 07/31/18 0035 JDW    Culture (A)  Final    MICROCOCCUS LUTEUS/LYLAE THE SIGNIFICANCE OF ISOLATING THIS ORGANISM FROM A SINGLE SET OF BLOOD CULTURES WHEN MULTIPLE SETS ARE DRAWN IS UNCERTAIN. PLEASE NOTIFY THE MICROBIOLOGY DEPARTMENT WITHIN ONE WEEK IF SPECIATION AND SENSITIVITIES ARE REQUIRED. Performed at [facility], [place] [address] [place], [place] [address]    Report Status 08/02/2018 FINAL  Final  Blood Culture ID Panel (Reflexed)     Status: None   Collection Time: 07/23/2018  5:09 PM  Result Value Ref Range Status   Enterococcus species NOT DETECTED NOT DETECTED Final   Listeria monocytogenes NOT DETECTED NOT DETECTED Final   Staphylococcus species NOT DETECTED NOT DETECTED Final   Staphylococcus aureus (BCID) NOT DETECTED NOT DETECTED Final   Streptococcus species NOT DETECTED NOT DETECTED Final   Streptococcus agalactiae NOT DETECTED NOT DETECTED Final   Streptococcus pneumoniae NOT DETECTED NOT DETECTED Final   Streptococcus pyogenes NOT DETECTED NOT DETECTED Final   Acinetobacter baumannii NOT DETECTED NOT DETECTED Final   Enterobacteriaceae species NOT DETECTED NOT DETECTED Final   Enterobacter cloacae complex NOT DETECTED NOT DETECTED Final   Escherichia coli NOT DETECTED NOT DETECTED Final   Klebsiella oxytoca NOT DETECTED NOT DETECTED Final   Klebsiella pneumoniae NOT DETECTED NOT DETECTED Final   Proteus species NOT DETECTED NOT DETECTED Final   Serratia marcescens NOT DETECTED NOT DETECTED Final   Haemophilus  influenzae NOT DETECTED NOT DETECTED Final   Neisseria meningitidis NOT DETECTED NOT DETECTED Final   Pseudomonas aeruginosa NOT DETECTED NOT DETECTED Final   Candida albicans NOT DETECTED NOT DETECTED Final   Candida glabrata NOT DETECTED NOT DETECTED Final   Candida krusei NOT DETECTED NOT DETECTED Final   Candida parapsilosis NOT DETECTED NOT DETECTED Final   Candida  tropicalis NOT DETECTED NOT DETECTED Final    Comment: Performed at [facility], [place] [address] [place], [place] [address]  Culture, blood (Routine x 2)     Status: None   Collection Time: 07/25/2018  5:11 PM   Specimen: BLOOD RIGHT HAND  Result Value Ref Range Status   Specimen Description   Final    BLOOD RIGHT HAND Performed at [facility] [address] [place], [place] [address]    Special Requests   Final    BOTTLES DRAWN AEROBIC AND ANAEROBIC Blood Culture adequate volume Performed at [facility] [address], [place], [place] [address]    Culture   Final    NO GROWTH 5 DAYS Performed at [facility], [place] [address], [place], [place] [address]    Report Status 08/04/2018 FINAL  Final  Novel Coronavirus,NAA,(SEND-OUT TO REF LAB - TAT 24-48 hrs); Hosp Order     Status: None   Collection Time: 07/20/2018  9:42 PM   Specimen: Nasopharyngeal Swab; Respiratory  Result Value Ref Range Status   SARS-CoV-2, NAA NOT DETECTED NOT DETECTED Final    Comment: (NOTE) This test was developed and its performance characteristics determined by [employer]. This test has not been FDA cleared or approved. This test has been authorized by FDA under an Emergency Use Authorization (EUA). This test is only authorized for the duration of time the declaration that circumstances exist justifying the authorization of the emergency use of in vitro diagnostic tests for detection of SARS-CoV-2 virus and/or diagnosis of COVID-19 infection under section 564(b)(1) of the Act, 21 U.S.C. 245YKD-9(I)(3), unless the authorization is terminated or revoked sooner. When diagnostic testing is negative, the possibility of a false negative result should be considered in the context of a patient's recent exposures and the presence of clinical signs and symptoms consistent with COVID-19. An individual without symptoms of COVID-19 and who is not shedding  SARS-CoV-2 virus would expect to have a negative (not detected) result in this assay. Performed  At: [facility] [place], [place] [id number] Rush Farmer MD QB:[id number]    [place]  Final    Comment: Performed at [facility] [address], [place], [place] [address]  MRSA PCR Screening     Status: None   Collection Time: 08/01/2018 11:20 PM   Specimen: Nasal Mucosa; Nasopharyngeal  Result Value Ref Range Status   MRSA by PCR NEGATIVE NEGATIVE Final    Comment:        The GeneXpert MRSA Assay (FDA approved for NASAL specimens only), is one component of a comprehensive MRSA colonization surveillance program. It is not intended to diagnose MRSA infection nor to guide or monitor treatment for MRSA infections. Performed at [facility], [place] [address], [place], [place] [address]   Culture, blood (routine x 2)     Status: None (Preliminary result)   Collection Time: 08/01/18  6:03 PM   Specimen: BLOOD  Result Value Ref Range Status   Specimen Description   Final    BLOOD SITE NOT SPECIFIED  Performed at [facility], [place] [address] [place], [place] [address]    Special Requests   Final    BOTTLES DRAWN AEROBIC AND ANAEROBIC Blood Culture adequate volume Performed at [place] [address], [place], [place] [address]    Culture   Final    NO GROWTH 3 DAYS Performed at [facility], [place] [address], [place], [place] [address]    Report Status PENDING  Incomplete  Culture, blood (routine x 2)     Status: Abnormal   Collection Time: 08/01/18  6:49 PM   Specimen: BLOOD  Result Value Ref Range Status   Specimen Description   Final    BLOOD RIGHT ANTECUBITAL Performed at [facility] [address], [place], [place] [address]    Special Requests   Final    BOTTLES DRAWN AEROBIC ONLY Blood Culture adequate volume Performed at [facility] [address], [place], [place] [address]    Culture  Setup Time   Final    GRAM POSITIVE COCCI IN CLUSTERS AEROBIC BOTTLE ONLY Organism ID to follow CRITICAL RESULT CALLED TO, READ BACK BY AND VERIFIED WITH: PHARMD WALFORD, D 1743 [id number] FCP    Culture (A)  Final    STAPHYLOCOCCUS SPECIES (COAGULASE NEGATIVE) THE SIGNIFICANCE OF ISOLATING THIS ORGANISM FROM A SINGLE SET OF BLOOD CULTURES WHEN MULTIPLE SETS ARE DRAWN IS UNCERTAIN. PLEASE NOTIFY THE MICROBIOLOGY DEPARTMENT WITHIN ONE WEEK IF SPECIATION AND SENSITIVITIES ARE REQUIRED. Performed at [facility], [place] [address], [place],  [address]    Report Status 08/03/2018 FINAL  Final  Blood Culture ID Panel (Reflexed)     Status: Abnormal   Collection Time: 08/01/18  6:49 PM  Result Value Ref Range Status   Enterococcus species NOT DETECTED NOT DETECTED Final   Listeria monocytogenes NOT DETECTED NOT DETECTED Final   Staphylococcus species DETECTED (A) NOT DETECTED Final    Comment: Methicillin (oxacillin) resistant coagulase negative staphylococcus. Possible blood culture contaminant (unless isolated from more than one blood culture draw or clinical case suggests pathogenicity). No antibiotic treatment is indicated for blood  culture contaminants. CRITICAL RESULT CALLED TO, READ BACK BY AND VERIFIED WITH: PHARMD WALFORD, D 1743 [id number] FCP    Staphylococcus aureus (BCID) NOT DETECTED NOT DETECTED Final   Methicillin resistance DETECTED (A) NOT DETECTED Final    Comment: CRITICAL RESULT CALLED TO, READ BACK BY AND VERIFIED WITH: PHARMD WALFORD, D 1743 [id number] FCP    Streptococcus species NOT DETECTED NOT DETECTED Final   Streptococcus agalactiae NOT DETECTED NOT DETECTED Final   Streptococcus pneumoniae NOT DETECTED NOT DETECTED Final   Streptococcus pyogenes NOT DETECTED NOT DETECTED Final   Acinetobacter baumannii NOT DETECTED NOT DETECTED Final   Enterobacteriaceae species NOT DETECTED NOT  DETECTED Final   Enterobacter cloacae complex NOT DETECTED NOT DETECTED Final   Escherichia coli NOT DETECTED NOT DETECTED Final   Klebsiella oxytoca NOT DETECTED NOT DETECTED Final   Klebsiella pneumoniae NOT DETECTED NOT DETECTED Final   Proteus species NOT DETECTED NOT DETECTED Final   Serratia marcescens NOT DETECTED NOT DETECTED Final   Haemophilus influenzae NOT DETECTED NOT DETECTED Final   Neisseria meningitidis NOT DETECTED NOT DETECTED Final   Pseudomonas aeruginosa NOT DETECTED NOT DETECTED Final   Candida albicans NOT DETECTED NOT DETECTED Final   Candida glabrata NOT DETECTED NOT DETECTED Final   Candida krusei NOT DETECTED NOT DETECTED Final   Candida parapsilosis NOT DETECTED NOT  DETECTED Final   Candida tropicalis NOT DETECTED NOT DETECTED Final    Comment: Performed at [facility], [place] [address], [place], [place] [address]  MRSA PCR Screening     Status: None   Collection Time: 08/03/18 11:45 PM   Specimen: Nasal Mucosa; Nasopharyngeal  Result Value Ref Range Status   MRSA by PCR NEGATIVE NEGATIVE Final    Comment:        The GeneXpert MRSA Assay (FDA approved for NASAL specimens only), is one component of a comprehensive MRSA colonization surveillance program. It is not intended to diagnose MRSA infection nor to guide or monitor treatment for MRSA infections. Performed at [facility], [place] [address] [place], [place] [address]     Michel Bickers, [place] for [employer] [phone] pager   [phone] cell 08/04/2018, 2:34 PM

## 2018-08-04 NOTE — Progress Notes (Signed)
Pharmacy Antibiotic Note  Luis Cisneros is a 78 y.o. male admitted on 07/14/2018 with bacteremia. Pharmacy has been consulted for cefepime dosing.   Plan: Cefepime 2 gm IV q12  Height: 5\' 10"  (177.8 cm) Weight: 203 lb 4.2 oz (92.2 kg) IBW/kg (Calculated) : 73  Temp (24hrs), Avg:97.3 F (36.3 C), Min:96.4 F (35.8 C), Max:98 F (36.7 C)  Recent Labs  Lab 08/01/18 0217  08/01/18 1535 08/01/18 1803 08/01/18 2350 08/02/18 0130 08/02/18 0642 08/02/18 1040 08/03/18 0508 08/04/18 0307  WBC 14.2*  --   --   --  20.0*  --   --  29.1* 22.4* 10.6*  CREATININE 0.74  --   --   --  1.26*  --   --  1.79* 1.96* 1.67*  LATICACIDVEN  --    < > 7.1* 7.0*  --  8.4* 8.7* 8.6*  --   --    < > = values in this interval not displayed.    Estimated Creatinine Clearance: 41.6 mL/min (A) (by C-G formula based on SCr of 1.67 mg/dL (H)).    Allergies  Allergen Reactions  . Cephalexin Nausea Only  . Feldene [Piroxicam] Hives, Nausea Only and Other (See Comments)    Makes pt feel loopy  Antimicrobials this admission: 6/27 vanc >> 6/29 6/27 primaxin >> 6/29 6/29 cefepime>>  Dose adjustments this admission:  Microbiology results: 6/22 BCx: 1/4 bottles GPR (late growth, anaerobic bottle only, suspect contam) 6/24 BCx: 1/4 bottles micrococcus luteus 6/26 BCx: 1/4 bottles MR-CoNS 6/21 UCx: mx spp 6/24 MRSA PCR: neg 6/28 MRSA PCR: neg  Thank you for allowing pharmacy to be a part of this patient's care.  Eudelia Bunch, Pharm.D 08/04/2018 11:15 AM

## 2018-08-04 NOTE — Patient Outreach (Signed)
[name] Care Management  08/04/2018  Luis Cisneros [birth year] [id number]   CSW noted that patient continues to remain hospitalized due to multisystem organ failure.  Patient is being treated for multiple medical complications, including, but not limited to, Acute Renal Failure with Oliguria, Severe Thrombocytopenia with Elevated LDH/Splenomegaly, Elevated LFT's, Multiple Pulmonary Nodules, Altered Mental Status, Respiratory Failure, Bilateral DVT/PE, etc.  Patient remains critically ill, moaning, confused and reaching out to staff members.  CSW will continue to follow patient while hospitalized, to assess and assist with social work needs and services, as indicated, and then provide community case management services to patient once he is discharged from the hospital.  Skilled nursing placement is on hold for now, as patient is too medically unstable to be discharged from the hospital.  Patient continues to require 24 hour care and supervision.  Nat Christen, BSW, MSW, LCSW  Licensed Education officer, [facility]  [place] [place] [address] [place] [address], [place], [place] [address] Physical Address-[address], [place], [place] [address] Toll Free Main # [phone] Fax # [phone] Cell # [phone]  Office # [phone] [email or web address]

## 2018-08-04 NOTE — Progress Notes (Signed)
PULMONARY / CRITICAL CARE MEDICINE   NAME:  Luis Cisneros, MRN:  [id number], DOB:  06/30/1940, LOS: 5 ADMISSION DATE:  07/10/2018, CONSULTATION DATE:  08/01/2018 REFERRING MD:  Nettey/ Triad, CHIEF COMPLAINT:  weak  BRIEF HISTORY:    78 y/o M, former smoker, admitted 6/24 per [facility] with SOB and fatigue.    He has a prior history of PE (2006).  The patient has had 3 admits in the last 6 months - 6/12 to 6/16 and 6/21 to 6/23 for weakness, confusion, fever and failure to thrive. 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  ECHO at that time was negative for RV strain. Hypercoaguable work-up was notable for decreased protein S and protein C.   Re-admitted 6/21 with weakness, confusion and concern for failure to thrive.  Work up found elevated LFT's and worsening thrombocytopenia. HIT antibody negative.  Acute hepatitis panel negative.  T. Bili 3.9.  Abd US showed gallbladder sludge, no stones, CBD 54mm, hepatic steatosis vs hepatocellular disease, 2.2 cm left lobe cyst, patent portal vein. CT ABD/Pelvis 6/24 showed progressive splenomegaly suspicious for splenic infarcts & a 41mm proximal left ureter stone without hydronephrosis.     He returned 6/24 from his PCP for increased weakness and altered mental status. He reportedly fell while trying to get out of the car on 6/23.  Wife reported increased swelling in LE's, constipation, difficulty initiating urination, confusion, intermittent fevers and poor appetite. Additionally, she has noted some spotting of blood on tissue when patient had BM but no other bleeding.    SIGNIFICANT PAST MEDICAL HISTORY   HTN, hyperlipidemia, depression, hypercoagulability / DVT  SIGNIFICANT EVENTS:  6/27 am did not respond to fluids/ alb bolus so placed on neo then levophed/versed and stress HC  STUDIES:   CT Head 6/24 >> progressive splenomegaly with wedge-shaped areas of decreased attenuation peripherally suspicious for  subacute splenic infarcts, unchanged position of left ureteral calculus, no significant hydronephrosis, interval development of diffuse soft tissue edema, ascites and bilateral pleural effusions consistent with anasarca.  CT ABD/Pelvis 6/24 >> negative  Echo cardiogram 6/26 >  Unremarkable with RAP est  10 mmhg and nl RV /LV fxn  CULTURES:  [facility] 07/25/2018 >> 1/2 micrococcus Luteus/ Lylae  BCx2 6/24 >>  BCID 6/26 >> coag neg staph COVID 6/24 >> negative  MRSA 6/24 >> negative  BCx2 6/26 >> coag neg staph 1/2  BCx2 6/26 >>   ANTIBIOTICS:  Rafixamin 6/24 > 6/25 Primaxin 6/27 >> 6/29 Vanc 6/26 >> 6/29 Cefepime 6/29 >>   LINES/TUBES:  L radial Art line 6/27 >> L [place] CVL 6/27 >>   CONSULTANTS:  Hematology GI   Cardiology 6/26  PCCM 6/26   SUBJECTIVE:  RN reports GI started TPN for nutrition with concern for possible bleeding if cortrak inserted.   Afebrile.    Wife reports pt worked in [employer] up until 6/12, completely independent prior to that admit. She reports he began having a cough at the first of April and he had a "slow down" that she had noted.  He had PND + cough with clear sputum production.  Cough was worse when eating.  Had a couple of episodes of throwing up after a nap / cough turned into a throwing up episode.  Reports multiple possible exposures.    CONSTITUTIONAL: BP (!) 97/39 (BP Location: Left Arm)   Pulse (!) 107   Temp (!) 96.7 F (35.9 C) (  Axillary)   Resp 20   Ht 5\' 10"  (1.778 m)   Wt 92.2 kg   SpO2 94%   BMI 29.17 kg/m      Intake/Output Summary (Last 24 hours) at 08/04/2018 1003 Last data filed at 08/04/2018 0710 Gross per 24 hour  Intake 6327.29 ml  Output 1025 ml  Net 5302.29 ml    CVP:  [8 mmHg-9 mmHg] 9 mmHg    PHYSICAL EXAM: General: ill appearing adult male lying in bed in NAD HEENT: MM pink/dry Neuro: eyes open, awake, speaks but not oriented, no follow commands CV: s1s2 rrr, no m/r/g, ST on monitor  PULM: even/non-labored, lungs  bilaterally clear  MW:UXLK, non-tender, bsx4 active  Extremities: warm/dry, 2-3+ pitting edema up to abdomen  Skin: pale, multiple areas of bruising   RESOLVED PROBLEM LIST    ASSESSMENT AND PLAN    Altered Mental Status  P: Follow serial neuro exam  Minimize sedating medications  Ensure adequate perfusion   Fever Shock with MODS -blood cultures concerning for contaminant, only hardware is left hip which is remote -NOTE BLOOD CULTURES 1/4 with coag neg staph -possible GI source ? P:  Follow cultures Change abx, discussed with pharmacy  Will hold vanco for now given platelets, negative MRSA PCR and possible contaminant   CVP goal >10  Vasopressors if needed for MAP >65  Continue stress dose steroids, adjust dosing   Elevated LFT's  Hyperammoniemia  Suspected Shock Liver  Severe Thrombocytopenia  -elevated LDH, enlarged spleen on CT, negative schistocytes on DIC panel, HIT negative, acute hepatitis panel negative, GB sludge but no stones, patent portal vein, HIV negative, EBV negative, GGT 83.  -Protein C deficiency noted on prior labs  -? Myeloproliferative disorder, hematologic malignancy, TTP ? Normocytic Anemia  P: Appreciate GI input  Ensure adequate end organ perfusion  Avoid hepatotoxic agents  Trend LDH  Transition lovenox to heparin gtt given platelets  IVIG per HEME / ONC  Platelet transfusion per HEME Follow CBC, LDH   AKI  -urine Na <10, consistent with pre-renal failure  AG Metabolic Acidosis  -progressive lactic acidosis with hypotension, worsening renal function. Thrombocytopenia & splenomegaly not likely due to passive congestion based on ECHO findings 6/26  P: Trend BMP / urinary output Replace electrolytes as indicated Avoid nephrotoxic agents, ensure adequate renal perfusion Continue bicarbonate gtt for now Send ADAMS-13, [place] testing   Acute Hypoxic Respiratory Failure  -secondary to PE with concern for infarct of LLL  Pulmonary  Embolism (L), DVT  P: O2 for sats >90% Heparin gtt as above   Multiple Pulmonary Nodules P: Follow as outpatient   Moderate to Severe Protein Calorie Malnutrition  P: TPN per Pharmacy   Family Discussion:  Extensive discussion with wife via phone, time spent > 46 minutes.  Reviewed overall state of health, patients prior health status before admission at beginning of June, discussed differential diagnoses currently.  Wife asked for ID and Palliative Care consults.     LABS   Glucose Recent Labs  Lab 08/03/18 1217 08/03/18 1531 08/03/18 1946 08/04/18 0029 08/04/18 0319 08/04/18 0728  GLUCAP 222* 185* 142* 129* 119* 122*    BMET Recent Labs  Lab 08/02/18 1040 08/03/18 0508 08/04/18 0307  NA 136 138 140  K 5.1 4.8 3.7  CL 107 114* 116*  CO2 13* 14* 14*  BUN 55* 66* 70*  CREATININE 1.79* 1.96* 1.67*  GLUCOSE 87 211* 128*    Liver Enzymes Recent Labs  Lab 08/01/18 2350 08/03/18  3358 08/04/18 0307  AST 133* 964* 482*  ALT 32 186* 140*  ALKPHOS 163* 158* 127*  BILITOT 2.8* 3.9* 5.6*  ALBUMIN 1.6* 1.6* 1.4*    Electrolytes Recent Labs  Lab 07/29/18 0352  07/31/18 0212  08/02/18 1040 08/03/18 0508 08/04/18 0307  CALCIUM 8.0*   < > 7.9*   < > 7.2* 6.8* 6.8*  MG 1.9  --  2.1  --   --   --   --   PHOS 2.0*  --  1.7*  --   --   --   --    < > = values in this interval not displayed.    CBC Recent Labs  Lab 08/02/18 1040  08/03/18 0508 08/03/18 1630 08/04/18 0307  WBC 29.1*  --  22.4*  --  10.6*  HGB 9.0*  --  8.6*  --  8.0*  HCT 30.0*  --  29.3*  --  27.6*  PLT 34*   < > 22* 18* 12*   < > = values in this interval not displayed.    ABG Recent Labs  Lab 07/28/18 1052 08/01/18 1508 08/02/18 0425  PHART 7.472* 7.379 7.345*  PCO2ART 33.4 26.2* 21.0*  PO2ART 69.1* 71.9* 65.5*    Coag's Recent Labs  Lab 07/28/18 1608 07/29/18 0352 07/28/2018 1709 08/01/18 1035 08/04/18 0858  APTT 197* 92*  --  61*  --   INR  --   --  1.5* 1.7* 2.0*     Sepsis Markers Recent Labs  Lab 08/02/18 0130 08/02/18 0642 08/02/18 0919 08/02/18 1040 08/03/18 0508 08/04/18 0307  LATICACIDVEN 8.4* 8.7*  --  8.6*  --   --   PROCALCITON  --   --  7.58  --  8.20 7.36    CC Time: 35 minutes   Noe Gens, NP-C [facility] Pgr: [phone] or if no answer [phone] 08/04/2018, 10:03 AM

## 2018-08-05 ENCOUNTER — Inpatient Hospital Stay (HOSPITAL_COMMUNITY): Payer: Medicare Other

## 2018-08-05 ENCOUNTER — Telehealth: Payer: Self-pay | Admitting: Internal Medicine

## 2018-08-05 DIAGNOSIS — D72821 Monocytosis (symptomatic): Secondary | ICD-10-CM

## 2018-08-05 DIAGNOSIS — R634 Abnormal weight loss: Secondary | ICD-10-CM

## 2018-08-05 DIAGNOSIS — Z515 Encounter for palliative care: Secondary | ICD-10-CM

## 2018-08-05 DIAGNOSIS — D735 Infarction of spleen: Secondary | ICD-10-CM

## 2018-08-05 DIAGNOSIS — R161 Splenomegaly, not elsewhere classified: Secondary | ICD-10-CM

## 2018-08-05 DIAGNOSIS — D649 Anemia, unspecified: Secondary | ICD-10-CM

## 2018-08-05 DIAGNOSIS — Z7189 Other specified counseling: Secondary | ICD-10-CM

## 2018-08-05 DIAGNOSIS — Z886 Allergy status to analgesic agent status: Secondary | ICD-10-CM

## 2018-08-05 LAB — COMPREHENSIVE METABOLIC PANEL
ALT: 82 U/L — ABNORMAL HIGH (ref 0–44)
AST: 217 U/L — ABNORMAL HIGH (ref 15–41)
Albumin: 1.3 g/dL — ABNORMAL LOW (ref 3.5–5.0)
Alkaline Phosphatase: 110 U/L (ref 38–126)
Anion gap: 8 (ref 5–15)
BUN: 80 mg/dL — ABNORMAL HIGH (ref 8–23)
CO2: 19 mmol/L — ABNORMAL LOW (ref 22–32)
Calcium: 6.9 mg/dL — ABNORMAL LOW (ref 8.9–10.3)
Chloride: 115 mmol/L — ABNORMAL HIGH (ref 98–111)
Creatinine, Ser: 1.93 mg/dL — ABNORMAL HIGH (ref 0.61–1.24)
GFR calc Af Amer: 38 mL/min — ABNORMAL LOW (ref >60)
GFR calc non Af Amer: 32 mL/min — ABNORMAL LOW (ref >60)
Glucose, Bld: 168 mg/dL — ABNORMAL HIGH (ref 70–99)
Potassium: 4.1 mmol/L (ref 3.5–5.1)
Sodium: 142 mmol/L (ref 135–145)
Total Bilirubin: 6 mg/dL — ABNORMAL HIGH (ref 0.3–1.2)
Total Protein: 4.9 g/dL — ABNORMAL LOW (ref 6.5–8.1)

## 2018-08-05 LAB — CBC WITH DIFFERENTIAL/PLATELET
Abs Immature Granulocytes: 0.51 10*3/uL — ABNORMAL HIGH (ref 0.00–0.07)
Basophils Absolute: 0.1 10*3/uL (ref 0.0–0.1)
Basophils Relative: 0 %
Eosinophils Absolute: 0 10*3/uL (ref 0.0–0.5)
Eosinophils Relative: 0 %
HCT: 22.2 % — ABNORMAL LOW (ref 39.0–52.0)
Hemoglobin: 6.8 g/dL — CL (ref 13.0–17.0)
Immature Granulocytes: 4 %
Lymphocytes Relative: 20 %
Lymphs Abs: 2.8 10*3/uL (ref 0.7–4.0)
MCH: 27.3 pg (ref 26.0–34.0)
MCHC: 30.6 g/dL (ref 30.0–36.0)
MCV: 89.2 fL (ref 80.0–100.0)
Monocytes Absolute: 5 10*3/uL — ABNORMAL HIGH (ref 0.1–1.0)
Monocytes Relative: 35 %
Neutro Abs: 5.8 10*3/uL (ref 1.7–7.7)
Neutrophils Relative %: 41 %
Platelets: 22 10*3/uL — CL (ref 150–400)
RBC: 2.49 MIL/uL — ABNORMAL LOW (ref 4.22–5.81)
RDW: 18 % — ABNORMAL HIGH (ref 11.5–15.5)
WBC: 14.1 10*3/uL — ABNORMAL HIGH (ref 4.0–10.5)
nRBC: 14.8 % — ABNORMAL HIGH (ref 0.0–0.2)

## 2018-08-05 LAB — DIFFERENTIAL
Abs Immature Granulocytes: 0.58 10*3/uL — ABNORMAL HIGH (ref 0.00–0.07)
Basophils Absolute: 0.1 10*3/uL (ref 0.0–0.1)
Basophils Relative: 0 %
Eosinophils Absolute: 0 10*3/uL (ref 0.0–0.5)
Eosinophils Relative: 0 %
Immature Granulocytes: 4 %
Lymphocytes Relative: 20 %
Lymphs Abs: 2.7 10*3/uL (ref 0.7–4.0)
Monocytes Absolute: 4.6 10*3/uL — ABNORMAL HIGH (ref 0.1–1.0)
Monocytes Relative: 33 %
Neutro Abs: 5.8 10*3/uL (ref 1.7–7.7)
Neutrophils Relative %: 43 %

## 2018-08-05 LAB — CBC
HCT: 23.6 % — ABNORMAL LOW (ref 39.0–52.0)
Hemoglobin: 7 g/dL — ABNORMAL LOW (ref 13.0–17.0)
MCH: 26.6 pg (ref 26.0–34.0)
MCHC: 29.7 g/dL — ABNORMAL LOW (ref 30.0–36.0)
MCV: 89.7 fL (ref 80.0–100.0)
Platelets: 16 10*3/uL — CL (ref 150–400)
RBC: 2.63 MIL/uL — ABNORMAL LOW (ref 4.22–5.81)
RDW: 17.8 % — ABNORMAL HIGH (ref 11.5–15.5)
WBC: 13.8 10*3/uL — ABNORMAL HIGH (ref 4.0–10.5)
nRBC: 11.6 % — ABNORMAL HIGH (ref 0.0–0.2)

## 2018-08-05 LAB — GLUCOSE, CAPILLARY
Glucose-Capillary: 141 mg/dL — ABNORMAL HIGH (ref 70–99)
Glucose-Capillary: 151 mg/dL — ABNORMAL HIGH (ref 70–99)
Glucose-Capillary: 154 mg/dL — ABNORMAL HIGH (ref 70–99)
Glucose-Capillary: 167 mg/dL — ABNORMAL HIGH (ref 70–99)
Glucose-Capillary: 170 mg/dL — ABNORMAL HIGH (ref 70–99)
Glucose-Capillary: 176 mg/dL — ABNORMAL HIGH (ref 70–99)

## 2018-08-05 LAB — URINALYSIS, ROUTINE W REFLEX MICROSCOPIC
Glucose, UA: 50 mg/dL — AB
Ketones, ur: NEGATIVE mg/dL
Nitrite: NEGATIVE
Protein, ur: 30 mg/dL — AB
RBC / HPF: >50 RBC/hpf — ABNORMAL HIGH (ref 0–5)
Specific Gravity, Urine: 1.015 (ref 1.005–1.030)
WBC, UA: >50 WBC/hpf — ABNORMAL HIGH (ref 0–5)
pH: 5 (ref 5.0–8.0)

## 2018-08-05 LAB — BPAM PLATELET PHERESIS
Blood Product Expiration Date: 202007012359
ISSUE DATE / TIME: 202006291226
Unit Type and Rh: 5100

## 2018-08-05 LAB — C3 COMPLEMENT: C3 Complement: 56 mg/dL — ABNORMAL LOW (ref 82–167)

## 2018-08-05 LAB — C4 COMPLEMENT: Complement C4, Body Fluid: 28 mg/dL (ref 14–44)

## 2018-08-05 LAB — TRIGLYCERIDES: Triglycerides: 174 mg/dL — ABNORMAL HIGH (ref <150)

## 2018-08-05 LAB — PREPARE PLATELET PHERESIS: Unit division: 0

## 2018-08-05 LAB — ANTI-DNA ANTIBODY, DOUBLE-STRANDED

## 2018-08-05 LAB — HEMOGLOBIN AND HEMATOCRIT, BLOOD
HCT: 23.4 % — ABNORMAL LOW (ref 39.0–52.0)
Hemoglobin: 7 g/dL — ABNORMAL LOW (ref 13.0–17.0)

## 2018-08-05 LAB — HEPARIN LEVEL (UNFRACTIONATED)
Heparin Unfractionated: 0.33 IU/mL (ref 0.30–0.70)
Heparin Unfractionated: 0.26 IU/mL — ABNORMAL LOW (ref 0.30–0.70)

## 2018-08-05 LAB — PREPARE RBC (CROSSMATCH)

## 2018-08-05 LAB — PREALBUMIN: Prealbumin: <5 mg/dL — ABNORMAL LOW (ref 18–38)

## 2018-08-05 LAB — PROTIME-INR
INR: 2 — ABNORMAL HIGH (ref 0.8–1.2)
Prothrombin Time: 22.6 seconds — ABNORMAL HIGH (ref 11.4–15.2)

## 2018-08-05 LAB — MAGNESIUM: Magnesium: 2.4 mg/dL (ref 1.7–2.4)

## 2018-08-05 LAB — MPO/PR-3 (ANCA) ANTIBODIES
ANCA Proteinase 3: <3.5 U/mL (ref 0.0–3.5)
Myeloperoxidase Abs: <9 U/mL (ref 0.0–9.0)

## 2018-08-05 LAB — ANTINUCLEAR ANTIBODIES, IFA: ANA Ab, IFA: NEGATIVE

## 2018-08-05 LAB — PHOSPHORUS: Phosphorus: 3 mg/dL (ref 2.5–4.6)

## 2018-08-05 LAB — LACTATE DEHYDROGENASE: LDH: 1571 U/L — ABNORMAL HIGH (ref 98–192)

## 2018-08-05 LAB — RHEUMATOID FACTOR: Rheumatoid fact SerPl-aCnc: <10 IU/mL (ref 0.0–13.9)

## 2018-08-05 LAB — CMV IGM: CMV IgM: <30 AU/mL (ref 0.0–29.9)

## 2018-08-05 MED ORDER — SODIUM CHLORIDE 0.9% IV SOLUTION: Freq: Once | INTRAVENOUS | Status: DC

## 2018-08-05 MED ORDER — HEPARIN (PORCINE) 25000 UT/250ML-% IV SOLN
1050.0000 [IU]/h | INTRAVENOUS | Status: DC
  Administered 2018-08-05 (×2): 1050 [IU]/h via INTRAVENOUS
  Filled 2018-08-05: qty 250

## 2018-08-05 MED ORDER — SODIUM BICARBONATE 8.4 % IV SOLN
INTRAVENOUS | Status: DC
  Filled 2018-08-05 (×3): qty 50

## 2018-08-05 MED ORDER — TRAVASOL 10 % IV SOLN
INTRAVENOUS | Status: AC
  Administered 2018-08-05: 20:00:00 via INTRAVENOUS
  Filled 2018-08-05: qty 1188

## 2018-08-05 MED ORDER — SODIUM BICARBONATE 8.4 % IV SOLN
INTRAVENOUS | Status: AC
  Administered 2018-08-05: 11:00:00 via INTRAVENOUS
  Filled 2018-08-05 (×2): qty 50

## 2018-08-05 MED ORDER — SODIUM CHLORIDE 0.9% IV SOLUTION
Freq: Once | INTRAVENOUS | Status: AC
  Administered 2018-08-05: 13:00:00 via INTRAVENOUS

## 2018-08-05 NOTE — Progress Notes (Signed)
ANTICOAGULATION CONSULT NOTE - Follow Up Consult  Pharmacy Consult for Heparin Indication: PE  Allergies  Allergen Reactions  . Cephalexin Nausea Only  . Feldene [Piroxicam] Hives, Nausea Only and Other (See Comments)    Makes pt feel loopy    Patient Measurements: Height: 5\' 10"  (177.8 cm) Weight: 213 lb 13.5 oz (97 kg) IBW/kg (Calculated) : 73 Heparin Dosing Weight:   Vital Signs: Temp: 98.1 F (36.7 C) (06/30 0330) Temp Source: Axillary (06/30 0330) BP: 124/64 (06/30 0500) Pulse Rate: 110 (06/30 0500)  Labs: Recent Labs    08/03/18 0508 08/03/18 1630 08/04/18 0307 08/04/18 0858 08/05/18 0023 08/05/18 0210 08/05/18 0533  HGB 8.6*  --  8.0*  --  7.0* 7.0*  --   HCT 29.3*  --  27.6*  --  23.4* 23.6*  --   PLT 22* 18* 12*  --   --  16*  --   LABPROT  --   --   --  22.4*  --  22.6*  --   INR  --   --   --  2.0*  --  2.0*  --   HEPARINUNFRC  --   --   --   --   --   --  0.33  CREATININE 1.96*  --  1.67*  --   --  1.93*  --     Estimated Creatinine Clearance: 36.9 mL/min (A) (by C-G formula based on SCr of 1.93 mg/dL (H)).   Medications:  Infusions:  . ceFEPime (MAXIPIME) IV Stopped (08/04/18 2241)  . heparin 950 Units/hr (08/05/18 0300)  .  sodium bicarbonate  infusion 1000 mL 85 mL/hr at 08/05/18 0539  . TPN ADULT (ION) 40 mL/hr at 08/05/18 0300    Assessment: Patient with heparin level at goal.  No heparin issues noted.  MD noted Hgb and PLT levels.  Goal of Therapy:  Heparin level 0.3-0.7 units/ml Monitor platelets by anticoagulation protocol: Yes   Plan:  Continue heparin drip at current rate Recheck level at [place], [place] 08/05/2018,6:27 AM

## 2018-08-05 NOTE — Progress Notes (Signed)
Patient ID: Luis Cisneros, male   DOB: 08/19/1940, 78 y.o.   MRN: [id number]         [facility] for Infectious Disease  Date of Admission:  07/11/2018   Total days of antibiotics 4        Day 2 cefepime         ASSESSMENT: Mr. Luis Cisneros was recently diagnosed with recurrent pulmonary emboli but that should not explain his severe multisystem decline.  He has elevated liver enzymes, acute kidney injury, normocytic anemia, thrombocytopenia, encephalopathy, splenomegaly and splenic infarcts.  He has monocytosis had an LDH of 1571.  CMV IgM antibody is pending but I would be quite surprised if this is all due to acute cytomegalovirus infection.  I am concerned about lymphoma.  I do not think that he has any treatable bacterial infection and recommend stopping cefepime.  PLAN: 1. Discontinue cefepime and observe off of antibiotics 2. Await results of CMV IgM  Principal Problem:   Elevated liver enzymes Active Problems:   Generalized weakness   Thrombocytopenia (HCC)   Encephalopathy   Splenic infarct   Acute respiratory failure with hypoxemia (HCC)   Shock circulatory (HCC)   Acute kidney injury (HCC)   Normocytic anemia   Unintentional weight loss   PE (pulmonary thromboembolism) (HCC)   Depression   Constipation   Hyponatremia   Lactic acid acidosis   Anasarca   Elevated INR   Hypoalbuminemia   Scheduled Meds: . camphor-menthol   Topical BID  . Chlorhexidine Gluconate Cloth  6 each Topical Daily  . Chlorhexidine Gluconate Cloth  6 each Topical Daily  . hydrocortisone sod succinate (SOLU-CORTEF) inj  50 mg Intravenous Q6H  . Immune Globulin 10%  400 mg/kg Intravenous Q24 Hr x 5  . insulin aspart  0-9 Units Subcutaneous Q4H  . mouth rinse  15 mL Mouth Rinse BID  . sodium chloride flush  10-40 mL Intracatheter Q12H   Continuous Infusions: . ceFEPime (MAXIPIME) IV Stopped (08/05/18 0934)  . heparin 950 Units/hr (08/05/18 0856)  .  sodium bicarbonate  infusion 1000 mL    .  TPN ADULT (ION) 40 mL/hr at 08/05/18 0856  . TPN ADULT (ION)     PRN Meds:.acetaminophen **OR** acetaminophen, bisacodyl, ondansetron **OR** ondansetron (ZOFRAN) IV, sodium chloride flush  Review of Systems: Review of Systems  Unable to perform ROS: Mental acuity    Allergies  Allergen Reactions  . Cephalexin Nausea Only  . Feldene [Piroxicam] Hives, Nausea Only and Other (See Comments)    Makes pt feel loopy    OBJECTIVE: Vitals:   08/05/18 0500 08/05/18 0700 08/05/18 0800 08/05/18 0900  BP: 124/64 119/60  130/64  Pulse: (!) 110 (!) 110 (!) 108 (!) 110  Resp: 17 17 16 17   Temp:   97.8 F (36.6 C)   TempSrc:   Axillary   SpO2: 95% 94% 95% 94%  Weight:      Height:       Body mass index is 30.68 kg/m.  Physical Exam Constitutional:      Comments: He is resting quietly in bed with his eyes open.  Respond to questions or commands.  Cardiovascular:     Rate and Rhythm: Regular rhythm.     Heart sounds: No murmur.     Comments: He is tachycardic. Pulmonary:     Effort: Pulmonary effort is normal.     Breath sounds: Normal breath sounds. No wheezing or rales.  Abdominal:     Palpations: Abdomen is  soft.     Tenderness: There is abdominal tenderness.     Comments: He winces when I palpate his abdomen.  Do not feel any obvious masses.  Musculoskeletal:        General: No swelling.  Skin:    Findings: No rash.     Comments: Scattered ecchymoses.     Lab Results Lab Results  Component Value Date   WBC 13.8 (H) 08/05/2018   HGB 7.0 (L) 08/05/2018   HCT 23.6 (L) 08/05/2018   MCV 89.7 08/05/2018   PLT 16 (LL) 08/05/2018    Lab Results  Component Value Date   CREATININE 1.93 (H) 08/05/2018   BUN 80 (H) 08/05/2018   NA 142 08/05/2018   K 4.1 08/05/2018   CL 115 (H) 08/05/2018   CO2 19 (L) 08/05/2018    Lab Results  Component Value Date   ALT 82 (H) 08/05/2018   AST 217 (H) 08/05/2018   ALKPHOS 110 08/05/2018   BILITOT 6.0 (H) 08/05/2018      Microbiology: Recent Results (from the past 240 hour(s))  Urine culture     Status: Abnormal   Collection Time: 07/27/18 11:46 AM   Specimen: Urine, Random  Result Value Ref Range Status   Specimen Description   Final    URINE, RANDOM Performed at [place], [place] [address], [place], [place] [address]    Special Requests   Final    NONE Performed at [facility], [place] [address], [place], [place] [address]    Culture MULTIPLE SPECIES PRESENT, SUGGEST RECOLLECTION (A)  Final   Report Status 07/28/2018 FINAL  Final  SARS Coronavirus 2 (CEPHEID - Performed in [place] hospital lab), Hosp Order     Status: None   Collection Time: 07/27/18 11:47 AM   Specimen: Nasopharyngeal Swab  Result Value Ref Range Status   SARS Coronavirus 2 NEGATIVE NEGATIVE Final    Comment: (NOTE) If result is NEGATIVE SARS-CoV-2 target nucleic acids are NOT DETECTED. The SARS-CoV-2 RNA is generally detectable in upper and lower  respiratory specimens during the acute phase of infection. The lowest  concentration of SARS-CoV-2 viral copies this assay can detect is 250  copies / mL. A negative result does not preclude SARS-CoV-2 infection  and should not be used as the sole basis for treatment or other  patient management decisions.  A negative result may occur with  improper specimen collection / handling, submission of specimen other  than nasopharyngeal swab, presence of viral mutation(s) within the  areas targeted by this assay, and inadequate number of viral copies  (<250 copies / mL). A negative result must be combined with clinical  observations, patient history, and epidemiological information. If result is POSITIVE SARS-CoV-2 target nucleic acids are DETECTED. The SARS-CoV-2 RNA is generally detectable in upper and lower  respiratory specimens dur ing the acute phase of infection.  Positive  results are indicative of active infection with SARS-CoV-2.   Clinical  correlation with patient history and other diagnostic information is  necessary to determine patient infection status.  Positive results do  not rule out bacterial infection or co-infection with other viruses. If result is PRESUMPTIVE POSTIVE SARS-CoV-2 nucleic acids MAY BE PRESENT.   A presumptive positive result was obtained on the submitted specimen  and confirmed on repeat testing.  While 2019 novel coronavirus  (SARS-CoV-2) nucleic acids may be present in the submitted sample  additional confirmatory testing may be necessary for epidemiological  and / or  clinical management purposes  to differentiate between  SARS-CoV-2 and other Sarbecovirus currently known to infect humans.  If clinically indicated additional testing with an alternate test  methodology [phone]) is advised. The SARS-CoV-2 RNA is generally  detectable in upper and lower respiratory sp ecimens during the acute  phase of infection. The expected result is Negative. Fact Sheet for Patients:  StrictlyIdeas.no Fact Sheet for Healthcare Providers: BankingDealers.co.za This test is not yet approved or cleared by the [place] FDA and has been authorized for detection and/or diagnosis of SARS-CoV-2 by FDA under an Emergency Use Authorization (EUA).  This EUA will remain in effect (meaning this test can be used) for the duration of the COVID-19 declaration under Section 564(b)(1) of the Act, 21 U.S.C. section 360bbb-3(b)(1), unless the authorization is terminated or revoked sooner. Performed at [facility], [place] [address] [place], [place] [address]   Culture, blood (routine x 2)     Status: None   Collection Time: 07/28/18  8:49 PM   Specimen: BLOOD  Result Value Ref Range Status   Specimen Description   Final    BLOOD BLOOD RIGHT HAND Performed at [facility] [address], [place], [place] [address]    Special  Requests   Final    BOTTLES DRAWN AEROBIC AND ANAEROBIC Blood Culture adequate volume Performed at [place] [address], [place], [place] [address]    Culture   Final    NO GROWTH 5 DAYS Performed at [facility], [place] [address] [place], [place] [address]    Report Status 08/02/2018 FINAL  Final  Culture, blood (routine x 2)     Status: None   Collection Time: 07/28/18  8:49 PM   Specimen: BLOOD RIGHT ARM  Result Value Ref Range Status   Specimen Description   Final    BLOOD RIGHT ARM Performed at [facility], [place] [address], [place], [place] [address]    Special Requests   Final    BOTTLES DRAWN AEROBIC AND ANAEROBIC Blood Culture adequate volume Performed at [place] [address] [place], [place] [address]    Culture  Setup Time   Final    GRAM POSITIVE RODS ANAEROBIC BOTTLE ONLY CRITICAL RESULT CALLED TO, READ BACK BY AND VERIFIED WITH: RN D [place] [phone]    Culture   Final    GRAM POSITIVE RODS UNABLE TO FURTHER ID. CALL MICRO LAB IF ID IS NEEDED. Performed at [facility], [place] [address], [place], [place] [address]    Report Status 08/04/2018 FINAL  Final  Culture, blood (Routine x 2)     Status: Abnormal   Collection Time: 07/15/2018  5:09 PM   Specimen: BLOOD LEFT FOREARM  Result Value Ref Range Status   Specimen Description   Final    BLOOD LEFT FOREARM Performed at [place] [address] [place], [place] [address]    Special Requests   Final    BOTTLES DRAWN AEROBIC AND ANAEROBIC Blood Culture adequate volume Performed at [place] [address] [place], [place] [address]    Culture  Setup Time   Final    GRAM POSITIVE COCCI AEROBIC BOTTLE ONLY CRITICAL RESULT CALLED TO, READ BACK BY AND VERIFIED WITH: B GREEN PHARMD 07/31/18 0035 JDW    Culture (A)  Final    MICROCOCCUS LUTEUS/LYLAE THE SIGNIFICANCE OF ISOLATING THIS ORGANISM  FROM A SINGLE  SET OF BLOOD CULTURES WHEN MULTIPLE SETS ARE DRAWN IS UNCERTAIN. PLEASE NOTIFY THE MICROBIOLOGY DEPARTMENT WITHIN ONE WEEK IF SPECIATION AND SENSITIVITIES ARE REQUIRED. Performed at [facility], [place] [address], [place], [place] [address]    Report Status 08/02/2018 FINAL  Final  Blood Culture ID Panel (Reflexed)     Status: None   Collection Time: 07/19/2018  5:09 PM  Result Value Ref Range Status   Enterococcus species NOT DETECTED NOT DETECTED Final   Listeria monocytogenes NOT DETECTED NOT DETECTED Final   Staphylococcus species NOT DETECTED NOT DETECTED Final   Staphylococcus aureus (BCID) NOT DETECTED NOT DETECTED Final   Streptococcus species NOT DETECTED NOT DETECTED Final   Streptococcus agalactiae NOT DETECTED NOT DETECTED Final   Streptococcus pneumoniae NOT DETECTED NOT DETECTED Final   Streptococcus pyogenes NOT DETECTED NOT DETECTED Final   Acinetobacter baumannii NOT DETECTED NOT DETECTED Final   Enterobacteriaceae species NOT DETECTED NOT DETECTED Final   Enterobacter cloacae complex NOT DETECTED NOT DETECTED Final   Escherichia coli NOT DETECTED NOT DETECTED Final   Klebsiella oxytoca NOT DETECTED NOT DETECTED Final   Klebsiella pneumoniae NOT DETECTED NOT DETECTED Final   Proteus species NOT DETECTED NOT DETECTED Final   Serratia marcescens NOT DETECTED NOT DETECTED Final   Haemophilus influenzae NOT DETECTED NOT DETECTED Final   Neisseria meningitidis NOT DETECTED NOT DETECTED Final   Pseudomonas aeruginosa NOT DETECTED NOT DETECTED Final   Candida albicans NOT DETECTED NOT DETECTED Final   Candida glabrata NOT DETECTED NOT DETECTED Final   Candida krusei NOT DETECTED NOT DETECTED Final   Candida parapsilosis NOT DETECTED NOT DETECTED Final   Candida tropicalis NOT DETECTED NOT DETECTED Final    Comment: Performed at [facility], [place] [address], [place], [place] [address]  Culture, blood (Routine x 2)     Status: None   Collection Time:  07/22/2018  5:11 PM   Specimen: BLOOD RIGHT HAND  Result Value Ref Range Status   Specimen Description   Final    BLOOD RIGHT HAND Performed at [facility] [address] [place], [place] [address]    Special Requests   Final    BOTTLES DRAWN AEROBIC AND ANAEROBIC Blood Culture adequate volume Performed at [facility] [address], [place], [place] [address]    Culture   Final    NO GROWTH 5 DAYS Performed at [facility], [place] [address], [place], [place] [address]    Report Status 08/04/2018 FINAL  Final  Novel Coronavirus,NAA,(SEND-OUT TO REF LAB - TAT 24-48 hrs); Hosp Order     Status: None   Collection Time: 07/28/2018  9:42 PM   Specimen: Nasopharyngeal Swab; Respiratory  Result Value Ref Range Status   SARS-CoV-2, NAA NOT DETECTED NOT DETECTED Final    Comment: (NOTE) This test was developed and its performance characteristics determined by [employer]. This test has not been FDA cleared or approved. This test has been authorized by FDA under an Emergency Use Authorization (EUA). This test is only authorized for the duration of time the declaration that circumstances exist justifying the authorization of the emergency use of in vitro diagnostic tests for detection of SARS-CoV-2 virus and/or diagnosis of COVID-19 infection under section 564(b)(1) of the Act, 21 U.S.C. 220URK-2(H)(0), unless the authorization is terminated or revoked sooner. When diagnostic testing is negative, the possibility of a false negative result should be considered in the context of a patient's recent exposures and  the presence of clinical signs and symptoms consistent with COVID-19. An individual without symptoms of COVID-19 and who is not shedding SARS-CoV-2 virus would expect to have a negative (not detected) result in this assay. Performed  At: [facility] [place], [place] [id number] [place] Farmer MD [id number]     [place]  Final    Comment: Performed at [facility] [address] [place], [place] [address]  MRSA PCR Screening     Status: None   Collection Time: 07/21/2018 11:20 PM   Specimen: Nasal Mucosa; Nasopharyngeal  Result Value Ref Range Status   MRSA by PCR NEGATIVE NEGATIVE Final    Comment:        The GeneXpert MRSA Assay (FDA approved for NASAL specimens only), is one component of a comprehensive MRSA colonization surveillance program. It is not intended to diagnose MRSA infection nor to guide or monitor treatment for MRSA infections. Performed at [facility], [place] [address] [place], [place] [address]   Culture, blood (routine x 2)     Status: None (Preliminary result)   Collection Time: 08/01/18  6:03 PM   Specimen: BLOOD  Result Value Ref Range Status   Specimen Description   Final    BLOOD SITE NOT SPECIFIED Performed at [facility] [address], [place], [place] [address]    Special Requests   Final    BOTTLES DRAWN AEROBIC AND ANAEROBIC Blood Culture adequate volume Performed at [place] [address] [place], [place] [address]    Culture   Final    NO GROWTH 4 DAYS Performed at [facility], [place] [address] [place], [place] [address]    Report Status PENDING  Incomplete  Culture, blood (routine x 2)     Status: Abnormal   Collection Time: 08/01/18  6:49 PM   Specimen: BLOOD  Result Value Ref Range Status   Specimen Description   Final    BLOOD RIGHT ANTECUBITAL Performed at [place] [address] [place], [place] [address]    Special Requests   Final    BOTTLES DRAWN AEROBIC ONLY Blood Culture adequate volume Performed at [place] [facility] [place] [address], [place], [place] [address]    Culture  Setup Time   Final    GRAM POSITIVE COCCI IN CLUSTERS AEROBIC BOTTLE ONLY Organism ID to follow CRITICAL RESULT  CALLED TO, READ BACK BY AND VERIFIED WITH: PHARMD WALFORD, D 1743 [id number] FCP    Culture (A)  Final    STAPHYLOCOCCUS SPECIES (COAGULASE NEGATIVE) THE SIGNIFICANCE OF ISOLATING THIS ORGANISM FROM A SINGLE SET OF BLOOD CULTURES WHEN MULTIPLE SETS ARE DRAWN IS UNCERTAIN. PLEASE NOTIFY THE MICROBIOLOGY DEPARTMENT WITHIN ONE WEEK IF SPECIATION AND SENSITIVITIES ARE REQUIRED. Performed at [facility], [place] [address] [place],  [address]    Report Status 08/03/2018 FINAL  Final  Blood Culture ID Panel (Reflexed)     Status: Abnormal   Collection Time: 08/01/18  6:49 PM  Result Value Ref Range Status   Enterococcus species NOT DETECTED NOT DETECTED Final   Listeria monocytogenes NOT DETECTED NOT DETECTED Final   Staphylococcus species DETECTED (A) NOT DETECTED Final    Comment: Methicillin (oxacillin) resistant coagulase negative staphylococcus. Possible blood culture contaminant (unless isolated from more than one blood culture draw or clinical case suggests pathogenicity). No antibiotic treatment is indicated for blood  culture contaminants. CRITICAL RESULT CALLED TO, READ BACK  BY AND VERIFIED WITH: PHARMD WALFORD, D 1743 [id number] FCP    Staphylococcus aureus (BCID) NOT DETECTED NOT DETECTED Final   Methicillin resistance DETECTED (A) NOT DETECTED Final    Comment: CRITICAL RESULT CALLED TO, READ BACK BY AND VERIFIED WITH: PHARMD WALFORD, D [phone] [id number] FCP    Streptococcus species NOT DETECTED NOT DETECTED Final   Streptococcus agalactiae NOT DETECTED NOT DETECTED Final   Streptococcus pneumoniae NOT DETECTED NOT DETECTED Final   Streptococcus pyogenes NOT DETECTED NOT DETECTED Final   Acinetobacter baumannii NOT DETECTED NOT DETECTED Final   Enterobacteriaceae species NOT DETECTED NOT DETECTED Final   Enterobacter cloacae complex NOT DETECTED NOT DETECTED Final   Escherichia coli NOT DETECTED NOT DETECTED Final   Klebsiella oxytoca NOT DETECTED NOT DETECTED Final   Klebsiella  pneumoniae NOT DETECTED NOT DETECTED Final   Proteus species NOT DETECTED NOT DETECTED Final   Serratia marcescens NOT DETECTED NOT DETECTED Final   Haemophilus influenzae NOT DETECTED NOT DETECTED Final   Neisseria meningitidis NOT DETECTED NOT DETECTED Final   Pseudomonas aeruginosa NOT DETECTED NOT DETECTED Final   Candida albicans NOT DETECTED NOT DETECTED Final   Candida glabrata NOT DETECTED NOT DETECTED Final   Candida krusei NOT DETECTED NOT DETECTED Final   Candida parapsilosis NOT DETECTED NOT DETECTED Final   Candida tropicalis NOT DETECTED NOT DETECTED Final    Comment: Performed at [facility], [place] [address] [place], [place] [address]  MRSA PCR Screening     Status: None   Collection Time: 08/03/18 11:45 PM   Specimen: Nasal Mucosa; Nasopharyngeal  Result Value Ref Range Status   MRSA by PCR NEGATIVE NEGATIVE Final    Comment:        The GeneXpert MRSA Assay (FDA approved for NASAL specimens only), is one component of a comprehensive MRSA colonization surveillance program. It is not intended to diagnose MRSA infection nor to guide or monitor treatment for MRSA infections. Performed at [facility], [place] [address] [place], [place] [address]     Michel Bickers, Cook [facility] [place] [facility] [phone] pager   [phone] cell 08/05/2018, 9:47 AM

## 2018-08-05 NOTE — Progress Notes (Signed)
CRITICAL VALUE ALERT  Critical Value: hgb 6.8, platelets 22  Date & Time Notied: 08/05/2018,1550  Provider Notified: Horris Latino MD  Orders Received/Actions taken: Awaiting orders

## 2018-08-05 NOTE — Progress Notes (Signed)
PT Cancellation Note  Patient Details Name: Luis Cisneros MRN: [id number] DOB: 11/04/1940   Cancelled Treatment:    Reason Eval/Treat Not Completed: Medical issues which prohibited therapy Pt in room with eyes closed and appears gasping for breath.  RN reports pt is not alert or following commands at this time.  Will check back as schedule permits.   Trinton Prewitt,KATHrine E 08/05/2018, 10:48 AM Carmelia Bake, PT, DPT Acute Rehabilitation Services Office: [phone] Pager: [phone]

## 2018-08-05 NOTE — Consult Note (Signed)
Referring Provider: No ref. provider found Primary Care Physician:  Elby Showers, MD Primary Nephrologist:      Reason for Consultation: Acute kidney injury, maintenance of euvolemia, treatment and assessment of electrolyte and acid-base abnormalities, evaluation and treatment of anemia.  HPI: This is a very nice 78 year old gentleman who was admitted 07/08/2018 with shortness of breath and fatigue.  He had a prior history of pulmonary embolus in 2006.  In the past 6 months he has been admitted 3 times for weakness confusion fever and failure to thrive.  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  He was placed on Xarelto.  Echo at that time was negative for RV strain.  He had a hypercoagulable work-up that was positive for decreased protein C and protein S.    Readmission 07/27/2018 with weakness confusion and concern for failure to thrive.  Work-up found elevated liver enzymes and worsening thrombocytopenia and HIT antibody test was negative.  Acute hepatitis panel was negative bilirubin was elevated at 3.9.  Abdominal ultrasound showed gallbladder sludge but no stones with 2.2 cm left hepatic lobe cyst.  He had patent portal veins abdominal CT 624 showed progressive splenomegaly suspicious for splenic infarcts.  He also had a 7 mm proximal left ureteral stone without hydronephrosis.  He returns on 624 after being sent improved primary care physician for increased weakness and altered mental status falling will try to get out of the car 623.  He is increased swelling in his lower extremities constipation difficulty in urination confusion intermittent fevers and poor appetite.  According to wife there is an 18 pound weight loss since January.  Home medications vitamin D3 once daily, multivitamins 1 daily rivaroxaban 15 mg daily, tramadol 50 mg daily Wellbutrin 300 mg every 24 hours  Blood pressure 123/58 pulse 107 temperature 98.1 O2 sats 94% 4 L high flow nasal  cannula.  IVIG   started 08/02/2020 08/08/2018  IV sodium bicarbonate 85 cc an hour IV TPN IV Solu-Cortef 50 mg every 6 hours   started 08/04/2018  IV cefepime 2 g every 12 hours  IV contrast administered 6/24 and 07/27/2018  Previous antibiotics included Primaxin 08/02/2018   08/04/2018   rifaximin 07/10/2018 to 07/31/2018 vancomycin 08/01/2018 08/04/2018 and cefepime 08/03/2020 08/05/2018  Labs    sodium 142 potassium 4.1 CO2 19 BUN 80 creatinine 1.93 glucose 168 phosphorus 3.0 magnesium 2.4 phosphorus 3.0 albumin 1.3 AST 217 ALT 82 WBC 13.8 hemoglobin low 7.0 platelets 16.  Urine output nonoliguric positive fluid balance greater than 10 kg weight gain since admission 07/31/2018  RPR nonreactive, coronavirus negative, hepatitis B surface antigen negative hepatitis C negative, HIV negative.  Urinalysis greater than 50 WBCs 08/04/2021 0-5 WBC 08/01/2018 greater than 50 RBCs 08/05/2018 025 07/27/2018 30 mg deciliter proteinuria.  Urine sodium less than 10 07/24/2018 drug screen negative.  Complement C3 56 low and C4 28.  RA latex negative ANA negative.  TSH 0.62 cortisol 20.3 no lupus anticoagulant anticardiolipin antibody negative.  Beta-2 glycoprotein negative.  Antithrombin activity low.  D-dimer elevated fibrinogen normal.  Functional protein C normal.  Functional protein S low.  Negative HIT negative schistocytes  Pending labs ADAMTS anti-GBM and ANCA testing  Chest x-ray showed left lower lobe opacity and stable vascular congestion 08/05/2018  Abdominal x-ray nonobstructive bowel gas pattern 07/31/2018  Echo LV function normal mild increase left ventricular wall thickness.  Trivial pericardial effusion  Abdominal CT scan showed progressive splenomegaly with wedge-shaped areas of decreased  attenuation suspicious for acute or subacute splenic infarcts, proximal left ureteral calculus no significant hydronephrosis with delay of contrast excretion interval development of diffuse soft tissue edema or ascites  and bilateral pleural effusions aortic atherosclerosis    Past Medical History:  Diagnosis Date  . Ankle fracture 2006   right  . Arthritis   . Depression   . HOH (hard of hearing)    wears hearing aides in both ears  . Hyperlipidemia   . Hypertension   . Pulmonary embolism ([place])   . Vitamin D deficiency     Past Surgical History:  Procedure Laterality Date  . TONSILLECTOMY    . 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 Authorizing Provider  buPROPion (WELLBUTRIN XL) 300 MG 24 hr tablet TAKE 1 TABLET BY MOUTH EVERY DAY 07/28/18  Yes Baxley, Cresenciano Lick, MD  camphor-menthol [facility]) lotion Apply 1 application topically 2 (two) times a day.   Yes [provider]  Cholecalciferol (VITAMIN D3) 250 MCG (10000 UT) capsule Take 10,000 Units by mouth daily.   Yes [provider]  feeding supplement, ENSURE ENLIVE, (ENSURE ENLIVE) LIQD Take 237 mLs by mouth daily. 07/29/18  Yes Sheikh, Omair Latif, DO  Multiple Vitamin (MULTIVITAMIN) tablet Take 1 tablet by mouth daily.     Yes [provider]  Rivaroxaban 15 & 20 MG TBPK Take as directed on package: Start with one 15mg  tablet by mouth twice a day with food. On Day 22, switch to one 20mg  tablet once a day with food. Patient taking differently: Take 15-20 mg by mouth See admin instructions. Take as directed on package: Start with 15 mg by mouth twice daily with food. On Day 22, switch to 20 mg daily with food. 07/22/18  Yes Eugenie Filler, MD  traMADol (ULTRAM) 50 MG tablet Take 1 tablet (50 mg total) by mouth every 6 (six) hours as needed. Patient taking differently: Take 50 mg by mouth every 6 (six) hours as needed for moderate pain.  12/19/17  Yes Stefanie Libel, MD   senna (SENOKOT) 8.6 MG TABS tablet Take 1 tablet (8.6 mg total) by mouth at bedtime. Patient not taking: Reported on 08/03/2018 07/22/18   Eugenie Filler, MD    Current Facility-Administered Medications  Medication Dose Route Frequency Provider Last Rate Last Dose  . 0.9 %  sodium chloride infusion (Manually program via Guardrails IV Fluids)   Intravenous Once Mikey Bussing R, NP      . acetaminophen (TYLENOL) tablet 650 mg  650 mg Oral Q6H PRN [name], MD       Or  . acetaminophen (TYLENOL) suppository 650 mg  650 mg Rectal Q6H PRN [name], MD      . bisacodyl (DULCOLAX) suppository 10 mg  10 mg Rectal Daily PRN Ollis, Brandi L, NP      . camphor-menthol (SARNA) lotion   Topical BID Elder Cyphers, NP      . Chlorhexidine Gluconate Cloth 2 % PADS 6 each  6 each Topical Daily Desiree Hane, MD   6 each at 08/05/18 0901  . Chlorhexidine Gluconate Cloth 2 % PADS 6 each  6 each Topical Daily [name], MD   6 each  at 08/05/18 0901  . heparin ADULT infusion 100 units/mL (25000 units/262mL sodium chloride 0.45%)  950 Units/hr Intravenous Continuous Eudelia Bunch, RPH 9.5 mL/hr at 08/05/18 0856 950 Units/hr at 08/05/18 0856  . hydrocortisone sodium succinate (SOLU-CORTEF) 100 MG injection 50 mg  50 mg Intravenous Q6H Ollis, Brandi L, NP   50 mg at 08/05/18 1143  . Immune Globulin 10% (PRIVIGEN) IV infusion 35 g  400 mg/kg Intravenous Q24 Hr x 5 Gudena, Vinay, MD 27.3 mL/hr at 08/04/18 1714 35 g at 08/04/18 1714  . insulin aspart (novoLOG) injection 0-9 Units  0-9 Units Subcutaneous Q4H [name], MD   2 Units at 08/05/18 1137  . MEDLINE mouth rinse  15 mL Mouth Rinse BID [name], MD   15 mL at 08/05/18 0904  . ondansetron (ZOFRAN) tablet 4 mg  4 mg Oral Q6H PRN [name], MD       Or  . ondansetron (ZOFRAN) injection 4 mg  4 mg Intravenous Q6H PRN [name], MD      . sodium bicarbonate 50 mEq in dextrose 5 % and 0.45% NaCl 1,000 mL  infusion   Intravenous Continuous Bell, Michelle T, RPH      . sodium bicarbonate 50 mEq in dextrose 5 % and 0.45% NaCl 1,000 mL infusion   Intravenous Continuous Eudelia Bunch, RPH 85 mL/hr at 08/05/18 1032    . sodium chloride flush (NS) 0.9 % injection 10-40 mL  10-40 mL Intracatheter Q12H [name], MD   10 mL at 08/05/18 0901  . sodium chloride flush (NS) 0.9 % injection 10-40 mL  10-40 mL Intracatheter PRN Desiree Hane, MD      . TPN ADULT (ION)   Intravenous Continuous TPN Eudelia Bunch, RPH 40 mL/hr at 08/05/18 0856    . TPN ADULT (ION)   Intravenous Continuous TPN Eudelia Bunch, RPH        Allergies as of 07/31/2018 - Review Complete 07/25/2018  Allergen Reaction Noted  . Cephalexin Nausea Only 10/13/2010  . Feldene [piroxicam] Hives, Nausea Only, and Other (See Comments) 10/13/2010    Family History  Problem Relation Age of Onset  . Aortic aneurysm Mother   . Heart disease Father     Social History   Socioeconomic History  . Marital status: Married    Spouse name: Not on file  . Number of children: Not on file  . Years of education: Not on file  . Highest education level: Not on file  Occupational History  . Not on file  Social Needs  . Financial resource strain: Not on file  . Food insecurity    Worry: Not on file    Inability: Not on file  . Transportation needs    Medical: Not on file    Non-medical: Not on file  Tobacco Use  . Smoking status: Former Smoker    Packs/day: 1.00    Types: Cigarettes    Quit date: 02/06/1987    Years since quitting: 31.5  . Smokeless tobacco: Never Used  Substance and Sexual Activity  . Alcohol use: Yes    Comment: 1 drink  4-5 days a week  . Drug use: No  . Sexual activity: Not on file  Lifestyle  . Physical activity    Days per week: Not on file    Minutes per session: Not on file  . Stress: Not on file  Relationships  . Social connections  Talks on phone: Not on file    Gets together: Not on  file    Attends religious service: Not on file    Active member of club or organization: Not on file    Attends meetings of clubs or organizations: Not on file    Relationship status: Not on file  . Intimate partner violence    Fear of current or ex partner: Not on file    Emotionally abused: Not on file    Physically abused: Not on file    Forced sexual activity: Not on file  Other Topics Concern  . Not on file  Social History Narrative  . Not on file    Review of Systems: Gen: No fever chills +anorexia malaise weakness and weight loss HEENT: No visual complaints, No history of Retinopathy. Normal external appearance No Epistaxis or Sore throat. No sinusitis.   CV: Denies chest pain, angina, palpitations, syncope, orthopnea, PND, peripheral edema, and claudication. Resp: Denies dyspnea at rest, dyspnea with exercise, cough, sputum, wheezing, coughing up blood, and pleurisy. GI: Transaminitis suspension.  For shock liver GU : Denies urinary burning, blood in urine, urinary frequency, urinary hesitancy, nocturnal urination, and urinary incontinence.  No renal calculi. MS: Denies joint pain, limitation of movement, and swelling, stiffness, low back pain, extremity pain. Denies muscle weakness, cramps, atrophy.  No use of non steroidal antiinflammatory drugs. Derm: Bruising Heme: Severe thrombocytopenia ADAMTS 13 pending     Thrombophilia with low protein C protein S IV heparin Neuro: No headache.  No diplopia. No dysarthria.  No dysphasia.  No history of CVA.  No Seizures. No paresthesias.  No weakness. Endocrine No DM.  No Thyroid disease.  No Adrenal disease.  Physical Exam: Vital signs in last 24 hours: Temp:  [97.5 F (36.4 C)-98.6 F (37 C)] 98.1 F (36.7 C) (06/30 1200) Pulse Rate:  [71-115] 107 (06/30 1200) Resp:  [13-25] 19 (06/30 1200) BP: (113-138)/(58-65) 123/58 (06/30 1200) SpO2:  [91 %-100 %] 93 % (06/30 1200) Arterial Line BP: (96-132)/(39-73) 109/73 (06/29  2200) Weight:  [97 kg] 97 kg (06/30 0305) Last BM Date: 08/05/18 General:   Chronically ill-appearing adult male Head:  Normocephalic and atraumatic. Eyes:  Sclera clear, no icterus.   Conjunctiva pink. Ears:  Normal auditory acuity. Nose:  No deformity, discharge,  or lesions. Mouth:  No deformity or lesions, dentition normal. Neck:  Supple; no masses or thyromegaly. JVP not elevated Lungs:   .  Lung fields are coarse to auscultation Heart:  Regular rate and rhythm; no murmurs, clicks, rubs,  or gallops. Abdomen:  Soft, nontender and nondistended. No masses, hepatosplenomegaly or hernias noted. Normal bowel sounds, without guarding, and without rebound.   Msk:  Symmetrical without gross deformities. Normal posture. Pulses:  No carotid, renal, femoral bruits. DP and PT symmetrical and equal Extremities: 2-3+ bilateral lower extremity edema Neurologic:  Alert and  oriented x4;  grossly normal neurologically. Skin: Multiple bruising   Intake/Output from previous day: 06/29 0701 - 06/30 0700 In: 4855.1 [I.V.:3564.9; Blood:695; IV Piggyback:595.2] Out: 775 [Urine:775] Intake/Output this shift: Total I/O In: 784.8 [I.V.:784.8] Out: -   Lab Results: Recent Labs    08/03/18 0508 08/03/18 1630 08/04/18 0307 08/05/18 0023 08/05/18 0210  WBC 22.4*  --  10.6*  --  13.8*  HGB 8.6*  --  8.0* 7.0* 7.0*  HCT 29.3*  --  27.6* 23.4* 23.6*  PLT 22* 18* 12*  --  16*   BMET Recent Labs    08/03/18  0932 08/04/18 0307 08/05/18 0210  NA 138 140 142  K 4.8 3.7 4.1  CL 114* 116* 115*  CO2 14* 14* 19*  GLUCOSE 211* 128* 168*  BUN 66* 70* 80*  CREATININE 1.96* 1.67* 1.93*  CALCIUM 6.8* 6.8* 6.9*  PHOS  --   --  3.0   LFT Recent Labs    08/05/18 0210  PROT 4.9*  ALBUMIN 1.3*  AST 217*  ALT 82*  ALKPHOS 110  BILITOT 6.0*   PT/INR Recent Labs    08/04/18 0858 08/05/18 0210  LABPROT 22.4* 22.6*  INR 2.0* 2.0*   Hepatitis Panel No results for input(s): HEPBSAG, HCVAB,  HEPAIGM, HEPBIGM in the last 72 hours.  Studies/Results: Dg Chest Port 1 View  Result Date: 08/05/2018 CLINICAL DATA:  Respiratory failure.  Hypoxia. EXAM: PORTABLE CHEST 1 VIEW COMPARISON:  08/04/2018.  Six hundred twenty-seven 2020 FINDINGS: Left subclavian line in stable position. Cardiomegaly with mild pulmonary venous congestion. Mild bilateral interstitial prominence. Mild left base infiltrate remains. Tiny left pleural effusion cannot be excluded. No pneumothorax. Degenerative change thoracic spine. IMPRESSION: 1.  Left subclavian line stable position. 2. Cardiomegaly with mild pulmonary venous congestion and mild bilateral interstitial prominence. Tiny left pleural effusion cannot be excluded. Mild CHF cannot be excluded. 3.  Mild left base infiltrate remains.  No change. Electronically Signed   By: Marcello Moores  Register   On: 08/05/2018 05:47   Dg Chest Port 1 View  Result Date: 08/04/2018 CLINICAL DATA:  Acute respiratory failure with hypoxemia EXAM: PORTABLE CHEST 1 VIEW COMPARISON:  08/02/2018 FINDINGS: Left central line remains in place, unchanged. Left lower lobe airspace opacity again noted, unchanged. Mild vascular congestion. Heart is borderline in size. No visible effusions or acute bony abnormality. IMPRESSION: Stable left lower lobe opacity and vascular congestion. Electronically Signed   By: Rolm Baptise M.D.   On: 08/04/2018 03:53    Assessment/Plan:  Acute kidney injury.  No evidence of hydronephrosis CT scan although does have a 7 mm calculus noted.  Urinalysis was initially clear but now has white cells and red blood cells.  I think this would be inconsistent with a glomerulonephritis.  This could also indicate acute interstitial nephritis from the use of previous antibiotics.  Does appear that there has been some drops in systolic blood pressure.  There was also use of intravenous contrast 6/21 and 07/21/2018.  So the differential would include acute tubular necrosis.  His urine  sodium less than 10 would suggest this.  There is also evidence of liver injury and hepatorenal syndrome of course would be in the differential.  I have discussed this with Noe Gens with critical care medicine.  We will withdrawal nephrotoxins stop antibiotics avoid the use of contrast and nonsteroidal anti-inflammatory drugs.  Avoid intra-arterial procedures.  It may be worth while doing TEE.  He is receiving IV steroids.  Anti-GBM and ANCA tests are pending.  I do not really have any more to add.  The timing of his acute kidney injury appears to be coincident with systemic hypotension.  We shall wait and see what the serological evaluation shows.  This does not look to be consistent with HUS/TTP with no schistocytes seen on the peripheral smear.  Repeat urinalysis is pending  Shock 2D echo does not reveal any evidence of congestive heart failure hepatic steatosis seen on CT scan may indicate some decompensated liver disease.  Does not appear to have sepsis and cultures have been negative.  The does not appear to  be adrenal insufficiency  Anemia followed by hematology  Thrombocytopenia with splenomegaly with splenic infarcts would suggest infectious etiology although blood cultures have been negative.  This is reassuring.  Splenomegaly appears the infectious etiologies have been ruled out could be chronic splenomegaly from myeloproliferative disorder or underlying liver disease.  Thank you very much this is most interesting consult   LOS: [place] @TODAY @12 :21 PM

## 2018-08-05 NOTE — Progress Notes (Addendum)
[facility] Progress Note  Chief Complaint: elevated LFTs  History:  Patient remains about clinically unchanged from yesterday.  Still confused, critically ill in ICU.  TPN started as requested. Patient unable to give Hx or ROS due to altered mental status   Objective:   Current Facility-Administered Medications:  .  acetaminophen (TYLENOL) tablet 650 mg, 650 mg, Oral, Q6H PRN **OR** acetaminophen (TYLENOL) suppository 650 mg, 650 mg, Rectal, Q6H PRN, [name], MD .  bisacodyl (DULCOLAX) suppository 10 mg, 10 mg, Rectal, Daily PRN, Ollis, Brandi L, NP .  camphor-menthol (SARNA) lotion, , Topical, BID, Kyere, Belinda K, NP .  ceFEPIme (MAXIPIME) 2 g in sodium chloride 0.9 % 100 mL IVPB, 2 g, Intravenous, Q12H, Eudelia Bunch, RPH, Stopped at 08/04/18 2241 .  Chlorhexidine Gluconate Cloth 2 % PADS 6 each, 6 each, Topical, Daily, Desiree Hane, MD, 6 each at 08/03/18 [phone] .  Chlorhexidine Gluconate Cloth 2 % PADS 6 each, 6 each, Topical, Daily, [name], MD, 6 each at 08/04/18 2003 .  heparin ADULT infusion 100 units/mL (25000 units/211mL sodium chloride 0.45%), 950 Units/hr, Intravenous, Continuous, Bell, Ronaldo Miyamoto, RPH, Last Rate: 9.5 mL/hr at 08/05/18 0800, 950 Units/hr at 08/05/18 0800 .  hydrocortisone sodium succinate (SOLU-CORTEF) 100 MG injection 50 mg, 50 mg, Intravenous, Q6H, Ollis, Brandi L, NP, 50 mg at 08/05/18 0532 .  Immune Globulin 10% (PRIVIGEN) IV infusion 35 g, 400 mg/kg, Intravenous, Q24 Hr x 5, Gudena, Vinay, MD, Last Rate: 27.3 mL/hr at 08/04/18 1714, 35 g at 08/04/18 1714 .  insulin aspart (novoLOG) injection 0-9 Units, 0-9 Units, Subcutaneous, Q4H, [name], MD, 2 Units at 08/05/18 [phone] .  MEDLINE mouth rinse, 15 mL, Mouth Rinse, BID, [name], MD, 15 mL at 08/04/18 2215 .  ondansetron (ZOFRAN) tablet 4 mg, 4 mg, Oral, Q6H PRN **OR** ondansetron (ZOFRAN) injection 4 mg, 4 mg, Intravenous, Q6H PRN, Patel, [name], MD .  sodium  bicarbonate 50 mEq in dextrose 5 % and 0.45% NaCl 1,000 mL infusion, , Intravenous, Continuous, Bell, Michelle T, RPH .  sodium chloride flush (NS) 0.9 % injection 10-40 mL, 10-40 mL, Intracatheter, Q12H, [name], MD, 10 mL at 08/04/18 2215 .  sodium chloride flush (NS) 0.9 % injection 10-40 mL, 10-40 mL, Intracatheter, PRN, [name], MD .  TPN ADULT (ION), , Intravenous, Continuous TPN, Eudelia Bunch, RPH, Last Rate: 40 mL/hr at 08/05/18 0800 .  TPN ADULT (ION), , Intravenous, Continuous TPN, BellRonaldo Miyamoto, RPH  . ceFEPime (MAXIPIME) IV Stopped (08/04/18 2241)  . heparin 950 Units/hr (08/05/18 0800)  .  sodium bicarbonate  infusion 1000 mL    . TPN ADULT (ION) 40 mL/hr at 08/05/18 0800  . TPN ADULT (ION)       Vital signs in last 24 hrs: Vitals:   08/05/18 0700 08/05/18 0800  BP: 119/60   Pulse: (!) 110 (!) 108  Resp: 17 16  Temp:  97.8 F (36.6 C)  SpO2: 94% 95%    Intake/Output Summary (Last 24 hours) at 08/05/2018 0839 Last data filed at 08/05/2018 0800 Gross per 24 hour  Intake 3702.7 ml  Output 775 ml  Net 2927.7 ml     Physical Exam Ill-appearing.  Moaning, reaching out.  Left SV CVC and right PICC. IV heparin running, no pressors, Abx going  HEENT: sclera anicteric, oral mucosa dry - not well-visualized  Neck: supple, no thyromegaly, JVD or lymphadenopathy  Cardiac:tachycardic without appreciable murmurs,+  anasarca  Pulm: fair inspiratory effort, not coughing  Abdomen: soft, possible upper abd tenderness, with active bowel sounds. No guarding or palpable hepatosplenomegaly  Skin; warm and dry, + jaundice  Recent Labs:  CBC Latest Ref Rng & Units 08/05/2018 08/05/2018 08/04/2018  WBC 4.0 - 10.5 K/uL 13.8(H) - 10.6(H)  Hemoglobin 13.0 - 17.0 g/dL 7.0(L) 7.0(L) 8.0(L)  Hematocrit 39.0 - 52.0 % 23.6(L) 23.4(L) 27.6(L)  Platelets 150 - 400 K/uL 16(LL) - 12(LL)    Recent Labs  Lab 08/05/18 0210  INR 2.0*   CMP Latest Ref Rng & Units  08/05/2018 08/04/2018 08/03/2018  Glucose 70 - 99 mg/dL 168(H) 128(H) 211(H)  BUN 8 - 23 mg/dL 80(H) 70(H) 66(H)  Creatinine 0.61 - 1.24 mg/dL 1.93(H) 1.67(H) 1.96(H)  Sodium 135 - 145 mmol/L 142 140 138  Potassium 3.5 - 5.1 mmol/L 4.1 3.7 4.8  Chloride 98 - 111 mmol/L 115(H) 116(H) 114(H)  CO2 22 - 32 mmol/L 19(L) 14(L) 14(L)  Calcium 8.9 - 10.3 mg/dL 6.9(L) 6.8(L) 6.8(L)  Total Protein 6.5 - 8.1 g/dL 4.9(L) 4.5(L) 4.2(L)  Total Bilirubin 0.3 - 1.2 mg/dL 6.0(H) 5.6(H) 3.9(H)  Alkaline Phos 38 - 126 U/L 110 127(H) 158(H)  AST 15 - 41 U/L 217(H) 482(H) 964(H)  ALT 0 - 44 U/L 82(H) 140(H) 186(H)     @ASSESSMENTPLANBEGIN @ Assessment: Elevated LFTs - multifactorial, suspected mostly ischemic hepatopathy in setting of critical illness and recent hypotension. AST/ALT improving, bilirubin rising, to be expected in this process.  INR elevated, stable from yesterday.  Elevated INR  Anasarca from severe protein calorie malnutrition /hypoalbuminemia  ID consult note from yesterday reviewed - no clear infectious source.  Cause of his multisystem severe illness remains a mystery.  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  Agree that palliative care should be considered.  Plan: Continue TPN if high level care continues.  His severe thrombocytopenia and coagulopathy along with altered mental status preclude safe ND tube placement, in my opinion.  GI service will sign off, as we unfortunately have nothing more to offer at this point.  (Total time 25 minutes)  Nelida Meuse III Office: [phone]

## 2018-08-05 NOTE — Progress Notes (Signed)
ANTICOAGULATION CONSULT NOTE - follow up Pharmacy Consult for Lovenox>>>UFH Indication: pulmonary embolus  Allergies  Allergen Reactions  . Cephalexin Nausea Only  . Feldene [Piroxicam] Hives, Nausea Only and Other (See Comments)    Makes pt feel loopy    Patient Measurements: Height: 5\' 10"  (177.8 cm) Weight: 213 lb 13.5 oz (97 kg) IBW/kg (Calculated) : 73   Vital Signs: Temp: 98.4 F (36.9 C) (06/30 1409) Temp Source: Axillary (06/30 1409) BP: 131/62 (06/30 1409) Pulse Rate: 110 (06/30 1409)  Labs: Recent Labs    08/03/18 0508 08/03/18 1630 08/04/18 0307 08/04/18 0858 08/05/18 0023 08/05/18 0210 08/05/18 0533 08/05/18 1415  HGB 8.6*  --  8.0*  --  7.0* 7.0*  --   --   HCT 29.3*  --  27.6*  --  23.4* 23.6*  --   --   PLT 22* 18* 12*  --   --  16*  --   --   LABPROT  --   --   --  22.4*  --  22.6*  --   --   INR  --   --   --  2.0*  --  2.0*  --   --   HEPARINUNFRC  --   --   --   --   --   --  0.33 0.26*  CREATININE 1.96*  --  1.67*  --   --  1.93*  --   --     Assessment: 41 yoM recently [facility] now with weakness and lethargy. On PTA xarelto for recent PE. Switching to Lovenox per Hematology recommendations with Pharmacy to dose.   6/29 LMWH changed to UFH due to severe thrombocytopenia and AKI   08/05/2018   CBC: Hgb down to 7; Plt 16 after 2 U PLTC 6/29  HIT Ab negative Lupus anticoag negative  given platelets by heme 6/28 for low platelets  IVIG started 6/28 x 5 days by heme for low platelets  SCr: 1.93 , CrCl ~ 37 ml/min  Previous anticoagulation: Xarelto 15 mg bid, LD 6/24 AM  Confirmatory heparin level is below goal at 0.26 on heparin at 950 units/hr  No active bleeding reported   Goal of Therapy: Heparin level 0.3-0.7 units/ml Monitor platelets by anticoagulation protocol: Yes  Plan:  Increase heparin drip to 1050 units/hr and check 8hr HL Daily CBC and heparin level  Eudelia Bunch, Pharm.D 08/05/2018 3:04  PM

## 2018-08-05 NOTE — Progress Notes (Signed)
Assisted patient to facetime with his wife. This RN updated wife and per wife request, will call around 5pm to update again.

## 2018-08-05 NOTE — Progress Notes (Signed)
PULMONARY / CRITICAL CARE MEDICINE   NAME:  Luis Cisneros, MRN:  [id number], DOB:  06-08-1940, LOS: 6 ADMISSION DATE:  07/25/2018, CONSULTATION DATE:  08/01/2018 REFERRING MD:  Nettey/ Triad, CHIEF COMPLAINT:  weak  BRIEF HISTORY:    78 y/o M, former smoker, admitted 6/24 per [facility] with SOB and fatigue.    He has a prior history of PE (2006).  The patient has had 3 admits in the last 6 months - 6/12 to 6/16 and 6/21 to 6/23 for weakness, confusion, fever and failure to thrive. 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  ECHO at that time was negative for RV strain. Hypercoaguable work-up was notable for decreased protein S and protein C.   Re-admitted 6/21 with weakness, confusion and concern for failure to thrive.  Work up found elevated LFT's and worsening thrombocytopenia. HIT antibody negative.  Acute hepatitis panel negative.  T. Bili 3.9.  Abd US showed gallbladder sludge, no stones, CBD 62mm, hepatic steatosis vs hepatocellular disease, 2.2 cm left lobe cyst, patent portal vein. CT ABD/Pelvis 6/24 showed progressive splenomegaly suspicious for splenic infarcts & a 67mm proximal left ureter stone without hydronephrosis.     He returned 6/24 from his PCP for increased weakness and altered mental status. He reportedly fell while trying to get out of the car on 6/23.  Wife reported increased swelling in LE's, constipation, difficulty initiating urination, confusion, intermittent fevers and poor appetite. Additionally, she has noted some spotting of blood on tissue when patient had BM but no other bleeding.  Wife reports  18 lb weight loss since January. 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     SIGNIFICANT PAST MEDICAL HISTORY   HTN, hyperlipidemia, depression, hypercoagulability / DVT  SIGNIFICANT EVENTS:  6/24 Admit with weakness, fatigue / AMS 6/27 hypotensive, no response to fluids/ alb bolus > neo then levophed/versed  and stress HC  STUDIES:   CT Head 6/24 >> progressive splenomegaly with wedge-shaped areas of decreased attenuation peripherally suspicious for subacute splenic infarcts, unchanged position of left ureteral calculus, no significant hydronephrosis, interval development of diffuse soft tissue edema, ascites and bilateral pleural effusions consistent with anasarca.  CT ABD/Pelvis 6/24 >> negative  ECHO 6/26 >> unremarkable with RAP est 10 mmhg and nl RV /LV fxn, no septal defect  CULTURES:  [facility] 07/19/2018 >> 1/2 micrococcus Luteus/ Lylae  BCx2 6/24 >>  BCID 6/26 >> coag neg staph COVID 6/24 >> negative  MRSA 6/24 >> negative  BCx2 6/26 >> coag neg staph 1/2  BCx2 6/26 >> negative CMV IgM 6/29 >>  UA 6/30 >>   AUTOIMMUNE:  ADAMS13 6/29 >>  Coombs 6/29 >> negative  ANA 6/29 >>  Anti-DNA 6/29 >>  CCP 6/29 >>  RA 6/29 >> negative ANCA 6/29 >>  Sed Rate 6/29 >> 9  C3 6/29 >> 56  C4 6/30 >> 28 Anti GBM 6/29 >> IgG 6/30 >>   ANTIBIOTICS:  Rafixamin 6/24 > 6/25 Primaxin 6/27 >> 6/29 Vanc 6/26 >> 6/29 Cefepime 6/29 >> 6/30  LINES/TUBES:  L radial Art line 6/27 >> L [place] CVL 6/27 >>   CONSULTANTS:  Hematology GI   Cardiology 6/26  PCCM 6/26  Nephrology 6/30   SUBJECTIVE:  Remains on bicarb gtt. No acute events overnight.  CVP 8.   CONSTITUTIONAL: BP 130/64   Pulse (!) 110   Temp 97.8 F (36.6 C) (  Axillary)   Resp 17   Ht 5\' 10"  (1.778 m)   Wt 97 kg   SpO2 94%   BMI 30.68 kg/m      Intake/Output Summary (Last 24 hours) at 08/05/2018 1610 Last data filed at 08/05/2018 0856 Gross per 24 hour  Intake 3820.96 ml  Output 775 ml  Net 3045.96 ml    CVP:  [4 mmHg-8 mmHg] 8 mmHg    PHYSICAL EXAM: General: chronically ill appearing adult male  HEENT: MM pink/dry, [place] O2 Neuro: opens eyes to voice, attempts to answer, HOH CV: s1s2 rrr, no m/r/g, ST on monitor  PULM: even/non-labored, lungs bilaterally coarse  RU:EAVW, non-tender, bsx4 active  Extremities: warm/dry,  trace to 1+ generalized edema in UE's, 2-3+ pitting BLE Skin: multiple areas of bruising   RESOLVED PROBLEM LIST    ASSESSMENT AND PLAN    Altered Mental Status  -in setting of AKI, unexplained hematologic dysfunction, no evidence of bacterial infection P: Follow serial neuro exams  Minimize sedating medications  Ensure adequate perfusion / MAP   Fever Shock with MODS -blood cultures concerning for contaminant, only hardware is left hip which is remote -NOTE BLOOD CULTURES 1/4 with coag neg staph -possible GI source ?, TTP / HUS, malignancy  P:  Follow cultures  Reviewed abx with ID, will stop and monitor for now  CVP goal >10  Stress dose steroids   Elevated LFT's  Hyperammoniemia  Suspected Shock Liver  Severe Thrombocytopenia  -elevated LDH, enlarged spleen on CT, negative schistocytes on DIC panel, HIT negative, acute hepatitis panel negative, GB sludge but no stones, patent portal vein, HIV negative, EBV negative, GGT 83.  -Protein C deficiency noted on prior labs  -? Myeloproliferative disorder, hematologic malignancy, TTP ? Normocytic Anemia  P: GI signed off  Avoid hepatotoxic agents  Trend LDH  Continue heparin gtt given AKI / uremia and thrombocytopenia with risk of bleeidng  Platelets, IVIG per HEME / ONC   AKI  -urine Na <10, consistent with pre-renal failure  AG Metabolic Acidosis  -progressive lactic acidosis with hypotension, worsening renal function. Thrombocytopenia & splenomegaly not likely due to passive congestion based on ECHO findings 6/26  P: Continue bicarbonate gtt Nephrology consulted, appreciate input  Trend BMP / urinary output Replace electrolytes as indicated Avoid nephrotoxic agents, ensure adequate renal perfusion Await ADAMS13 / autoimmune testing   Acute Hypoxic Respiratory Failure  -secondary to PE with concern for infarct of LLL  Pulmonary Embolism (L), DVT  P: Wean O2 for sats >90% Heparin gtt as above   Multiple  Pulmonary Nodules P: Follow as outpatient   Moderate to Severe Protein Calorie Malnutrition  P: TPN per pharmacy    Family Discussion:  Wife updated via phone.  Reviewed current lab data, additional work up and changes to plan of care for the day.  Discussed Nephrology consultation indications.  Palliative care consult pending. Reviewed the concept of mechanical ventilation for short term support, the concept of CPR / mechanical ventilation in combination.  I supported short term intubation if needed to allow for additional work up.  In the event of arrest, I advised against CPR/ventilation out of concern for overall prognosis for recovery and risks of bleeding. Time spent with discussion >58 minutes.   LABS   Glucose Recent Labs  Lab 08/04/18 1157 08/04/18 1613 08/04/18 1936 08/04/18 2349 08/05/18 0331 08/05/18 0726  GLUCAP 117* 89 128* 151* 170* 167*    BMET Recent Labs  Lab 08/03/18 0981 08/04/18 0307 08/05/18  0210  NA 138 140 142  K 4.8 3.7 4.1  CL 114* 116* 115*  CO2 14* 14* 19*  BUN 66* 70* 80*  CREATININE 1.96* 1.67* 1.93*  GLUCOSE 211* 128* 168*    Liver Enzymes Recent Labs  Lab 08/03/18 0508 08/04/18 0307 08/05/18 0210  AST 964* 482* 217*  ALT 186* 140* 82*  ALKPHOS 158* 127* 110  BILITOT 3.9* 5.6* 6.0*  ALBUMIN 1.6* 1.4* 1.3*    Electrolytes Recent Labs  Lab 07/31/18 0212  08/03/18 0508 08/04/18 0307 08/05/18 0210  CALCIUM 7.9*   < > 6.8* 6.8* 6.9*  MG 2.1  --   --   --  2.4  PHOS 1.7*  --   --   --  3.0   < > = values in this interval not displayed.    CBC Recent Labs  Lab 08/03/18 0508 08/03/18 1630 08/04/18 0307 08/05/18 0023 08/05/18 0210  WBC 22.4*  --  10.6*  --  13.8*  HGB 8.6*  --  8.0* 7.0* 7.0*  HCT 29.3*  --  27.6* 23.4* 23.6*  PLT 22* 18* 12*  --  16*    ABG Recent Labs  Lab 08/01/18 1508 08/02/18 0425  PHART 7.379 7.345*  PCO2ART 26.2* 21.0*  PO2ART 71.9* 65.5*    Coag's Recent Labs  Lab 08/01/18 1035  08/04/18 0858 08/05/18 0210  APTT 61*  --   --   INR 1.7* 2.0* 2.0*    Sepsis Markers Recent Labs  Lab 08/02/18 0130 08/02/18 0642 08/02/18 0919 08/02/18 1040 08/03/18 0508 08/04/18 0307  LATICACIDVEN 8.4* 8.7*  --  8.6*  --   --   PROCALCITON  --   --  7.58  --  8.20 7.36    CC Time: 50 minutes   Noe Gens, NP-C [facility] Pgr: [phone] or if no answer [phone] 08/05/2018, 9:27 AM

## 2018-08-05 NOTE — Progress Notes (Signed)
eLink Physician-Brief Progress Note Patient Name: Luis Cisneros DOB: 1941-01-10 MRN: [id number]   Date of Service  08/05/2018  HPI/Events of Note  AM labs reviewed. Stable overall. Hg stable at 7. Plt 16 K. Corrected calcium for low albumin ok.  eICU Interventions  - continue care - transfuse plt if < 10 K. On IV IgG. ID notes seen. Work up for HIT/TTP neg.      Intervention Category Intermediate Interventions: Diagnostic test evaluation  Elmer Sow 08/05/2018, 5:10 AM

## 2018-08-05 NOTE — Progress Notes (Signed)
[place] NOTE   Pharmacy Consult for TPN Indication: critically ill - altered mental status, elevated INR and low platelets preclude enteral tube feed placement  Patient Measurements: Height: 5\' 10"  (177.8 cm) Weight: 213 lb 13.5 oz (97 kg) IBW/kg (Calculated) : 73 TPN AdjBW (KG): 82.1 Body mass index is 30.68 kg/m. Usual Weight:   Current Nutrition: NPO, TPN  IVF: D545NS 1 amp bicarb at 79 ml/hr  Central access: has triple lumen CVC placed 08/02/18 TPN start date: 08/04/18  ASSESSMENT                                                                                                          HPI: 78 year-old male with medical history significant for recently diagnosed pulmonary embolus and BLE DVTs on Xarelto, HTN, hyperlipidemia, depression, and hard of hearing. He presented to the ED for evaluation of generalized weakness, confusion, abdominal distention. Patient was recently admitted from 6/12-6/16 for a L segmental pulmonary embolus and BLE DVTs. He was discharged on home supplemental oxygen due to persistent hypoxia while admitted. He was re-admitted 6/21-6/23 for generalized weakness/confusion and lethargy. Repeat CTA chest showed persistent known PE. He returned to the ED on 6/24 per PCP recommendation.  Significant events: critically ill with MSOF, off pressors.   Today:    Glucose: 6 U SSI/24 hrs, on solucortef 100 IV q6h, CBGs 128, 151, 170 after TPN started at 40 ml/hr  Electrolytes: WNL, CoCa 9.06  Renal:AKI, Scr 1.93, BUN 80;  I/O + 4080, UOP 775/24  hrs  LFTs: elevated LFTs, GI "may have originally started with passive hepatic congestion/right heart strain from pulmonary embolism, now appears to be largely due to systemic illness and ischemic hepatopathy.  Transaminases are improving, but the bilirubin is expected to rise over the next few days, as is typical with this scenario"  TGs: 174 6/30  Prealbumin: < 5 on 6/26 & 6/30,  probably due to critical illness  NUTRITIONAL GOALS                                                                                             RD recs: 08/03/18 Kcal:  3335-4562 kcal Protein:  102-120 grams Fluid:  >/= 2.1 L/day  Custom TPN at goal rate of 90 ml/hr provides: -  118.8 g/day protein (55 g/L) - 64..8 g/day Lipid   (30 g/L) - 324  g/day Dextrose (15 %) -  2225 Kcal/day  PLAN  At 1800 today:  Increase TPN to goal rate of 90 ml/hr with no lipids  withhold lipids for first week in ICU (today is admission D#6), will hold 1 more day then start lipids on 7/1 per critical care guidelines  Electrolytes in TPN: Standard x increased Ca  Cl:Ac ratio max acetate  TPN to contain standard multivitamins and trace elements on MWF only due to shortage  Reduce IVF to 35 ml/hr to keep total IVF 125 or per MD  Add 20 units insulin to TPN; Continue current SSI .   TPN lab panels on Mondays & Thursdays. BMET, Mg, Phos in AM  F/u daily.  Eudelia Bunch, Pharm.D 08/05/2018 7:25 AM

## 2018-08-05 NOTE — Progress Notes (Signed)
PROGRESS NOTE  Luis Cisneros NKN:[id number] DOB: [birth year] DOA: 07/14/2018 PCP: Elby Showers, MD  HPI/Recap of past 24 hours: HPI from critical care team 78 y/o M, former smoker, admitted 6/24 per [facility] with SOB and fatigue. Pt has a prior history of PE (2006). Patient has had 3 admits in the last 6 months - 6/12 to 6/16 and 6/21 to 6/23 for weakness, confusion, fever and failure to thrive. 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  ECHO at that time was negative for RV strain. Hypercoaguable work-up was notable for decreased protein S and protein C.Re-admitted 6/21 with weakness, confusion and concern for failure to thrive.  Work up found elevated LFT's and worsening thrombocytopenia. HIT antibody negative.  Acute hepatitis panel negative.  T. Bili 3.9.  Abd US showed gallbladder sludge, no stones, CBD 24mm, hepatic steatosis vs hepatocellular disease, 2.2 cm left lobe cyst, patent portal vein. CT ABD/Pelvis 6/24 showed progressive splenomegaly suspicious for splenic infarcts & a 56mm proximal left ureter stone without hydronephrosis. Pt returned 6/24 from his PCP for increased weakness and altered mental status. He reportedly fell while trying to get out of the car on 6/23. Wife reported increased swelling in LE's, constipation, difficulty initiating urination, confusion, intermittent fevers and poor appetite. Additionally, she has noted some spotting of blood on tissue when patient had BM but no other bleeding.  Patient admitted for further management by [facility].    On 08/01/2018 patient noted to be hypotensive and critical team was consulted.  Patient was subsequently placed on pressors, with improvement in his blood pressure but worsening of overall condition.  Patient was then transferred back to [facility] 08/05/2018.  Of note, patient current status is very critical, with multiorgan failure and currently minimally responsive.  Of note, critical care, heme-onc,  ID currently on board.  Awaiting palliative consult  Assessment/Plan: Principal Problem:   Elevated liver enzymes Active Problems:   PE (pulmonary thromboembolism) (HCC)   Depression   Generalized weakness   Constipation   Hyponatremia   Thrombocytopenia (HCC)   Encephalopathy   Lactic acid acidosis   Anasarca   Elevated INR   Splenic infarct   Hypoalbuminemia   Acute respiratory failure with hypoxemia (HCC)   Shock circulatory (HCC)   Acute kidney injury (HCC)   Normocytic anemia   Unintentional weight loss  Acute metabolic encephalopathy/MODS Unclear etiology Patient minimally responsive, with very poor prognosis Likely due to multiorgan dysfunction 2/2 ??  Sepsis, DIC/TTP/HUS, renal failure, liver failure, ??  Malignancy (ID suspects ?lymphoma) Currently afebrile, with leukocytosis (on stress dose steroids) Blood culture with possible contaminant (ID on board, agrees with contaminant, stopped IV cefepime) COVID negative UA with some leukocytes, WBC, UC pending Chest x-ray with some mild left base infiltrate, some mild pulmonary vascular congestion CT head unremarkable Continue stress dose steroids Appreciate ID, PCCM, heme-onc consult Guarded prognosis, monitor closely Palliative consult pending  Severe thrombocytopenia/normocytic anemia No active bleeding Unclear etiology, ??  TTP/HUS, ??  Underlying malignancy Negative schistocytes on DIC panel, HIT negative ADAMTS 13 activity pending (?TTP) Continue IVIG, transfuse platelets/PRBC as needed as per heme-onc Appreciate heme-onc recs Minimize procedures to prevent significant bleeding   Elevated LFT's/Hyperammoniemia Unclear etiology, ??malignancy (Myeloproliferative disorder, hematologic malignancy) Elevated LDH, enlarged spleen on CT, acute hepatitis panel negative, GB sludge but no stones, patent portal vein, HIV negative, EBV negative, GGT 83 GI signed off  Daily LFTs  AKI Worsening Continue  bicarbonate  gtt Nephrology consulted by PCCM, appreciate input  Daily BMP Strict I's and O's  Acute Hypoxic Respiratory Failure  ?? 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, ??underlying malignancy  Continue heparin gtt given AKI / uremia and thrombocytopenia with risk of bleeidng   Moderate to Severe Protein Calorie Malnutrition  TPN per pharmacy   [place] Palliative consulted        Malnutrition Type:  Nutrition Problem: Increased nutrient needs Etiology: acute illness, other (see comment)(multiple hospitalizations in 2 weeks)   Malnutrition Characteristics:  Signs/Symptoms: estimated needs   Nutrition Interventions:  Interventions: TPN    Estimated body mass index is 30.68 kg/m as calculated from the following:   Height as of this encounter: 5\' 10"  (1.778 m).   Weight as of this encounter: 97 kg.     Code Status: Full  Family Communication: None at bedside, PCCM discussing in details with wife  Disposition Plan: To be determined   Consultants:  PCCM  ID  GI  Heme-onc  Procedures:  None  Antimicrobials:  Currently none  DVT prophylaxis: IV heparin   Objective: Vitals:   08/05/18 0800 08/05/18 0900 08/05/18 1000 08/05/18 1100  BP:  130/64 134/65 125/65  Pulse: (!) 108 (!) 110 (!) 107 (!) 109  Resp: 16 17 17 20   Temp: 97.8 F (36.6 C)     TempSrc: Axillary     SpO2: 95% 94% 94% 95%  Weight:      Height:        Intake/Output Summary (Last 24 hours) at 08/05/2018 1143 Last data filed at 08/05/2018 0856 Gross per 24 hour  Intake 3111.89 ml  Output 775 ml  Net 2336.89 ml   Filed Weights   08/04/18 0500 08/04/18 1011 08/05/18 0305  Weight: 92.2 kg 96.3 kg 97 kg    Exam:  General:  Appears critically ill, minimally responsive  Cardiovascular: S1, S2 present   Respiratory:  Bilateral coarse breath sounds  Abdomen: Soft, nontender, nondistended, bowel sounds present  Musculoskeletal: +1 generalized edema in BUE, 2+ pitting BLE  Skin:  Multiple ecchymosis noted  Psychiatry:  Unable to assess   Data Reviewed: CBC: Recent Labs  Lab 08/01/18 0217  08/01/18 2350 08/02/18 1040 08/02/18 1722 08/03/18 0508 08/03/18 1630 08/04/18 0307 08/05/18 0023 08/05/18 0210  WBC 14.2*  --  20.0* 29.1*  --  22.4*  --  10.6*  --  13.8*  NEUTROABS 3.8  --  11.4*  --   --  8.4*  --  5.9  --  5.8  HGB 9.6*  --  9.5* 9.0*  --  8.6*  --  8.0* 7.0* 7.0*  HCT 31.4*  --  31.9* 30.0*  --  29.3*  --  27.6* 23.4* 23.6*  MCV 87.2  --  90.1 89.3  --  89.3  --  90.2  --  89.7  PLT 36*   < > 30* 34* 40* 22* 18* 12*  --  16*   < > = values in this interval not displayed.   Basic Metabolic Panel: Recent Labs  Lab 07/31/18 0212  08/01/18 2350 08/02/18 1040 08/03/18 0508 08/04/18 0307 08/05/18 0210  NA 129*   < > 135 136 138 140 142  K 4.0   < > 4.7 5.1 4.8 3.7 4.1  CL 100   < >  106 107 114* 116* 115*  CO2 19*   < > 12* 13* 14* 14* 19*  GLUCOSE 89   < > 47* 87 211* 128* 168*  BUN 21   < > 44* 55* 66* 70* 80*  CREATININE 0.67   < > 1.26* 1.79* 1.96* 1.67* 1.93*  CALCIUM 7.9*   < > 7.3* 7.2* 6.8* 6.8* 6.9*  MG 2.1  --   --   --   --   --  2.4  PHOS 1.7*  --   --   --   --   --  3.0   < > = values in this interval not displayed.   GFR: Estimated Creatinine Clearance: 36.9 mL/min (A) (by C-G formula based on SCr of 1.93 mg/dL (H)). Liver Function Tests: Recent Labs  Lab 08/01/18 0217 08/01/18 2350 08/03/18 0508 08/04/18 0307 08/05/18 0210  AST 102* 133* 964* 482* 217*  ALT 32 32 186* 140* 82*  ALKPHOS 178* 163* 158* 127* 110  BILITOT 2.6* 2.8* 3.9* 5.6* 6.0*  PROT 4.8* 4.3* 4.2* 4.5* 4.9*  ALBUMIN 1.7* 1.6* 1.6* 1.4* 1.3*   No results for input(s): LIPASE, AMYLASE in the last 168 hours. Recent Labs  Lab 07/31/18 0646  AMMONIA 37*    Coagulation Profile: Recent Labs  Lab 07/29/2018 1709 08/01/18 1035 08/04/18 0858 08/05/18 0210  INR 1.5* 1.7* 2.0* 2.0*   Cardiac Enzymes: No results for input(s): CKTOTAL, CKMB, CKMBINDEX, TROPONINI in the last 168 hours. BNP (last 3 results) No results for input(s): PROBNP in the last 8760 hours. HbA1C: No results for input(s): HGBA1C in the last 72 hours. CBG: Recent Labs  Lab 08/04/18 1936 08/04/18 2349 08/05/18 0331 08/05/18 0726 08/05/18 1136  GLUCAP 128* 151* 170* 167* 151*   Lipid Profile: Recent Labs    08/05/18 0210  TRIG 174*   Thyroid Function Tests: No results for input(s): TSH, T4TOTAL, FREET4, T3FREE, THYROIDAB in the last 72 hours. Anemia Panel: No results for input(s): VITAMINB12, FOLATE, FERRITIN, TIBC, IRON, RETICCTPCT in the last 72 hours. Urine analysis:    Component Value Date/Time   COLORURINE AMBER (A) 08/05/2018 1003   APPEARANCEUR CLOUDY (A) 08/05/2018 1003   LABSPEC 1.015 08/05/2018 1003   PHURINE 5.0 08/05/2018 1003   GLUCOSEU 50 (A) 08/05/2018 1003   HGBUR MODERATE (A) 08/05/2018 1003   BILIRUBINUR SMALL (A) 08/05/2018 1003   BILIRUBINUR moderate (A) 07/25/2018 1645   BILIRUBINUR NEG 11/12/2017 1004   KETONESUR NEGATIVE 08/05/2018 1003   PROTEINUR 30 (A) 08/05/2018 1003   UROBILINOGEN 0.2 07/25/2018 1645   UROBILINOGEN 0.2 04/16/2014 1357   NITRITE NEGATIVE 08/05/2018 1003   LEUKOCYTESUR MODERATE (A) 08/05/2018 1003   Sepsis Labs: @LABRCNTIP (procalcitonin:4,lacticidven:4)  ) Recent Results (from the past 240 hour(s))  Urine culture     Status: Abnormal   Collection Time: 07/27/18 11:46 AM   Specimen: Urine, Random  Result Value Ref Range Status   Specimen Description   Final    URINE, RANDOM Performed at [facility], [place] [address], [place], [place] [address]    Special Requests   Final    NONE Performed at [facility], [place] [address], [place], [place] [address]    Culture  MULTIPLE SPECIES PRESENT, SUGGEST RECOLLECTION (A)  Final   Report Status 07/28/2018 FINAL  Final  SARS Coronavirus 2 (CEPHEID - Performed in [place] hospital lab), Hosp Order     Status: None   Collection Time: 07/27/18 11:47 AM   Specimen: Nasopharyngeal  Swab  Result Value Ref Range Status   SARS Coronavirus 2 NEGATIVE NEGATIVE Final    Comment: (NOTE) If result is NEGATIVE SARS-CoV-2 target nucleic acids are NOT DETECTED. The SARS-CoV-2 RNA is generally detectable in upper and lower  respiratory specimens during the acute phase of infection. The lowest  concentration of SARS-CoV-2 viral copies this assay can detect is 250  copies / mL. A negative result does not preclude SARS-CoV-2 infection  and should not be used as the sole basis for treatment or other  patient management decisions.  A negative result may occur with  improper specimen collection / handling, submission of specimen other  than nasopharyngeal swab, presence of viral mutation(s) within the  areas targeted by this assay, and inadequate number of viral copies  (<250 copies / mL). A negative result must be combined with clinical  observations, patient history, and epidemiological information. If result is POSITIVE SARS-CoV-2 target nucleic acids are DETECTED. The SARS-CoV-2 RNA is generally detectable in upper and lower  respiratory specimens dur ing the acute phase of infection.  Positive  results are indicative of active infection with SARS-CoV-2.  Clinical  correlation with patient history and other diagnostic information is  necessary to determine patient infection status.  Positive results do  not rule out bacterial infection or co-infection with other viruses. If result is PRESUMPTIVE POSTIVE SARS-CoV-2 nucleic acids MAY BE PRESENT.   A presumptive positive result was obtained on the submitted specimen  and confirmed on repeat testing.  While 2019 novel coronavirus  (SARS-CoV-2) nucleic acids may be present  in the submitted sample  additional confirmatory testing may be necessary for epidemiological  and / or clinical management purposes  to differentiate between  SARS-CoV-2 and other Sarbecovirus currently known to infect humans.  If clinically indicated additional testing with an alternate test  methodology [phone]) is advised. The SARS-CoV-2 RNA is generally  detectable in upper and lower respiratory sp ecimens during the acute  phase of infection. The expected result is Negative. Fact Sheet for Patients:  StrictlyIdeas.no Fact Sheet for Healthcare Providers: BankingDealers.co.za This test is not yet approved or cleared by the [place] FDA and has been authorized for detection and/or diagnosis of SARS-CoV-2 by FDA under an Emergency Use Authorization (EUA).  This EUA will remain in effect (meaning this test can be used) for the duration of the COVID-19 declaration under Section 564(b)(1) of the Act, 21 U.S.C. section 360bbb-3(b)(1), unless the authorization is terminated or revoked sooner. Performed at [facility], [place] [address] [place], [place] [address]   Culture, blood (routine x 2)     Status: None   Collection Time: 07/28/18  8:49 PM   Specimen: BLOOD  Result Value Ref Range Status   Specimen Description   Final    BLOOD BLOOD RIGHT HAND Performed at [place] [address], [place], [place] [address]    Special Requests   Final    BOTTLES DRAWN AEROBIC AND ANAEROBIC Blood Culture adequate volume Performed at [place] [address] [place], [place] [address]    Culture   Final    NO GROWTH 5 DAYS Performed at [facility], [place] [address] [place], [place] [address]    Report Status 08/02/2018 FINAL  Final  Culture, blood (routine x 2)     Status: None   Collection Time: 07/28/18  8:49 PM   Specimen: BLOOD RIGHT ARM  Result Value Ref Range  Status  Specimen Description   Final    BLOOD RIGHT ARM Performed at [facility], [place] [address], [place], [place] [address]    Special Requests   Final    BOTTLES DRAWN AEROBIC AND ANAEROBIC Blood Culture adequate volume Performed at [place] [address], [place], [place] [address]    Culture  Setup Time   Final    GRAM POSITIVE RODS ANAEROBIC BOTTLE ONLY CRITICAL RESULT CALLED TO, READ BACK BY AND VERIFIED WITH: RN D [place] [id number] [phone]    Culture   Final    GRAM POSITIVE RODS UNABLE TO FURTHER ID. CALL MICRO LAB IF ID IS NEEDED. Performed at [facility], [place] [address], [place], [place] [address]    Report Status 08/04/2018 FINAL  Final  Culture, blood (Routine x 2)     Status: Abnormal   Collection Time: 07/15/2018  5:09 PM   Specimen: BLOOD LEFT FOREARM  Result Value Ref Range Status   Specimen Description   Final    BLOOD LEFT FOREARM Performed at [place] [address], [place], [place] [address]    Special Requests   Final    BOTTLES DRAWN AEROBIC AND ANAEROBIC Blood Culture adequate volume Performed at [place] [address] [place], [place] [address]    Culture  Setup Time   Final    GRAM POSITIVE COCCI AEROBIC BOTTLE ONLY CRITICAL RESULT CALLED TO, READ BACK BY AND VERIFIED WITH: B GREEN PHARMD 07/31/18 0035 JDW    Culture (A)  Final    MICROCOCCUS LUTEUS/LYLAE THE SIGNIFICANCE OF ISOLATING THIS ORGANISM FROM A SINGLE SET OF BLOOD CULTURES WHEN MULTIPLE SETS ARE DRAWN IS UNCERTAIN. PLEASE NOTIFY THE MICROBIOLOGY DEPARTMENT WITHIN ONE WEEK IF SPECIATION AND SENSITIVITIES ARE REQUIRED. Performed at [facility], [place] [address], [place], [place] [address]    Report Status 08/02/2018 FINAL  Final  Blood Culture ID Panel (Reflexed)     Status: None   Collection Time: 07/21/2018  5:09 PM  Result Value Ref Range Status   Enterococcus species NOT DETECTED NOT DETECTED  Final   Listeria monocytogenes NOT DETECTED NOT DETECTED Final   Staphylococcus species NOT DETECTED NOT DETECTED Final   Staphylococcus aureus (BCID) NOT DETECTED NOT DETECTED Final   Streptococcus species NOT DETECTED NOT DETECTED Final   Streptococcus agalactiae NOT DETECTED NOT DETECTED Final   Streptococcus pneumoniae NOT DETECTED NOT DETECTED Final   Streptococcus pyogenes NOT DETECTED NOT DETECTED Final   Acinetobacter baumannii NOT DETECTED NOT DETECTED Final   Enterobacteriaceae species NOT DETECTED NOT DETECTED Final   Enterobacter cloacae complex NOT DETECTED NOT DETECTED Final   Escherichia coli NOT DETECTED NOT DETECTED Final   Klebsiella oxytoca NOT DETECTED NOT DETECTED Final   Klebsiella pneumoniae NOT DETECTED NOT DETECTED Final   Proteus species NOT DETECTED NOT DETECTED Final   Serratia marcescens NOT DETECTED NOT DETECTED Final   Haemophilus influenzae NOT DETECTED NOT DETECTED Final   Neisseria meningitidis NOT DETECTED NOT DETECTED Final   Pseudomonas aeruginosa NOT DETECTED NOT DETECTED Final   Candida albicans NOT DETECTED NOT DETECTED Final   Candida glabrata NOT DETECTED NOT DETECTED Final   Candida krusei NOT DETECTED NOT DETECTED Final   Candida parapsilosis NOT DETECTED NOT DETECTED Final   Candida tropicalis NOT DETECTED NOT DETECTED Final    Comment: Performed at [facility], [place] [address], [place], [place] [address]  Culture, blood (Routine x 2)  Status: None   Collection Time: 07/24/2018  5:11 PM   Specimen: BLOOD RIGHT HAND  Result Value Ref Range Status   Specimen Description   Final    BLOOD RIGHT HAND Performed at [place] [address] [place], [place] [address]    Special Requests   Final    BOTTLES DRAWN AEROBIC AND ANAEROBIC Blood Culture adequate volume Performed at [place] [address] [place], [place] [address]    Culture   Final    NO GROWTH 5 DAYS Performed at [facility], [place] [address] [place], [place] [address]    Report Status 08/04/2018 FINAL  Final  Novel Coronavirus,NAA,(SEND-OUT TO REF LAB - TAT 24-48 hrs); Hosp Order     Status: None   Collection Time: 07/07/2018  9:42 PM   Specimen: Nasopharyngeal Swab; Respiratory  Result Value Ref Range Status   SARS-CoV-2, NAA NOT DETECTED NOT DETECTED Final    Comment: (NOTE) This test was developed and its performance characteristics determined by [employer]. This test has not been FDA cleared or approved. This test has been authorized by FDA under an Emergency Use Authorization (EUA). This test is only authorized for the duration of time the declaration that circumstances exist justifying the authorization of the emergency use of in vitro diagnostic tests for detection of SARS-CoV-2 virus and/or diagnosis of COVID-19 infection under section 564(b)(1) of the Act, 21 U.S.C. 335KTG-2(B)(6), unless the authorization is terminated or revoked sooner. When diagnostic testing is negative, the possibility of a false negative result should be considered in the context of a patient's recent exposures and the presence of clinical signs and symptoms consistent with COVID-19. An individual without symptoms of COVID-19 and who is not shedding SARS-CoV-2 virus would expect to have a negative (not detected) result in this assay. Performed  At: [facility] [place], [place] [id number] Rush Farmer MD JG:[id number]    [place]  Final    Comment: Performed at [place] [address] [place], [place] [address]  MRSA PCR Screening     Status: None   Collection Time: 07/29/2018 11:20 PM   Specimen: Nasal Mucosa; Nasopharyngeal  Result Value Ref Range Status   MRSA by PCR NEGATIVE NEGATIVE Final    Comment:        The GeneXpert MRSA Assay (FDA approved for NASAL specimens only), is one component of a comprehensive MRSA  colonization surveillance program. It is not intended to diagnose MRSA infection nor to guide or monitor treatment for MRSA infections. Performed at [facility], [place] [address] [place], [place] [address]   Culture, blood (routine x 2)     Status: None (Preliminary result)   Collection Time: 08/01/18  6:03 PM   Specimen: BLOOD  Result Value Ref Range Status   Specimen Description   Final    BLOOD SITE NOT SPECIFIED Performed at [place] [address], [place], [place] [address]    Special Requests   Final    BOTTLES DRAWN AEROBIC AND ANAEROBIC Blood Culture adequate volume Performed at [place] [address], [place], [place] [address]    Culture   Final    NO GROWTH 4 DAYS Performed at [facility], [place] [address], [place],  [address]    Report Status PENDING  Incomplete  Culture, blood (routine x 2)     Status: Abnormal   Collection  Time: 08/01/18  6:49 PM   Specimen: BLOOD  Result Value Ref Range Status   Specimen Description   Final    BLOOD RIGHT ANTECUBITAL Performed at [place] [address] [place], [place] [address]    Special Requests   Final    BOTTLES DRAWN AEROBIC ONLY Blood Culture adequate volume Performed at [place] [address] [place], [place] [address]    Culture  Setup Time   Final    GRAM POSITIVE COCCI IN CLUSTERS AEROBIC BOTTLE ONLY Organism ID to follow CRITICAL RESULT CALLED TO, READ BACK BY AND VERIFIED WITH: PHARMD WALFORD, D 1743 [id number] FCP    Culture (A)  Final    STAPHYLOCOCCUS SPECIES (COAGULASE NEGATIVE) THE SIGNIFICANCE OF ISOLATING THIS ORGANISM FROM A SINGLE SET OF BLOOD CULTURES WHEN MULTIPLE SETS ARE DRAWN IS UNCERTAIN. PLEASE NOTIFY THE MICROBIOLOGY DEPARTMENT WITHIN ONE WEEK IF SPECIATION AND SENSITIVITIES ARE REQUIRED. Performed at [facility], [place] [address], [place], [place] [address]    Report  Status 08/03/2018 FINAL  Final  Blood Culture ID Panel (Reflexed)     Status: Abnormal   Collection Time: 08/01/18  6:49 PM  Result Value Ref Range Status   Enterococcus species NOT DETECTED NOT DETECTED Final   Listeria monocytogenes NOT DETECTED NOT DETECTED Final   Staphylococcus species DETECTED (A) NOT DETECTED Final    Comment: Methicillin (oxacillin) resistant coagulase negative staphylococcus. Possible blood culture contaminant (unless isolated from more than one blood culture draw or clinical case suggests pathogenicity). No antibiotic treatment is indicated for blood  culture contaminants. CRITICAL RESULT CALLED TO, READ BACK BY AND VERIFIED WITH: PHARMD WALFORD, D 1743 [id number] FCP    Staphylococcus aureus (BCID) NOT DETECTED NOT DETECTED Final   Methicillin resistance DETECTED (A) NOT DETECTED Final    Comment: CRITICAL RESULT CALLED TO, READ BACK BY AND VERIFIED WITH: PHARMD WALFORD, D 1743 [id number] FCP    Streptococcus species NOT DETECTED NOT DETECTED Final   Streptococcus agalactiae NOT DETECTED NOT DETECTED Final   Streptococcus pneumoniae NOT DETECTED NOT DETECTED Final   Streptococcus pyogenes NOT DETECTED NOT DETECTED Final   Acinetobacter baumannii NOT DETECTED NOT DETECTED Final   Enterobacteriaceae species NOT DETECTED NOT DETECTED Final   Enterobacter cloacae complex NOT DETECTED NOT DETECTED Final   Escherichia coli NOT DETECTED NOT DETECTED Final   Klebsiella oxytoca NOT DETECTED NOT DETECTED Final   Klebsiella pneumoniae NOT DETECTED NOT DETECTED Final   Proteus species NOT DETECTED NOT DETECTED Final   Serratia marcescens NOT DETECTED NOT DETECTED Final   Haemophilus influenzae NOT DETECTED NOT DETECTED Final   Neisseria meningitidis NOT DETECTED NOT DETECTED Final   Pseudomonas aeruginosa NOT DETECTED NOT DETECTED Final   Candida albicans NOT DETECTED NOT DETECTED Final   Candida glabrata NOT DETECTED NOT DETECTED Final   Candida krusei NOT DETECTED NOT  DETECTED Final   Candida parapsilosis NOT DETECTED NOT DETECTED Final   Candida tropicalis NOT DETECTED NOT DETECTED Final    Comment: Performed at [facility], [place]. [address] [place], [place] [address]  MRSA PCR Screening     Status: None   Collection Time: 08/03/18 11:45 PM   Specimen: Nasal Mucosa; Nasopharyngeal  Result Value Ref Range Status   MRSA by PCR NEGATIVE NEGATIVE Final    Comment:        The GeneXpert MRSA Assay (FDA approved for NASAL specimens only), is one component of a comprehensive MRSA colonization surveillance program.  It is not intended to diagnose MRSA infection nor to guide or monitor treatment for MRSA infections. Performed at [facility], [place] [address], [place], [place] [address]       Studies: Dg Chest Port 1 View  Result Date: 08/05/2018 CLINICAL DATA:  Respiratory failure.  Hypoxia. EXAM: PORTABLE CHEST 1 VIEW COMPARISON:  08/04/2018.  Six [address] [place]-seven 2020 FINDINGS: Left subclavian line in stable position. Cardiomegaly with mild pulmonary venous congestion. Mild bilateral interstitial prominence. Mild left base infiltrate remains. Tiny left pleural effusion cannot be excluded. No pneumothorax. Degenerative change thoracic spine. IMPRESSION: 1.  Left subclavian line stable position. 2. Cardiomegaly with mild pulmonary venous congestion and mild bilateral interstitial prominence. Tiny left pleural effusion cannot be excluded. Mild CHF cannot be excluded. 3.  Mild left base infiltrate remains.  No change. Electronically Signed   By: [place] Riegel   On: 08/05/2018 05:47    Scheduled Meds: . sodium chloride   Intravenous Once  . [email or web address]   Topical BID  . Chlorhexidine Gluconate Cloth  6 each Topical Daily  . Chlorhexidine Gluconate Cloth  6 each Topical Daily  . hydrocortisone sod succinate (SOLU-CORTEF) inj  50 mg Intravenous Q6H  . Immune Globulin 10%  400 mg/kg Intravenous Q24 Hr x 5  . insulin aspart   0-9 Units Subcutaneous Q4H  . mouth rinse  15 mL Mouth Rinse BID  . sodium chloride flush  10-40 mL Intracatheter Q12H    Continuous Infusions: . heparin 950 Units/hr (08/05/18 0856)  .  sodium bicarbonate  infusion 1000 mL    .  sodium bicarbonate  infusion 1000 mL 85 mL/hr at 08/05/18 1032  . TPN ADULT (ION) 40 mL/hr at 08/05/18 0856  . TPN ADULT (ION)       LOS: 6 days     Alma Friendly, MD [facility]  If 7PM-7AM, please contact night-coverage [email or web address] 08/05/2018, 11:43 AM

## 2018-08-05 NOTE — Progress Notes (Addendum)
HEMATOLOGY-ONCOLOGY PROGRESS NOTE  SUBJECTIVE: Remains confused. Opens eyes when I speak with him. Unable to answer questions. Continues on IVIG. Received 2 units of platelets yesterday. Has not had much improvement in platelet count despite this. Nursing has not noticed any active bleeding.  He does have multiple ecchymotic areas on his arms and legs.    OBJECTIVE: REVIEW OF SYSTEMS:   Not obtained because of confusion  PHYSICAL EXAMINATION: ECOG PERFORMANCE STATUS: 4 - Bedbound  Vitals:   08/05/18 1100 08/05/18 1200  BP: 125/65   Pulse: (!) 109   Resp: 20   Temp:  98.1 F (36.7 C)  SpO2: 95%    Filed Weights   08/04/18 0500 08/04/18 1011 08/05/18 0305  Weight: 203 lb 4.2 oz (92.2 kg) 212 lb 4.9 oz (96.3 kg) 213 lb 13.5 oz (97 kg)   Exam:  GENERAL: Sleeping, opens eyes but does not answer questions EYES: Scleral icterus notes.  LUNGS:  Coarse BS HEART: Tachycardia ABDOMEN:abdomen soft, non-tender and normal bowel sounds Musculoskeletal:no cyanosis of digits and no clubbing  NEURO: Moving all extremities.  Not oriented Extremities: Generalized edema Skin: Multiple ecchymotic areas over his arms and legs.  LABORATORY DATA:  I have reviewed the data as listed CMP Latest Ref Rng & Units 08/05/2018 08/04/2018 08/03/2018  Glucose 70 - 99 mg/dL 168(H) 128(H) 211(H)  BUN 8 - 23 mg/dL 80(H) 70(H) 66(H)  Creatinine 0.61 - 1.24 mg/dL 1.93(H) 1.67(H) 1.96(H)  Sodium 135 - 145 mmol/L 142 140 138  Potassium 3.5 - 5.1 mmol/L 4.1 3.7 4.8  Chloride 98 - 111 mmol/L 115(H) 116(H) 114(H)  CO2 22 - 32 mmol/L 19(L) 14(L) 14(L)  Calcium 8.9 - 10.3 mg/dL 6.9(L) 6.8(L) 6.8(L)  Total Protein 6.5 - 8.1 g/dL 4.9(L) 4.5(L) 4.2(L)  Total Bilirubin 0.3 - 1.2 mg/dL 6.0(H) 5.6(H) 3.9(H)  Alkaline Phos 38 - 126 U/L 110 127(H) 158(H)  AST 15 - 41 U/L 217(H) 482(H) 964(H)  ALT 0 - 44 U/L 82(H) 140(H) 186(H)    Lab Results  Component Value Date   WBC 13.8 (H) 08/05/2018   HGB 7.0 (L) 08/05/2018   HCT 23.6 (L) 08/05/2018   MCV 89.7 08/05/2018   PLT 16 (LL) 08/05/2018   NEUTROABS 5.8 08/05/2018    ASSESSMENT AND PLAN: 1. Worsening thrombocytopenia: HIT panel negative. ADAMTS13 has been ordered and is pending. Will give IVIG and platelets today.  2.  Elevated LFTS: suspected to be due to hypo-perfusion of liver.  LFTs have improved but total bilirubin is rising. LDH trending downward.  3. Anemia and Leucocytosis: due to acute illness; Transfuse PRBC for Hemoglobin < 7.0 or active bleeding.  4. DVT and PE: On IV heparin. Recommend continuation of heparin despite low platelet count due to risk of infarct.   Mikey Bussing, DNP, AGPCNP-BC, AOCNP  Attending Note  I personally saw the patient, reviewed the chart and examined the patient.  1.  Persistent thrombocytopenia and worsening anemia I believe he has pan bone marrow suppression as well as increased consumption of platelets. Unfortunately supportive care is all that we are able to offer for him. We will continue to support the platelets and transfuse packed red cells for anemia. His prognosis is extremely poor and we need to consider hospice/palliative care options. 2.  DVT and PE on IV heparin No evidence of schistocytes on the smear, no evidence of TTP.  Patient does have DIC picture for which anticoagulation is necessary. 3.  Elevation of liver function tests and renal failure due  to hypotension

## 2018-08-05 NOTE — Telephone Encounter (Signed)
[facility] to come out to home and pick up Durable equipment. They will call wife and schedule pick up time.

## 2018-08-05 NOTE — Progress Notes (Signed)
Nutrition Follow-up  RD working remotely.   DOCUMENTATION CODES:   (unable to assess for malnutrition at this time.)  INTERVENTION:  - TPN per Pharmacy. - once it becomes feasible, recommend inserting small bore NGT as there has been no indication of GI dysfunction/inability to tolerate enteral nutrition.    NUTRITION DIAGNOSIS:   Increased nutrient needs related to acute illness, other (see comment)(multiple hospitalizations in 2 weeks) as evidenced by estimated needs. -ongoing  GOAL:   Patient will meet greater than or equal to 90% of their needs -to be met with TPN regimen  MONITOR:   Diet advancement, Labs, Weight trends, Other (Comment)(TPN regimen)  REASON FOR ASSESSMENT:   Consult New TPN/TNA  ASSESSMENT:   78 year-old male with medical history significant for recently diagnosed pulmonary embolus and BLE DVTs on Xarelto, HTN, hyperlipidemia, depression, and hard of hearing. He presented to the ED for evaluation of generalized weakness, confusion, abdominal distention. Patient was recently admitted from 6/12-6/16 for a L segmental pulmonary embolus and BLE DVTs. He was discharged on home supplemental oxygen due to persistent hypoxia while admitted. He was re-admitted 6/21-6/23 for generalized weakness/confusion and lethargy. Repeat CTA chest showed persistent known PE. He returned to the ED on 6/24 per PCP recommendation.  Weight +14.9 kg/33 lb since admission (6/24). Suspect that this is d/t fluid and/or difference in scales or items on bed during weighing as patient has been NPO since admission.   Patient seen by this RD on 6/25; spoke with patient's wife via phone that date. A new consult was placed and follow-up done by another RD on 6/28. Consult received from yesterday AM for new TPN.   Patient has triple lumen L subclavian CVC and custom TPN @ 40 ml/hr started 6/29 PM and per Pharmacy note this AM, plan to increase custom TPN to 90 ml/hr tonight. This regimen is  goal rate and will provide 78 kcal and 119 grams of protein which will meet 100% estimated nutrition needs.   Notes indicate that GI started TPN because of concern of bleeding if NGT inserted.  Per notes: AMS (flow sheet indicates patient is a/o to self only), fever, shock with goal MAP >65, suspected shock liver, severe thrombocytopenia, hyperammoniemia, elevated LFTs, AKI, metabolic acidosis, acute hypoxic respiratory failure 2/2 PE with concern for LLL infarction.  Plan for Palliative Care consult per wife's request to staff yesterday.    Medications reviewed; sliding scale novolog. Labs reviewed; CBGs: 170 and 167 mg/dl today, Cl: 115 mmol/l, BUN: 80 mg/dl, creatinine: 1.93 mg/dl, Ca: 6.9 mg/dl, LFTs elevated, GFR: 32 ml/min, triglycerides: 174 mg/dl today.  IVF; D5-50 mEq sodium bicarb-1/2 NS @ 85 ml/hr (347 kcal).     NUTRITION - FOCUSED PHYSICAL EXAM:  unable to complete at this time.   Diet Order:   Diet Order            Diet NPO time specified Except for: Sips with Meds  Diet effective now              EDUCATION NEEDS:   Not appropriate for education at this time  Skin:  Skin Assessment: Reviewed RN Assessment  Last BM:  6/30  Height:   Ht Readings from Last 1 Encounters:  08/02/2018 _0  (1.778 m)    Weight:   Wt Readings from Last 1 Encounters:  08/05/18 97 kg    Ideal Body Weight:  75.4 kg  BMI:  Body mass index is 30.68 kg/m.  Estimated Nutritional Needs:   Kcal:  7681-1572 kcal  Protein:  102-120 grams  Fluid:  >/= 2.1 L/day      Jarome Matin, MS, RD, LDN, [facility] Inpatient Clinical Dietitian Pager # [phone] After hours/weekend pager # [phone]

## 2018-08-05 NOTE — Progress Notes (Signed)
OT Cancellation Note  Patient Details Name: Luis Cisneros MRN: [id number] DOB: [birth year]   Cancelled Treatment:    Reason Eval/Treat Not Completed: Medical issues which prohibited therapy.  Pt not awake, not following commands and gasping for breath. Will check back another day to see if pt improves or if we should sign off.  [place]air 08/05/2018, 11:03 AM  Lesle Chris, OTR/L Acute Rehabilitation Services [phone] [facility] pager [phone] office 08/05/2018

## 2018-08-06 ENCOUNTER — Inpatient Hospital Stay (HOSPITAL_COMMUNITY): Payer: Medicare Other

## 2018-08-06 LAB — GLUCOSE, CAPILLARY
Glucose-Capillary: 163 mg/dL — ABNORMAL HIGH (ref 70–99)
Glucose-Capillary: 164 mg/dL — ABNORMAL HIGH (ref 70–99)
Glucose-Capillary: 167 mg/dL — ABNORMAL HIGH (ref 70–99)
Glucose-Capillary: 178 mg/dL — ABNORMAL HIGH (ref 70–99)
Glucose-Capillary: 184 mg/dL — ABNORMAL HIGH (ref 70–99)
Glucose-Capillary: 198 mg/dL — ABNORMAL HIGH (ref 70–99)

## 2018-08-06 LAB — IGG: IgG (Immunoglobin G), Serum: 1979 mg/dL — ABNORMAL HIGH (ref 603–1613)

## 2018-08-06 LAB — BLOOD GAS, VENOUS
Acid-base deficit: 7 mmol/L — ABNORMAL HIGH (ref 0.0–2.0)
Acid-base deficit: 7.8 mmol/L — ABNORMAL HIGH (ref 0.0–2.0)
Bicarbonate: 19.8 mmol/L — ABNORMAL LOW (ref 20.0–28.0)
Bicarbonate: 18.9 mmol/L — ABNORMAL LOW (ref 20.0–28.0)
O2 Content: 3 L/min
O2 Content: 6 L/min
O2 Saturation: 74.2 %
O2 Saturation: 73.3 %
Patient temperature: 98.6
Patient temperature: 98.6
pCO2, Ven: 50.1 mmHg (ref 44.0–60.0)
pCO2, Ven: 48.3 mmHg (ref 44.0–60.0)
pH, Ven: 7.221 — ABNORMAL LOW (ref 7.250–7.430)
pH, Ven: 7.217 — ABNORMAL LOW (ref 7.250–7.430)
pO2, Ven: 47.8 mmHg — ABNORMAL HIGH (ref 32.0–45.0)
pO2, Ven: 46.1 mmHg — ABNORMAL HIGH (ref 32.0–45.0)

## 2018-08-06 LAB — BPAM PLATELET PHERESIS
Blood Product Expiration Date: 202007012359
Blood Product Expiration Date: 202007022359
ISSUE DATE / TIME: 202006291959
ISSUE DATE / TIME: 202006301223
Unit Type and Rh: 5100
Unit Type and Rh: 6200

## 2018-08-06 LAB — COMPREHENSIVE METABOLIC PANEL
ALT: 47 U/L — ABNORMAL HIGH (ref 0–44)
AST: 131 U/L — ABNORMAL HIGH (ref 15–41)
Albumin: 1.2 g/dL — ABNORMAL LOW (ref 3.5–5.0)
Alkaline Phosphatase: 103 U/L (ref 38–126)
Anion gap: 6 (ref 5–15)
BUN: 114 mg/dL — ABNORMAL HIGH (ref 8–23)
CO2: 21 mmol/L — ABNORMAL LOW (ref 22–32)
Calcium: 7.4 mg/dL — ABNORMAL LOW (ref 8.9–10.3)
Chloride: 114 mmol/L — ABNORMAL HIGH (ref 98–111)
Creatinine, Ser: 2.35 mg/dL — ABNORMAL HIGH (ref 0.61–1.24)
GFR calc Af Amer: 30 mL/min — ABNORMAL LOW (ref >60)
GFR calc non Af Amer: 26 mL/min — ABNORMAL LOW (ref >60)
Glucose, Bld: 204 mg/dL — ABNORMAL HIGH (ref 70–99)
Potassium: 4.3 mmol/L (ref 3.5–5.1)
Sodium: 141 mmol/L (ref 135–145)
Total Bilirubin: 6.3 mg/dL — ABNORMAL HIGH (ref 0.3–1.2)
Total Protein: 4.8 g/dL — ABNORMAL LOW (ref 6.5–8.1)

## 2018-08-06 LAB — PREPARE PLATELET PHERESIS
Unit division: 0
Unit division: 0

## 2018-08-06 LAB — BLOOD GAS, ARTERIAL
Acid-base deficit: 11.7 mmol/L — ABNORMAL HIGH (ref 0.0–2.0)
Bicarbonate: 17.5 mmol/L — ABNORMAL LOW (ref 20.0–28.0)
Drawn by: 225631
O2 Content: 8 L/min
O2 Saturation: 86.6 %
Patient temperature: 97.5
RATE: 18 resp/min
pCO2 arterial: 59.5 mmHg — ABNORMAL HIGH (ref 32.0–48.0)
pH, Arterial: 7.091 — CL (ref 7.350–7.450)
pO2, Arterial: 64.6 mmHg — ABNORMAL LOW (ref 83.0–108.0)

## 2018-08-06 LAB — CBC
HCT: 25.6 % — ABNORMAL LOW (ref 39.0–52.0)
Hemoglobin: 7.9 g/dL — ABNORMAL LOW (ref 13.0–17.0)
MCH: 28.1 pg (ref 26.0–34.0)
MCHC: 30.9 g/dL (ref 30.0–36.0)
MCV: 91.1 fL (ref 80.0–100.0)
Platelets: 21 10*3/uL — CL (ref 150–400)
RBC: 2.81 MIL/uL — ABNORMAL LOW (ref 4.22–5.81)
RDW: 17.5 % — ABNORMAL HIGH (ref 11.5–15.5)
WBC: 17.6 10*3/uL — ABNORMAL HIGH (ref 4.0–10.5)
nRBC: 17.4 % — ABNORMAL HIGH (ref 0.0–0.2)

## 2018-08-06 LAB — RENAL FUNCTION PANEL
Albumin: 1.1 g/dL — ABNORMAL LOW (ref 3.5–5.0)
Anion gap: 8 (ref 5–15)
BUN: 111 mg/dL — ABNORMAL HIGH (ref 8–23)
CO2: 19 mmol/L — ABNORMAL LOW (ref 22–32)
Calcium: 7.6 mg/dL — ABNORMAL LOW (ref 8.9–10.3)
Chloride: 114 mmol/L — ABNORMAL HIGH (ref 98–111)
Creatinine, Ser: 2.71 mg/dL — ABNORMAL HIGH (ref 0.61–1.24)
GFR calc Af Amer: 25 mL/min — ABNORMAL LOW (ref >60)
GFR calc non Af Amer: 22 mL/min — ABNORMAL LOW (ref >60)
Glucose, Bld: 194 mg/dL — ABNORMAL HIGH (ref 70–99)
Phosphorus: 4.4 mg/dL (ref 2.5–4.6)
Potassium: 4.9 mmol/L (ref 3.5–5.1)
Sodium: 141 mmol/L (ref 135–145)

## 2018-08-06 LAB — CULTURE, BLOOD (ROUTINE X 2)
Culture: NO GROWTH
Special Requests: ADEQUATE

## 2018-08-06 LAB — MAGNESIUM: Magnesium: 2.3 mg/dL (ref 1.7–2.4)

## 2018-08-06 LAB — GLOMERULAR BASEMENT MEMBRANE ANTIBODIES: GBM Ab: 3 units (ref 0–20)

## 2018-08-06 LAB — HEPARIN LEVEL (UNFRACTIONATED)
Heparin Unfractionated: 0.31 IU/mL (ref 0.30–0.70)
Heparin Unfractionated: 0.3 IU/mL (ref 0.30–0.70)

## 2018-08-06 LAB — LACTIC ACID, PLASMA: Lactic Acid, Venous: 3.9 mmol/L (ref 0.5–1.9)

## 2018-08-06 LAB — PHOSPHORUS: Phosphorus: 3.9 mg/dL (ref 2.5–4.6)

## 2018-08-06 LAB — CYCLIC CITRUL PEPTIDE ANTIBODY, IGG/IGA: CCP Antibodies IgG/IgA: 20 units — ABNORMAL HIGH (ref 0–19)

## 2018-08-06 MED ORDER — NOREPINEPHRINE 4 MG/250ML-% IV SOLN
0.0000 ug/min | INTRAVENOUS | Status: DC
  Administered 2018-08-06: 2 ug/min via INTRAVENOUS
  Filled 2018-08-06: qty 250

## 2018-08-06 MED ORDER — SODIUM CHLORIDE 0.9 % FOR CRRT
INTRAVENOUS_CENTRAL | Status: DC | PRN
  Filled 2018-08-06: qty 1000

## 2018-08-06 MED ORDER — PHENYLEPHRINE HCL-NACL 10-0.9 MG/250ML-% IV SOLN
INTRAVENOUS | Status: AC
  Filled 2018-08-06: qty 250

## 2018-08-06 MED ORDER — PRISMASOL BGK 4/2.5 32-4-2.5 MEQ/L REPLACEMENT SOLN
Status: DC
  Filled 2018-08-06 (×7): qty 5000

## 2018-08-06 MED ORDER — PRISMASOL BGK 4/2.5 32-4-2.5 MEQ/L REPLACEMENT SOLN
Status: DC
  Filled 2018-08-06 (×5): qty 5000

## 2018-08-06 MED ORDER — HEPARIN SODIUM (PORCINE) 1000 UNIT/ML DIALYSIS
1000.0000 [IU] | INTRAMUSCULAR | Status: DC | PRN
  Filled 2018-08-06: qty 6

## 2018-08-06 MED ORDER — TRAVASOL 10 % IV SOLN
INTRAVENOUS | Status: DC
  Administered 2018-08-06: 18:00:00 via INTRAVENOUS
  Filled 2018-08-06: qty 1188

## 2018-08-06 MED ORDER — PHENYLEPHRINE HCL-NACL 10-0.9 MG/250ML-% IV SOLN
0.0000 ug/min | INTRAVENOUS | Status: DC
  Administered 2018-08-06: 400 ug/min via INTRAVENOUS
  Administered 2018-08-06: 20 ug/min via INTRAVENOUS
  Administered 2018-08-07: 50 ug/min via INTRAVENOUS
  Filled 2018-08-06: qty 500
  Filled 2018-08-06: qty 250

## 2018-08-06 MED ORDER — VASOPRESSIN 20 UNIT/ML IV SOLN
0.0300 [IU]/min | INTRAVENOUS | Status: DC
  Administered 2018-08-06: 0.03 [IU]/min via INTRAVENOUS
  Filled 2018-08-06 (×3): qty 2

## 2018-08-06 MED ORDER — SODIUM CHLORIDE 0.9% IV SOLUTION
Freq: Once | INTRAVENOUS | Status: AC
  Administered 2018-08-06: 11:00:00 via INTRAVENOUS

## 2018-08-06 MED ORDER — CHLORHEXIDINE GLUCONATE CLOTH 2 % EX PADS
6.0000 | MEDICATED_PAD | Freq: Every day | CUTANEOUS | Status: DC
  Administered 2018-08-06: 22:00:00 6 via TOPICAL

## 2018-08-06 MED ORDER — SODIUM BICARBONATE 8.4 % IV SOLN
50.0000 meq | Freq: Once | INTRAVENOUS | Status: AC
  Administered 2018-08-06: 50 meq via INTRAVENOUS
  Filled 2018-08-06: qty 50

## 2018-08-06 MED ORDER — MIDAZOLAM HCL 2 MG/2ML IJ SOLN
INTRAMUSCULAR | Status: AC
  Administered 2018-08-06: 2 mg
  Filled 2018-08-06: qty 2

## 2018-08-06 MED ORDER — FENTANYL CITRATE (PF) 100 MCG/2ML IJ SOLN
INTRAMUSCULAR | Status: AC
  Administered 2018-08-06: 100 ug
  Filled 2018-08-06: qty 2

## 2018-08-06 MED ORDER — AMIODARONE HCL IN DEXTROSE 360-4.14 MG/200ML-% IV SOLN
INTRAVENOUS | Status: AC
  Administered 2018-08-06
  Filled 2018-08-06: qty 200

## 2018-08-06 MED ORDER — AMIODARONE IV BOLUS ONLY 150 MG/100ML
INTRAVENOUS | Status: AC
  Administered 2018-08-06: 23:00:00
  Filled 2018-08-06: qty 100

## 2018-08-06 MED ORDER — PRISMASOL BGK 4/2.5 32-4-2.5 MEQ/L IV SOLN
INTRAVENOUS | Status: DC
  Filled 2018-08-06 (×11): qty 5000

## 2018-08-06 MED ORDER — HEPARIN (PORCINE) 25000 UT/250ML-% IV SOLN
1100.0000 [IU]/h | INTRAVENOUS | Status: DC
  Administered 2018-08-06 (×2): 1100 [IU]/h via INTRAVENOUS
  Filled 2018-08-06: qty 250

## 2018-08-06 MED ORDER — ALBUMIN HUMAN 25 % IV SOLN
INTRAVENOUS | Status: AC
  Filled 2018-08-06: qty 100

## 2018-08-06 MED ORDER — ALBUMIN HUMAN 25 % IV SOLN
50.0000 g | Freq: Once | INTRAVENOUS | Status: AC
  Administered 2018-08-06: 50 g via INTRAVENOUS

## 2018-08-06 NOTE — Progress Notes (Signed)
ANTICOAGULATION CONSULT NOTE - Follow Up Consult  Pharmacy Consult for Heparin Indication: pulmonary embolus  Allergies  Allergen Reactions  . Cephalexin Nausea Only  . Feldene [Piroxicam] Hives, Nausea Only and Other (See Comments)    Makes pt feel loopy    Patient Measurements: Height: 5\' 10"  (177.8 cm) Weight: 213 lb 13.5 oz (97 kg) IBW/kg (Calculated) : 73 Heparin Dosing Weight:   Vital Signs: Temp: 97.6 F (36.4 C) (07/01 0400) Temp Source: Axillary (07/01 0400) BP: 119/49 (07/01 0400) Pulse Rate: 102 (07/01 0400)  Labs: Recent Labs    08/03/18 0508  08/04/18 0307 08/04/18 0858 08/05/18 0023 08/05/18 0210 08/05/18 0533 08/05/18 1415 08/05/18 1510 08/05/18 2345  HGB 8.6*  --  8.0*  --  7.0* 7.0*  --   --  6.8*  --   HCT 29.3*  --  27.6*  --  23.4* 23.6*  --   --  22.2*  --   PLT 22*   < > 12*  --   --  16*  --   --  22*  --   LABPROT  --   --   --  22.4*  --  22.6*  --   --   --   --   INR  --   --   --  2.0*  --  2.0*  --   --   --   --   HEPARINUNFRC  --   --   --   --   --   --  0.33 0.26*  --  0.31  CREATININE 1.96*  --  1.67*  --   --  1.93*  --   --   --   --    < > = values in this interval not displayed.    Estimated Creatinine Clearance: 36.9 mL/min (A) (by C-G formula based on SCr of 1.93 mg/dL (H)).   Medications:  Infusions:  . heparin 1,050 Units/hr (08/06/18 0300)  .  sodium bicarbonate  infusion 1000 mL 35 mL/hr at 08/05/18 1735  . TPN ADULT (ION) 90 mL/hr at 08/06/18 0300    Assessment: Patient with heparin level at goal.  No heparin issues noted.  Goal of Therapy:  Heparin level 0.3-0.7 units/ml Monitor platelets by anticoagulation protocol: Yes   Plan:  Continue heparin drip at current rate Recheck level at 0800  Tyler Deis, Shea Stakes [place] 08/06/2018,4:50 AM

## 2018-08-06 NOTE — Significant Event (Addendum)
Called to bedside for evaluation of AMS and hypoxic respiratory failure:   HPI:   Admitted 6/21 for lethargy x 1 week TA, poor po intake, confusion, low UOP, fever.  PMH: PE (2006, 2020),  HLD, HTN, Arthritis, depression, former smoker quit 31 y ago. Thrombocytopenia in 6/20, elevated LFTs.  Admitted 6/12-6/16 for PE - RV strain, dc on xarelto. D/c with O2 for home.   ROS: unable to assess  S: patient unresponsive O:  Vitals:   08/06/18 1600 08-20-18 0000  BP: (!) 169/71 114/64  Pulse: (!) 103 (!) 137  Resp: 20 (!) 22  Temp:  98.2 F (36.8 C)  SpO2: 92% 94%  Physical Exam  Constitutional:  Non responsive, opens eyes minimally to sternal rub.  Tachypneic  HENT:  Head: Normocephalic.  Eyes:   scleral edema  Neck: No JVD present. No tracheal deviation present.  Cervical kyphosis  Cardiovascular: Intact distal pulses.  Irregular rhythm, narrow complex, tachycardi=c  Pulmonary/Chest: He is in respiratory distress. He has no wheezes.  Increased effort  Abdominal: He exhibits no distension. There is no abdominal tenderness. There is no rebound and no guarding.  Genitourinary:    Genitourinary Comments: Scrotal edema   Musculoskeletal:        General: Edema present.     Comments: 3+ pitting edema in legs and 2+ in abdominal  Diffuse non pitting edema in BUE  Lymphadenopathy:    He has no cervical adenopathy.  Neurological:  Minimal eye opening to sternal rub  Skin: Skin is warm and dry. Rash noted.  Petechiae diffusely (neck and chest, UE    A/P:    AMS:  Seems consistent with metabolic encephalopathy, multifactorial from uremia, acidosis, hypercarbia.  GI doubts hepatic enceph.  Only minimally arousable to sternal rub. Has been worsening during course of admission. More profound in past several days.   Hypoxic hypercarbic resp failure: 88 on Lutz (HFNC??), Satting 100% after NRB placed.  Tachypneic, rhonchi Intubated, large A-a gradient, satting low 90s on 100% fio2.   ET  tube migrated out with movement, pushed back in to 25 at teeth and problem resolved.    Intermittent afib:  Given amio bolus prior to intubation. Not controlled (140s-160s with amio gtt).  Considered Electric cardioversion but trial of amio first. Avoiding given potential for thrombotic complications (ongoing hypercoagulability)  UTI - enterrococus from 6/21,  Restarting vanc.   Hypotension started 6/27, lactic acidosis, increasing WBC Started neo 6/27.  Increased pressor requirements tonight.  Decreased the neosynephrine, adding levophed given pulmonary vascular complications, cont vaso.  Art line showing very labile bp.    Unable to view subxyphoid view to assess IVC.  Apical 4 chamber, RV minimally dilated but no overt RV dysfunction.  Lactic acidosis 3.9  AKI: Worsening Cr 6/27.  Uremia.  Oliguric.    PE: pulm infarct L   Per cards 6/13 echo  no RV strain, RV enlarged.  Put on lovenox. Changed to heparin.  Have to hold heparin now given acute GIB  GIB: bright red blood in mouth (possible from oral bleeding vs GI/emesis).   Started protonix, held heparin.   pulm nodules:" noted on CT, small.    Anasarca, hypoalb  Thrombocytopenia: ddx consumptive, spenomegaly No schistocytes IVIG on 6/28 Plt 21  Anemia/hemolysis?   Transaminitis: since prior admit 6/20. GI - congestive hepatopathy   Splenic infarcts and splenomegaly   Family called, allowed to come to bedside.   Discussed current state of worsening multiorgan failure.  Wife Mimi,  daughter and granddaughter present.   DNR, no dialysis.  Plan to transition to comfort.   Will remain intubated for his comfort, versed and morphine prn, appears comfortable after 1mg  versed.  Anticipate cardiac death minutes to hours after pressors stopped (on 10levophed, 56mcg phenylephrine and 0.03 vasopressin).     critical care time 75 min.

## 2018-08-06 NOTE — Progress Notes (Addendum)
[facility] ROUNDING NOTE   Subjective:   This very nice 78 year old gentleman was admitted 07/11/2018 with shortness of breath and fatigue had a prior history of pulmonary embolus in 2006.  In the past 6 months has been admitted 3 times with weakness confusion fever and failure to thrive.  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  2D echo at that time was negative for RV strain.  He had a hypercoagulable work-up that was positive for decreased protein C and protein S.  His readmission on 07/27/2023 for weakness was complicated by thrombocytopenia his HIT panel was negative acute hepatitis panel although his bilirubin had increased to 3.9 ultrasound showed sludge in the gallbladder with no stones with a 2.2 cm left hepatic cyst.  Patent portal veins on abdominal CT 07/31/2018.  He was treated with IVIG 08/03/2018 until 08/08/2018.  He is requiring IV sodium bicarbonate 85 cc an hour.  IV TPN and IV Solu-Cortef which was started 08/04/2018.  Continues on antibiotic therapy and has received cefepime 2 g every 12 hours.  During this admission he has been on cefepime, Primaxin vancomycin and rifaximin.   Nephrology was consulted due to acute nonoliguric renal failure.  Serological evaluation has been unrevealing he is undergoing evaluation for anti-GBM and ANCA testing.  Echo showed normal LV function with trivial pericardial effusion but no signs of endocarditis CT scan of the abdomen did reveal progressive splenomegaly with wedge-shaped areas of decreased attenuation suspicious for acute or subacute splenic infarcts he also had a 19mm left calculus with no significant hydronephrosis  Blood pressure of 120/53 pulse 101 temperature 97.6 O2 sats 92% CVP 8  Sodium 141 potassium 4.3 chloride 114 CO2 21 BUN 114 creatinine 2.35 glucose 204 phosphorus 3.2 magnesium 2.3 AST 131 ALT 47 Albumin 1.2.  WBC 17.6 hemoglobin 7.9 platelets  21  Urine output marginal 655 cc 08/05/2018 weight increase with anasarca  Hydrocortisone 50 mg every 6 hours IVIG, insulin sliding scale  Chest x-ray left base infiltrate cardiomegaly 08/05/2018      Objective:  Vital signs in last 24 hours:  Temp:  [97.5 F (36.4 C)-98.5 F (36.9 C)] 97.6 F (36.4 C) (07/01 0400) Pulse Rate:  [101-110] 104 (07/01 0900) Resp:  [16-30] 17 (07/01 0900) BP: (89-132)/(44-64) 117/53 (07/01 0900) SpO2:  [88 %-95 %] 92 % (07/01 0900) Weight:  [100.1 kg] 100.1 kg (07/01 0500)  Weight change: 3.8 kg Filed Weights   08/04/18 1011 08/05/18 0305 08/06/18 0500  Weight: 96.3 kg 97 kg 100.1 kg    Intake/Output: I/O last 3 completed shifts: In: 4491.9 [I.V.:3985.9; Blood:310; IV Piggyback:196] Out: 1225 [Urine:1225]   Intake/Output this shift:  Total I/O In: 599.6 [I.V.:599.6] Out: -  Responsive but lethargic CVS- RRR no murmurs rubs or gallops RS- CTA no wheezes or rales ABD-distended with marked scrotal edema EXT-3+ lower extremity edema   Basic Metabolic Panel: Recent Labs  Lab 07/31/18 0212  08/02/18 1040 08/03/18 0508 08/04/18 0307 08/05/18 0210 08/06/18 0600  NA 129*   < > 136 138 140 142 141  K 4.0   < > 5.1 4.8 3.7 4.1 4.3  CL 100   < > 107 114* 116* 115* 114*  CO2 19*   < > 13* 14* 14* 19* 21*  GLUCOSE 89   < > 87 211* 128* 168* 204*  BUN 21   < > 55* 66* 70* 80* 114*  CREATININE 0.67   < >  1.79* 1.96* 1.67* 1.93* 2.35*  CALCIUM 7.9*   < > 7.2* 6.8* 6.8* 6.9* 7.4*  MG 2.1  --   --   --   --  2.4 2.3  PHOS 1.7*  --   --   --   --  3.0 3.9   < > = values in this interval not displayed.    Liver Function Tests: Recent Labs  Lab 08/01/18 2350 08/03/18 0508 08/04/18 0307 08/05/18 0210 08/06/18 0600  AST 133* 964* 482* 217* 131*  ALT 32 186* 140* 82* 47*  ALKPHOS 163* 158* 127* 110 103  BILITOT 2.8* 3.9* 5.6* 6.0* 6.3*  PROT 4.3* 4.2* 4.5* 4.9* 4.8*  ALBUMIN 1.6* 1.6* 1.4* 1.3* 1.2*   No results for input(s):  LIPASE, AMYLASE in the last 168 hours. Recent Labs  Lab 07/31/18 0646  AMMONIA 37*    CBC: Recent Labs  Lab 08/01/18 2350  08/03/18 0508 08/03/18 1630 08/04/18 0307 08/05/18 0023 08/05/18 0210 08/05/18 1510 08/06/18 0600  WBC 20.0*   < > 22.4*  --  10.6*  --  13.8* 14.1* 17.6*  NEUTROABS 11.4*  --  8.4*  --  5.9  --  5.8 5.8  --   HGB 9.5*   < > 8.6*  --  8.0* 7.0* 7.0* 6.8* 7.9*  HCT 31.9*   < > 29.3*  --  27.6* 23.4* 23.6* 22.2* 25.6*  MCV 90.1   < > 89.3  --  90.2  --  89.7 89.2 91.1  PLT 30*   < > 22* 18* 12*  --  16* 22* 21*   < > = values in this interval not displayed.    Cardiac Enzymes: No results for input(s): CKTOTAL, CKMB, CKMBINDEX, TROPONINI in the last 168 hours.  BNP: Invalid input(s): POCBNP  CBG: Recent Labs  Lab 08/05/18 1539 08/05/18 1955 08/05/18 2339 08/06/18 0343 08/06/18 0735  GLUCAP 154* 141* 176* 198* 163*    Microbiology: Results for orders placed or performed during the hospital encounter of 07/29/2018  Culture, blood (Routine x 2)     Status: Abnormal   Collection Time: 07/09/2018  5:09 PM   Specimen: BLOOD LEFT FOREARM  Result Value Ref Range Status   Specimen Description   Final    BLOOD LEFT FOREARM Performed at [facility], [place] [address] [place], [place] [address]    Special Requests   Final    BOTTLES DRAWN AEROBIC AND ANAEROBIC Blood Culture adequate volume Performed at [place] [address], [place], [place] [address]    Culture  Setup Time   Final    GRAM POSITIVE COCCI AEROBIC BOTTLE ONLY CRITICAL RESULT CALLED TO, READ BACK BY AND VERIFIED WITH: B GREEN PHARMD 07/31/18 0035 JDW    Culture (A)  Final    MICROCOCCUS LUTEUS/LYLAE THE SIGNIFICANCE OF ISOLATING THIS ORGANISM FROM A SINGLE SET OF BLOOD CULTURES WHEN MULTIPLE SETS ARE DRAWN IS UNCERTAIN. PLEASE NOTIFY THE MICROBIOLOGY DEPARTMENT WITHIN ONE WEEK IF SPECIATION AND SENSITIVITIES ARE REQUIRED. Performed at [facility], [place] [address] [place], [place] [address]    Report Status 08/02/2018 FINAL  Final  Blood Culture ID Panel (Reflexed)     Status: None   Collection Time: 07/23/2018  5:09 PM  Result Value Ref Range Status   Enterococcus species NOT DETECTED NOT DETECTED Final   Listeria monocytogenes NOT DETECTED NOT DETECTED Final   Staphylococcus species NOT DETECTED NOT DETECTED Final   Staphylococcus aureus (  BCID) NOT DETECTED NOT DETECTED Final   Streptococcus species NOT DETECTED NOT DETECTED Final   Streptococcus agalactiae NOT DETECTED NOT DETECTED Final   Streptococcus pneumoniae NOT DETECTED NOT DETECTED Final   Streptococcus pyogenes NOT DETECTED NOT DETECTED Final   Acinetobacter baumannii NOT DETECTED NOT DETECTED Final   Enterobacteriaceae species NOT DETECTED NOT DETECTED Final   Enterobacter cloacae complex NOT DETECTED NOT DETECTED Final   Escherichia coli NOT DETECTED NOT DETECTED Final   Klebsiella oxytoca NOT DETECTED NOT DETECTED Final   Klebsiella pneumoniae NOT DETECTED NOT DETECTED Final   Proteus species NOT DETECTED NOT DETECTED Final   Serratia marcescens NOT DETECTED NOT DETECTED Final   Haemophilus influenzae NOT DETECTED NOT DETECTED Final   Neisseria meningitidis NOT DETECTED NOT DETECTED Final   Pseudomonas aeruginosa NOT DETECTED NOT DETECTED Final   Candida albicans NOT DETECTED NOT DETECTED Final   Candida glabrata NOT DETECTED NOT DETECTED Final   Candida krusei NOT DETECTED NOT DETECTED Final   Candida parapsilosis NOT DETECTED NOT DETECTED Final   Candida tropicalis NOT DETECTED NOT DETECTED Final    Comment: Performed at [facility], [place] [address], [place], [place] [address]  Culture, blood (Routine x 2)     Status: None   Collection Time: 08/02/2018  5:11 PM   Specimen: BLOOD RIGHT HAND  Result Value Ref Range Status   Specimen Description   Final    BLOOD RIGHT HAND Performed at [place] [address]  [place], [place] [address]    Special Requests   Final    BOTTLES DRAWN AEROBIC AND ANAEROBIC Blood Culture adequate volume Performed at [place] [address], [place], [place] [address]    Culture   Final    NO GROWTH 5 DAYS Performed at [facility], [place] [address], [place], [place] [address]    Report Status 08/04/2018 FINAL  Final  Novel Coronavirus,NAA,(SEND-OUT TO REF LAB - TAT 24-48 hrs); Hosp Order     Status: None   Collection Time: 07/16/2018  9:42 PM   Specimen: Nasopharyngeal Swab; Respiratory  Result Value Ref Range Status   SARS-CoV-2, NAA NOT DETECTED NOT DETECTED Final    Comment: (NOTE) This test was developed and its performance characteristics determined by [employer]. This test has not been FDA cleared or approved. This test has been authorized by FDA under an Emergency Use Authorization (EUA). This test is only authorized for the duration of time the declaration that circumstances exist justifying the authorization of the emergency use of in vitro diagnostic tests for detection of SARS-CoV-2 virus and/or diagnosis of COVID-19 infection under section 564(b)(1) of the Act, 21 U.S.C. 027OZD-6(U)(4), unless the authorization is terminated or revoked sooner. When diagnostic testing is negative, the possibility of a false negative result should be considered in the context of a patient's recent exposures and the presence of clinical signs and symptoms consistent with COVID-19. An individual without symptoms of COVID-19 and who is not shedding SARS-CoV-2 virus would expect to have a negative (not detected) result in this assay. Performed  At: [facility] [place], [place] [id number] Rush Farmer MD DG:[id number]    [place]  Final    Comment: Performed at [place] [address] [place], [place] [address]  MRSA PCR Screening     Status: None   Collection  Time: 07/29/2018 11:20 PM   Specimen: Nasal Mucosa; Nasopharyngeal  Result Value Ref  Range Status   MRSA by PCR NEGATIVE NEGATIVE Final    Comment:        The GeneXpert MRSA Assay (FDA approved for NASAL specimens only), is one component of a comprehensive MRSA colonization surveillance program. It is not intended to diagnose MRSA infection nor to guide or monitor treatment for MRSA infections. Performed at [facility], [place] [address] [place], [place] [address]   Culture, blood (routine x 2)     Status: None   Collection Time: 08/01/18  6:03 PM   Specimen: BLOOD  Result Value Ref Range Status   Specimen Description   Final    BLOOD SITE NOT SPECIFIED Performed at [place] [address] [place], [place] [address]    Special Requests   Final    BOTTLES DRAWN AEROBIC AND ANAEROBIC Blood Culture adequate volume Performed at [place] [address], [place], [place] [address]    Culture   Final    NO GROWTH 5 DAYS Performed at [facility], [place] [address], [place], [place] [address]    Report Status 08/06/2018 FINAL  Final  Culture, blood (routine x 2)     Status: Abnormal   Collection Time: 08/01/18  6:49 PM   Specimen: BLOOD  Result Value Ref Range Status   Specimen Description   Final    BLOOD RIGHT ANTECUBITAL Performed at [place] [address], [place], [place] [address]    Special Requests   Final    BOTTLES DRAWN AEROBIC ONLY Blood Culture adequate volume Performed at [place] [address], [place], [place] [address]    Culture  Setup Time   Final    GRAM POSITIVE COCCI IN CLUSTERS AEROBIC BOTTLE ONLY Organism ID to follow CRITICAL RESULT CALLED TO, READ BACK BY AND VERIFIED WITH: PHARMD WALFORD, D 1743 [id number] FCP    Culture (A)  Final    STAPHYLOCOCCUS SPECIES (COAGULASE NEGATIVE) THE SIGNIFICANCE OF ISOLATING THIS ORGANISM FROM A SINGLE SET OF  BLOOD CULTURES WHEN MULTIPLE SETS ARE DRAWN IS UNCERTAIN. PLEASE NOTIFY THE MICROBIOLOGY DEPARTMENT WITHIN ONE WEEK IF SPECIATION AND SENSITIVITIES ARE REQUIRED. Performed at [facility], [place] [address] [place], [place] [address]    Report Status 08/03/2018 FINAL  Final  Blood Culture ID Panel (Reflexed)     Status: Abnormal   Collection Time: 08/01/18  6:49 PM  Result Value Ref Range Status   Enterococcus species NOT DETECTED NOT DETECTED Final   Listeria monocytogenes NOT DETECTED NOT DETECTED Final   Staphylococcus species DETECTED (A) NOT DETECTED Final    Comment: Methicillin (oxacillin) resistant coagulase negative staphylococcus. Possible blood culture contaminant (unless isolated from more than one blood culture draw or clinical case suggests pathogenicity). No antibiotic treatment is indicated for blood  culture contaminants. CRITICAL RESULT CALLED TO, READ BACK BY AND VERIFIED WITH: PHARMD WALFORD, D 1743 [id number] FCP    Staphylococcus aureus (BCID) NOT DETECTED NOT DETECTED Final   Methicillin resistance DETECTED (A) NOT DETECTED Final    Comment: CRITICAL RESULT CALLED TO, READ BACK BY AND VERIFIED WITH: PHARMD WALFORD, D 1743 [id number] FCP    Streptococcus species NOT DETECTED NOT DETECTED Final   Streptococcus agalactiae NOT DETECTED NOT DETECTED Final   Streptococcus pneumoniae NOT DETECTED NOT DETECTED Final   Streptococcus pyogenes NOT DETECTED NOT DETECTED Final   Acinetobacter baumannii NOT DETECTED NOT DETECTED Final   Enterobacteriaceae species NOT DETECTED NOT DETECTED Final  Enterobacter cloacae complex NOT DETECTED NOT DETECTED Final   Escherichia coli NOT DETECTED NOT DETECTED Final   Klebsiella oxytoca NOT DETECTED NOT DETECTED Final   Klebsiella pneumoniae NOT DETECTED NOT DETECTED Final   Proteus species NOT DETECTED NOT DETECTED Final   Serratia marcescens NOT DETECTED NOT DETECTED Final   Haemophilus influenzae NOT DETECTED NOT DETECTED Final    Neisseria meningitidis NOT DETECTED NOT DETECTED Final   Pseudomonas aeruginosa NOT DETECTED NOT DETECTED Final   Candida albicans NOT DETECTED NOT DETECTED Final   Candida glabrata NOT DETECTED NOT DETECTED Final   Candida krusei NOT DETECTED NOT DETECTED Final   Candida parapsilosis NOT DETECTED NOT DETECTED Final   Candida tropicalis NOT DETECTED NOT DETECTED Final    Comment: Performed at [facility], [place] [address] [place] [address], [place], [place] [address]  MRSA PCR Screening     Status: None   Collection Time: 08/03/18 11:45 PM   Specimen: Nasal Mucosa; Nasopharyngeal  Result Value Ref Range Status   MRSA by PCR NEGATIVE NEGATIVE Final    Comment:        The GeneXpert MRSA Assay (FDA approved for NASAL specimens only), is one component of a comprehensive MRSA colonization surveillance program. It is not intended to diagnose MRSA infection nor to guide or monitor treatment for MRSA infections. Performed at [facility], [place] [address], [place], [place] [address]   Culture, Urine     Status: None (Preliminary result)   Collection Time: 08/05/18 10:03 AM   Specimen: Urine, Clean Catch  Result Value Ref Range Status   Specimen Description   Final    URINE, CLEAN CATCH Performed at [facility], [place] [address] [place], [place] [address]    Special Requests   Final    NONE Performed at [facility], [place] [address] [place], [place] [address]    Culture   Final    CULTURE REINCUBATED FOR BETTER GROWTH Performed at [facility], [place] [address] [place], [place] [address]    Report Status PENDING  Incomplete    Coagulation Studies: Recent Labs    08/04/18 0858 08/05/18 0210  LABPROT 22.4* 22.6*  INR 2.0* 2.0*    Urinalysis: Recent Labs    08/05/18 1003  COLORURINE AMBER*  LABSPEC 1.015  PHURINE 5.0  GLUCOSEU 50*  HGBUR MODERATE*  BILIRUBINUR SMALL*  KETONESUR NEGATIVE  PROTEINUR 30*  NITRITE  NEGATIVE  LEUKOCYTESUR MODERATE*      Imaging: Dg Chest Port 1 View  Result Date: 08/05/2018 CLINICAL DATA:  Respiratory failure.  Hypoxia. EXAM: PORTABLE CHEST 1 VIEW COMPARISON:  08/04/2018.  Six hundred twenty-seven 2020 FINDINGS: Left subclavian line in stable position. Cardiomegaly with mild pulmonary venous congestion. Mild bilateral interstitial prominence. Mild left base infiltrate remains. Tiny left pleural effusion cannot be excluded. No pneumothorax. Degenerative change thoracic spine. IMPRESSION: 1.  Left subclavian line stable position. 2. Cardiomegaly with mild pulmonary venous congestion and mild bilateral interstitial prominence. Tiny left pleural effusion cannot be excluded. Mild CHF cannot be excluded. 3.  Mild left base infiltrate remains.  No change. Electronically Signed   By: Marcello Moores  Register   On: 08/05/2018 05:47     Medications:   . heparin 1,100 Units/hr (08/06/18 1042)  .  sodium bicarbonate  infusion 1000 mL 35 mL/hr at 08/05/18 1735  . TPN ADULT (ION) 90 mL/hr at 08/06/18 0900  . TPN ADULT (ION)     . sodium chloride   Intravenous  Once  . camphor-menthol   Topical BID  . Chlorhexidine Gluconate Cloth  6 each Topical Daily  . hydrocortisone sod succinate (SOLU-CORTEF) inj  50 mg Intravenous Q6H  . Immune Globulin 10%  400 mg/kg Intravenous Q24 Hr x 5  . insulin aspart  0-9 Units Subcutaneous Q4H  . mouth rinse  15 mL Mouth Rinse BID  . sodium chloride flush  10-40 mL Intracatheter Q12H   acetaminophen **OR** acetaminophen, bisacodyl, ondansetron **OR** ondansetron (ZOFRAN) IV, sodium chloride flush  Assessment/ Plan:   Acute kidney injury.  No evidence of hydronephrosis CT scan although does have a 7 mm calculus noted.  Urinalysis was initially clear but now has white cells and red blood cells.  I think this would be inconsistent with a glomerulonephritis.  This could also indicate acute interstitial nephritis from the use of previous antibiotics.  Does  appear that there has been some drops in systolic blood pressure.  There was also use of intravenous contrast 6/21 and 07/28/2018.  So the differential would include acute tubular necrosis.  His urine sodium less than 10 would suggest this.  There is also evidence of liver injury and hepatorenal syndrome of course would be in the differential.  I have discussed this with Noe Gens with critical care medicine.  We will withdrawal nephrotoxins stop antibiotics avoid the use of contrast and nonsteroidal anti-inflammatory drugs.  Avoid intra-arterial procedures.  It may be worth while doing TEE.  He is receiving IV steroids.  Anti-GBM and ANCA tests are pending.  I do not really have any more to add.  The timing of his acute kidney injury appears to be coincident with systemic hypotension.  We shall wait and see what the serological evaluation shows.  This does not look to be consistent with HUS/TTP with no schistocytes seen on the peripheral smear.  Repeat urinalysis greater than 50 red blood cells greater than 50 white blood cells.  Azotemia with thrombocytopenia purpura.  Supporting the patient with CRRT be an option will need to discuss this with critical care.  My hunch is the overall progression is that the patient has decompensated liver disease.  Shock 2D echo does not reveal any evidence of congestive heart failure hepatic steatosis seen on CT scan may indicate some decompensated liver disease.  Does not appear to have sepsis and cultures have been negative.  The does not appear to be adrenal insufficiency  Anemia followed by hematology  Thrombocytopenia with splenomegaly with splenic infarcts would suggest infectious etiology although blood cultures have been negative.  This is reassuring.  Splenomegaly appears the infectious etiologies have been ruled out could be chronic splenomegaly from myeloproliferative disorder or underlying liver disease.  Discussed with Dr Lamonte Sakai  Consider CRRT will place  orders    LOS: [place] @TODAY @11 :33 AM

## 2018-08-06 NOTE — Progress Notes (Addendum)
Dr. Lamonte Sakai and I called the patients wife for a daily update.  Patients daughter and sister in law Lattie Haw) were on the line for the update as well as sisters request.  Case and care plan reviewed with family.  Discussed differential diagnosis and supportive care.  Reviewed potential need for dialysis and HD catheter insertion process.  Mimi indicates that she wants to think about HD / placement of HD catheter.  Family inquiring about possible transfer to [facility] as they are allowing family visitation.  Reinforced that we are here to provide the best care possible to [facility] and to support them as a family.  Discussed that we would be happy to help with transfer but given it is a family request (not a medical / tertiary need) that they would have to secure an accepting MD.  They asked if they could bring baby monitors or an ECHO DOT to the room to monitor the patient.  We reviewed that we would check with administration regarding the baby monitor / DOT approval.  Family is to call back for consent regarding the HD catheter placement. Will continue to provide supportive care to patient and family.   Time spent in conversation > 1.5 hour  Noe Gens, NP-C [facility] Pgr: [phone] or if no answer [phone] 08/06/2018, 2:26 PM

## 2018-08-06 NOTE — Progress Notes (Signed)
RN informed Dr. Lamonte Sakai that pt. Had only put out 50cc of urine today No new orders given.

## 2018-08-06 NOTE — Consult Note (Signed)
Consultation Note Date: 08/06/2018   Patient Name: Luis Cisneros  DOB: [birth year]  MRN: [id number]  Age / Sex: 78 y.o., male  PCP: Renold Genta Cresenciano Lick, MD Referring Physician: Dessa Phi, DO  Reason for Consultation: Establishing goals of care and Psychosocial/spiritual support  HPI/Patient Profile: 78 y.o. male  with past medical history of HLP, HTN, depression, PE, and multiple recent admission for weakness, confusion ,and FTT admitted on 08/04/2018 with SOB and fatigue.  He has been found to have complicated clinical picture with out clear etiology.  This includes elevated LFTs, AKI, normocytic anemia, severe thrombocytopenia, respiratory failure, splenomegaly, and splenic infarcts.  Palliative consulted for [place] and family support.    Clinical Assessment and Goals of Care: I saw and examined [name] this evening.  He opens his eyes and tracks but does not follow commands and cannot participate in conversation.  I called and was able to reach his wife.    She reports that she is frustrated by not knowing the underlying issue for her husbands decline.  She is also frustrated by what she feels is poor communication over the past few hospitalizations.  She has been in contact with the Office of Patient Experience regarding this and was recommended to request palliative care consult to "make sure he is as comfortable as he can be."  I explored this with her, and she is clear that she wants him to be as symptomatically controlled as possible, but she is not wanting to stop aggressive interventions and focus on comfort care.  We briefly reviewed his clinical course this hospitalization and she expressed feeling much better after getting updates from [place] with PCCM.  States that she is planning on talking with her again tomorrow and feels that this is the best way to keep her updated.   SUMMARY OF RECOMMENDATIONS   -  Full code, full scope  - His wife reports wanting to "get answers" as to what is going on with Mr. Copado.  She is invested in plan for continued aggressive interventions at this time.  She expressed that she was told by Office of Patient Experience to request palliative care consult for support.  She is very appreciative of updates by PCCM and wants to speak further with them prior to any other conversations with our team.  I asked how else we can be of support to her and she stated she "will think about it.  Code Status/Advance Care Planning:  Full code  Palliative Prophylaxis:   Aspiration and Frequent Pain Assessment  Additional Recommendations (Limitations, Scope, Preferences):  Full Scope Treatment    Prognosis:   Guarded  Discharge Planning: To Be Determined      Primary Diagnoses: Present on Admission: . Depression . Hyponatremia . Thrombocytopenia ([place]) . Encephalopathy . Elevated liver enzymes . Lactic acid acidosis . Anasarca . PE (pulmonary thromboembolism) ([place]) . Elevated INR . Splenic infarct . Hypoalbuminemia . Acute respiratory failure with hypoxemia ([place]) . Shock circulatory ([place]) . Acute kidney injury ([place]) . Normocytic anemia .  Unintentional weight loss   I have reviewed the medical record, interviewed the patient and family, and examined the patient. The following aspects are pertinent.  Past Medical History:  Diagnosis Date  . Ankle fracture 2006   right  . Arthritis   . Depression   . HOH (hard of hearing)    wears hearing aides in both ears  . Hyperlipidemia   . Hypertension   . Pulmonary embolism ([place])   . Vitamin D deficiency    Social History   Socioeconomic History  . Marital status: Married    Spouse name: Not on file  . Number of children: Not on file  . Years of education: Not on file  . Highest education level: Not on file  Occupational History  . Not on file  Social Needs  . Financial resource strain: Not on file  .  Food insecurity    Worry: Not on file    Inability: Not on file  . Transportation needs    Medical: Not on file    Non-medical: Not on file  Tobacco Use  . Smoking status: Former Smoker    Packs/day: 1.00    Types: Cigarettes    Quit date: 02/06/1987    Years since quitting: 31.5  . Smokeless tobacco: Never Used  Substance and Sexual Activity  . Alcohol use: Yes    Comment: 1 drink  4-5 days a week  . Drug use: No  . Sexual activity: Not on file  Lifestyle  . Physical activity    Days per week: Not on file    Minutes per session: Not on file  . Stress: Not on file  Relationships  . Social Herbalist on phone: Not on file    Gets together: Not on file    Attends religious service: Not on file    Active member of club or organization: Not on file    Attends meetings of clubs or organizations: Not on file    Relationship status: Not on file  Other Topics Concern  . Not on file  Social History Narrative  . Not on file   Family History  Problem Relation Age of Onset  . Aortic aneurysm Mother   . Heart disease Father    Scheduled Meds: . sodium chloride   Intravenous Once  . sodium chloride   Intravenous Once  . camphor-menthol   Topical BID  . Chlorhexidine Gluconate Cloth  6 each Topical Daily  . Chlorhexidine Gluconate Cloth  6 each Topical Daily  . hydrocortisone sod succinate (SOLU-CORTEF) inj  50 mg Intravenous Q6H  . Immune Globulin 10%  400 mg/kg Intravenous Q24 Hr x 5  . insulin aspart  0-9 Units Subcutaneous Q4H  . mouth rinse  15 mL Mouth Rinse BID  . sodium chloride flush  10-40 mL Intracatheter Q12H   Continuous Infusions: . heparin 1,050 Units/hr (08/06/18 0300)  .  sodium bicarbonate  infusion 1000 mL 35 mL/hr at 08/05/18 1735  . TPN ADULT (ION) 90 mL/hr at 08/06/18 0300   PRN Meds:.acetaminophen **OR** acetaminophen, bisacodyl, ondansetron **OR** ondansetron (ZOFRAN) IV, sodium chloride flush Medications Prior to Admission:  Prior to  Admission medications   Medication Sig Start Date End Date Taking? Authorizing Provider  buPROPion (WELLBUTRIN XL) 300 MG 24 hr tablet TAKE 1 TABLET BY MOUTH EVERY DAY 07/28/18  Yes Baxley, Cresenciano Lick, MD  camphor-menthol [facility]) lotion Apply 1 application topically 2 (two) times a day.   Yes [provider]  Cholecalciferol (VITAMIN D3) 250 MCG (10000 UT) capsule Take 10,000 Units by mouth daily.   Yes [provider]  feeding supplement, ENSURE ENLIVE, (ENSURE ENLIVE) LIQD Take 237 mLs by mouth daily. 07/29/18  Yes Sheikh, Omair Latif, DO  Multiple Vitamin (MULTIVITAMIN) tablet Take 1 tablet by mouth daily.     Yes [provider]  Rivaroxaban 15 & 20 MG TBPK Take as directed on package: Start with one 15mg  tablet by mouth twice a day with food. On Day 22, switch to one 20mg  tablet once a day with food. Patient taking differently: Take 15-20 mg by mouth See admin instructions. Take as directed on package: Start with 15 mg by mouth twice daily with food. On Day 22, switch to 20 mg daily with food. 07/22/18  Yes Eugenie Filler, MD  traMADol (ULTRAM) 50 MG tablet Take 1 tablet (50 mg total) by mouth every 6 (six) hours as needed. Patient taking differently: Take 50 mg by mouth every 6 (six) hours as needed for moderate pain.  12/19/17  Yes Stefanie Libel, MD  senna (SENOKOT) 8.6 MG TABS tablet Take 1 tablet (8.6 mg total) by mouth at bedtime. Patient not taking: Reported on 07/10/2018 07/22/18   Eugenie Filler, MD   Allergies  Allergen Reactions  . Cephalexin Nausea Only  . Feldene [Piroxicam] Hives, Nausea Only and Other (See Comments)    Makes pt feel loopy   Review of Systems Unable to perform  Physical Exam  General: Minimally responsive. Heart: Regular rate and rhythm. No murmur appreciated. Lungs: Decreased air movement, coarse throughout Abdomen: Soft, nontender, nondistended, positive bowel sounds.  Ext: 2+ LE edema Skin: Warm and dry, multiple ecchymosis  Neuro: Not following commands.  Moves 4 extremities.  Vital Signs: BP (!) 115/52   Pulse (!) 102   Temp 97.6 F (36.4 C) (Axillary)   Resp 16   Ht 5\' 10"  (1.778 m)   Wt 100.1 kg   SpO2 93%   BMI 31.66 kg/m  Pain Scale: CPOT   Pain Score: Asleep   SpO2: SpO2: 93 % O2 Device:SpO2: 93 % O2 Flow Rate: .O2 Flow Rate (L/min): 3 L/min  IO: Intake/output summary:   Intake/Output Summary (Last 24 hours) at 08/06/2018 0801 Last data filed at 08/06/2018 0600 Gross per 24 hour  Intake 2089.91 ml  Output 450 ml  Net 1639.91 ml    LBM: Last BM Date: 08/05/18 Baseline Weight: Weight: 82.1 kg Most recent weight: Weight: 100.1 kg     Palliative Assessment/Data:   Flowsheet Rows     Most Recent Value  Intake Tab  Referral Department  Critical care  Unit at Time of Referral  ICU  Palliative Care Primary Diagnosis  Other (Comment) [Hemetologic]  Date Notified  08/04/18  Palliative Care Type  New Palliative care  Reason for referral  Clarify Goals of Care, Psychosocial or Spiritual support  Date of Admission  07/29/2018  Date first seen by Palliative Care  08/05/18  # of days Palliative referral response time  1 Day(s)  # of days IP prior to Palliative referral  5  Clinical Assessment  Palliative Performance Scale Score  30%  Psychosocial & Spiritual Assessment  Palliative Care Outcomes  Patient/Family meeting held?  Yes  Who was at the meeting?  Wife via phone  Palliative Care Outcomes  Provided psychosocial or spiritual support      Time In: 1900 Time Out: 1950 Time Total: 50 Greater than 50%  of this time  was spent counseling and coordinating care related to the above assessment and plan.  Signed by: Micheline Rough, MD   Please contact Palliative Medicine Team phone at [phone] for questions and concerns.  For individual provider: See Shea Evans

## 2018-08-06 NOTE — Progress Notes (Addendum)
PULMONARY / CRITICAL CARE MEDICINE   NAME:  Luis Cisneros, MRN:  [id number], DOB:  1940/03/02, LOS: 7 ADMISSION DATE:  07/26/2018, CONSULTATION DATE:  08/01/2018 REFERRING MD:  Lonny Prude / [facility], CHIEF COMPLAINT:  Weakness   BRIEF HISTORY:    78 y/o M, former smoker, admitted 6/24 per [facility] with SOB and fatigue.    He has a prior history of PE (2006).  The patient has had 3 admits in the last 6 months - 6/12 to 6/16 and 6/21 to 6/23 for weakness, confusion, fever and failure to thrive. 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  ECHO at that time was negative for RV strain. Hypercoaguable work-up was notable for decreased protein S and protein C.   Re-admitted 6/21 with weakness, confusion and concern for failure to thrive.  Work up found elevated LFT's and worsening thrombocytopenia. HIT antibody negative.  Acute hepatitis panel negative.  T. Bili 3.9.  Abd US showed gallbladder sludge, no stones, CBD 36mm, hepatic steatosis vs hepatocellular disease, 2.2 cm left lobe cyst, patent portal vein. CT ABD/Pelvis 6/24 showed progressive splenomegaly suspicious for splenic infarcts & a 74mm proximal left ureter stone without hydronephrosis.     He returned 6/24 from his PCP for increased weakness and altered mental status. He reportedly fell while trying to get out of the car on 6/23.  Wife reported increased swelling in LE's, constipation, difficulty initiating urination, confusion, intermittent fevers and poor appetite. Additionally, she has noted some spotting of blood on tissue when patient had BM but no other bleeding.  Wife reports  18 lb weight loss since January. 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  She believes this fall is the source of his splenic infarct and decline.    SIGNIFICANT PAST MEDICAL HISTORY   HTN, hyperlipidemia, depression, hypercoagulability / DVT (2006, 2020)  SIGNIFICANT EVENTS:  6/24 Admit with weakness,  fatigue / AMS 6/27 hypotensive, no response to fluids/ alb bolus > neo then levophed/versed and stress HC 6/28 AKI, worsening LFT's, AMS  STUDIES:   Liver Doppler 6/21 >> patent flow of portal vein CT ABD 6/24 >> progressive splenomegaly with wedge-shaped areas of decreased attenuation peripherally suspicious for subacute splenic infarcts, unchanged position of left ureteral calculus, no significant hydronephrosis, interval development of diffuse soft tissue edema, ascites and bilateral pleural effusions consistent with anasarca.  CT ABD/Pelvis 6/24 >> negative  ECHO 6/26 >> unremarkable with RAP est 10 mmhg and nl RV /LV fxn, no septal defect  CULTURES:  [facility] 08/01/2018 >> 1/2 micrococcus Luteus / Lylae  BCID 6/26 >> coag neg staph COVID 6/24 >> negative  MRSA 6/24 >> negative  BCx2 6/26 >> coag neg staph 1/2  BCx2 6/26 >> negative CMV IgM 6/29 >> negative  UA 6/30 >> mod leukocytes, 30 protein, RBC >50, WBC >50  AUTOIMMUNE:  ADAMS13 6/29 >>  Coombs 6/29 >> negative  ANA 6/29 >> negative Anti-DNA 6/29 >> no valid result obtained  CCP 6/29 >> 20 (upper limit normal 19) RA 6/29 >> negative ANCA 6/29 >> negative Sed Rate 6/29 >> 9  C3 6/29 >> 56  C4 6/30 >> 28 Anti GBM 6/29 >> IgG 6/30 >> 1,979 (has received IVIG)  ANTIBIOTICS:  Rafixamin 6/24 > 6/25 Primaxin 6/27 >> 6/29 Vanc 6/26 >> 6/29 Cefepime 6/29 >> 6/30  LINES/TUBES:  L radial Art line 6/27 >> out L Daniel CVL 6/27 >>  CONSULTANTS:  Hematology GI   Cardiology 6/26  PCCM 6/26  Nephrology 6/30   SUBJECTIVE:  Afebrile.  I/O - 450 UOP 24h / 2.3L+ last 24h, 14.7L + for admit.    CONSTITUTIONAL: BP (!) 115/52   Pulse (!) 102   Temp 97.6 F (36.4 C) (Axillary)   Resp 16   Ht 5\' 10"  (1.778 m)   Wt 100.1 kg   SpO2 93%   BMI 31.66 kg/m      Intake/Output Summary (Last 24 hours) at 08/06/2018 0827 Last data filed at 08/06/2018 0600 Gross per 24 hour  Intake 2089.91 ml  Output 450 ml  Net 1639.91 ml    CVP:   [6 mmHg-8 mmHg] 8 mmHg    PHYSICAL EXAM: General: ill appearing adult male lying in bed in NAD  HEENT: MM pink/moist, sclera edema / icterus Neuro: Looks at provider when name called, squeezed hand, generalized weakness, will spontaneously move CV: s1s2 rrr, no m/r/g PULM: even/non-labored, lungs bilaterally with diminished bases  WU:JWJX, non-tender, bsx4 active  Extremities: warm/dry, generalized 1-2+ pitting edema  Skin: multiple areas of bruising, petechial rash on chest   RESOLVED PROBLEM LIST    ASSESSMENT AND PLAN    Altered Mental Status  -in setting of AKI, unexplained hematologic dysfunction, no evidence of bacterial infection P: Supportive care  Minimize sedating medications  Ensure adequate MAP / perfusion pressure   Fever Shock with MODS -blood cultures concerning for contaminant, only hardware is left hip which is remote -NOTE BLOOD CULTURES 1/4 with coag neg staph -possible GI source ?, TTP / HUS / MHA (less likely), malignancy  P:  Follow cultures / autoimmune evaluation  Hold abx, monitor off for now  Continue stress dose steroids   Elevated LFT's  Hyperammoniemia  Suspected Shock Liver  Severe Thrombocytopenia  -elevated LDH, enlarged spleen on CT, negative schistocytes on DIC panel, HIT negative, acute hepatitis panel negative, GB sludge but no stones, patent portal vein, HIV negative, EBV negative, GGT 83.  -Protein C deficiency noted on prior labs  -? Myeloproliferative disorder, hematologic malignancy, TTP (less likely), underlying unknown liver disease? Normocytic Anemia  P: GI signed off > last rec's for no NGT placement due to bleeding risks  Avoid hepatotoxic agents  Follow LDH  Heparin gtt given AKI, thrombocytopenia and risk of bleeding in ICU  Platelets, IVIG per Heme/ONC  AKI  -urine Na <10, consistent with pre-renal failure / ATN -received contrast x2, hypotensive episodes and abx (possible interstitial nephritis) AG Metabolic  Acidosis  -progressive lactic acidosis with hypotension, worsening renal function. Thrombocytopenia & splenomegaly not likely due to passive congestion based on ECHO findings 6/26  P: Continue bicarb gtt Appreciate Nephrology input  Consider CVVHD for metabolic acidosis Await [id number] / autoimmune testing  Trend BMP / urinary output Replace electrolytes as indicated Avoid nephrotoxic agents, ensure adequate renal perfusion  Acute Hypoxic Respiratory Failure  -secondary to PE with concern for infarct of LLL  Pulmonary Embolism (L), DVT  P: O2 for sats >90% Heparin gtt as above   Multiple Pulmonary Nodules P: Outpatient follow up  Moderate to Severe Protein Calorie Malnutrition  P: TPN per pharmacy    Family Discussion:  Will call wife with attending and update this afternoon.  She is requesting a friend be on the line for the discussion.   LABS   Glucose Recent Labs  Lab 08/05/18 1136 08/05/18 1539 08/05/18 1955 08/05/18 2339 08/06/18 0343 08/06/18 0735  GLUCAP 151* 154* 141* 176* 198* 163*  BMET Recent Labs  Lab 08/04/18 0307 08/05/18 0210 08/06/18 0600  NA 140 142 141  K 3.7 4.1 4.3  CL 116* 115* 114*  CO2 14* 19* 21*  BUN 70* 80* 114*  CREATININE 1.67* 1.93* 2.35*  GLUCOSE 128* 168* 204*    Liver Enzymes Recent Labs  Lab 08/04/18 0307 08/05/18 0210 08/06/18 0600  AST 482* 217* 131*  ALT 140* 82* 47*  ALKPHOS 127* 110 103  BILITOT 5.6* 6.0* 6.3*  ALBUMIN 1.4* 1.3* 1.2*    Electrolytes Recent Labs  Lab 07/31/18 0212  08/04/18 0307 08/05/18 0210 08/06/18 0600  CALCIUM 7.9*   < > 6.8* 6.9* 7.4*  MG 2.1  --   --  2.4 2.3  PHOS 1.7*  --   --  3.0 3.9   < > = values in this interval not displayed.    CBC Recent Labs  Lab 08/05/18 0210 08/05/18 1510 08/06/18 0600  WBC 13.8* 14.1* 17.6*  HGB 7.0* 6.8* 7.9*  HCT 23.6* 22.2* 25.6*  PLT 16* 22* 21*    ABG Recent Labs  Lab 08/01/18 1508 08/02/18 0425  PHART 7.379 7.345*   PCO2ART 26.2* 21.0*  PO2ART 71.9* 65.5*    Coag's Recent Labs  Lab 08/01/18 1035 08/04/18 0858 08/05/18 0210  APTT 61*  --   --   INR 1.7* 2.0* 2.0*    Sepsis Markers Recent Labs  Lab 08/02/18 0130 08/02/18 0642 08/02/18 0919 08/02/18 1040 08/03/18 0508 08/04/18 0307  LATICACIDVEN 8.4* 8.7*  --  8.6*  --   --   PROCALCITON  --   --  7.58  --  8.20 7.36    CC Time: 35 minutes  Noe Gens, NP-C [facility] Pgr: [phone] or if no answer [phone] 08/06/2018, 8:27 AM

## 2018-08-06 NOTE — Progress Notes (Addendum)
PROGRESS NOTE    Luis Cisneros  ZOX:[id number] DOB: 1940-04-04 DOA: 07/13/2018 PCP: Elby Showers, MD     Brief Narrative:  Luis Cisneros is a 78 y/o M, former smoker, admitted 6/24 per [facility] with SOB and fatigue. Pt has a prior history of PE (2006). Patient has had 3 admits in the last 6 months: 6/12-6/16 and 6/21-6/23 for weakness, confusion, fever and failure to thrive. 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 Echo at that time was negative for RV strain. Hypercoaguable work-up was notable for decreased protein S and protein C.Re-admitted 6/21 with weakness, confusion and concern for failure to thrive. Work up found elevated LFT's and worsening thrombocytopenia. HIT antibody negative. Acute hepatitis panel negative. T. Bili 3.9. Abd US showed gallbladder sludge, no stones, CBD 72mm, hepatic steatosis vs hepatocellular disease, 2.2 cm left lobe cyst, patent portal vein. CT Abd/Pelvis 6/24 showed progressive splenomegaly suspicious for splenic infarcts &a 30mm proximal left ureter stone without hydronephrosis. Pt returned 6/24 from his PCP for increased weakness and altered mental status. He reportedly fell while trying to get out of the car on 6/23. Wife reported increased swelling in LE's, constipation, difficulty initiating urination, confusion, intermittent fevers and poor appetite. Additionally, she has noted some spotting of blood on tissue when patient had BM but no other bleeding.  Patient admitted for further management by [facility].    On 08/01/2018 patient noted to be hypotensive and critical team was consulted.  Patient was subsequently placed on pressors, with improvement in his blood pressure but worsening of overall condition.  Patient was then transferred back to [facility] 08/05/2018.  Of note, patient current status is very critical, with multiorgan failure and currently minimally responsive.  Critical care, heme-onc, ID, Nephrology currently on board.   Palliative care consulted.   New events last 24 hours / Subjective: Remains confused, critically ill this morning  Assessment & Plan:   Principal Problem:   Encephalopathy Active Problems:   PE (pulmonary thromboembolism) (HCC)   Depression   Generalized weakness   Constipation   Hyponatremia   Thrombocytopenia (HCC)   Elevated liver enzymes   Lactic acid acidosis   Anasarca   Elevated INR   Splenic infarct   Hypoalbuminemia   Acute respiratory failure with hypoxemia (HCC)   Shock circulatory (HCC)   Acute kidney injury ([place])   Normocytic anemia   Unintentional weight loss    Acute metabolic encephalopathy/MODS -Unclear etiology -Cardiology did not feel PE and RV dysfunction is the etiology of patient's overall picture.  He has failure to thrive, malnutrition leading to hypoalbuminemia, diffuse edema.  No recommendation for right heart cath at this time.  Did not strongly suspect heart failure or right ventricular failure as severe contributing factor to patient's overall decline -Currently afebrile, with leukocytosis (on stress dose steroids). Blood culture with possible contaminant (ID on board, agrees with contaminant, stopped IV cefepime). COVID negative. Chest x-ray with some mild left base infiltrate, some mild pulmonary vascular congestion. Off antibiotics now. CMV IgM pending. ID did not feel patient has any treatable bacterial infection  -CT head unremarkable -Continue stress dose steroids due to hypotension during hospitalization requiring pressors  -Appreciate numerous consultant services including ID, PCCM, Heme-onc, Nephrology, GI, Cardiology  -Guarded prognosis, monitor closely -Palliative consult pending  Severe thrombocytopenia/normocytic anemia -No active bleeding -Unclear etiology, ?underlying malignancy, liver disease  -Negative schistocytes on DIC panel, no evidence of TTP. HIT negative -ADAMTS 13 activity  pending  -Continue IVIG, transfuse  platelets/PRBC as needed as per heme-onc -Heme-onc consulted and following  -Minimize procedures to prevent significant bleeding  Elevated LFT's/Hyperammoniemia -Previous normal LFT back in October 2019 -Elevated LDH, enlarged spleen on CT, acute hepatitis panel negative, GB sludge but no stones, patent portal vein, HIV negative, EBV negative, GGT 83 -GI signed off 6/30. Elevated LFT thought to be due to ischemic hepatopathy in setting of critical illness, recent hypotension  -LFT improving slowly   AKI -Cr at admission 0.7  -Nephrology consulted and following -Could indicate acute interstitial nephritis from previous antibiotics, ATN from hypotension and IV contrast -Stop all nephrotoxins including antibiotics, contrast, NSAID -Anti-GBM, ANCA testing pending -Nephrology feels patient's overall progression to be consistent with decompensated liver disease -Cr continues to worsen -On bicarb gtt for metabolic acidosis -Consideration for CRRT  Acute hypoxemic respiratory failure -Question secondary to fluid overload state in addition to PE -Continue supplemental O2  Pulmonary embolism (L), DVT  -Protein C deficiency noted on prior labs, ??underlying malignancy -Continue heparin gtt given AKI / uremia and thrombocytopenia with risk of bleeding.  Patient was on Xarelto prior to admission  Moderate to severe protein calorie malnutrition -TPN per pharmacy  [place] -Palliative consulted  DVT prophylaxis: Xarelto PTA, currently on IV heparin Code Status: Full Family Communication: Spoke with wife over the phone  Disposition Plan: Pending improvement, goals of care conversation   Consultants:   PCCM  Cardiology   Infectious disease  Hematology/oncology  GI  Palliative care medicine  Nephrology  Procedures:   None  Antimicrobials:  Anti-infectives (From admission, onward)   Start     Dose/Rate Route Frequency Ordered Stop   08/04/18 1200  ceFEPIme (MAXIPIME) 2  g in sodium chloride 0.9 % 100 mL IVPB  Status:  Discontinued     2 g 200 mL/hr over 30 Minutes Intravenous Every 12 hours 08/04/18 1106 08/05/18 1004   08/03/18 1000  vancomycin (VANCOCIN) IVPB 1000 mg/200 mL premix  Status:  Discontinued     1,000 mg 200 mL/hr over 60 Minutes Intravenous Every 24 hours 08/02/18 1705 08/04/18 1057   08/02/18 1715  vancomycin (VANCOCIN) 1,500 mg in sodium chloride 0.9 % 500 mL IVPB     1,500 mg 250 mL/hr over 120 Minutes Intravenous  Once 08/02/18 1700 08/02/18 2023   08/02/18 1700  imipenem-cilastatin (PRIMAXIN) 500 mg in sodium chloride 0.9 % 100 mL IVPB  Status:  Discontinued     500 mg 200 mL/hr over 30 Minutes Intravenous Every 8 hours 08/02/18 1652 08/04/18 1107   07/18/2018 2215  rifaximin (XIFAXAN) tablet 550 mg  Status:  Discontinued     550 mg Oral 2 times daily 08/03/2018 2206 07/31/18 1203       Objective: Vitals:   08/06/18 1312 08/06/18 1315 08/06/18 1329 08/06/18 1437  BP: (!) 124/54 (!) 124/54 (!) 146/58 (!) 143/54  Pulse: (!) 104 (!) 101 (!) 103 (!) 105  Resp: 18 19 19 20   Temp: 98.1 F (36.7 C)  (!) 97 F (36.1 C) (!) 97.4 F (36.3 C)  TempSrc: Axillary  Axillary Axillary  SpO2: 91% (!) 89% 91% 91%  Weight:      Height:        Intake/Output Summary (Last 24 hours) at 08/06/2018 1438 Last data filed at 08/06/2018 1400 Gross per 24 hour  Intake 2805.32 ml  Output 450 ml  Net 2355.32 ml   Filed Weights   08/04/18 1011 08/05/18 0305 08/06/18 0500  Weight: 96.3 kg  97 kg 100.1 kg    Examination:  General exam: Appears lethargic, confused   Respiratory system: Clear to auscultation. Increased RR 19  Cardiovascular system: S1 & S2 heard, RRR. No JVD, murmurs, rubs, gallops or clicks. +pitting pedal edema. Gastrointestinal system: Abdomen is nondistended, soft and +anasarca  Central nervous system: Alert but not interactive  Extremities: Symmetric  Skin: +Petechia   Data Reviewed: I have personally reviewed following labs and  imaging studies  CBC: Recent Labs  Lab 08/01/18 2350  08/03/18 0508 08/03/18 1630 08/04/18 0307 08/05/18 0023 08/05/18 0210 08/05/18 1510 08/06/18 0600  WBC 20.0*   < > 22.4*  --  10.6*  --  13.8* 14.1* 17.6*  NEUTROABS 11.4*  --  8.4*  --  5.9  --  5.8 5.8  --   HGB 9.5*   < > 8.6*  --  8.0* 7.0* 7.0* 6.8* 7.9*  HCT 31.9*   < > 29.3*  --  27.6* 23.4* 23.6* 22.2* 25.6*  MCV 90.1   < > 89.3  --  90.2  --  89.7 89.2 91.1  PLT 30*   < > 22* 18* 12*  --  16* 22* 21*   < > = values in this interval not displayed.   Basic Metabolic Panel: Recent Labs  Lab 07/31/18 0212  08/02/18 1040 08/03/18 0508 08/04/18 0307 08/05/18 0210 08/06/18 0600  NA 129*   < > 136 138 140 142 141  K 4.0   < > 5.1 4.8 3.7 4.1 4.3  CL 100   < > 107 114* 116* 115* 114*  CO2 19*   < > 13* 14* 14* 19* 21*  GLUCOSE 89   < > 87 211* 128* 168* 204*  BUN 21   < > 55* 66* 70* 80* 114*  CREATININE 0.67   < > 1.79* 1.96* 1.67* 1.93* 2.35*  CALCIUM 7.9*   < > 7.2* 6.8* 6.8* 6.9* 7.4*  MG 2.1  --   --   --   --  2.4 2.3  PHOS 1.7*  --   --   --   --  3.0 3.9   < > = values in this interval not displayed.   GFR: Estimated Creatinine Clearance: 30.7 mL/min (A) (by C-G formula based on SCr of 2.35 mg/dL (H)). Liver Function Tests: Recent Labs  Lab 08/01/18 2350 08/03/18 0508 08/04/18 0307 08/05/18 0210 08/06/18 0600  AST 133* 964* 482* 217* 131*  ALT 32 186* 140* 82* 47*  ALKPHOS 163* 158* 127* 110 103  BILITOT 2.8* 3.9* 5.6* 6.0* 6.3*  PROT 4.3* 4.2* 4.5* 4.9* 4.8*  ALBUMIN 1.6* 1.6* 1.4* 1.3* 1.2*   No results for input(s): LIPASE, AMYLASE in the last 168 hours. Recent Labs  Lab 07/31/18 0646  AMMONIA 37*   Coagulation Profile: Recent Labs  Lab 07/10/2018 1709 08/01/18 1035 08/04/18 0858 08/05/18 0210  INR 1.5* 1.7* 2.0* 2.0*   Cardiac Enzymes: No results for input(s): CKTOTAL, CKMB, CKMBINDEX, TROPONINI in the last 168 hours. BNP (last 3 results) No results for input(s): PROBNP in the  last 8760 hours. HbA1C: No results for input(s): HGBA1C in the last 72 hours. CBG: Recent Labs  Lab 08/05/18 1955 08/05/18 2339 08/06/18 0343 08/06/18 0735 08/06/18 1149  GLUCAP 141* 176* 198* 163* 167*   Lipid Profile: Recent Labs    08/05/18 0210  TRIG 174*   Thyroid Function Tests: No results for input(s): TSH, T4TOTAL, FREET4, T3FREE, THYROIDAB in the last 72 hours. Anemia Panel:  No results for input(s): VITAMINB12, FOLATE, FERRITIN, TIBC, IRON, RETICCTPCT in the last 72 hours. Sepsis Labs: Recent Labs  Lab 08/02/18 0130 08/02/18 3532 08/02/18 0919 08/02/18 1040 08/03/18 0508 08/04/18 0307 08/06/18 1217  PROCALCITON  --   --  7.58  --  8.20 7.36  --   LATICACIDVEN 8.4* 8.7*  --  8.6*  --   --  3.9*    Recent Results (from the past 240 hour(s))  Culture, blood (routine x 2)     Status: None   Collection Time: 07/28/18  8:49 PM   Specimen: BLOOD  Result Value Ref Range Status   Specimen Description   Final    BLOOD BLOOD RIGHT HAND Performed at [facility], [place] [address], [place], [place] [address]    Special Requests   Final    BOTTLES DRAWN AEROBIC AND ANAEROBIC Blood Culture adequate volume Performed at [facility] [address], [place], [place] [address]    Culture   Final    NO GROWTH 5 DAYS Performed at [facility], [place] [address], [place], [place] [address]    Report Status 08/02/2018 FINAL  Final  Culture, blood (routine x 2)     Status: None   Collection Time: 07/28/18  8:49 PM   Specimen: BLOOD RIGHT ARM  Result Value Ref Range Status   Specimen Description   Final    BLOOD RIGHT ARM Performed at [facility], [place] [address], [place], [place] [address]    Special Requests   Final    BOTTLES DRAWN AEROBIC AND ANAEROBIC Blood Culture adequate volume Performed at [facility] [address], [place], [place] [address]    Culture  Setup Time   Final    GRAM POSITIVE  RODS ANAEROBIC BOTTLE ONLY CRITICAL RESULT CALLED TO, READ BACK BY AND VERIFIED WITH: RN D DESOTA [id number] [phone]    Culture   Final    GRAM POSITIVE RODS UNABLE TO FURTHER ID. CALL MICRO LAB IF ID IS NEEDED. Performed at [facility], [place] [address] [place], [place] [address]    Report Status 08/04/2018 FINAL  Final  Culture, blood (Routine x 2)     Status: Abnormal   Collection Time: 07/24/2018  5:09 PM   Specimen: BLOOD LEFT FOREARM  Result Value Ref Range Status   Specimen Description   Final    BLOOD LEFT FOREARM Performed at [facility] [address], [place], [place] [address]    Special Requests   Final    BOTTLES DRAWN AEROBIC AND ANAEROBIC Blood Culture adequate volume Performed at [facility] [address], [place], [place] [address]    Culture  Setup Time   Final    GRAM POSITIVE COCCI AEROBIC BOTTLE ONLY CRITICAL RESULT CALLED TO, READ BACK BY AND VERIFIED WITH: B GREEN PHARMD 07/31/18 0035 JDW    Culture (A)  Final    MICROCOCCUS LUTEUS/LYLAE THE SIGNIFICANCE OF ISOLATING THIS ORGANISM FROM A SINGLE SET OF BLOOD CULTURES WHEN MULTIPLE SETS ARE DRAWN IS UNCERTAIN. PLEASE NOTIFY THE MICROBIOLOGY DEPARTMENT WITHIN ONE WEEK IF SPECIATION AND SENSITIVITIES ARE REQUIRED. Performed at [facility], [place] [address] [place],  [address]    Report Status 08/02/2018 FINAL  Final  Blood Culture ID Panel (Reflexed)     Status: None   Collection Time: 07/28/2018  5:09 PM  Result Value Ref Range Status   Enterococcus species  NOT DETECTED NOT DETECTED Final   Listeria monocytogenes NOT DETECTED NOT DETECTED Final   Staphylococcus species NOT DETECTED NOT DETECTED Final   Staphylococcus aureus (BCID) NOT DETECTED NOT DETECTED Final   Streptococcus species NOT DETECTED NOT DETECTED Final   Streptococcus agalactiae NOT DETECTED NOT DETECTED Final   Streptococcus pneumoniae NOT DETECTED NOT DETECTED Final   Streptococcus  pyogenes NOT DETECTED NOT DETECTED Final   Acinetobacter baumannii NOT DETECTED NOT DETECTED Final   Enterobacteriaceae species NOT DETECTED NOT DETECTED Final   Enterobacter cloacae complex NOT DETECTED NOT DETECTED Final   Escherichia coli NOT DETECTED NOT DETECTED Final   Klebsiella oxytoca NOT DETECTED NOT DETECTED Final   Klebsiella pneumoniae NOT DETECTED NOT DETECTED Final   Proteus species NOT DETECTED NOT DETECTED Final   Serratia marcescens NOT DETECTED NOT DETECTED Final   Haemophilus influenzae NOT DETECTED NOT DETECTED Final   Neisseria meningitidis NOT DETECTED NOT DETECTED Final   Pseudomonas aeruginosa NOT DETECTED NOT DETECTED Final   Candida albicans NOT DETECTED NOT DETECTED Final   Candida glabrata NOT DETECTED NOT DETECTED Final   Candida krusei NOT DETECTED NOT DETECTED Final   Candida parapsilosis NOT DETECTED NOT DETECTED Final   Candida tropicalis NOT DETECTED NOT DETECTED Final    Comment: Performed at [facility], [address], [place], [place] [address]  Culture, blood (Routine x 2)     Status: None   Collection Time: 07/22/2018  5:11 PM   Specimen: BLOOD RIGHT HAND  Result Value Ref Range Status   Specimen Description   Final    BLOOD RIGHT HAND Performed at [facility] [address] [place], [place] [address]    Special Requests   Final    BOTTLES DRAWN AEROBIC AND ANAEROBIC Blood Culture adequate volume Performed at [facility] [address], [place], [place] [address]    Culture   Final    NO GROWTH 5 DAYS Performed at [facility], [place] [address], [place], [place] [address]    Report Status 08/04/2018 FINAL  Final  Novel Coronavirus,NAA,(SEND-OUT TO REF LAB - TAT 24-48 hrs); Hosp Order     Status: None   Collection Time: 07/11/2018  9:42 PM   Specimen: Nasopharyngeal Swab; Respiratory  Result Value Ref Range Status   SARS-CoV-2, NAA NOT DETECTED NOT DETECTED Final    Comment: (NOTE) This  test was developed and its performance characteristics determined by [employer]. This test has not been FDA cleared or approved. This test has been authorized by FDA under an Emergency Use Authorization (EUA). This test is only authorized for the duration of time the declaration that circumstances exist justifying the authorization of the emergency use of in vitro diagnostic tests for detection of SARS-CoV-2 virus and/or diagnosis of COVID-19 infection under section 564(b)(1) of the Act, 21 U.S.C. 093ATF-5(D)(3), unless the authorization is terminated or revoked sooner. When diagnostic testing is negative, the possibility of a false negative result should be considered in the context of a patient's recent exposures and the presence of clinical signs and symptoms consistent with COVID-19. An individual without symptoms of COVID-19 and who is not shedding SARS-CoV-2 virus would expect to have a negative (not detected) result in this assay. Performed  At: [facility] [place], [place] [id number] Rush Farmer MD WC:[id number]    [place]  Final    Comment: Performed at [facility] [address] [place], [place] [address]  MRSA PCR Screening     Status: None   Collection Time: 07/27/2018 11:20 PM   Specimen: Nasal Mucosa; Nasopharyngeal  Result Value Ref Range Status   MRSA by PCR NEGATIVE NEGATIVE Final    Comment:        The GeneXpert MRSA Assay (FDA approved for NASAL specimens only), is one component of a comprehensive MRSA colonization surveillance program. It is not intended to diagnose MRSA infection nor to guide or monitor treatment for MRSA infections. Performed at [facility], [place] [address], [place], [place] [address]   Culture, blood (routine x 2)     Status: None   Collection Time: 08/01/18  6:03 PM   Specimen: BLOOD  Result Value Ref Range Status   Specimen  Description   Final    BLOOD SITE NOT SPECIFIED Performed at [facility] [address] [place], [place] [address]    Special Requests   Final    BOTTLES DRAWN AEROBIC AND ANAEROBIC Blood Culture adequate volume Performed at [facility] [address], [place], [place] [address]    Culture   Final    NO GROWTH 5 DAYS Performed at [facility], [place] [address], [place], [place] [address]    Report Status 08/06/2018 FINAL  Final  Culture, blood (routine x 2)     Status: Abnormal   Collection Time: 08/01/18  6:49 PM   Specimen: BLOOD  Result Value Ref Range Status   Specimen Description   Final    BLOOD RIGHT ANTECUBITAL Performed at [facility] [address] [place], [place] [address]    Special Requests   Final    BOTTLES DRAWN AEROBIC ONLY Blood Culture adequate volume Performed at [facility] [address] [place], [place] [address]    Culture  Setup Time   Final    GRAM POSITIVE COCCI IN CLUSTERS AEROBIC BOTTLE ONLY Organism ID to follow CRITICAL RESULT CALLED TO, READ BACK BY AND VERIFIED WITH: PHARMD WALFORD, D 1743 [id number] FCP    Culture (A)  Final    STAPHYLOCOCCUS SPECIES (COAGULASE NEGATIVE) THE SIGNIFICANCE OF ISOLATING THIS ORGANISM FROM A SINGLE SET OF BLOOD CULTURES WHEN MULTIPLE SETS ARE DRAWN IS UNCERTAIN. PLEASE NOTIFY THE MICROBIOLOGY DEPARTMENT WITHIN ONE WEEK IF SPECIATION AND SENSITIVITIES ARE REQUIRED. Performed at [facility], [place] [address], [place], [place] [address]    Report Status 08/03/2018 FINAL  Final  Blood Culture ID Panel (Reflexed)     Status: Abnormal   Collection Time: 08/01/18  6:49 PM  Result Value Ref Range Status   Enterococcus species NOT DETECTED NOT DETECTED Final   Listeria monocytogenes NOT DETECTED NOT DETECTED Final   Staphylococcus species DETECTED (A) NOT DETECTED Final    Comment: Methicillin (oxacillin) resistant coagulase negative  staphylococcus. Possible blood culture contaminant (unless isolated from more than one blood culture draw or clinical case suggests pathogenicity). No antibiotic treatment is indicated for blood  culture contaminants. CRITICAL RESULT CALLED TO, READ BACK BY AND VERIFIED WITH: PHARMD WALFORD, D 1743 [id number] FCP    Staphylococcus aureus (BCID) NOT DETECTED NOT DETECTED Final   Methicillin resistance DETECTED (A) NOT DETECTED Final    Comment: CRITICAL RESULT CALLED TO, READ BACK BY AND VERIFIED WITH: PHARMD WALFORD, D 1743 [id number] FCP    Streptococcus species NOT DETECTED NOT DETECTED Final   Streptococcus agalactiae NOT DETECTED NOT DETECTED Final   Streptococcus pneumoniae NOT DETECTED NOT DETECTED Final  Streptococcus pyogenes NOT DETECTED NOT DETECTED Final   Acinetobacter baumannii NOT DETECTED NOT DETECTED Final   Enterobacteriaceae species NOT DETECTED NOT DETECTED Final   Enterobacter cloacae complex NOT DETECTED NOT DETECTED Final   Escherichia coli NOT DETECTED NOT DETECTED Final   Klebsiella oxytoca NOT DETECTED NOT DETECTED Final   Klebsiella pneumoniae NOT DETECTED NOT DETECTED Final   Proteus species NOT DETECTED NOT DETECTED Final   Serratia marcescens NOT DETECTED NOT DETECTED Final   Haemophilus influenzae NOT DETECTED NOT DETECTED Final   Neisseria meningitidis NOT DETECTED NOT DETECTED Final   Pseudomonas aeruginosa NOT DETECTED NOT DETECTED Final   Candida albicans NOT DETECTED NOT DETECTED Final   Candida glabrata NOT DETECTED NOT DETECTED Final   Candida krusei NOT DETECTED NOT DETECTED Final   Candida parapsilosis NOT DETECTED NOT DETECTED Final   Candida tropicalis NOT DETECTED NOT DETECTED Final    Comment: Performed at [facility], [place] [address] [place], [place] [address]  MRSA PCR Screening     Status: None   Collection Time: 08/03/18 11:45 PM   Specimen: Nasal Mucosa; Nasopharyngeal  Result Value Ref Range Status   MRSA by PCR NEGATIVE NEGATIVE  Final    Comment:        The GeneXpert MRSA Assay (FDA approved for NASAL specimens only), is one component of a comprehensive MRSA colonization surveillance program. It is not intended to diagnose MRSA infection nor to guide or monitor treatment for MRSA infections. Performed at [facility], [place] [address], [place], [place] [address]   Culture, Urine     Status: Abnormal (Preliminary result)   Collection Time: 08/05/18 10:03 AM   Specimen: Urine, Clean Catch  Result Value Ref Range Status   Specimen Description   Final    URINE, CLEAN CATCH Performed at [facility], [place] [address], [place], [place] [address]    Special Requests   Final    NONE Performed at [facility], [place] [address] [place], [place] [address]    Culture (A)  Final    10,000 COLONIES/mL ENTEROCOCCUS FAECALIS SUSCEPTIBILITIES TO FOLLOW Performed at [facility], [place] [address] [place], [place] [address]    Report Status PENDING  Incomplete       Radiology Studies: Dg Chest Port 1 View  Result Date: 08/05/2018 CLINICAL DATA:  Respiratory failure.  Hypoxia. EXAM: PORTABLE CHEST 1 VIEW COMPARISON:  08/04/2018.  [place] 2020 FINDINGS: Left subclavian line in stable position. Cardiomegaly with mild pulmonary venous congestion. Mild bilateral interstitial prominence. Mild left base infiltrate remains. Tiny left pleural effusion cannot be excluded. No pneumothorax. Degenerative change thoracic spine. IMPRESSION: 1.  Left subclavian line stable position. 2. Cardiomegaly with mild pulmonary venous congestion and mild bilateral interstitial prominence. Tiny left pleural effusion cannot be excluded. Mild CHF cannot be excluded. 3.  Mild left base infiltrate remains.  No change. Electronically Signed   By: Valier   On: 08/05/2018 05:47      Scheduled Meds: . sodium chloride   Intravenous Once  . camphor-menthol   Topical  BID  . Chlorhexidine Gluconate Cloth  6 each Topical Daily  . hydrocortisone sod succinate (SOLU-CORTEF) inj  50 mg Intravenous Q6H  . Immune Globulin 10%  400 mg/kg Intravenous Q24 Hr x 5  . insulin aspart  0-9 Units Subcutaneous Q4H  . mouth rinse  15 mL Mouth Rinse BID  . sodium chloride flush  10-40 mL Intracatheter  Q12H   Continuous Infusions: .  prismasol BGK 4/2.5    .  prismasol BGK 4/2.5    . heparin 1,100 Units/hr (08/06/18 1400)  . prismasol BGK 4/2.5    .  sodium bicarbonate  infusion 1000 mL 35 mL/hr at 08/05/18 1735  . TPN ADULT (ION) 90 mL/hr at 08/06/18 1400  . TPN ADULT (ION)       LOS: 7 days    Time spent: 60 minutes   Dessa Phi, DO [facility] [email or web address] 08/06/2018, 2:38 PM

## 2018-08-06 NOTE — Progress Notes (Signed)
ANTICOAGULATION CONSULT NOTE - follow up Pharmacy Consult for Lovenox>>>UFH Indication: pulmonary embolus  Allergies  Allergen Reactions  . Cephalexin Nausea Only  . Feldene [Piroxicam] Hives, Nausea Only and Other (See Comments)    Makes pt feel loopy    Patient Measurements: Height: 5\' 10"  (177.8 cm) Weight: 220 lb 10.9 oz (100.1 kg) IBW/kg (Calculated) : 73   Vital Signs: Temp: 97.6 F (36.4 C) (07/01 0400) Temp Source: Axillary (07/01 0400) BP: 117/53 (07/01 0900) Pulse Rate: 104 (07/01 0900)  Labs: Recent Labs    08/04/18 0307 08/04/18 0858  08/05/18 0210  08/05/18 1415 08/05/18 1510 08/05/18 2345 08/06/18 0600 08/06/18 0830  HGB 8.0*  --    < > 7.0*  --   --  6.8*  --  7.9*  --   HCT 27.6*  --    < > 23.6*  --   --  22.2*  --  25.6*  --   PLT 12*  --   --  16*  --   --  22*  --  21*  --   LABPROT  --  22.4*  --  22.6*  --   --   --   --   --   --   INR  --  2.0*  --  2.0*  --   --   --   --   --   --   HEPARINUNFRC  --   --   --   --    < > 0.26*  --  0.31  --  0.30  CREATININE 1.67*  --   --  1.93*  --   --   --   --  2.35*  --    < > = values in this interval not displayed.    Assessment: 41 yoM [facility] now with weakness and lethargy. On PTA xarelto for recent PE. Switching to Lovenox per Hematology recommendations with Pharmacy to dose.   6/29 LMWH changed to UFH due to severe thrombocytopenia and AKI   08/06/2018   CBC: Hgb 7.9; Plt 21   HIT Ab negative Lupus anticoag negative  given platelets by heme 6/28 for low platelets  IVIG started 6/28 x 5 days by heme for low platelets  SCr: 2.35 , CrCl ~ 30.7 ml/min  Previous anticoagulation: Xarelto 15 mg bid, LD 6/24 AM  Confirmatory heparin level is 0.3 (low end of goal) on heparin at 1050 units/hr  No active bleeding reported   Goal of Therapy: Heparin level 0.3-0.7 units/ml Monitor platelets by anticoagulation protocol: Yes  Plan:  Increase heparin drip to 1100  units/hr Daily CBC and heparin level  Eudelia Bunch, Pharm.D 08/06/2018 10:03 AM

## 2018-08-06 NOTE — Progress Notes (Deleted)
RN Mariann Laster wasted 15ml of ativan with in stericycle, RN Rayfield Beem because pt. Could not receive it due to IV access and it was already pulled up in syringe.

## 2018-08-06 NOTE — Progress Notes (Addendum)
HEMATOLOGY-ONCOLOGY PROGRESS NOTE  SUBJECTIVE: The patient is sedated.  He did not really respond to me today.  Nursing has not noticed any bleeding.  He remains on heparin.  REVIEW OF SYSTEMS:   Not obtained because of confusion  PHYSICAL EXAMINATION: ECOG PERFORMANCE STATUS: 4 - Bedbound  Vitals:   08/06/18 1200 08/06/18 1235  BP: (!) 93/43 (!) 120/43  Pulse: (!) 102 (!) 102  Resp: 18 (!) 24  Temp: 98 F (36.7 C)   SpO2: 92% (!) 89%   Filed Weights   08/04/18 1011 08/05/18 0305 08/06/18 0500  Weight: 212 lb 4.9 oz (96.3 kg) 213 lb 13.5 oz (97 kg) 220 lb 10.9 oz (100.1 kg)   Exam:  GENERAL: Sedated.  Does not open eyes when I speak to him. EYES: Scleral icterus noted.  LUNGS:  Diminished in the bases. HEART: RRR ABDOMEN:abdomen soft, non-tender and normal bowel sounds Extremities: Generalized 1-2+ edema Skin: Multiple ecchymotic areas over his arms and legs. Petechial rash to neck and chest.   LABORATORY DATA:  I have reviewed the data as listed CMP Latest Ref Rng & Units 08/06/2018 08/05/2018 08/04/2018  Glucose 70 - 99 mg/dL 204(H) 168(H) 128(H)  BUN 8 - 23 mg/dL 114(H) 80(H) 70(H)  Creatinine 0.61 - 1.24 mg/dL 2.35(H) 1.93(H) 1.67(H)  Sodium 135 - 145 mmol/L 141 142 140  Potassium 3.5 - 5.1 mmol/L 4.3 4.1 3.7  Chloride 98 - 111 mmol/L 114(H) 115(H) 116(H)  CO2 22 - 32 mmol/L 21(L) 19(L) 14(L)  Calcium 8.9 - 10.3 mg/dL 7.4(L) 6.9(L) 6.8(L)  Total Protein 6.5 - 8.1 g/dL 4.8(L) 4.9(L) 4.5(L)  Total Bilirubin 0.3 - 1.2 mg/dL 6.3(H) 6.0(H) 5.6(H)  Alkaline Phos 38 - 126 U/L 103 110 127(H)  AST 15 - 41 U/L 131(H) 217(H) 482(H)  ALT 0 - 44 U/L 47(H) 82(H) 140(H)    Lab Results  Component Value Date   WBC 17.6 (H) 08/06/2018   HGB 7.9 (L) 08/06/2018   HCT 25.6 (L) 08/06/2018   MCV 91.1 08/06/2018   PLT 21 (LL) 08/06/2018   NEUTROABS 5.8 08/05/2018    ASSESSMENT AND PLAN: 1. Worsening thrombocytopenia: HIT panel negative. ADAMTS13 pending. No evidence of  schistocytes on the smear, no evidence of TTP. He has pan bone marrow suppression as well as increased consumption of platelets. Unfortunately supportive care is all that we are able to offer for him. Prognosis is poor. Palliative care is following. Will give IVIG and platelets today.  2.  Elevated LFTS: suspected to be due to hypo-perfusion of liver.  LFTs trending downward, total bilirubin is rising.   3. Anemia and Leucocytosis: due to acute illness; Transfuse PRBC for Hemoglobin < 7.0 or active bleeding.  4. DVT and PE: On IV heparin. Recommend continuation of heparin despite low platelet count due to risk of infarct. Will continue to transfuse platelets.   Mikey Bussing, DNP, AGPCNP-BC, AOCNP  Attending Note  I personally saw the patient, reviewed the chart and examined the patient.  Persistent and worsening thrombocytopenia: Continue with supportive care with platelet transfusions, IVIG is continuing. Anemia and leukocytosis: Transfuse packed red cells. DVT and PE: IV heparin

## 2018-08-06 NOTE — Progress Notes (Signed)
[facility] NOTE   Pharmacy Consult for TPN Indication: critically ill - altered mental status, elevated INR and low platelets preclude enteral tube feed placement  Patient Measurements: Height: 5\' 10"  (177.8 cm) Weight: 220 lb 10.9 oz (100.1 kg) IBW/kg (Calculated) : 73 TPN AdjBW (KG): 82.1 Body mass index is 31.66 kg/m. Usual Weight:   Current Nutrition: NPO, TPN  IVF: D545NS 1 amp bicarb at 58 ml/hr  Central access: has triple lumen CVC placed 08/02/18 TPN start date: 08/04/18  ASSESSMENT                                                                                                          HPI: 78 year-old male with medical history significant for recently diagnosed pulmonary embolus and BLE DVTs on Xarelto, HTN, hyperlipidemia, depression, and hard of hearing. He presented to the ED for evaluation of generalized weakness, confusion, abdominal distention. Patient was recently admitted from 6/12-6/16 for a L segmental pulmonary embolus and BLE DVTs. He was discharged on home supplemental oxygen due to persistent hypoxia while admitted. He was re-admitted 6/21-6/23 for generalized weakness/confusion and lethargy. Repeat CTA chest showed persistent known PE. He returned to the ED on 6/24 per PCP recommendation.  Significant events: critically ill with MSOF, off pressors.   Today:    Glucose: 12 U SSI/24 hrs, on solucortef 50 IV q6h, CBGs 141 - 198 after TPN started incr to 90 ml/hr with 20 units insulin/bag  Electrolytes: WNL, CoCa 9.64  Renal:AKI, Scr 2.35, BUN up to 114;  I/O + 2306, UOP 450/24  hrs  LFTs: elevated LFTs, GI "may have originally started with passive hepatic congestion/right heart strain from pulmonary embolism, now appears to be largely due to systemic illness and ischemic hepatopathy.  Transaminases are improving, but the bilirubin is expected to rise over the next few days, as is typical with this scenario"  TGs: 174  6/30  Prealbumin: < 5 on 6/26 & 6/30, probably due to critical illness  NUTRITIONAL GOALS                                                                                             RD recs: 08/03/18 Kcal:  1950-9326 kcal Protein:  102-120 grams Fluid:  >/= 2.1 L/day  Custom TPN at goal rate of 90 ml/hr provides: -  118.8 g/day protein (55 g/L) - 64..8 g/day Lipid   (30 g/L) - 324  g/day Dextrose (15 %) -  2225 Kcal/day  PLAN  At 1800 today:  continue TPN at goal rate of 90 ml/hr - add lipids today  Electrolytes in TPN: Standard  Cl:Ac ratio max acetate  TPN to contain standard multivitamins and trace elements on MWF only due to shortage  IVF at 35 ml/hr to keep total IVF 125 or per MD  Continue 20 units insulin in TPN; Continue current SSI .   TPN lab panels on Mondays & Thursdays.  F/u daily.  Eudelia Bunch, Pharm.D 08/06/2018 8:20 AM

## 2018-08-06 DEATH — deceased

## 2018-08-07 ENCOUNTER — Other Ambulatory Visit: Payer: Self-pay | Admitting: Emergency Medicine

## 2018-08-07 ENCOUNTER — Other Ambulatory Visit: Payer: Self-pay | Admitting: *Deleted

## 2018-08-07 ENCOUNTER — Inpatient Hospital Stay (HOSPITAL_COMMUNITY): Payer: Medicare Other

## 2018-08-07 ENCOUNTER — Encounter: Payer: Self-pay | Admitting: *Deleted

## 2018-08-07 DIAGNOSIS — C8510 Unspecified B-cell lymphoma, unspecified site: Secondary | ICD-10-CM | POA: Diagnosis not present

## 2018-08-07 LAB — URINE CULTURE: Culture: 10000 — AB

## 2018-08-07 LAB — BASIC METABOLIC PANEL
Anion gap: 11 (ref 5–15)
BUN: 109 mg/dL — ABNORMAL HIGH (ref 8–23)
CO2: 19 mmol/L — ABNORMAL LOW (ref 22–32)
Calcium: 7.4 mg/dL — ABNORMAL LOW (ref 8.9–10.3)
Chloride: 111 mmol/L (ref 98–111)
Creatinine, Ser: 2.92 mg/dL — ABNORMAL HIGH (ref 0.61–1.24)
GFR calc Af Amer: 23 mL/min — ABNORMAL LOW (ref >60)
GFR calc non Af Amer: 20 mL/min — ABNORMAL LOW (ref >60)
Glucose, Bld: 187 mg/dL — ABNORMAL HIGH (ref 70–99)
Potassium: 5.4 mmol/L — ABNORMAL HIGH (ref 3.5–5.1)
Sodium: 141 mmol/L (ref 135–145)

## 2018-08-07 LAB — BLOOD GAS, ARTERIAL
Acid-base deficit: 12.9 mmol/L — ABNORMAL HIGH (ref 0.0–2.0)
Acid-base deficit: 13.7 mmol/L — ABNORMAL HIGH (ref 0.0–2.0)
Bicarbonate: 13.3 mmol/L — ABNORMAL LOW (ref 20.0–28.0)
Bicarbonate: 15.5 mmol/L — ABNORMAL LOW (ref 20.0–28.0)
Drawn by: 225631
Drawn by: 225631
FIO2: 100
FIO2: 90
MECHVT: 0.54 mL
MECHVT: 0.54 mL
O2 Saturation: 91 %
O2 Saturation: 95.2 %
PEEP: 5 cmH2O
PEEP: 5 cmH2O
Patient temperature: 97.9
Patient temperature: 98.2
RATE: 18 resp/min
RATE: 26 resp/min
pCO2 arterial: 37.6 mmHg (ref 32.0–48.0)
pCO2 arterial: 50.2 mmHg — ABNORMAL HIGH (ref 32.0–48.0)
pH, Arterial: 7.115 — CL (ref 7.350–7.450)
pH, Arterial: 7.173 — CL (ref 7.350–7.450)
pO2, Arterial: 74.3 mmHg — ABNORMAL LOW (ref 83.0–108.0)
pO2, Arterial: 87.5 mmHg (ref 83.0–108.0)

## 2018-08-07 LAB — BPAM PLATELET PHERESIS
Blood Product Expiration Date: 202007032359
Blood Product Expiration Date: 202007032359
Blood Product Expiration Date: 202007042359
Blood Product Expiration Date: 202007042359
ISSUE DATE / TIME: 202007010121
ISSUE DATE / TIME: 202007011303
Unit Type and Rh: 6200
Unit Type and Rh: 7300
Unit Type and Rh: 6200
Unit Type and Rh: 6200

## 2018-08-07 LAB — PREPARE PLATELET PHERESIS
Unit division: 0
Unit division: 0
Unit division: 0
Unit division: 0

## 2018-08-07 LAB — HAPTOGLOBIN: Haptoglobin: <10 mg/dL — ABNORMAL LOW (ref 34–355)

## 2018-08-07 LAB — LACTIC ACID, PLASMA: Lactic Acid, Venous: 7.1 mmol/L (ref 0.5–1.9)

## 2018-08-07 MED ORDER — SODIUM CHLORIDE 0.9 % IV SOLN
8.0000 mg/h | INTRAVENOUS | Status: DC
  Filled 2018-08-07: qty 80

## 2018-08-07 MED ORDER — SODIUM CHLORIDE 0.9 % IV SOLN
80.0000 mg | Freq: Once | INTRAVENOUS | Status: DC
  Filled 2018-08-07: qty 80

## 2018-08-07 MED ORDER — GLYCOPYRROLATE 0.2 MG/ML IJ SOLN: 0.2000 mg | INTRAMUSCULAR | Status: DC | PRN

## 2018-08-07 MED ORDER — SODIUM BICARBONATE 8.4 % IV SOLN
50.0000 meq | Freq: Once | INTRAVENOUS | Status: DC
  Filled 2018-08-07: qty 50

## 2018-08-07 MED ORDER — GLYCOPYRROLATE 1 MG PO TABS: 1.0000 mg | ORAL_TABLET | ORAL | Status: DC | PRN

## 2018-08-07 MED ORDER — VANCOMYCIN HCL 10 G IV SOLR
2000.0000 mg | Freq: Once | INTRAVENOUS | Status: AC
  Administered 2018-08-07: 2000 mg via INTRAVENOUS
  Filled 2018-08-07: qty 2000

## 2018-08-07 MED ORDER — FENTANYL 2500MCG IN NS 250ML (10MCG/ML) PREMIX INFUSION: 0.0000 ug/h | INTRAVENOUS | Status: DC

## 2018-08-07 MED ORDER — AMIODARONE HCL IN DEXTROSE 360-4.14 MG/200ML-% IV SOLN
60.0000 mg/h | INTRAVENOUS | Status: DC
  Administered 2018-08-07: 60 mg/h via INTRAVENOUS

## 2018-08-07 MED ORDER — MIDAZOLAM HCL 2 MG/2ML IJ SOLN
1.0000 mg | INTRAMUSCULAR | Status: DC | PRN
  Administered 2018-08-07: 03:00:00 1 mg via INTRAVENOUS
  Filled 2018-08-07 (×2): qty 2

## 2018-08-07 MED ORDER — SODIUM CHLORIDE 0.9 % IV SOLN
2.0000 g | Freq: Two times a day (BID) | INTRAVENOUS | Status: DC
  Filled 2018-08-07 (×2): qty 2

## 2018-08-07 MED ORDER — AMIODARONE IV BOLUS ONLY 150 MG/100ML
150.0000 mg | Freq: Once | INTRAVENOUS | Status: AC
  Administered 2018-08-07: 150 mg via INTRAVENOUS

## 2018-08-07 MED ORDER — AMIODARONE HCL IN DEXTROSE 360-4.14 MG/200ML-% IV SOLN: 30.0000 mg/h | INTRAVENOUS | Status: DC

## 2018-08-07 MED ORDER — SODIUM CHLORIDE 0.9% IV SOLUTION: Freq: Once | INTRAVENOUS | Status: DC

## 2018-08-07 MED ORDER — MORPHINE SULFATE (PF) 2 MG/ML IV SOLN
2.0000 mg | INTRAVENOUS | Status: DC | PRN
  Administered 2018-08-07 (×2): 2 mg via INTRAVENOUS
  Filled 2018-08-07 (×2): qty 1

## 2018-08-07 MED ORDER — DIPHENHYDRAMINE HCL 50 MG/ML IJ SOLN: 25.0000 mg | INTRAMUSCULAR | Status: DC | PRN

## 2018-08-07 MED ORDER — POLYVINYL ALCOHOL 1.4 % OP SOLN
1.0000 [drp] | Freq: Four times a day (QID) | OPHTHALMIC | Status: DC | PRN
  Filled 2018-08-07: qty 15

## 2018-08-08 LAB — BPAM RBC
Blood Product Expiration Date: 202007162359
Blood Product Expiration Date: 202007162359
ISSUE DATE / TIME: 202006302051
Unit Type and Rh: 600
Unit Type and Rh: 600

## 2018-08-08 LAB — TYPE AND SCREEN
ABO/RH(D): A NEG
Antibody Screen: NEGATIVE
Unit division: 0
Unit division: 0

## 2018-08-08 LAB — ADAMTS13 ACTIVITY: Adamts 13 Activity: 15.4 % — CL (ref >66.8)

## 2018-08-08 LAB — ADAMTS13 ANTIBODY: ADAMTS13 Antibody: 9 Units/mL (ref <12)

## 2018-08-10 LAB — PROTEIN ELECTROPHORESIS, SERUM
A/G Ratio: 0.4 — ABNORMAL LOW (ref 0.7–1.7)
Albumin ELP: 1.3 g/dL — ABNORMAL LOW (ref 2.9–4.4)
Alpha-1-Globulin: 0.2 g/dL (ref 0.0–0.4)
Alpha-2-Globulin: 0.3 g/dL — ABNORMAL LOW (ref 0.4–1.0)
Beta Globulin: 0.6 g/dL — ABNORMAL LOW (ref 0.7–1.3)
Gamma Globulin: 2.2 g/dL — ABNORMAL HIGH (ref 0.4–1.8)
Globulin, Total: 3.3 g/dL (ref 2.2–3.9)
M-Spike, %: 0.9 g/dL — ABNORMAL HIGH
Total Protein ELP: 4.6 g/dL — ABNORMAL LOW (ref 6.0–8.5)

## 2018-08-11 ENCOUNTER — Telehealth: Payer: Self-pay

## 2018-08-11 ENCOUNTER — Ambulatory Visit: Payer: Self-pay | Admitting: *Deleted

## 2018-08-11 NOTE — Telephone Encounter (Signed)
Received dc from [employer].. DC is for cremation and a patient of Doctor Byrum.   DC will be taken to Pulmonary Unit for signature.  On 08/13/2018 Received signed dc back from Doctor Wert. I called the funeral home to let them know the dc is ready for pickup and also faxed a copy to the funeral home.

## 2018-08-22 DIAGNOSIS — R591 Generalized enlarged lymph nodes: Secondary | ICD-10-CM | POA: Diagnosis present

## 2018-08-22 DIAGNOSIS — R161 Splenomegaly, not elsewhere classified: Secondary | ICD-10-CM | POA: Diagnosis present

## 2018-09-06 NOTE — Patient Outreach (Signed)
[facility]) Care Management  2018-08-21  Luis Cisneros [birth year] [id number]   Referral received to follow up with member post discharge.  Notified that member passed away during hospitalization.  Will make member "not active" due to being deceased.  Valente David, [place], MSN [place] [phone]

## 2018-09-06 NOTE — Progress Notes (Signed)
Notified ELink of patient's heart rate jumping into the 150's and then returning to normal sinus rhythm, then sustaining in the 140's for about 10 seconds.   Also, patient's BP has suddenly become very soft.  ELink notified. This RN and Respiratory Therapy at bedside.  Dr. Lucile Shutters on camera and providing orders.  Will follow orders and continue to closely monitor patient.  Mariann Laster RN

## 2018-09-06 NOTE — Progress Notes (Signed)
Pt's oxygen saturation sustaining 88-89% on 6L [place]. Pt appears to have increased tachypnea and work of breathing. Turned oxygen to 8L [place]. Notified ELink as well as Respiratory Therapy.  Awaiting orders from [facility].  Will continue to closely monitor patient.  Mariann Laster RN

## 2018-09-06 NOTE — Progress Notes (Signed)
PULMONARY / CRITICAL CARE MEDICINE   NAME:  KYM FENTER, MRN:  [id number], DOB:  01-11-1941, LOS: 8 ADMISSION DATE:  07/22/2018, CONSULTATION DATE:  08/01/2018 REFERRING MD:  Lonny Prude / [place], CHIEF COMPLAINT:  Weakness   BRIEF HISTORY:    78 y/o M, former smoker, admitted 6/24 per [facility] with SOB and fatigue.    He has a prior history of PE (2006).  The patient has had 3 admits in the last 6 months - 6/12 to 6/16 and 6/21 to 6/23 for weakness, confusion, fever and failure to thrive. 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  ECHO at that time was negative for RV strain. Hypercoaguable work-up was notable for decreased protein S and protein C.   Re-admitted 6/21 with weakness, confusion and concern for failure to thrive.  Work up found elevated LFT's and worsening thrombocytopenia. HIT antibody negative.  Acute hepatitis panel negative.  T. Bili 3.9.  Abd US showed gallbladder sludge, no stones, CBD 46mm, hepatic steatosis vs hepatocellular disease, 2.2 cm left lobe cyst, patent portal vein. CT ABD/Pelvis 6/24 showed progressive splenomegaly suspicious for splenic infarcts & a 35mm proximal left ureter stone without hydronephrosis.     He returned 6/24 from his PCP for increased weakness and altered mental status. He reportedly fell while trying to get out of the car on 6/23.  Wife reported increased swelling in LE's, constipation, difficulty initiating urination, confusion, intermittent fevers and poor appetite. Additionally, she has noted some spotting of blood on tissue when patient had BM but no other bleeding.  Wife reports  18 lb weight loss since January. 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  She believes this fall is the source of his splenic infarct and decline.    SIGNIFICANT PAST MEDICAL HISTORY   HTN, hyperlipidemia, depression, hypercoagulability / DVT (2006, 2020)  SIGNIFICANT EVENTS:  6/24 Admit with weakness,  fatigue / AMS 6/27 hypotensive, no response to fluids/ alb bolus > neo then levophed/versed and stress HC 6/28 AKI, worsening LFT's, AMS 7/1 progressive renal failure, multifactorial metabolic encephalopathy  STUDIES:   Liver Doppler 6/21 >> patent flow of portal vein CT ABD 6/24 >> progressive splenomegaly with wedge-shaped areas of decreased attenuation peripherally suspicious for subacute splenic infarcts, unchanged position of left ureteral calculus, no significant hydronephrosis, interval development of diffuse soft tissue edema, ascites and bilateral pleural effusions consistent with anasarca.  CT ABD/Pelvis 6/24 >> negative  ECHO 6/26 >> unremarkable with RAP est 10 mmhg and nl RV /LV fxn, no septal defect  CULTURES:  [facility] 07/26/2018 >> 1/2 micrococcus Luteus / Lylae  BCID 6/26 >> coag neg staph COVID 6/24 >> negative  MRSA 6/24 >> negative  BCx2 6/26 >> coag neg staph 1/2  BCx2 6/26 >> negative CMV IgM 6/29 >> negative  UA 6/30 >> mod leukocytes, 30 protein, RBC >50, WBC >50  AUTOIMMUNE:  ADAMS13 6/29 >>  Coombs 6/29 >> negative  ANA 6/29 >> negative Anti-DNA 6/29 >> no valid result obtained  CCP 6/29 >> 20 (upper limit normal 19) RA 6/29 >> negative ANCA 6/29 >> negative Sed Rate 6/29 >> 9  C3 6/29 >> 56  C4 6/30 >> 28 Anti GBM 6/29 >> IgG 6/30 >> 1,979 (has received IVIG)  ANTIBIOTICS:  Rafixamin 6/24 > 6/25 Primaxin 6/27 >> 6/29 Vanc 6/26 >> 6/29 Cefepime 6/29 >> 6/30  LINES/TUBES:  L radial Art line 6/27 >>  out L [place] CVL 6/27 >> ETT 7/2 >>    CONSULTANTS:  Hematology GI   Cardiology 6/26  PCCM 6/26  Nephrology 6/30   SUBJECTIVE:  Overnight events noted.  Patient continued to have progression of his encephalopathy, respiratory suppression, poor airway protection and hypoxemia.  Presumed progressive acidosis as well.  He required intubation for airway protection and hypoxemic respiratory failure early this morning. His family was contacted and able to come  into the hospital to see him.  He has been transitioned over to a comfort based approach.  CONSTITUTIONAL: BP 114/64 (BP Location: Left Arm)   Pulse 81   Temp 98.2 F (36.8 C) (Axillary)   Resp 17   Ht 5\' 10"  (1.778 m)   Wt 100.1 kg   SpO2 97%   BMI 31.66 kg/m      Intake/Output Summary (Last 24 hours) at 2018-08-21 0750 Last data filed at 21-Aug-2018 0500 Gross per 24 hour  Intake 4827.03 ml  Output -  Net 4827.03 ml    CVP:  [6 mmHg-11 mmHg] 11 mmHg  Vent Mode: PRVC FiO2 (%):  [90 %-100 %] 90 % Set Rate:  [16 bmp-26 bmp] 16 bmp Vt Set:  [540 mL] 540 mL PEEP:  [5 cmH20] 5 cmH20 Plateau Pressure:  [23 cmH20-25 cmH20] 25 cmH20 PHYSICAL EXAM: General: Critically ill but comfortable, poorly responsive intubated HEENT: ET tube in place Neuro: Sedated, completely unresponsive CV: Regular, distant, no murmur PULM: Coarse bilaterally GI: Mildly distended, unable to assess tenderness, no bowel sounds heard Extremities: 2-3+ pitting edema to the thighs Skin: Petechiae, no overt rash  RESOLVED PROBLEM LIST    ASSESSMENT AND PLAN    Altered Mental Status, multifactorial metabolic encephalopathy P: Not clear that this is reversible but it ordered accomplish likely need to try to reverse his underlying metabolic disarray, discussed renal replacement with his family on 7/1 Based on events overnight, discussion with family this morning we will not lighten sedating medications, plan to concentrate instead on comfort  Fever Shock with MODS -blood cultures concerning for contaminant, only hardware is left hip which is remote -NOTE BLOOD CULTURES 1/4 with coag neg staph -possible GI source ?, TTP / HUS / MHA (less likely), malignancy  P:  Antibiotics discontinued on 6/30, following clinically He remained on stress dose steroids  Elevated LFT's  Hyperammoniemia  Suspected Shock Liver  Severe Thrombocytopenia  -elevated LDH, enlarged spleen on CT, negative schistocytes on DIC  panel, HIT negative, acute hepatitis panel negative, GB sludge but no stones, patent portal vein, HIV negative, EBV negative, GGT 83.  -Protein C deficiency noted on prior labs  -? Myeloproliferative disorder, hematologic malignancy, TTP (less likely), underlying unknown liver disease? Normocytic Anemia  P: Etiology for his original hepatic disfunction unclear, I do believe that he experienced some degree of shock liver superimposed on his primary process on 6/26 Received steroids, IVIG   AKI  -urine Na <10, consistent with pre-renal failure / ATN -received contrast x2, hypotensive episodes and abx (possible interstitial nephritis) AG Metabolic Acidosis  -progressive lactic acidosis with hypotension, worsening renal function. Thrombocytopenia & splenomegaly not likely due to passive congestion based on ECHO findings 6/26  P: Discussed CVVH, given his acute decline overnight this has been deferred ADAMS13 still pending.  Acute Hypoxic Respiratory Failure  -secondary to PE with concern for infarct of LLL  Pulmonary Embolism (L), DVT  P: O2 for sats >90% Heparin gtt   Multiple Pulmonary Nodules P:  No intervention to be made  at this time.  No clear evidence that this has contributed to his presentation illness  Moderate to Severe Protein Calorie Malnutrition  P: Was receiving TPN, now discontinued   Family Discussion: Discussed patient's status 7/2 at bedside, the changes overnight, his current support with his wife, granddaughter, daughter at bedside.  They have transitioned over to a comfort based approach based on the entire clinical picture.  I support this and support them.  We will do everything in our power to ensure his comfort and dignity.  I did briefly mention the possibility of a postmortem examination given the remaining uncertainty regarding his underlying diagnosis.  LABS   Glucose Recent Labs  Lab 08/06/18 0343 08/06/18 0735 08/06/18 1149 08/06/18 1545  08/06/18 1928 08/06/18 2343  GLUCAP 198* 163* 167* 164* 178* 184*    BMET Recent Labs  Lab 08/06/18 0600 08/06/18 1600 08/06/18 2326  NA 141 141 141  K 4.3 4.9 5.4*  CL 114* 114* 111  CO2 21* 19* 19*  BUN 114* 111* 109*  CREATININE 2.35* 2.71* 2.92*  GLUCOSE 204* 194* 187*    Liver Enzymes Recent Labs  Lab 08/04/18 0307 08/05/18 0210 08/06/18 0600 08/06/18 1600  AST 482* 217* 131*  --   ALT 140* 82* 47*  --   ALKPHOS 127* 110 103  --   BILITOT 5.6* 6.0* 6.3*  --   ALBUMIN 1.4* 1.3* 1.2* 1.1*    Electrolytes Recent Labs  Lab 08/05/18 0210 08/06/18 0600 08/06/18 1600 08/06/18 2326  CALCIUM 6.9* 7.4* 7.6* 7.4*  MG 2.4 2.3  --   --   PHOS 3.0 3.9 4.4  --     CBC Recent Labs  Lab 08/05/18 0210 08/05/18 1510 08/06/18 0600  WBC 13.8* 14.1* 17.6*  HGB 7.0* 6.8* 7.9*  HCT 23.6* 22.2* 25.6*  PLT 16* 22* 21*    ABG Recent Labs  Lab 08/06/18 2210 Aug 23, 2018 0035 08-23-2018 0230  PHART 7.091* 7.115* 7.173*  PCO2ART 59.5* 50.2* 37.6  PO2ART 64.6* 74.3* 87.5    Coag's Recent Labs  Lab 08/01/18 1035 08/04/18 0858 08/05/18 0210  APTT 61*  --   --   INR 1.7* 2.0* 2.0*    Sepsis Markers Recent Labs  Lab 08/02/18 0919 08/02/18 1040 08/03/18 0508 08/04/18 0307 08/06/18 1217 08/06/18 2325  LATICACIDVEN  --  8.6*  --   --  3.9* 7.1*  PROCALCITON 7.58  --  8.20 7.36  --   --     Baltazar Apo, MD, PhD 08/23/18, 8:10 AM [facility] [phone] or if no answer [phone]

## 2018-09-06 NOTE — Progress Notes (Signed)
Followed up w/family. Pt's wife was alone w/pt playing music and singing to him. She told me it was the song they played for their wedding ("You Will Be My Light"). She talked more about her faith and one of her favorite preachers. Pt's wife spoke of how it will be difficult living without him. Pt's wife said he has been sick for about a month and here since the 12th. She said they never figured out what was wrong with him.  Her daughter and granddaughter joined [place] moments later. I offered continued support and left to allow them to visit. Please page if additional support is needed. [place], MDiv   08-10-2018 0400  Clinical Encounter Type  Visited With Family

## 2018-09-06 NOTE — Procedures (Signed)
Intubation Procedure Note Luis Cisneros [id number] [birth year]  Procedure: Intubation Indications: Airway protection and maintenance  Procedure Details Consent: Unable to obtain consent because of emergent medical necessity. Time Out: Verified patient identification, verified procedure, site/side was marked, verified correct patient position, special equipment/implants available, medications/allergies/relevent history reviewed, required imaging and test results available.  Performed  Maximum sterile technique was used including cap, gloves, hand hygiene and mask.  MAC and 3    Evaluation Hemodynamic Status: BP stable throughout; O2 sats: stable throughout Patient's Current Condition: stable Complications: No apparent complications Patient did tolerate procedure well. Chest X-ray ordered to verify placement.  CXR: tube position high-repostitioned.   Winferd Humphrey Aug 08, 2018

## 2018-09-06 NOTE — Progress Notes (Signed)
Pt has passed. Confirmed with two nurses listening to heart. Pt then extubated.

## 2018-09-06 NOTE — Progress Notes (Signed)
Patient passed away at 0737 with RN and family at bedside. Patient intubated at time of death and tube removed shortly after death.

## 2018-09-06 NOTE — Progress Notes (Signed)
Pt's wife Mimi told this RN that she and the rest of the family are ready to let the patient go and is ready for the pressors to be stopped.  Notified Dr. Duwayne Heck of this change.  Mariann Laster RN

## 2018-09-06 NOTE — Procedures (Signed)
Endotracheal Intubation Procedure Note  Indication for endotracheal intubation: airway compromise and respiratory failure. Airway Assessment: Mallampati Class: unable to assess due to mental status. Sedation: etomidate, fentanyl and midazolam. Paralytic: succinylcholine. Lidocaine: no. Atropine: no. Equipment: Macintosh 3 laryngoscope blade. Cricoid Pressure: no. Number of attempts: 1. ETT location confirmed by by auscultation and by CXR. To 23 cm at teeth  After moved for CXR, noticed air from mouth, low expiratory volume.  Tube advanced 3cm, no further problems.    Luis Cisneros 09-01-2018

## 2018-09-06 NOTE — Progress Notes (Signed)
Pts. E.T. tube advanced w/o incident X2 cm to 25cm just prior to repeat CXR-results pending.

## 2018-09-06 NOTE — Procedures (Signed)
Arterial Catheter Insertion Procedure Note Luis Cisneros [id number] [birth year]  Procedure: Insertion of Arterial Catheter  Indications: Blood pressure monitoring and Frequent blood sampling  Procedure Details Consent: Unable to obtain consent because of emergent medical necessity. Time Out: Verified patient identification, verified procedure, site/side was marked, verified correct patient position, special equipment/implants available, medications/allergies/relevent history reviewed, required imaging and test results available.  Performed  Maximum sterile technique was used including antiseptics, cap, gloves, gown, hand hygiene, mask and sheet. Skin prep: Chlorhexidine; local anesthetic administered 20 gauge catheter was inserted into right radial artery using the Seldinger technique. ULTRASOUND GUIDANCE USED: NO Evaluation Blood flow good; BP tracing good. Complications: No apparent complications.   Collier Bullock September 06, 2018

## 2018-09-06 NOTE — Progress Notes (Signed)
Pt. Intubated by Dr. Collier Bullock. uneventfully, X staff at bedside.

## 2018-09-06 NOTE — Progress Notes (Signed)
Pt's wife, daughter and granddaughter were bedside when I arrived. They were tearful and wanted prayer for eol decision. Mrs. Pennings said she knows it is all in God's hands. We talked about their faith for a moment. Following prayer, family was again pretty tearful and grateful for visit. Please page if additional support is needed. [place], MDiv   11-Aug-2018 0300  Clinical Encounter Type  Visited With Family

## 2018-09-06 NOTE — Patient Outreach (Signed)
[facility]) Care Management  08/09/18  Luis Cisneros [birth year] [id number]   CSW noted that patient passed away today while hospitalized.  CSW will notify patient's Primary Care Physician, Dr. Tedra Senegal of patient's demise.  Nat Christen, BSW, MSW, LCSW  Licensed Education officer, [facility]  Mailing [place] [address], [place], [place] [address] Physical Address-[address], [place], [place] [address] Toll Free Main # [phone] Fax # [phone] Cell # [phone]  Office # [phone] [email or web address]

## 2018-09-06 NOTE — Death Summary Note (Signed)
  DEATH SUMMARY   Patient Details  Name: Luis Cisneros DOB: [birth year]  Admission/Discharge Information   Admit Date:  Aug 10, 2018  Date of Death: Date of Death: 08/18/18  Time of Death: Time of Death: 0737  Length of Stay: 8  Referring Physician: Elby Showers, MD   Reason(s) for Hospitalization    Diagnoses  Preliminary cause of death:   Acute respiratory failure due to encephalopathy and inability protect airway  Secondary Diagnoses (including complications and co-morbidities):  Principal Problem:   Acute respiratory failure with hypoxemia ([place]) Active Problems:   PE (pulmonary thromboembolism) (HCC)   Depression   Generalized weakness   Hyponatremia   Thrombocytopenia (H[facility])   Encephalopathy   Elevated liver enzymes   Lactic acid acidosis   Anasarca   Elevated INR   Splenic infarct   Hypoalbuminemia   Shock circulatory (HCC)   Acute kidney injury ([place])   Normocytic anemia   Unintentional weight loss   Lymphadenopathy   Splenomegaly   Possible Acute Lymphoma   Brief Hospital Course (including significant findings, care, treatment, and services provided and events leading to death)  Luis Cisneros was a 78 y.o. year old male with a history of VTE, recently diagnosed PE and started on Xarelto.  Also history of hypertension, hyperlipidemia, depression.  He had been dealing with generalized weakness, lethargy and some confusion in the aftermath of that hospitalization.  Work-up showed transaminitis.  He was readmitted 08/10/22 with progression of the symptoms, weakness to the point of a fall, increased abdominal distention.  Work-up again revealed progression of his abnormal liver function, progressive thrombocytopenia. Hepatic imaging was unrevealing, as was autoimmune evaluation.  He evolved shock and persistent lactic acidosis, was treated empirically for sepsis with broad-spectrum antibiotics.  Experienced progressive renal failure and given this superimposed  on his thrombocytopenia decision was made to treat empirically for possible TTP with corticosteroids and IVIG.  He had no schistocytes but did have abnormal leukocytes on peripheral smear.  This finding, in combination with his thrombocytopenia and hepatic dysfunction, splenomegaly all raise question of possible hematological malignancy.  Consideration was made for possible hemodialysis.  Given his overall decline over a months time and given the patient's philosophy for his care some time consideration was taken to decide whether he would actually want dialysis.  He unfortunately experienced progressive decline in mental status, hemodynamics, respiratory status overnight 2022-08-18.  He was intubated to attempt to achieve stabilization.  His family including his wife Luis Cisneros were able to come to visit him urgently overnight.  It was felt that Luis Cisneros would not want invasive support, prolonged critical care support that would commit him to a significant decline in his quality of life.  For this reason decision was made to transition him to a comfort based care approach.  The patient expired on 18-Aug-2018 with family at bedside.  Based on the unclear etiology of his hepatic dysfunction, thrombocytopenia, overall decline, as well as the suspicion that there could be hematopoietic abnormality, I discussed with Luis Cisneros the possibility of a postmortem examination.  She agreed that there would be some value and some closure achieved if a unifying diagnosis could be determined.  I made a referral to pathology to consider postmortem exam based on our discussions.    Luis Cisneros 08/22/2018, 4:53 PM

## 2018-09-06 NOTE — Progress Notes (Signed)
This RN gave: 100 fent @ 2258 2 versed @ 2258 10 etom @ [id number] JOIN @ 2259  Pt was intubated by Dr Duwayne Heck at 2300.   Mariann Laster RN

## 2018-09-06 DEATH — deceased

## 2018-09-30 ENCOUNTER — Telehealth: Payer: Self-pay | Admitting: Emergency Medicine

## 2018-09-30 NOTE — Telephone Encounter (Signed)
Autopsy results now available. Consistent with widespread B cell lymphoma. I will contact patient's wife to let her know the results.

## 2018-10-01 NOTE — Telephone Encounter (Signed)
Attempted to call Mimi today. Left voicemail. Will retry.

## 2018-10-02 NOTE — Telephone Encounter (Signed)
I spoke with Mimi, reviewed the autopsy results with her. She is going to try to get a copy from Dr Renold Genta if that is possible - I believe she should be able to get a copy of his medical records. She notes that when Dr Melvyn Novas signed the death certificate he indicated that there was no autopsy. Not sure whether we need to get the certificate amended, but I can look in to this.

## 2018-10-30 ENCOUNTER — Telehealth: Payer: Self-pay | Admitting: Emergency Medicine

## 2018-10-30 NOTE — Telephone Encounter (Signed)
I spoke with Luis Cisneros - she noted that she had not called. I did confirm with her that the info we had reviewed last month was based on the final post-mortem report. She didn't have any other questions at this time.

## 2018-10-30 NOTE — Telephone Encounter (Signed)
Patient wife is returning the call. CB is [phone].

## 2018-10-30 NOTE — Telephone Encounter (Signed)
Returned patient's call, LMOM that I would try her back.

## 2018-11-14 ENCOUNTER — Other Ambulatory Visit: Payer: Medicare Other | Admitting: Internal Medicine

## 2018-11-18 ENCOUNTER — Encounter: Payer: Medicare Other | Admitting: Internal Medicine

## 2018-12-14 ENCOUNTER — Other Ambulatory Visit: Payer: Self-pay | Admitting: Oncology
